# Patient Record
Sex: Female | Born: 1935 | Race: White | Hispanic: No | Marital: Married | Smoking: Former smoker
Health system: Southern US, Community
[De-identification: ages and names within clinical notes are randomized; demographics above are authoritative.]

## PROBLEM LIST (undated history)

## (undated) DIAGNOSIS — F419 Anxiety disorder, unspecified: Secondary | ICD-10-CM

## (undated) DIAGNOSIS — T508X5A Adverse effect of diagnostic agents, initial encounter: Secondary | ICD-10-CM

## (undated) DIAGNOSIS — I509 Heart failure, unspecified: Secondary | ICD-10-CM

## (undated) DIAGNOSIS — IMO0001 Reserved for inherently not codable concepts without codable children: Secondary | ICD-10-CM

## (undated) DIAGNOSIS — Z8719 Personal history of other diseases of the digestive system: Secondary | ICD-10-CM

## (undated) DIAGNOSIS — I10 Essential (primary) hypertension: Secondary | ICD-10-CM

## (undated) DIAGNOSIS — D649 Anemia, unspecified: Secondary | ICD-10-CM

## (undated) DIAGNOSIS — I219 Acute myocardial infarction, unspecified: Secondary | ICD-10-CM

## (undated) DIAGNOSIS — F329 Major depressive disorder, single episode, unspecified: Secondary | ICD-10-CM

## (undated) DIAGNOSIS — K219 Gastro-esophageal reflux disease without esophagitis: Secondary | ICD-10-CM

## (undated) DIAGNOSIS — F32A Depression, unspecified: Secondary | ICD-10-CM

## (undated) DIAGNOSIS — I2510 Atherosclerotic heart disease of native coronary artery without angina pectoris: Secondary | ICD-10-CM

## (undated) DIAGNOSIS — J069 Acute upper respiratory infection, unspecified: Secondary | ICD-10-CM

## (undated) DIAGNOSIS — M8580 Other specified disorders of bone density and structure, unspecified site: Secondary | ICD-10-CM

## (undated) DIAGNOSIS — R0602 Shortness of breath: Secondary | ICD-10-CM

## (undated) DIAGNOSIS — I2699 Other pulmonary embolism without acute cor pulmonale: Secondary | ICD-10-CM

## (undated) DIAGNOSIS — K551 Chronic vascular disorders of intestine: Secondary | ICD-10-CM

## (undated) DIAGNOSIS — I82409 Acute embolism and thrombosis of unspecified deep veins of unspecified lower extremity: Secondary | ICD-10-CM

## (undated) DIAGNOSIS — N141 Nephropathy induced by other drugs, medicaments and biological substances: Secondary | ICD-10-CM

## (undated) DIAGNOSIS — I480 Paroxysmal atrial fibrillation: Secondary | ICD-10-CM

## (undated) DIAGNOSIS — J189 Pneumonia, unspecified organism: Secondary | ICD-10-CM

## (undated) DIAGNOSIS — M1990 Unspecified osteoarthritis, unspecified site: Secondary | ICD-10-CM

## (undated) DIAGNOSIS — R51 Headache: Secondary | ICD-10-CM

## (undated) DIAGNOSIS — I499 Cardiac arrhythmia, unspecified: Secondary | ICD-10-CM

## (undated) DIAGNOSIS — K279 Peptic ulcer, site unspecified, unspecified as acute or chronic, without hemorrhage or perforation: Secondary | ICD-10-CM

## (undated) DIAGNOSIS — F99 Mental disorder, not otherwise specified: Secondary | ICD-10-CM

## (undated) DIAGNOSIS — K5790 Diverticulosis of intestine, part unspecified, without perforation or abscess without bleeding: Secondary | ICD-10-CM

## (undated) DIAGNOSIS — E785 Hyperlipidemia, unspecified: Secondary | ICD-10-CM

## (undated) DIAGNOSIS — I951 Orthostatic hypotension: Secondary | ICD-10-CM

## (undated) DIAGNOSIS — Z9889 Other specified postprocedural states: Secondary | ICD-10-CM

## (undated) DIAGNOSIS — I209 Angina pectoris, unspecified: Secondary | ICD-10-CM

## (undated) DIAGNOSIS — R112 Nausea with vomiting, unspecified: Secondary | ICD-10-CM

## (undated) DIAGNOSIS — I701 Atherosclerosis of renal artery: Secondary | ICD-10-CM

## (undated) DIAGNOSIS — Z5189 Encounter for other specified aftercare: Secondary | ICD-10-CM

## (undated) DIAGNOSIS — R011 Cardiac murmur, unspecified: Secondary | ICD-10-CM

## (undated) DIAGNOSIS — I150 Renovascular hypertension: Secondary | ICD-10-CM

## (undated) DIAGNOSIS — N1411 Contrast-induced nephropathy: Secondary | ICD-10-CM

## (undated) DIAGNOSIS — J449 Chronic obstructive pulmonary disease, unspecified: Secondary | ICD-10-CM

## (undated) DIAGNOSIS — I739 Peripheral vascular disease, unspecified: Secondary | ICD-10-CM

## (undated) HISTORY — DX: Chronic vascular disorders of intestine: K55.1

## (undated) HISTORY — DX: Chronic obstructive pulmonary disease, unspecified: J44.9

## (undated) HISTORY — PX: BREAST SURGERY: SHX581

## (undated) HISTORY — DX: Gastro-esophageal reflux disease without esophagitis: K21.9

## (undated) HISTORY — PX: TONSILLECTOMY: SUR1361

## (undated) HISTORY — DX: Orthostatic hypotension: I95.1

## (undated) HISTORY — DX: Paroxysmal atrial fibrillation: I48.0

## (undated) HISTORY — DX: Hyperlipidemia, unspecified: E78.5

## (undated) HISTORY — PX: CHOLECYSTECTOMY: SHX55

## (undated) HISTORY — DX: Personal history of other diseases of the digestive system: Z87.19

## (undated) HISTORY — DX: Atherosclerotic heart disease of native coronary artery without angina pectoris: I25.10

## (undated) HISTORY — DX: Nephropathy induced by other drugs, medicaments and biological substances: N14.1

## (undated) HISTORY — PX: APPENDECTOMY: SHX54

## (undated) HISTORY — DX: Acute embolism and thrombosis of unspecified deep veins of unspecified lower extremity: I82.409

## (undated) HISTORY — DX: Adverse effect of diagnostic agents, initial encounter: T50.8X5A

## (undated) HISTORY — DX: Other pulmonary embolism without acute cor pulmonale: I26.99

## (undated) HISTORY — DX: Renovascular hypertension: I15.0

## (undated) HISTORY — PX: ABDOMINAL HYSTERECTOMY: SHX81

## (undated) HISTORY — DX: Peripheral vascular disease, unspecified: I73.9

## (undated) HISTORY — PX: NM MYOVIEW LTD: HXRAD82

## (undated) HISTORY — DX: Contrast-induced nephropathy: N14.11

## (undated) HISTORY — DX: Peptic ulcer, site unspecified, unspecified as acute or chronic, without hemorrhage or perforation: K27.9

## (undated) HISTORY — PX: ESOPHAGOGASTRODUODENOSCOPY: SHX1529

## (undated) HISTORY — DX: Other specified disorders of bone density and structure, unspecified site: M85.80

## (undated) HISTORY — DX: Diverticulosis of intestine, part unspecified, without perforation or abscess without bleeding: K57.90

## (undated) HISTORY — DX: Atherosclerosis of renal artery: I70.1

## (undated) HISTORY — DX: Essential (primary) hypertension: I10

## (undated) HISTORY — PX: HERNIA REPAIR: SHX51

---

## 1995-04-02 HISTORY — PX: ABDOMINAL AORTIC ANEURYSM REPAIR: SUR1152

## 1997-06-28 ENCOUNTER — Encounter (HOSPITAL_COMMUNITY): Admission: RE | Admit: 1997-06-28 | Discharge: 1997-09-26 | Payer: Self-pay | Admitting: Internal Medicine

## 1997-10-24 ENCOUNTER — Encounter (HOSPITAL_COMMUNITY): Admission: RE | Admit: 1997-10-24 | Discharge: 1998-01-22 | Payer: Self-pay | Admitting: Internal Medicine

## 1998-09-27 ENCOUNTER — Encounter: Payer: Self-pay | Admitting: General Surgery

## 1998-09-29 ENCOUNTER — Observation Stay (HOSPITAL_COMMUNITY): Admission: RE | Admit: 1998-09-29 | Discharge: 1998-09-30 | Payer: Self-pay | Admitting: General Surgery

## 1998-09-29 ENCOUNTER — Encounter: Payer: Self-pay | Admitting: General Surgery

## 1998-09-29 ENCOUNTER — Encounter (INDEPENDENT_AMBULATORY_CARE_PROVIDER_SITE_OTHER): Payer: Self-pay | Admitting: Specialist

## 1999-04-02 HISTORY — PX: CATARACT EXTRACTION, BILATERAL: SHX1313

## 1999-10-15 ENCOUNTER — Encounter: Admission: RE | Admit: 1999-10-15 | Discharge: 1999-10-15 | Payer: Self-pay | Admitting: Internal Medicine

## 1999-10-15 ENCOUNTER — Encounter: Payer: Self-pay | Admitting: Internal Medicine

## 2000-03-05 ENCOUNTER — Ambulatory Visit (HOSPITAL_COMMUNITY): Admission: RE | Admit: 2000-03-05 | Discharge: 2000-03-05 | Payer: Self-pay | Admitting: Ophthalmology

## 2000-04-26 ENCOUNTER — Encounter: Payer: Self-pay | Admitting: Internal Medicine

## 2000-04-26 ENCOUNTER — Encounter: Admission: RE | Admit: 2000-04-26 | Discharge: 2000-04-26 | Payer: Self-pay | Admitting: Internal Medicine

## 2001-08-30 ENCOUNTER — Inpatient Hospital Stay (HOSPITAL_COMMUNITY): Admission: EM | Admit: 2001-08-30 | Discharge: 2001-09-12 | Payer: Self-pay

## 2001-09-03 HISTORY — PX: CARDIAC CATHETERIZATION: SHX172

## 2001-09-04 HISTORY — PX: CORONARY ARTERY BYPASS GRAFT: SHX141

## 2001-09-05 ENCOUNTER — Encounter: Payer: Self-pay | Admitting: Cardiothoracic Surgery

## 2001-09-06 ENCOUNTER — Encounter: Payer: Self-pay | Admitting: Cardiothoracic Surgery

## 2001-09-07 ENCOUNTER — Encounter: Payer: Self-pay | Admitting: Cardiothoracic Surgery

## 2001-09-08 ENCOUNTER — Encounter: Payer: Self-pay | Admitting: Cardiothoracic Surgery

## 2002-04-20 ENCOUNTER — Encounter (INDEPENDENT_AMBULATORY_CARE_PROVIDER_SITE_OTHER): Payer: Self-pay | Admitting: Internal Medicine

## 2002-04-20 ENCOUNTER — Other Ambulatory Visit: Admission: RE | Admit: 2002-04-20 | Discharge: 2002-04-20 | Payer: Self-pay | Admitting: Family Medicine

## 2002-12-09 ENCOUNTER — Encounter: Admission: RE | Admit: 2002-12-09 | Discharge: 2002-12-09 | Payer: Self-pay | Admitting: Family Medicine

## 2002-12-09 ENCOUNTER — Encounter: Payer: Self-pay | Admitting: Family Medicine

## 2003-01-05 ENCOUNTER — Encounter: Payer: Self-pay | Admitting: Neurological Surgery

## 2003-01-05 ENCOUNTER — Encounter: Admission: RE | Admit: 2003-01-05 | Discharge: 2003-01-05 | Payer: Self-pay | Admitting: Neurological Surgery

## 2003-11-21 ENCOUNTER — Encounter: Admission: RE | Admit: 2003-11-21 | Discharge: 2003-11-21 | Payer: Self-pay | Admitting: Family Medicine

## 2004-02-15 ENCOUNTER — Ambulatory Visit: Payer: Self-pay | Admitting: Family Medicine

## 2004-02-27 ENCOUNTER — Ambulatory Visit: Payer: Self-pay | Admitting: Cardiology

## 2004-02-27 ENCOUNTER — Ambulatory Visit: Payer: Self-pay | Admitting: Internal Medicine

## 2004-03-12 ENCOUNTER — Ambulatory Visit: Payer: Self-pay | Admitting: Family Medicine

## 2004-03-13 ENCOUNTER — Ambulatory Visit: Payer: Self-pay

## 2004-03-27 ENCOUNTER — Ambulatory Visit: Payer: Self-pay | Admitting: Internal Medicine

## 2004-04-11 ENCOUNTER — Ambulatory Visit: Payer: Self-pay | Admitting: Family Medicine

## 2004-04-24 ENCOUNTER — Ambulatory Visit: Payer: Self-pay | Admitting: Family Medicine

## 2004-05-07 ENCOUNTER — Ambulatory Visit: Payer: Self-pay | Admitting: Family Medicine

## 2004-05-14 ENCOUNTER — Ambulatory Visit: Payer: Self-pay | Admitting: Family Medicine

## 2004-05-21 ENCOUNTER — Ambulatory Visit: Payer: Self-pay | Admitting: Family Medicine

## 2004-06-07 ENCOUNTER — Ambulatory Visit: Payer: Self-pay | Admitting: Family Medicine

## 2004-06-11 ENCOUNTER — Ambulatory Visit: Payer: Self-pay | Admitting: Family Medicine

## 2004-06-26 ENCOUNTER — Ambulatory Visit: Payer: Self-pay

## 2004-06-27 ENCOUNTER — Ambulatory Visit: Payer: Self-pay | Admitting: *Deleted

## 2004-06-28 ENCOUNTER — Ambulatory Visit (HOSPITAL_COMMUNITY): Admission: RE | Admit: 2004-06-28 | Discharge: 2004-06-28 | Payer: Self-pay | Admitting: Vascular Surgery

## 2004-07-05 ENCOUNTER — Ambulatory Visit: Payer: Self-pay | Admitting: Family Medicine

## 2004-07-25 ENCOUNTER — Ambulatory Visit: Payer: Self-pay | Admitting: Family Medicine

## 2004-08-22 ENCOUNTER — Ambulatory Visit: Payer: Self-pay | Admitting: Family Medicine

## 2004-09-11 ENCOUNTER — Ambulatory Visit: Payer: Self-pay | Admitting: Family Medicine

## 2004-09-19 ENCOUNTER — Ambulatory Visit: Payer: Self-pay | Admitting: Family Medicine

## 2004-10-03 ENCOUNTER — Ambulatory Visit: Payer: Self-pay | Admitting: Family Medicine

## 2004-10-17 ENCOUNTER — Ambulatory Visit: Payer: Self-pay | Admitting: Family Medicine

## 2004-11-19 ENCOUNTER — Ambulatory Visit: Payer: Self-pay | Admitting: Family Medicine

## 2004-12-12 ENCOUNTER — Encounter (INDEPENDENT_AMBULATORY_CARE_PROVIDER_SITE_OTHER): Payer: Self-pay | Admitting: Internal Medicine

## 2004-12-12 ENCOUNTER — Ambulatory Visit: Payer: Self-pay | Admitting: Family Medicine

## 2004-12-12 LAB — CONVERTED CEMR LAB
Hgb A1c MFr Bld: 6 %
Microalbumin U total vol: 13.3 mg/L

## 2004-12-15 ENCOUNTER — Emergency Department (HOSPITAL_COMMUNITY): Admission: EM | Admit: 2004-12-15 | Discharge: 2004-12-15 | Payer: Self-pay | Admitting: Family Medicine

## 2004-12-17 ENCOUNTER — Ambulatory Visit: Payer: Self-pay | Admitting: Family Medicine

## 2004-12-21 ENCOUNTER — Ambulatory Visit: Payer: Self-pay | Admitting: Family Medicine

## 2005-01-14 ENCOUNTER — Ambulatory Visit: Payer: Self-pay | Admitting: Family Medicine

## 2005-01-16 ENCOUNTER — Ambulatory Visit: Payer: Self-pay | Admitting: Family Medicine

## 2005-01-28 ENCOUNTER — Ambulatory Visit: Payer: Self-pay | Admitting: Family Medicine

## 2005-02-11 ENCOUNTER — Ambulatory Visit: Payer: Self-pay | Admitting: Family Medicine

## 2005-02-25 ENCOUNTER — Ambulatory Visit: Payer: Self-pay | Admitting: Family Medicine

## 2005-03-05 ENCOUNTER — Ambulatory Visit: Payer: Self-pay | Admitting: Cardiology

## 2005-03-11 ENCOUNTER — Ambulatory Visit: Payer: Self-pay | Admitting: Family Medicine

## 2005-03-12 ENCOUNTER — Ambulatory Visit: Payer: Self-pay | Admitting: Family Medicine

## 2005-04-08 ENCOUNTER — Ambulatory Visit: Payer: Self-pay | Admitting: Family Medicine

## 2005-05-08 ENCOUNTER — Ambulatory Visit: Payer: Self-pay | Admitting: Family Medicine

## 2005-05-22 ENCOUNTER — Ambulatory Visit: Payer: Self-pay | Admitting: Family Medicine

## 2005-06-05 ENCOUNTER — Ambulatory Visit: Payer: Self-pay | Admitting: Family Medicine

## 2005-06-05 ENCOUNTER — Encounter (INDEPENDENT_AMBULATORY_CARE_PROVIDER_SITE_OTHER): Payer: Self-pay | Admitting: Internal Medicine

## 2005-06-05 LAB — CONVERTED CEMR LAB: Microalbumin U total vol: 50.6 mg/L

## 2005-06-10 ENCOUNTER — Ambulatory Visit: Payer: Self-pay | Admitting: Family Medicine

## 2005-06-19 ENCOUNTER — Ambulatory Visit: Payer: Self-pay | Admitting: Family Medicine

## 2005-07-02 ENCOUNTER — Ambulatory Visit: Payer: Self-pay | Admitting: Family Medicine

## 2005-07-16 ENCOUNTER — Ambulatory Visit: Payer: Self-pay | Admitting: Family Medicine

## 2005-07-31 ENCOUNTER — Ambulatory Visit: Payer: Self-pay | Admitting: Family Medicine

## 2005-08-14 ENCOUNTER — Ambulatory Visit: Payer: Self-pay | Admitting: Family Medicine

## 2005-09-02 ENCOUNTER — Ambulatory Visit: Payer: Self-pay | Admitting: Family Medicine

## 2005-09-02 ENCOUNTER — Encounter: Admission: RE | Admit: 2005-09-02 | Discharge: 2005-09-02 | Payer: Self-pay | Admitting: Family Medicine

## 2005-09-11 ENCOUNTER — Encounter (INDEPENDENT_AMBULATORY_CARE_PROVIDER_SITE_OTHER): Payer: Self-pay | Admitting: Internal Medicine

## 2005-09-11 ENCOUNTER — Ambulatory Visit: Payer: Self-pay | Admitting: Family Medicine

## 2005-09-13 ENCOUNTER — Ambulatory Visit: Payer: Self-pay | Admitting: Family Medicine

## 2005-09-25 ENCOUNTER — Ambulatory Visit: Payer: Self-pay | Admitting: Family Medicine

## 2005-10-14 ENCOUNTER — Ambulatory Visit: Payer: Self-pay | Admitting: Family Medicine

## 2005-10-23 ENCOUNTER — Ambulatory Visit: Payer: Self-pay | Admitting: Family Medicine

## 2005-10-30 ENCOUNTER — Encounter (INDEPENDENT_AMBULATORY_CARE_PROVIDER_SITE_OTHER): Payer: Self-pay | Admitting: Internal Medicine

## 2005-10-30 ENCOUNTER — Ambulatory Visit: Payer: Self-pay | Admitting: Family Medicine

## 2005-10-30 LAB — CONVERTED CEMR LAB: Microalbumin U total vol: 3.7 mg/L

## 2005-11-13 ENCOUNTER — Ambulatory Visit: Payer: Self-pay | Admitting: Family Medicine

## 2005-12-11 ENCOUNTER — Ambulatory Visit: Payer: Self-pay | Admitting: Family Medicine

## 2005-12-13 ENCOUNTER — Ambulatory Visit: Payer: Self-pay | Admitting: Family Medicine

## 2005-12-17 ENCOUNTER — Encounter: Admission: RE | Admit: 2005-12-17 | Discharge: 2005-12-17 | Payer: Self-pay | Admitting: Family Medicine

## 2006-01-08 ENCOUNTER — Ambulatory Visit: Payer: Self-pay | Admitting: Internal Medicine

## 2006-01-08 ENCOUNTER — Ambulatory Visit: Payer: Self-pay | Admitting: Family Medicine

## 2006-01-12 ENCOUNTER — Emergency Department (HOSPITAL_COMMUNITY): Admission: EM | Admit: 2006-01-12 | Discharge: 2006-01-12 | Payer: Self-pay | Admitting: Family Medicine

## 2006-01-15 ENCOUNTER — Ambulatory Visit: Payer: Self-pay | Admitting: Family Medicine

## 2006-01-15 ENCOUNTER — Inpatient Hospital Stay (HOSPITAL_COMMUNITY): Admission: EM | Admit: 2006-01-15 | Discharge: 2006-01-25 | Payer: Self-pay | Admitting: Emergency Medicine

## 2006-01-15 ENCOUNTER — Ambulatory Visit: Payer: Self-pay | Admitting: Cardiology

## 2006-01-16 ENCOUNTER — Encounter: Payer: Self-pay | Admitting: Cardiology

## 2006-01-16 ENCOUNTER — Encounter: Payer: Self-pay | Admitting: Vascular Surgery

## 2006-01-20 ENCOUNTER — Encounter: Payer: Self-pay | Admitting: Vascular Surgery

## 2006-02-04 ENCOUNTER — Ambulatory Visit: Payer: Self-pay | Admitting: Family Medicine

## 2006-02-06 ENCOUNTER — Ambulatory Visit: Payer: Self-pay | Admitting: Cardiology

## 2006-02-12 ENCOUNTER — Ambulatory Visit: Payer: Self-pay | Admitting: Gastroenterology

## 2006-02-13 ENCOUNTER — Ambulatory Visit: Payer: Self-pay | Admitting: Family Medicine

## 2006-02-17 ENCOUNTER — Ambulatory Visit: Payer: Self-pay | Admitting: Family Medicine

## 2006-02-18 ENCOUNTER — Ambulatory Visit: Payer: Self-pay | Admitting: Gastroenterology

## 2006-02-18 LAB — CONVERTED CEMR LAB
Fecal Occult Blood: NEGATIVE
OCCULT 1: NEGATIVE
OCCULT 2: NEGATIVE

## 2006-02-25 ENCOUNTER — Ambulatory Visit: Payer: Self-pay | Admitting: Cardiology

## 2006-02-27 ENCOUNTER — Ambulatory Visit: Payer: Self-pay

## 2006-03-05 ENCOUNTER — Ambulatory Visit: Payer: Self-pay | Admitting: Family Medicine

## 2006-03-06 ENCOUNTER — Ambulatory Visit: Payer: Self-pay | Admitting: Cardiovascular Disease

## 2006-03-13 ENCOUNTER — Encounter (INDEPENDENT_AMBULATORY_CARE_PROVIDER_SITE_OTHER): Payer: Self-pay | Admitting: *Deleted

## 2006-03-13 ENCOUNTER — Ambulatory Visit: Payer: Self-pay | Admitting: Gastroenterology

## 2006-03-17 ENCOUNTER — Ambulatory Visit: Payer: Self-pay | Admitting: Family Medicine

## 2006-03-17 ENCOUNTER — Encounter (INDEPENDENT_AMBULATORY_CARE_PROVIDER_SITE_OTHER): Payer: Self-pay | Admitting: Internal Medicine

## 2006-03-27 ENCOUNTER — Ambulatory Visit: Payer: Self-pay | Admitting: Family Medicine

## 2006-04-14 ENCOUNTER — Ambulatory Visit: Payer: Self-pay | Admitting: Cardiology

## 2006-04-14 LAB — CONVERTED CEMR LAB
Hemoglobin: 10.5 g/dL — ABNORMAL LOW (ref 12.0–15.0)
MCHC: 32.7 g/dL (ref 30.0–36.0)
MCV: 81.3 fL (ref 78.0–100.0)
Platelets: 249 10*3/uL (ref 150–400)
RBC: 3.95 M/uL (ref 3.87–5.11)

## 2006-04-15 ENCOUNTER — Emergency Department (HOSPITAL_COMMUNITY): Admission: EM | Admit: 2006-04-15 | Discharge: 2006-04-15 | Payer: Self-pay | Admitting: Emergency Medicine

## 2006-04-18 ENCOUNTER — Ambulatory Visit: Payer: Self-pay | Admitting: Cardiology

## 2006-04-18 LAB — CONVERTED CEMR LAB
CO2: 30 meq/L (ref 19–32)
Calcium: 8.9 mg/dL (ref 8.4–10.5)
GFR calc Af Amer: 79 mL/min
Glucose, Bld: 103 mg/dL — ABNORMAL HIGH (ref 70–99)

## 2006-04-21 ENCOUNTER — Encounter: Payer: Self-pay | Admitting: Cardiology

## 2006-04-21 ENCOUNTER — Ambulatory Visit: Payer: Self-pay

## 2006-04-24 ENCOUNTER — Ambulatory Visit: Payer: Self-pay | Admitting: Family Medicine

## 2006-04-24 LAB — CONVERTED CEMR LAB: Prothrombin Time: 31.2 s (ref 10.0–14.0)

## 2006-05-02 ENCOUNTER — Ambulatory Visit: Payer: Self-pay | Admitting: Family Medicine

## 2006-05-08 ENCOUNTER — Ambulatory Visit: Payer: Self-pay | Admitting: Cardiology

## 2006-05-08 LAB — CONVERTED CEMR LAB
Chloride: 103 meq/L (ref 96–112)
Creatinine, Ser: 0.7 mg/dL (ref 0.4–1.2)
Pro B Natriuretic peptide (BNP): 278 pg/mL — ABNORMAL HIGH (ref 0.0–100.0)
Sodium: 140 meq/L (ref 135–145)

## 2006-05-09 ENCOUNTER — Ambulatory Visit: Payer: Self-pay | Admitting: Family Medicine

## 2006-05-16 ENCOUNTER — Ambulatory Visit: Payer: Self-pay | Admitting: Cardiology

## 2006-05-16 LAB — CONVERTED CEMR LAB
BUN: 11 mg/dL (ref 6–23)
Chloride: 102 meq/L (ref 96–112)
Creatinine, Ser: 0.7 mg/dL (ref 0.4–1.2)
Sodium: 142 meq/L (ref 135–145)

## 2006-06-06 ENCOUNTER — Ambulatory Visit: Payer: Self-pay | Admitting: Family Medicine

## 2006-06-16 ENCOUNTER — Ambulatory Visit: Payer: Self-pay | Admitting: Family Medicine

## 2006-06-16 ENCOUNTER — Encounter (INDEPENDENT_AMBULATORY_CARE_PROVIDER_SITE_OTHER): Payer: Self-pay | Admitting: Internal Medicine

## 2006-06-16 LAB — CONVERTED CEMR LAB
Eosinophils Absolute: 0.2 10*3/uL (ref 0.0–0.6)
Eosinophils Relative: 2.7 % (ref 0.0–5.0)
GFR calc Af Amer: 126 mL/min
GFR calc non Af Amer: 104 mL/min
Glucose, Bld: 95 mg/dL (ref 70–99)
HCT: 37.8 % (ref 36.0–46.0)
Hemoglobin: 12.4 g/dL (ref 12.0–15.0)
Hgb A1c MFr Bld: 5.5 %
Hgb A1c MFr Bld: 5.5 % (ref 4.6–6.0)
Lymphocytes Relative: 20.2 % (ref 12.0–46.0)
MCV: 78.8 fL (ref 78.0–100.0)
Microalbumin U total vol: 1.2 mg/L
Neutro Abs: 4.4 10*3/uL (ref 1.4–7.7)
Neutrophils Relative %: 70 % (ref 43.0–77.0)
Sodium: 143 meq/L (ref 135–145)
WBC: 6.3 10*3/uL (ref 4.5–10.5)

## 2006-06-25 ENCOUNTER — Encounter (INDEPENDENT_AMBULATORY_CARE_PROVIDER_SITE_OTHER): Payer: Self-pay | Admitting: Internal Medicine

## 2006-06-25 DIAGNOSIS — E785 Hyperlipidemia, unspecified: Secondary | ICD-10-CM

## 2006-06-25 DIAGNOSIS — K279 Peptic ulcer, site unspecified, unspecified as acute or chronic, without hemorrhage or perforation: Secondary | ICD-10-CM | POA: Insufficient documentation

## 2006-06-25 DIAGNOSIS — E119 Type 2 diabetes mellitus without complications: Secondary | ICD-10-CM

## 2006-06-25 DIAGNOSIS — Z86718 Personal history of other venous thrombosis and embolism: Secondary | ICD-10-CM | POA: Insufficient documentation

## 2006-06-25 DIAGNOSIS — M899 Disorder of bone, unspecified: Secondary | ICD-10-CM | POA: Insufficient documentation

## 2006-06-25 DIAGNOSIS — I10 Essential (primary) hypertension: Secondary | ICD-10-CM | POA: Insufficient documentation

## 2006-06-25 DIAGNOSIS — I739 Peripheral vascular disease, unspecified: Secondary | ICD-10-CM | POA: Insufficient documentation

## 2006-06-25 DIAGNOSIS — M949 Disorder of cartilage, unspecified: Secondary | ICD-10-CM

## 2006-07-04 ENCOUNTER — Ambulatory Visit: Payer: Self-pay | Admitting: Family Medicine

## 2006-07-22 ENCOUNTER — Encounter: Payer: Self-pay | Admitting: *Deleted

## 2006-07-22 ENCOUNTER — Ambulatory Visit: Payer: Self-pay | Admitting: Internal Medicine

## 2006-07-30 ENCOUNTER — Ambulatory Visit: Payer: Self-pay | Admitting: Family Medicine

## 2006-07-30 LAB — CONVERTED CEMR LAB: Prothrombin Time: 23.7 s

## 2006-08-06 ENCOUNTER — Ambulatory Visit: Payer: Self-pay | Admitting: Family Medicine

## 2006-08-08 ENCOUNTER — Ambulatory Visit: Payer: Self-pay | Admitting: Family Medicine

## 2006-08-12 ENCOUNTER — Ambulatory Visit: Payer: Self-pay | Admitting: Cardiology

## 2006-08-12 LAB — CONVERTED CEMR LAB
Cholesterol: 87 mg/dL (ref 0–200)
HDL: 38.2 mg/dL — ABNORMAL LOW (ref >39.0)
LDL Cholesterol: 30 mg/dL (ref 0–99)

## 2006-08-13 ENCOUNTER — Ambulatory Visit: Payer: Self-pay | Admitting: Internal Medicine

## 2006-08-15 ENCOUNTER — Ambulatory Visit: Payer: Self-pay | Admitting: Internal Medicine

## 2006-08-15 LAB — CONVERTED CEMR LAB
INR: 2.6
Prothrombin Time: 19.5 s

## 2006-08-18 LAB — CONVERTED CEMR LAB: Prothrombin Time: 26.3 s

## 2006-08-19 ENCOUNTER — Telehealth (INDEPENDENT_AMBULATORY_CARE_PROVIDER_SITE_OTHER): Payer: Self-pay | Admitting: Internal Medicine

## 2006-08-20 ENCOUNTER — Ambulatory Visit: Payer: Self-pay | Admitting: Cardiology

## 2006-08-20 LAB — CONVERTED CEMR LAB
CO2: 33 meq/L — ABNORMAL HIGH (ref 19–32)
Calcium: 9.2 mg/dL (ref 8.4–10.5)
GFR calc Af Amer: 90 mL/min
GFR calc non Af Amer: 75 mL/min
Glucose, Bld: 87 mg/dL (ref 70–99)
Potassium: 4.7 meq/L (ref 3.5–5.1)
Sodium: 142 meq/L (ref 135–145)

## 2006-08-22 ENCOUNTER — Ambulatory Visit: Payer: Self-pay | Admitting: Internal Medicine

## 2006-09-03 ENCOUNTER — Encounter (INDEPENDENT_AMBULATORY_CARE_PROVIDER_SITE_OTHER): Payer: Self-pay | Admitting: *Deleted

## 2006-09-05 ENCOUNTER — Ambulatory Visit: Payer: Self-pay | Admitting: Family Medicine

## 2006-09-05 LAB — CONVERTED CEMR LAB: Prothrombin Time: 19.5 s

## 2006-09-09 ENCOUNTER — Encounter: Admission: RE | Admit: 2006-09-09 | Discharge: 2006-09-09 | Payer: Self-pay | Admitting: Family Medicine

## 2006-09-09 ENCOUNTER — Encounter (INDEPENDENT_AMBULATORY_CARE_PROVIDER_SITE_OTHER): Payer: Self-pay | Admitting: Internal Medicine

## 2006-09-10 ENCOUNTER — Encounter (INDEPENDENT_AMBULATORY_CARE_PROVIDER_SITE_OTHER): Payer: Self-pay | Admitting: Internal Medicine

## 2006-09-22 ENCOUNTER — Ambulatory Visit: Payer: Self-pay | Admitting: Family Medicine

## 2006-09-23 ENCOUNTER — Encounter (INDEPENDENT_AMBULATORY_CARE_PROVIDER_SITE_OTHER): Payer: Self-pay | Admitting: Internal Medicine

## 2006-10-02 ENCOUNTER — Ambulatory Visit: Payer: Self-pay | Admitting: Family Medicine

## 2006-10-02 ENCOUNTER — Ambulatory Visit: Payer: Self-pay | Admitting: Cardiology

## 2006-10-08 LAB — CONVERTED CEMR LAB
Basophils Relative: 0.2 % (ref 0.0–1.0)
CO2: 29 meq/L (ref 19–32)
Eosinophils Absolute: 0.1 10*3/uL (ref 0.0–0.6)
Eosinophils Relative: 1.2 % (ref 0.0–5.0)
GFR calc Af Amer: 105 mL/min
GFR calc non Af Amer: 87 mL/min
Glucose, Bld: 102 mg/dL — ABNORMAL HIGH (ref 70–99)
HCT: 35.7 % — ABNORMAL LOW (ref 36.0–46.0)
Hemoglobin: 11.7 g/dL — ABNORMAL LOW (ref 12.0–15.0)
Lymphocytes Relative: 19.9 % (ref 12.0–46.0)
MCV: 81.5 fL (ref 78.0–100.0)
Monocytes Absolute: 0.5 10*3/uL (ref 0.2–0.7)
Neutro Abs: 4 10*3/uL (ref 1.4–7.7)
Neutrophils Relative %: 70.6 % (ref 43.0–77.0)
Prothrombin Time: 17.7 s — ABNORMAL HIGH (ref 10.0–14.0)
Sodium: 140 meq/L (ref 135–145)
WBC: 5.7 10*3/uL (ref 4.5–10.5)

## 2006-10-14 ENCOUNTER — Telehealth (INDEPENDENT_AMBULATORY_CARE_PROVIDER_SITE_OTHER): Payer: Self-pay | Admitting: *Deleted

## 2006-10-15 ENCOUNTER — Ambulatory Visit: Payer: Self-pay | Admitting: Family Medicine

## 2006-10-15 DIAGNOSIS — K5732 Diverticulitis of large intestine without perforation or abscess without bleeding: Secondary | ICD-10-CM

## 2006-10-16 ENCOUNTER — Ambulatory Visit: Payer: Self-pay | Admitting: Otolaryngology

## 2006-10-17 ENCOUNTER — Telehealth (INDEPENDENT_AMBULATORY_CARE_PROVIDER_SITE_OTHER): Payer: Self-pay | Admitting: Internal Medicine

## 2006-10-31 ENCOUNTER — Ambulatory Visit: Payer: Self-pay | Admitting: Family Medicine

## 2006-11-11 ENCOUNTER — Ambulatory Visit: Payer: Self-pay | Admitting: Internal Medicine

## 2006-11-12 ENCOUNTER — Encounter (INDEPENDENT_AMBULATORY_CARE_PROVIDER_SITE_OTHER): Payer: Self-pay | Admitting: Internal Medicine

## 2006-11-18 LAB — CONVERTED CEMR LAB
Basophils Absolute: 0 10*3/uL (ref 0.0–0.1)
Chloride: 103 meq/L (ref 96–112)
Eosinophils Absolute: 0.2 10*3/uL (ref 0.0–0.6)
Eosinophils Relative: 3 % (ref 0.0–5.0)
GFR calc Af Amer: 90 mL/min
GFR calc non Af Amer: 75 mL/min
Glucose, Bld: 101 mg/dL — ABNORMAL HIGH (ref 70–99)
HCT: 36.8 % (ref 36.0–46.0)
Hgb A1c MFr Bld: 5.1 % (ref 4.6–6.0)
Lymphocytes Relative: 23.3 % (ref 12.0–46.0)
MCHC: 34.7 g/dL (ref 30.0–36.0)
MCV: 80.8 fL (ref 78.0–100.0)
Neutro Abs: 4.6 10*3/uL (ref 1.4–7.7)
Neutrophils Relative %: 64 % (ref 43.0–77.0)
Platelets: 251 10*3/uL (ref 150–400)
RBC: 4.55 M/uL (ref 3.87–5.11)
Sodium: 142 meq/L (ref 135–145)
WBC: 7.1 10*3/uL (ref 4.5–10.5)

## 2006-11-19 ENCOUNTER — Ambulatory Visit: Payer: Self-pay | Admitting: Cardiology

## 2006-12-02 ENCOUNTER — Ambulatory Visit: Payer: Self-pay | Admitting: Family Medicine

## 2006-12-08 ENCOUNTER — Ambulatory Visit: Payer: Self-pay | Admitting: Family Medicine

## 2006-12-16 ENCOUNTER — Ambulatory Visit: Payer: Self-pay | Admitting: Family Medicine

## 2006-12-30 ENCOUNTER — Ambulatory Visit: Payer: Self-pay | Admitting: Family Medicine

## 2006-12-31 LAB — CONVERTED CEMR LAB
INR: 2.2 — ABNORMAL HIGH (ref 0.8–1.0)
Prothrombin Time: 18.3 s — ABNORMAL HIGH (ref 10.9–13.3)

## 2007-01-19 ENCOUNTER — Ambulatory Visit: Payer: Self-pay | Admitting: Internal Medicine

## 2007-01-27 ENCOUNTER — Ambulatory Visit: Payer: Self-pay | Admitting: Family Medicine

## 2007-01-27 LAB — CONVERTED CEMR LAB
INR: 1.6
Prothrombin Time: 15.6 s

## 2007-02-10 ENCOUNTER — Ambulatory Visit: Payer: Self-pay | Admitting: Family Medicine

## 2007-02-10 LAB — CONVERTED CEMR LAB: INR: 1.8

## 2007-03-09 ENCOUNTER — Ambulatory Visit: Payer: Self-pay | Admitting: Family Medicine

## 2007-03-10 ENCOUNTER — Ambulatory Visit: Payer: Self-pay | Admitting: Cardiology

## 2007-03-11 LAB — CONVERTED CEMR LAB
ALT: 14 units/L (ref 0–35)
BUN: 9 mg/dL (ref 6–23)
CO2: 34 meq/L — ABNORMAL HIGH (ref 19–32)
Calcium: 9.1 mg/dL (ref 8.4–10.5)
GFR calc Af Amer: 105 mL/min
GFR calc non Af Amer: 87 mL/min
Hgb A1c MFr Bld: 5.6 % (ref 4.6–6.0)
LDL Cholesterol: 47 mg/dL (ref 0–99)
Potassium: 3.6 meq/L (ref 3.5–5.1)
Total CHOL/HDL Ratio: 1.9
Triglycerides: 52 mg/dL (ref 0–149)
VLDL: 10 mg/dL (ref 0–40)

## 2007-03-19 ENCOUNTER — Ambulatory Visit: Payer: Self-pay

## 2007-04-06 ENCOUNTER — Ambulatory Visit: Payer: Self-pay | Admitting: Internal Medicine

## 2007-04-06 LAB — CONVERTED CEMR LAB: Prothrombin Time: 19.5 s

## 2007-04-09 ENCOUNTER — Inpatient Hospital Stay (HOSPITAL_COMMUNITY): Admission: EM | Admit: 2007-04-09 | Discharge: 2007-04-15 | Payer: Self-pay | Admitting: Family Medicine

## 2007-04-09 ENCOUNTER — Ambulatory Visit: Payer: Self-pay | Admitting: Cardiology

## 2007-04-09 LAB — CONVERTED CEMR LAB
Basophils Relative: 0.1 % (ref 0.0–1.0)
CO2: 27 meq/L (ref 19–32)
Calcium: 9.4 mg/dL (ref 8.4–10.5)
Chloride: 103 meq/L (ref 96–112)
Eosinophils Relative: 0.5 % (ref 0.0–5.0)
Glucose, Bld: 122 mg/dL — ABNORMAL HIGH (ref 70–99)
HCT: 24.4 % — ABNORMAL LOW (ref 36.0–46.0)
Lymphocytes Relative: 9.9 % — ABNORMAL LOW (ref 12.0–46.0)
Neutro Abs: 6.8 10*3/uL (ref 1.4–7.7)
Neutrophils Relative %: 84.7 % — ABNORMAL HIGH (ref 43.0–77.0)
Platelets: 197 10*3/uL (ref 150–400)
Prothrombin Time: 21.9 s — ABNORMAL HIGH (ref 10.9–13.3)
RBC: 3.14 M/uL — ABNORMAL LOW (ref 3.87–5.11)
TSH: 2.59 microintl units/mL (ref 0.35–5.50)
WBC: 8 10*3/uL (ref 4.5–10.5)

## 2007-04-13 ENCOUNTER — Ambulatory Visit: Payer: Self-pay | Admitting: Internal Medicine

## 2007-04-15 ENCOUNTER — Encounter (INDEPENDENT_AMBULATORY_CARE_PROVIDER_SITE_OTHER): Payer: Self-pay | Admitting: Internal Medicine

## 2007-04-15 DIAGNOSIS — M109 Gout, unspecified: Secondary | ICD-10-CM

## 2007-04-23 ENCOUNTER — Encounter (INDEPENDENT_AMBULATORY_CARE_PROVIDER_SITE_OTHER): Payer: Self-pay | Admitting: Internal Medicine

## 2007-04-23 ENCOUNTER — Ambulatory Visit: Payer: Self-pay | Admitting: Family Medicine

## 2007-04-30 ENCOUNTER — Ambulatory Visit: Payer: Self-pay

## 2007-04-30 ENCOUNTER — Ambulatory Visit: Payer: Self-pay | Admitting: Cardiology

## 2007-04-30 LAB — CONVERTED CEMR LAB
Basophils Relative: 0 % (ref 0.0–1.0)
CO2: 32 meq/L (ref 19–32)
Eosinophils Relative: 2.6 % (ref 0.0–5.0)
GFR calc Af Amer: 47 mL/min
Glucose, Bld: 104 mg/dL — ABNORMAL HIGH (ref 70–99)
Hemoglobin: 11.3 g/dL — ABNORMAL LOW (ref 12.0–15.0)
Lymphocytes Relative: 19 % (ref 12.0–46.0)
Monocytes Absolute: 0.2 10*3/uL (ref 0.2–0.7)
Neutro Abs: 5.3 10*3/uL (ref 1.4–7.7)
Neutrophils Relative %: 74.9 % (ref 43.0–77.0)
Potassium: 4.2 meq/L (ref 3.5–5.1)
WBC: 7 10*3/uL (ref 4.5–10.5)

## 2007-05-04 ENCOUNTER — Ambulatory Visit: Payer: Self-pay | Admitting: Internal Medicine

## 2007-05-08 ENCOUNTER — Ambulatory Visit: Payer: Self-pay | Admitting: Family Medicine

## 2007-05-08 LAB — CONVERTED CEMR LAB: INR: 1.6

## 2007-05-15 ENCOUNTER — Ambulatory Visit: Payer: Self-pay | Admitting: Family Medicine

## 2007-05-15 LAB — CONVERTED CEMR LAB

## 2007-05-18 ENCOUNTER — Telehealth (INDEPENDENT_AMBULATORY_CARE_PROVIDER_SITE_OTHER): Payer: Self-pay | Admitting: Internal Medicine

## 2007-05-19 ENCOUNTER — Ambulatory Visit: Payer: Self-pay | Admitting: Family Medicine

## 2007-05-20 ENCOUNTER — Encounter (INDEPENDENT_AMBULATORY_CARE_PROVIDER_SITE_OTHER): Payer: Self-pay | Admitting: Internal Medicine

## 2007-05-21 LAB — CONVERTED CEMR LAB
Basophils Relative: 2 % — ABNORMAL HIGH (ref 0.0–1.0)
CO2: 32 meq/L (ref 19–32)
Calcium: 9.5 mg/dL (ref 8.4–10.5)
Creatinine, Ser: 1.7 mg/dL — ABNORMAL HIGH (ref 0.4–1.2)
Eosinophils Relative: 2.6 % (ref 0.0–5.0)
GFR calc Af Amer: 38 mL/min
Glucose, Bld: 123 mg/dL — ABNORMAL HIGH (ref 70–99)
Hemoglobin: 11.9 g/dL — ABNORMAL LOW (ref 12.0–15.0)
Lymphocytes Relative: 20.4 % (ref 12.0–46.0)
Neutro Abs: 5.4 10*3/uL (ref 1.4–7.7)
Platelets: 142 10*3/uL — ABNORMAL LOW (ref 150–400)
WBC: 7.7 10*3/uL (ref 4.5–10.5)

## 2007-05-29 ENCOUNTER — Ambulatory Visit: Payer: Self-pay | Admitting: Family Medicine

## 2007-05-29 LAB — CONVERTED CEMR LAB
INR: 1
Prothrombin Time: 12.5 s

## 2007-06-05 ENCOUNTER — Encounter (INDEPENDENT_AMBULATORY_CARE_PROVIDER_SITE_OTHER): Payer: Self-pay | Admitting: Internal Medicine

## 2007-06-08 ENCOUNTER — Encounter: Admission: RE | Admit: 2007-06-08 | Discharge: 2007-06-08 | Payer: Self-pay | Admitting: Family Medicine

## 2007-06-08 ENCOUNTER — Ambulatory Visit: Payer: Self-pay | Admitting: Family Medicine

## 2007-06-08 LAB — CONVERTED CEMR LAB
INR: 1.9
Prothrombin Time: 16.8 s

## 2007-06-10 ENCOUNTER — Telehealth (INDEPENDENT_AMBULATORY_CARE_PROVIDER_SITE_OTHER): Payer: Self-pay | Admitting: Internal Medicine

## 2007-06-16 ENCOUNTER — Ambulatory Visit: Payer: Self-pay | Admitting: Cardiology

## 2007-06-16 ENCOUNTER — Encounter: Payer: Self-pay | Admitting: Family Medicine

## 2007-06-16 ENCOUNTER — Telehealth: Payer: Self-pay | Admitting: Family Medicine

## 2007-06-16 ENCOUNTER — Encounter (INDEPENDENT_AMBULATORY_CARE_PROVIDER_SITE_OTHER): Payer: Self-pay | Admitting: Internal Medicine

## 2007-06-17 ENCOUNTER — Encounter (INDEPENDENT_AMBULATORY_CARE_PROVIDER_SITE_OTHER): Payer: Self-pay | Admitting: Internal Medicine

## 2007-06-23 ENCOUNTER — Telehealth (INDEPENDENT_AMBULATORY_CARE_PROVIDER_SITE_OTHER): Payer: Self-pay | Admitting: Internal Medicine

## 2007-06-26 ENCOUNTER — Inpatient Hospital Stay (HOSPITAL_COMMUNITY): Admission: EM | Admit: 2007-06-26 | Discharge: 2007-07-03 | Payer: Self-pay | Admitting: Emergency Medicine

## 2007-06-26 ENCOUNTER — Ambulatory Visit: Payer: Self-pay | Admitting: Internal Medicine

## 2007-06-30 ENCOUNTER — Ambulatory Visit: Payer: Self-pay | Admitting: Internal Medicine

## 2007-06-30 ENCOUNTER — Encounter (INDEPENDENT_AMBULATORY_CARE_PROVIDER_SITE_OTHER): Payer: Self-pay | Admitting: Internal Medicine

## 2007-06-30 ENCOUNTER — Encounter: Payer: Self-pay | Admitting: Internal Medicine

## 2007-07-01 ENCOUNTER — Encounter (INDEPENDENT_AMBULATORY_CARE_PROVIDER_SITE_OTHER): Payer: Self-pay | Admitting: Internal Medicine

## 2007-07-01 HISTORY — PX: VENA CAVA FILTER PLACEMENT: SHX1085

## 2007-07-02 ENCOUNTER — Ambulatory Visit: Payer: Self-pay | Admitting: Internal Medicine

## 2007-07-03 ENCOUNTER — Encounter (INDEPENDENT_AMBULATORY_CARE_PROVIDER_SITE_OTHER): Payer: Self-pay | Admitting: Internal Medicine

## 2007-07-08 ENCOUNTER — Ambulatory Visit: Payer: Self-pay | Admitting: Family Medicine

## 2007-07-08 LAB — CONVERTED CEMR LAB
Basophils Absolute: 0 10*3/uL (ref 0.0–0.1)
Chloride: 103 meq/L (ref 96–112)
Eosinophils Absolute: 0.1 10*3/uL (ref 0.0–0.7)
GFR calc Af Amer: 79 mL/min
GFR calc non Af Amer: 65 mL/min
MCHC: 32.3 g/dL (ref 30.0–36.0)
MCV: 87 fL (ref 78.0–100.0)
Neutrophils Relative %: 74.2 % (ref 43.0–77.0)
Platelets: 188 10*3/uL (ref 150–400)
Potassium: 4 meq/L (ref 3.5–5.1)
RDW: 16 % — ABNORMAL HIGH (ref 11.5–14.6)

## 2007-07-14 ENCOUNTER — Ambulatory Visit: Payer: Self-pay | Admitting: Family Medicine

## 2007-07-14 LAB — CONVERTED CEMR LAB

## 2007-07-21 ENCOUNTER — Ambulatory Visit: Payer: Self-pay | Admitting: Internal Medicine

## 2007-07-22 ENCOUNTER — Ambulatory Visit: Payer: Self-pay | Admitting: Family Medicine

## 2007-07-22 DIAGNOSIS — IMO0002 Reserved for concepts with insufficient information to code with codable children: Secondary | ICD-10-CM

## 2007-07-22 DIAGNOSIS — F329 Major depressive disorder, single episode, unspecified: Secondary | ICD-10-CM

## 2007-07-22 DIAGNOSIS — E1142 Type 2 diabetes mellitus with diabetic polyneuropathy: Secondary | ICD-10-CM | POA: Insufficient documentation

## 2007-07-23 ENCOUNTER — Telehealth (INDEPENDENT_AMBULATORY_CARE_PROVIDER_SITE_OTHER): Payer: Self-pay | Admitting: Internal Medicine

## 2007-07-23 LAB — CONVERTED CEMR LAB
Albumin: 3.5 g/dL (ref 3.5–5.2)
BUN: 11 mg/dL (ref 6–23)
Calcium: 9 mg/dL (ref 8.4–10.5)
GFR calc non Af Amer: 65 mL/min
Hgb A1c MFr Bld: 5.4 % (ref 4.6–6.0)
Phosphorus: 3.9 mg/dL (ref 2.3–4.6)
Potassium: 3.8 meq/L (ref 3.5–5.1)
Sodium: 145 meq/L (ref 135–145)

## 2007-07-24 ENCOUNTER — Ambulatory Visit: Payer: Self-pay

## 2007-07-24 ENCOUNTER — Encounter (INDEPENDENT_AMBULATORY_CARE_PROVIDER_SITE_OTHER): Payer: Self-pay | Admitting: Internal Medicine

## 2007-07-27 ENCOUNTER — Encounter: Payer: Self-pay | Admitting: Family Medicine

## 2007-07-27 ENCOUNTER — Telehealth (INDEPENDENT_AMBULATORY_CARE_PROVIDER_SITE_OTHER): Payer: Self-pay | Admitting: Internal Medicine

## 2007-07-27 ENCOUNTER — Ambulatory Visit: Payer: Self-pay | Admitting: Internal Medicine

## 2007-08-03 ENCOUNTER — Ambulatory Visit: Payer: Self-pay | Admitting: Internal Medicine

## 2007-08-03 ENCOUNTER — Ambulatory Visit: Payer: Self-pay | Admitting: Cardiology

## 2007-08-06 ENCOUNTER — Encounter: Payer: Self-pay | Admitting: Family Medicine

## 2007-08-10 ENCOUNTER — Ambulatory Visit: Payer: Self-pay | Admitting: Family Medicine

## 2007-08-10 LAB — CONVERTED CEMR LAB
Ketones, urine, test strip: NEGATIVE
Nitrite: NEGATIVE
Protein, U semiquant: NEGATIVE
Urobilinogen, UA: 0.2

## 2007-08-11 ENCOUNTER — Encounter (INDEPENDENT_AMBULATORY_CARE_PROVIDER_SITE_OTHER): Payer: Self-pay | Admitting: Internal Medicine

## 2007-08-17 ENCOUNTER — Encounter (INDEPENDENT_AMBULATORY_CARE_PROVIDER_SITE_OTHER): Payer: Self-pay | Admitting: Internal Medicine

## 2007-08-17 ENCOUNTER — Ambulatory Visit: Payer: Self-pay

## 2007-08-25 ENCOUNTER — Ambulatory Visit: Payer: Self-pay | Admitting: Family Medicine

## 2007-08-25 LAB — CONVERTED CEMR LAB
Bilirubin Urine: NEGATIVE
Blood in Urine, dipstick: NEGATIVE
Glucose, Urine, Semiquant: NEGATIVE
Protein, U semiquant: NEGATIVE
Specific Gravity, Urine: 1.01
Urobilinogen, UA: 0.2

## 2007-09-16 ENCOUNTER — Encounter: Payer: Self-pay | Admitting: Family Medicine

## 2007-09-24 ENCOUNTER — Telehealth (INDEPENDENT_AMBULATORY_CARE_PROVIDER_SITE_OTHER): Payer: Self-pay | Admitting: Internal Medicine

## 2007-10-01 ENCOUNTER — Encounter (INDEPENDENT_AMBULATORY_CARE_PROVIDER_SITE_OTHER): Payer: Self-pay | Admitting: Internal Medicine

## 2007-10-13 ENCOUNTER — Ambulatory Visit: Payer: Self-pay | Admitting: Family Medicine

## 2007-10-30 ENCOUNTER — Ambulatory Visit: Payer: Self-pay | Admitting: Cardiology

## 2007-10-30 ENCOUNTER — Ambulatory Visit: Payer: Self-pay | Admitting: Cardiovascular Disease

## 2007-10-30 LAB — CONVERTED CEMR LAB
Alkaline Phosphatase: 109 units/L (ref 39–117)
Basophils Absolute: 0 10*3/uL (ref 0.0–0.1)
Bilirubin, Direct: 0.1 mg/dL (ref 0.0–0.3)
GFR calc Af Amer: 79 mL/min
Glucose, Bld: 116 mg/dL — ABNORMAL HIGH (ref 70–99)
HDL: 49.9 mg/dL (ref >39.0)
LDL Cholesterol: 79 mg/dL (ref 0–99)
Lymphocytes Relative: 20.7 % (ref 12.0–46.0)
MCHC: 33.7 g/dL (ref 30.0–36.0)
Monocytes Absolute: 0.3 10*3/uL (ref 0.1–1.0)
Monocytes Relative: 5.6 % (ref 3.0–12.0)
Platelets: 152 10*3/uL (ref 150–400)
Potassium: 4.1 meq/L (ref 3.5–5.1)
RDW: 15.1 % — ABNORMAL HIGH (ref 11.5–14.6)
Sodium: 142 meq/L (ref 135–145)
Total Bilirubin: 1 mg/dL (ref 0.3–1.2)
Total CHOL/HDL Ratio: 3
Total Protein: 7.2 g/dL (ref 6.0–8.3)
Triglycerides: 101 mg/dL (ref 0–149)

## 2007-11-18 ENCOUNTER — Ambulatory Visit: Payer: Self-pay | Admitting: Internal Medicine

## 2007-11-18 ENCOUNTER — Inpatient Hospital Stay (HOSPITAL_COMMUNITY): Admission: EM | Admit: 2007-11-18 | Discharge: 2007-11-24 | Payer: Self-pay | Admitting: Emergency Medicine

## 2007-12-02 ENCOUNTER — Ambulatory Visit: Payer: Self-pay | Admitting: Cardiology

## 2007-12-02 LAB — CONVERTED CEMR LAB
Basophils Absolute: 0.1 10*3/uL (ref 0.0–0.1)
Basophils Relative: 0.8 % (ref 0.0–3.0)
Calcium: 9.3 mg/dL (ref 8.4–10.5)
Creatinine, Ser: 1.1 mg/dL (ref 0.4–1.2)
Eosinophils Absolute: 0.1 10*3/uL (ref 0.0–0.7)
GFR calc Af Amer: 62 mL/min
GFR calc non Af Amer: 52 mL/min
HCT: 37.7 % (ref 36.0–46.0)
Hemoglobin: 13 g/dL (ref 12.0–15.0)
Iron: 45 ug/dL (ref 42–145)
Lymphocytes Relative: 19.8 % (ref 12.0–46.0)
MCHC: 34.5 g/dL (ref 30.0–36.0)
MCV: 82.7 fL (ref 78.0–100.0)
Monocytes Absolute: 0.5 10*3/uL (ref 0.1–1.0)
Neutro Abs: 6 10*3/uL (ref 1.4–7.7)
RBC: 4.57 M/uL (ref 3.87–5.11)
RDW: 15.7 % — ABNORMAL HIGH (ref 11.5–14.6)
Sodium: 142 meq/L (ref 135–145)
Transferrin: 236.7 mg/dL (ref >=212.0)

## 2007-12-15 ENCOUNTER — Ambulatory Visit: Payer: Self-pay | Admitting: Cardiology

## 2007-12-28 ENCOUNTER — Ambulatory Visit: Payer: Self-pay | Admitting: Family Medicine

## 2008-02-10 DIAGNOSIS — I2510 Atherosclerotic heart disease of native coronary artery without angina pectoris: Secondary | ICD-10-CM | POA: Insufficient documentation

## 2008-02-10 DIAGNOSIS — I4891 Unspecified atrial fibrillation: Secondary | ICD-10-CM

## 2008-02-10 DIAGNOSIS — I5033 Acute on chronic diastolic (congestive) heart failure: Secondary | ICD-10-CM

## 2008-02-10 DIAGNOSIS — I701 Atherosclerosis of renal artery: Secondary | ICD-10-CM | POA: Insufficient documentation

## 2008-03-16 ENCOUNTER — Inpatient Hospital Stay (HOSPITAL_COMMUNITY): Admission: EM | Admit: 2008-03-16 | Discharge: 2008-03-20 | Payer: Self-pay | Admitting: Emergency Medicine

## 2008-03-16 ENCOUNTER — Ambulatory Visit: Payer: Self-pay | Admitting: Cardiology

## 2008-04-07 ENCOUNTER — Ambulatory Visit: Payer: Self-pay | Admitting: Cardiology

## 2008-04-07 LAB — CONVERTED CEMR LAB
BUN: 24 mg/dL — ABNORMAL HIGH (ref 6–23)
CO2: 32 meq/L (ref 19–32)
Calcium: 8.9 mg/dL (ref 8.4–10.5)
GFR calc Af Amer: 41 mL/min
Glucose, Bld: 89 mg/dL (ref 70–99)
Sodium: 145 meq/L (ref 135–145)

## 2008-04-11 ENCOUNTER — Ambulatory Visit: Payer: Self-pay | Admitting: Nurse Practitioner

## 2008-04-11 LAB — CONVERTED CEMR LAB
BUN: 25 mg/dL — ABNORMAL HIGH (ref 6–23)
CO2: 33 meq/L — ABNORMAL HIGH (ref 19–32)
Calcium: 8.7 mg/dL (ref 8.4–10.5)
GFR calc Af Amer: 41 mL/min
Glucose, Bld: 111 mg/dL — ABNORMAL HIGH (ref 70–99)
Sodium: 141 meq/L (ref 135–145)

## 2008-04-14 ENCOUNTER — Encounter: Payer: Self-pay | Admitting: Cardiology

## 2008-04-14 ENCOUNTER — Ambulatory Visit: Payer: Self-pay

## 2008-04-18 ENCOUNTER — Telehealth: Payer: Self-pay | Admitting: Family Medicine

## 2008-04-21 ENCOUNTER — Ambulatory Visit: Payer: Self-pay | Admitting: Internal Medicine

## 2008-04-21 ENCOUNTER — Ambulatory Visit: Payer: Self-pay | Admitting: Cardiology

## 2008-04-21 LAB — CONVERTED CEMR LAB
BUN: 28 mg/dL — ABNORMAL HIGH (ref 6–23)
CO2: 32 meq/L (ref 19–32)
Calcium: 9 mg/dL (ref 8.4–10.5)
Glucose, Bld: 100 mg/dL — ABNORMAL HIGH (ref 70–99)
Potassium: 4.4 meq/L (ref 3.5–5.1)
Sodium: 144 meq/L (ref 135–145)

## 2008-05-05 ENCOUNTER — Ambulatory Visit: Payer: Self-pay | Admitting: Cardiology

## 2008-05-05 LAB — CONVERTED CEMR LAB
BUN: 14 mg/dL (ref 6–23)
CO2: 33 meq/L — ABNORMAL HIGH (ref 19–32)
Glucose, Bld: 92 mg/dL (ref 70–99)
Potassium: 3.7 meq/L (ref 3.5–5.1)
Sodium: 142 meq/L (ref 135–145)

## 2008-05-18 ENCOUNTER — Ambulatory Visit: Payer: Self-pay

## 2008-05-20 ENCOUNTER — Ambulatory Visit: Payer: Self-pay

## 2008-05-20 ENCOUNTER — Ambulatory Visit: Payer: Self-pay | Admitting: Cardiology

## 2008-05-24 ENCOUNTER — Ambulatory Visit: Payer: Self-pay | Admitting: Internal Medicine

## 2008-05-26 ENCOUNTER — Telehealth (INDEPENDENT_AMBULATORY_CARE_PROVIDER_SITE_OTHER): Payer: Self-pay | Admitting: Internal Medicine

## 2008-05-26 ENCOUNTER — Encounter (INDEPENDENT_AMBULATORY_CARE_PROVIDER_SITE_OTHER): Payer: Self-pay | Admitting: Internal Medicine

## 2008-05-27 ENCOUNTER — Emergency Department (HOSPITAL_COMMUNITY): Admission: EM | Admit: 2008-05-27 | Discharge: 2008-05-28 | Payer: Self-pay | Admitting: Emergency Medicine

## 2008-05-29 ENCOUNTER — Encounter: Admission: RE | Admit: 2008-05-29 | Discharge: 2008-05-29 | Payer: Self-pay | Admitting: Family Medicine

## 2008-05-31 ENCOUNTER — Telehealth (INDEPENDENT_AMBULATORY_CARE_PROVIDER_SITE_OTHER): Payer: Self-pay | Admitting: Internal Medicine

## 2008-06-01 ENCOUNTER — Ambulatory Visit: Payer: Self-pay | Admitting: Cardiovascular Disease

## 2008-06-01 LAB — CONVERTED CEMR LAB
Creatinine,U: 28.5 mg/dL
Hgb A1c MFr Bld: 5.6 % (ref 4.6–6.0)
Microalb Creat Ratio: 101.8 mg/g — ABNORMAL HIGH (ref 0.0–30.0)
Microalb, Ur: 2.9 mg/dL — ABNORMAL HIGH (ref 0.0–1.9)
Sed Rate: 19 mm/hr (ref 0–22)

## 2008-06-02 ENCOUNTER — Telehealth (INDEPENDENT_AMBULATORY_CARE_PROVIDER_SITE_OTHER): Payer: Self-pay | Admitting: Internal Medicine

## 2008-06-10 ENCOUNTER — Telehealth (INDEPENDENT_AMBULATORY_CARE_PROVIDER_SITE_OTHER): Payer: Self-pay | Admitting: *Deleted

## 2008-06-17 ENCOUNTER — Ambulatory Visit: Payer: Self-pay | Admitting: Family Medicine

## 2008-06-19 LAB — CONVERTED CEMR LAB
AST: 22 units/L (ref 0–37)
Alkaline Phosphatase: 92 units/L (ref 39–117)
Basophils Absolute: 0 10*3/uL (ref 0.0–0.1)
Bilirubin, Direct: 0 mg/dL (ref 0.0–0.3)
CO2: 33 meq/L — ABNORMAL HIGH (ref 19–32)
Calcium: 8.9 mg/dL (ref 8.4–10.5)
Chloride: 104 meq/L (ref 96–112)
Lymphocytes Relative: 19.5 % (ref 12.0–46.0)
Lymphs Abs: 1.2 10*3/uL (ref 0.7–4.0)
Monocytes Relative: 5.6 % (ref 3.0–12.0)
Neutrophils Relative %: 73.4 % (ref 43.0–77.0)
Platelets: 133 10*3/uL — ABNORMAL LOW (ref 150.0–400.0)
Potassium: 4.4 meq/L (ref 3.5–5.1)
RDW: 15.9 % — ABNORMAL HIGH (ref 11.5–14.6)
Sodium: 144 meq/L (ref 135–145)
Total Protein: 6.4 g/dL (ref 6.0–8.3)
WBC: 6 10*3/uL (ref 4.5–10.5)

## 2008-06-20 ENCOUNTER — Encounter (INDEPENDENT_AMBULATORY_CARE_PROVIDER_SITE_OTHER): Payer: Self-pay | Admitting: Internal Medicine

## 2008-06-21 ENCOUNTER — Encounter: Payer: Self-pay | Admitting: Cardiology

## 2008-06-21 ENCOUNTER — Ambulatory Visit: Payer: Self-pay | Admitting: Cardiology

## 2008-06-27 ENCOUNTER — Encounter: Payer: Self-pay | Admitting: Internal Medicine

## 2008-06-27 ENCOUNTER — Ambulatory Visit: Payer: Self-pay | Admitting: Internal Medicine

## 2008-06-27 ENCOUNTER — Inpatient Hospital Stay (HOSPITAL_COMMUNITY): Admission: EM | Admit: 2008-06-27 | Discharge: 2008-07-04 | Payer: Self-pay | Admitting: Emergency Medicine

## 2008-06-27 ENCOUNTER — Encounter (INDEPENDENT_AMBULATORY_CARE_PROVIDER_SITE_OTHER): Payer: Self-pay | Admitting: Internal Medicine

## 2008-06-27 DIAGNOSIS — G43009 Migraine without aura, not intractable, without status migrainosus: Secondary | ICD-10-CM | POA: Insufficient documentation

## 2008-07-18 ENCOUNTER — Inpatient Hospital Stay (HOSPITAL_COMMUNITY): Admission: EM | Admit: 2008-07-18 | Discharge: 2008-07-21 | Payer: Self-pay | Admitting: Emergency Medicine

## 2008-07-18 ENCOUNTER — Ambulatory Visit: Payer: Self-pay | Admitting: Internal Medicine

## 2008-07-21 ENCOUNTER — Encounter (INDEPENDENT_AMBULATORY_CARE_PROVIDER_SITE_OTHER): Payer: Self-pay | Admitting: Internal Medicine

## 2008-07-22 ENCOUNTER — Ambulatory Visit: Payer: Self-pay | Admitting: Cardiology

## 2008-08-03 ENCOUNTER — Encounter: Payer: Self-pay | Admitting: Cardiology

## 2008-08-10 ENCOUNTER — Encounter: Payer: Self-pay | Admitting: Cardiology

## 2008-08-23 ENCOUNTER — Ambulatory Visit: Payer: Self-pay | Admitting: Internal Medicine

## 2008-08-24 LAB — CONVERTED CEMR LAB
BUN: 27 mg/dL — ABNORMAL HIGH (ref 6–23)
GFR calc non Af Amer: 35.97 mL/min (ref >60)
Glucose, Bld: 96 mg/dL (ref 70–99)
Potassium: 5.2 meq/L — ABNORMAL HIGH (ref 3.5–5.1)

## 2008-08-30 ENCOUNTER — Ambulatory Visit: Payer: Self-pay | Admitting: Nurse Practitioner

## 2008-08-30 ENCOUNTER — Ambulatory Visit: Payer: Self-pay | Admitting: Cardiology

## 2008-09-27 ENCOUNTER — Encounter (INDEPENDENT_AMBULATORY_CARE_PROVIDER_SITE_OTHER): Payer: Self-pay | Admitting: Internal Medicine

## 2008-09-27 ENCOUNTER — Telehealth: Payer: Self-pay | Admitting: Cardiology

## 2008-10-07 ENCOUNTER — Ambulatory Visit: Payer: Self-pay | Admitting: Internal Medicine

## 2008-10-07 LAB — CONVERTED CEMR LAB: Hgb A1c MFr Bld: 5.3 % (ref 4.6–6.5)

## 2008-10-31 ENCOUNTER — Ambulatory Visit: Payer: Self-pay | Admitting: Cardiology

## 2008-11-07 ENCOUNTER — Ambulatory Visit: Payer: Self-pay | Admitting: Cardiology

## 2008-11-07 LAB — CONVERTED CEMR LAB
Calcium: 8.6 mg/dL (ref 8.4–10.5)
GFR calc non Af Amer: 51.41 mL/min (ref >60)
Sodium: 148 meq/L — ABNORMAL HIGH (ref 135–145)

## 2008-11-17 ENCOUNTER — Ambulatory Visit: Payer: Self-pay

## 2008-11-17 ENCOUNTER — Encounter: Payer: Self-pay | Admitting: Cardiovascular Disease

## 2008-11-17 HISTORY — PX: OTHER SURGICAL HISTORY: SHX169

## 2008-11-22 ENCOUNTER — Ambulatory Visit: Payer: Self-pay | Admitting: Family Medicine

## 2008-11-24 ENCOUNTER — Ambulatory Visit: Payer: Self-pay | Admitting: Family Medicine

## 2008-12-02 ENCOUNTER — Ambulatory Visit: Payer: Self-pay | Admitting: Family Medicine

## 2008-12-02 ENCOUNTER — Telehealth: Payer: Self-pay | Admitting: Cardiology

## 2008-12-12 ENCOUNTER — Telehealth (INDEPENDENT_AMBULATORY_CARE_PROVIDER_SITE_OTHER): Payer: Self-pay | Admitting: Internal Medicine

## 2008-12-13 ENCOUNTER — Ambulatory Visit: Payer: Self-pay | Admitting: Family Medicine

## 2008-12-14 ENCOUNTER — Ambulatory Visit: Payer: Self-pay | Admitting: Family Medicine

## 2008-12-20 ENCOUNTER — Ambulatory Visit: Payer: Self-pay | Admitting: Family Medicine

## 2008-12-20 LAB — CONVERTED CEMR LAB
ALT: 14 units/L (ref 0–35)
BUN: 16 mg/dL (ref 6–23)
Basophils Relative: 0.5 % (ref 0.0–3.0)
Chloride: 104 meq/L (ref 96–112)
Creatinine, Ser: 1.1 mg/dL (ref 0.4–1.2)
Eosinophils Absolute: 0.1 10*3/uL (ref 0.0–0.7)
Glucose, Bld: 90 mg/dL (ref 70–99)
HDL: 40.7 mg/dL (ref >39.00)
Hemoglobin: 10 g/dL — ABNORMAL LOW (ref 12.0–15.0)
Lymphocytes Relative: 21.3 % (ref 12.0–46.0)
MCHC: 32.4 g/dL (ref 30.0–36.0)
MCV: 84.2 fL (ref 78.0–100.0)
Neutro Abs: 3.8 10*3/uL (ref 1.4–7.7)
RBC: 3.67 M/uL — ABNORMAL LOW (ref 3.87–5.11)
Triglycerides: 75 mg/dL (ref 0.0–149.0)

## 2008-12-26 ENCOUNTER — Ambulatory Visit: Payer: Self-pay | Admitting: Cardiovascular Disease

## 2008-12-30 ENCOUNTER — Ambulatory Visit: Payer: Self-pay | Admitting: Family Medicine

## 2009-01-05 ENCOUNTER — Ambulatory Visit: Payer: Self-pay | Admitting: Family Medicine

## 2009-01-05 LAB — CONVERTED CEMR LAB: OCCULT 1: NEGATIVE

## 2009-01-06 ENCOUNTER — Encounter: Payer: Self-pay | Admitting: Family Medicine

## 2009-02-14 ENCOUNTER — Encounter: Payer: Self-pay | Admitting: Cardiology

## 2009-02-20 ENCOUNTER — Ambulatory Visit: Payer: Self-pay | Admitting: Family Medicine

## 2009-02-28 ENCOUNTER — Encounter (INDEPENDENT_AMBULATORY_CARE_PROVIDER_SITE_OTHER): Payer: Self-pay | Admitting: Internal Medicine

## 2009-02-28 ENCOUNTER — Ambulatory Visit: Payer: Self-pay | Admitting: Family Medicine

## 2009-03-06 ENCOUNTER — Ambulatory Visit: Payer: Self-pay | Admitting: Cardiology

## 2009-03-10 ENCOUNTER — Encounter: Payer: Self-pay | Admitting: Cardiology

## 2009-03-10 LAB — CONVERTED CEMR LAB
BUN: 14 mg/dL (ref 6–23)
Basophils Absolute: 0.1 10*3/uL (ref 0.0–0.1)
Basophils Relative: 0.8 % (ref 0.0–3.0)
CO2: 33 meq/L — ABNORMAL HIGH (ref 19–32)
Chloride: 101 meq/L (ref 96–112)
Eosinophils Absolute: 0.2 10*3/uL (ref 0.0–0.7)
GFR calc non Af Amer: 57.34 mL/min (ref >60)
Glucose, Bld: 98 mg/dL (ref 70–99)
HCT: 34.7 % — ABNORMAL LOW (ref 36.0–46.0)
Hemoglobin: 11.5 g/dL — ABNORMAL LOW (ref 12.0–15.0)
Lymphs Abs: 1.3 10*3/uL (ref 0.7–4.0)
MCHC: 33.2 g/dL (ref 30.0–36.0)
MCV: 85.5 fL (ref 78.0–100.0)
Monocytes Absolute: 0.5 10*3/uL (ref 0.1–1.0)
Neutro Abs: 4.3 10*3/uL (ref 1.4–7.7)
Potassium: 4.1 meq/L (ref 3.5–5.1)
RDW: 14.4 % (ref 11.5–14.6)

## 2009-03-21 ENCOUNTER — Telehealth: Payer: Self-pay | Admitting: Cardiology

## 2009-03-22 ENCOUNTER — Ambulatory Visit: Payer: Self-pay | Admitting: Cardiology

## 2009-03-30 ENCOUNTER — Telehealth: Payer: Self-pay | Admitting: Cardiology

## 2009-03-30 LAB — CONVERTED CEMR LAB
BUN: 19 mg/dL (ref 6–23)
Calcium: 8.6 mg/dL (ref 8.4–10.5)
Creatinine, Ser: 1.1 mg/dL (ref 0.4–1.2)
Pro B Natriuretic peptide (BNP): 285 pg/mL — ABNORMAL HIGH (ref 0.0–100.0)

## 2009-04-03 ENCOUNTER — Encounter: Payer: Self-pay | Admitting: Cardiology

## 2009-04-03 ENCOUNTER — Telehealth (INDEPENDENT_AMBULATORY_CARE_PROVIDER_SITE_OTHER): Payer: Self-pay | Admitting: *Deleted

## 2009-04-05 ENCOUNTER — Telehealth (INDEPENDENT_AMBULATORY_CARE_PROVIDER_SITE_OTHER): Payer: Self-pay | Admitting: Internal Medicine

## 2009-04-05 ENCOUNTER — Telehealth: Payer: Self-pay | Admitting: Cardiology

## 2009-04-13 ENCOUNTER — Ambulatory Visit: Payer: Self-pay | Admitting: Family Medicine

## 2009-04-19 ENCOUNTER — Encounter: Payer: Self-pay | Admitting: Family Medicine

## 2009-04-20 ENCOUNTER — Encounter: Payer: Self-pay | Admitting: Family Medicine

## 2009-04-21 ENCOUNTER — Ambulatory Visit: Payer: Self-pay | Admitting: Family Medicine

## 2009-05-14 ENCOUNTER — Emergency Department (HOSPITAL_COMMUNITY): Admission: EM | Admit: 2009-05-14 | Discharge: 2009-05-14 | Payer: Self-pay | Admitting: Emergency Medicine

## 2009-05-24 ENCOUNTER — Ambulatory Visit: Payer: Self-pay | Admitting: Family Medicine

## 2009-06-19 ENCOUNTER — Ambulatory Visit: Payer: Self-pay | Admitting: Family Medicine

## 2009-06-21 ENCOUNTER — Encounter: Payer: Self-pay | Admitting: Family Medicine

## 2009-06-23 ENCOUNTER — Encounter: Payer: Self-pay | Admitting: Family Medicine

## 2009-07-05 ENCOUNTER — Ambulatory Visit: Payer: Self-pay | Admitting: Cardiology

## 2009-07-06 ENCOUNTER — Encounter: Payer: Self-pay | Admitting: Family Medicine

## 2009-07-11 ENCOUNTER — Ambulatory Visit: Payer: Self-pay | Admitting: Cardiovascular Disease

## 2009-07-11 LAB — CONVERTED CEMR LAB
Calcium: 8.6 mg/dL (ref 8.4–10.5)
Creatinine, Ser: 1.2 mg/dL (ref 0.4–1.2)
GFR calc non Af Amer: 46.42 mL/min (ref >60)
Glucose, Bld: 97 mg/dL (ref 70–99)
Sodium: 144 meq/L (ref 135–145)

## 2009-07-20 ENCOUNTER — Ambulatory Visit: Payer: Self-pay

## 2009-07-20 ENCOUNTER — Encounter: Payer: Self-pay | Admitting: Cardiovascular Disease

## 2009-07-27 ENCOUNTER — Ambulatory Visit: Payer: Self-pay | Admitting: Family Medicine

## 2009-08-04 ENCOUNTER — Ambulatory Visit: Payer: Self-pay | Admitting: Family Medicine

## 2009-08-21 ENCOUNTER — Telehealth: Payer: Self-pay | Admitting: Cardiology

## 2009-09-19 ENCOUNTER — Telehealth: Payer: Self-pay | Admitting: Cardiology

## 2009-09-20 ENCOUNTER — Ambulatory Visit: Payer: Self-pay | Admitting: Family Medicine

## 2009-09-20 LAB — CONVERTED CEMR LAB
BUN: 20 mg/dL (ref 6–23)
Chloride: 103 meq/L (ref 96–112)
Creatinine, Ser: 1.1 mg/dL (ref 0.4–1.2)
GFR calc non Af Amer: 49.22 mL/min (ref >60)
Potassium: 5.1 meq/L (ref 3.5–5.1)

## 2009-09-21 ENCOUNTER — Ambulatory Visit: Payer: Self-pay | Admitting: Internal Medicine

## 2009-10-06 ENCOUNTER — Encounter (INDEPENDENT_AMBULATORY_CARE_PROVIDER_SITE_OTHER): Payer: Self-pay | Admitting: *Deleted

## 2009-10-12 ENCOUNTER — Encounter: Payer: Self-pay | Admitting: Family Medicine

## 2009-10-31 ENCOUNTER — Ambulatory Visit: Payer: Self-pay | Admitting: Cardiology

## 2009-11-01 ENCOUNTER — Encounter: Payer: Self-pay | Admitting: Cardiovascular Disease

## 2009-11-01 ENCOUNTER — Ambulatory Visit: Payer: Self-pay

## 2009-11-01 ENCOUNTER — Ambulatory Visit: Payer: Self-pay | Admitting: Family Medicine

## 2009-11-03 ENCOUNTER — Ambulatory Visit: Payer: Self-pay | Admitting: Family Medicine

## 2009-11-03 LAB — CONVERTED CEMR LAB
Basophils Absolute: 0 10*3/uL (ref 0.0–0.1)
Basophils Relative: 0.2 % (ref 0.0–3.0)
Hemoglobin: 8.8 g/dL — ABNORMAL LOW (ref 12.0–15.0)
Iron: 21 ug/dL — ABNORMAL LOW (ref 42–145)
Lymphocytes Relative: 18.4 % (ref 12.0–46.0)
Monocytes Relative: 6.1 % (ref 3.0–12.0)
Neutro Abs: 6.3 10*3/uL (ref 1.4–7.7)
RBC: 3.54 M/uL — ABNORMAL LOW (ref 3.87–5.11)
Saturation Ratios: 5.1 % — ABNORMAL LOW (ref 20.0–50.0)
Transferrin: 296.4 mg/dL (ref 212.0–360.0)

## 2009-11-06 ENCOUNTER — Inpatient Hospital Stay (HOSPITAL_COMMUNITY): Admission: AD | Admit: 2009-11-06 | Discharge: 2009-11-09 | Payer: Self-pay | Admitting: Internal Medicine

## 2009-11-06 ENCOUNTER — Ambulatory Visit: Payer: Self-pay | Admitting: Family Medicine

## 2009-11-06 ENCOUNTER — Telehealth: Payer: Self-pay | Admitting: Nurse Practitioner

## 2009-11-06 ENCOUNTER — Ambulatory Visit: Payer: Self-pay | Admitting: Internal Medicine

## 2009-11-06 ENCOUNTER — Encounter: Payer: Self-pay | Admitting: Gastroenterology

## 2009-11-06 DIAGNOSIS — I5022 Chronic systolic (congestive) heart failure: Secondary | ICD-10-CM | POA: Insufficient documentation

## 2009-11-06 LAB — CONVERTED CEMR LAB
OCCULT 1: POSITIVE
OCCULT 2: POSITIVE
OCCULT 3: NEGATIVE

## 2009-11-07 ENCOUNTER — Encounter (INDEPENDENT_AMBULATORY_CARE_PROVIDER_SITE_OTHER): Payer: Self-pay | Admitting: *Deleted

## 2009-11-07 ENCOUNTER — Encounter: Payer: Self-pay | Admitting: Internal Medicine

## 2009-11-08 ENCOUNTER — Encounter: Payer: Self-pay | Admitting: Internal Medicine

## 2009-11-09 ENCOUNTER — Telehealth: Payer: Self-pay | Admitting: Internal Medicine

## 2009-11-09 LAB — CONVERTED CEMR LAB
Chloride: 102 meq/L (ref 96–112)
Creatinine, Ser: 1.4 mg/dL — ABNORMAL HIGH (ref 0.4–1.2)
GFR calc non Af Amer: 40.48 mL/min (ref >60)
Potassium: 4.5 meq/L (ref 3.5–5.1)

## 2009-11-13 ENCOUNTER — Telehealth: Payer: Self-pay | Admitting: Family Medicine

## 2009-11-13 ENCOUNTER — Telehealth: Payer: Self-pay | Admitting: Cardiology

## 2009-11-15 ENCOUNTER — Telehealth: Payer: Self-pay | Admitting: Family Medicine

## 2009-11-15 ENCOUNTER — Ambulatory Visit: Payer: Self-pay | Admitting: Family Medicine

## 2009-11-16 ENCOUNTER — Ambulatory Visit: Payer: Self-pay | Admitting: Internal Medicine

## 2009-11-16 LAB — CONVERTED CEMR LAB
BUN: 26 mg/dL — ABNORMAL HIGH (ref 6–23)
Basophils Absolute: 0 10*3/uL (ref 0.0–0.1)
Basophils Relative: 0.3 % (ref 0.0–3.0)
CO2: 30 meq/L (ref 19–32)
Calcium: 9.1 mg/dL (ref 8.4–10.5)
Chloride: 101 meq/L (ref 96–112)
Creatinine, Ser: 1.4 mg/dL — ABNORMAL HIGH (ref 0.4–1.2)
Eosinophils Absolute: 0.1 10*3/uL (ref 0.0–0.7)
Eosinophils Relative: 0.7 % (ref 0.0–5.0)
GFR calc non Af Amer: 38.19 mL/min (ref >60)
Glucose, Bld: 99 mg/dL (ref 70–99)
HCT: 34.5 % — ABNORMAL LOW (ref 36.0–46.0)
Hemoglobin: 11 g/dL — ABNORMAL LOW (ref 12.0–15.0)
Lymphocytes Relative: 12.8 % (ref 12.0–46.0)
Lymphs Abs: 1 10*3/uL (ref 0.7–4.0)
MCHC: 31.9 g/dL (ref 30.0–36.0)
MCV: 81.7 fL (ref 78.0–100.0)
Monocytes Absolute: 0.4 10*3/uL (ref 0.1–1.0)
Monocytes Relative: 5.3 % (ref 3.0–12.0)
Neutro Abs: 6.6 10*3/uL (ref 1.4–7.7)
Neutrophils Relative %: 80.9 % — ABNORMAL HIGH (ref 43.0–77.0)
Platelets: 139 10*3/uL — ABNORMAL LOW (ref 150.0–400.0)
Potassium: 4.9 meq/L (ref 3.5–5.1)
RBC: 4.22 M/uL (ref 3.87–5.11)
RDW: 21.2 % — ABNORMAL HIGH (ref 11.5–14.6)
Sodium: 145 meq/L (ref 135–145)
WBC: 8.2 10*3/uL (ref 4.5–10.5)

## 2009-11-17 ENCOUNTER — Encounter: Payer: Self-pay | Admitting: Family Medicine

## 2009-11-17 ENCOUNTER — Ambulatory Visit: Payer: Self-pay | Admitting: Family Medicine

## 2009-11-20 ENCOUNTER — Ambulatory Visit: Payer: Self-pay | Admitting: Family Medicine

## 2009-11-20 LAB — CONVERTED CEMR LAB
BUN: 24 mg/dL — ABNORMAL HIGH (ref 6–23)
CO2: 30 meq/L (ref 19–32)
Calcium: 8.7 mg/dL (ref 8.4–10.5)
Chloride: 101 meq/L (ref 96–112)
Creatinine, Ser: 1.24 mg/dL — ABNORMAL HIGH (ref 0.40–1.20)
Glucose, Bld: 101 mg/dL — ABNORMAL HIGH (ref 70–99)

## 2009-11-22 ENCOUNTER — Encounter: Payer: Self-pay | Admitting: Family Medicine

## 2009-11-23 ENCOUNTER — Encounter: Payer: Self-pay | Admitting: Family Medicine

## 2009-11-27 ENCOUNTER — Ambulatory Visit: Payer: Self-pay | Admitting: Family Medicine

## 2009-11-28 ENCOUNTER — Ambulatory Visit (HOSPITAL_COMMUNITY): Admission: RE | Admit: 2009-11-28 | Discharge: 2009-11-28 | Payer: Self-pay | Admitting: Internal Medicine

## 2009-11-28 ENCOUNTER — Encounter: Payer: Self-pay | Admitting: Internal Medicine

## 2009-11-29 ENCOUNTER — Encounter: Payer: Self-pay | Admitting: Internal Medicine

## 2009-11-30 ENCOUNTER — Ambulatory Visit: Payer: Self-pay | Admitting: Cardiovascular Disease

## 2009-12-06 ENCOUNTER — Telehealth: Payer: Self-pay | Admitting: Family Medicine

## 2009-12-06 ENCOUNTER — Ambulatory Visit: Payer: Self-pay | Admitting: Family Medicine

## 2009-12-07 ENCOUNTER — Ambulatory Visit: Payer: Self-pay | Admitting: Internal Medicine

## 2009-12-12 ENCOUNTER — Ambulatory Visit: Payer: Self-pay | Admitting: Internal Medicine

## 2009-12-12 ENCOUNTER — Ambulatory Visit: Payer: Self-pay | Admitting: Gastroenterology

## 2009-12-12 ENCOUNTER — Inpatient Hospital Stay (HOSPITAL_COMMUNITY): Admission: EM | Admit: 2009-12-12 | Discharge: 2009-12-18 | Payer: Self-pay | Admitting: Emergency Medicine

## 2009-12-13 ENCOUNTER — Encounter (INDEPENDENT_AMBULATORY_CARE_PROVIDER_SITE_OTHER): Payer: Self-pay | Admitting: Internal Medicine

## 2009-12-15 ENCOUNTER — Encounter (INDEPENDENT_AMBULATORY_CARE_PROVIDER_SITE_OTHER): Payer: Self-pay | Admitting: Internal Medicine

## 2009-12-21 ENCOUNTER — Encounter: Payer: Self-pay | Admitting: Family Medicine

## 2009-12-27 ENCOUNTER — Ambulatory Visit: Payer: Self-pay | Admitting: Cardiology

## 2009-12-27 DIAGNOSIS — N184 Chronic kidney disease, stage 4 (severe): Secondary | ICD-10-CM | POA: Insufficient documentation

## 2009-12-28 ENCOUNTER — Encounter: Payer: Self-pay | Admitting: Family Medicine

## 2010-01-04 ENCOUNTER — Ambulatory Visit: Payer: Self-pay | Admitting: Family Medicine

## 2010-01-04 DIAGNOSIS — J4489 Other specified chronic obstructive pulmonary disease: Secondary | ICD-10-CM | POA: Insufficient documentation

## 2010-01-04 DIAGNOSIS — J449 Chronic obstructive pulmonary disease, unspecified: Secondary | ICD-10-CM

## 2010-01-05 ENCOUNTER — Encounter (INDEPENDENT_AMBULATORY_CARE_PROVIDER_SITE_OTHER): Payer: Self-pay | Admitting: *Deleted

## 2010-01-08 ENCOUNTER — Telehealth (INDEPENDENT_AMBULATORY_CARE_PROVIDER_SITE_OTHER): Payer: Self-pay | Admitting: *Deleted

## 2010-01-08 LAB — CONVERTED CEMR LAB
BUN: 21 mg/dL (ref 6–23)
Basophils Relative: 0.2 % (ref 0.0–3.0)
Calcium: 9.2 mg/dL (ref 8.4–10.5)
Creatinine, Ser: 1.6 mg/dL — ABNORMAL HIGH (ref 0.4–1.2)
Eosinophils Absolute: 0 10*3/uL (ref 0.0–0.7)
Eosinophils Relative: 0.4 % (ref 0.0–5.0)
Lymphocytes Relative: 14.3 % (ref 12.0–46.0)
Monocytes Absolute: 0.1 10*3/uL (ref 0.1–1.0)
Neutrophils Relative %: 84 % — ABNORMAL HIGH (ref 43.0–77.0)
Platelets: 205 10*3/uL (ref 150.0–400.0)
RBC: 4.07 M/uL (ref 3.87–5.11)
WBC: 7.9 10*3/uL (ref 4.5–10.5)

## 2010-01-09 ENCOUNTER — Ambulatory Visit: Payer: Self-pay | Admitting: Internal Medicine

## 2010-01-22 ENCOUNTER — Ambulatory Visit: Payer: Self-pay | Admitting: Family Medicine

## 2010-01-24 ENCOUNTER — Telehealth: Payer: Self-pay | Admitting: Family Medicine

## 2010-01-25 ENCOUNTER — Ambulatory Visit: Payer: Self-pay | Admitting: Family Medicine

## 2010-01-25 ENCOUNTER — Inpatient Hospital Stay (HOSPITAL_COMMUNITY): Admission: EM | Admit: 2010-01-25 | Discharge: 2010-01-30 | Payer: Self-pay | Admitting: Emergency Medicine

## 2010-01-25 ENCOUNTER — Ambulatory Visit: Payer: Self-pay | Admitting: Cardiology

## 2010-02-02 ENCOUNTER — Ambulatory Visit: Payer: Self-pay | Admitting: Family Medicine

## 2010-02-02 ENCOUNTER — Ambulatory Visit: Payer: Self-pay

## 2010-02-09 ENCOUNTER — Telehealth: Payer: Self-pay | Admitting: Cardiology

## 2010-02-12 ENCOUNTER — Encounter: Payer: Self-pay | Admitting: Family Medicine

## 2010-02-15 ENCOUNTER — Ambulatory Visit: Payer: Self-pay | Admitting: Family Medicine

## 2010-02-15 LAB — CONVERTED CEMR LAB
BUN: 15 mg/dL (ref 6–23)
Chloride: 101 meq/L (ref 96–112)
GFR calc non Af Amer: 40.46 mL/min (ref >60)
Glucose, Bld: 96 mg/dL (ref 70–99)
Potassium: 5.3 meq/L — ABNORMAL HIGH (ref 3.5–5.1)
Sodium: 145 meq/L (ref 135–145)

## 2010-02-16 ENCOUNTER — Ambulatory Visit: Payer: Self-pay | Admitting: Family Medicine

## 2010-02-16 LAB — CONVERTED CEMR LAB
CO2: 33 meq/L — ABNORMAL HIGH (ref 19–32)
Calcium: 8.9 mg/dL (ref 8.4–10.5)
Chloride: 102 meq/L (ref 96–112)
Glucose, Bld: 99 mg/dL (ref 70–99)
Sodium: 145 meq/L (ref 135–145)

## 2010-02-19 ENCOUNTER — Telehealth: Payer: Self-pay | Admitting: Cardiology

## 2010-02-27 ENCOUNTER — Encounter: Payer: Self-pay | Admitting: Cardiology

## 2010-02-27 ENCOUNTER — Ambulatory Visit: Payer: Self-pay | Admitting: Cardiology

## 2010-03-01 ENCOUNTER — Ambulatory Visit: Payer: Self-pay | Admitting: Family Medicine

## 2010-03-02 LAB — CONVERTED CEMR LAB
BUN: 12 mg/dL (ref 6–23)
Basophils Absolute: 0 10*3/uL (ref 0.0–0.1)
CO2: 33 meq/L — ABNORMAL HIGH (ref 19–32)
Chloride: 99 meq/L (ref 96–112)
Creatinine, Ser: 1.1 mg/dL (ref 0.4–1.2)
Eosinophils Absolute: 0.2 10*3/uL (ref 0.0–0.7)
Eosinophils Relative: 3.6 % (ref 0.0–5.0)
Glucose, Bld: 122 mg/dL — ABNORMAL HIGH (ref 70–99)
HCT: 32.9 % — ABNORMAL LOW (ref 36.0–46.0)
Lymphs Abs: 1.5 10*3/uL (ref 0.7–4.0)
MCHC: 34.3 g/dL (ref 30.0–36.0)
MCV: 85.7 fL (ref 78.0–100.0)
Monocytes Absolute: 0.4 10*3/uL (ref 0.1–1.0)
Neutrophils Relative %: 68 % (ref 43.0–77.0)
Platelets: 179 10*3/uL (ref 150.0–400.0)
Potassium: 3.7 meq/L (ref 3.5–5.1)
RDW: 15.4 % — ABNORMAL HIGH (ref 11.5–14.6)

## 2010-03-14 ENCOUNTER — Encounter: Payer: Self-pay | Admitting: Family Medicine

## 2010-03-22 ENCOUNTER — Telehealth: Payer: Self-pay | Admitting: Family Medicine

## 2010-03-22 ENCOUNTER — Encounter: Payer: Self-pay | Admitting: Family Medicine

## 2010-03-23 ENCOUNTER — Encounter (INDEPENDENT_AMBULATORY_CARE_PROVIDER_SITE_OTHER): Payer: Self-pay | Admitting: *Deleted

## 2010-04-09 ENCOUNTER — Other Ambulatory Visit: Payer: Self-pay | Admitting: Family Medicine

## 2010-04-09 ENCOUNTER — Ambulatory Visit
Admission: RE | Admit: 2010-04-09 | Discharge: 2010-04-09 | Payer: Self-pay | Source: Home / Self Care | Attending: Family Medicine | Admitting: Family Medicine

## 2010-04-09 LAB — CBC WITH DIFFERENTIAL/PLATELET
Basophils Absolute: 0 10*3/uL (ref 0.0–0.1)
Basophils Relative: 0.4 % (ref 0.0–3.0)
Eosinophils Absolute: 0.2 10*3/uL (ref 0.0–0.7)
Eosinophils Relative: 2.5 % (ref 0.0–5.0)
HCT: 34 % — ABNORMAL LOW (ref 36.0–46.0)
Hemoglobin: 11.6 g/dL — ABNORMAL LOW (ref 12.0–15.0)
Lymphocytes Relative: 23.5 % (ref 12.0–46.0)
Lymphs Abs: 1.8 10*3/uL (ref 0.7–4.0)
MCHC: 34.1 g/dL (ref 30.0–36.0)
MCV: 85.7 fl (ref 78.0–100.0)
Monocytes Absolute: 0.4 10*3/uL (ref 0.1–1.0)
Monocytes Relative: 4.9 % (ref 3.0–12.0)
Neutro Abs: 5.2 10*3/uL (ref 1.4–7.7)
Neutrophils Relative %: 68.7 % (ref 43.0–77.0)
Platelets: 185 10*3/uL (ref 150.0–400.0)
RBC: 3.97 Mil/uL (ref 3.87–5.11)
RDW: 14.2 % (ref 11.5–14.6)
WBC: 7.5 10*3/uL (ref 4.5–10.5)

## 2010-04-09 LAB — BASIC METABOLIC PANEL
BUN: 18 mg/dL (ref 6–23)
CO2: 32 mEq/L (ref 19–32)
Calcium: 9.2 mg/dL (ref 8.4–10.5)
Chloride: 98 mEq/L (ref 96–112)
Creatinine, Ser: 1.6 mg/dL — ABNORMAL HIGH (ref 0.4–1.2)
GFR: 34.23 mL/min — ABNORMAL LOW (ref >60.00)
Glucose, Bld: 94 mg/dL (ref 70–99)
Potassium: 5 mEq/L (ref 3.5–5.1)
Sodium: 141 mEq/L (ref 135–145)

## 2010-04-09 LAB — LIPID PANEL
Cholesterol: 145 mg/dL (ref 0–200)
HDL: 39.5 mg/dL (ref >39.00)
LDL Cholesterol: 81 mg/dL (ref 0–99)
Total CHOL/HDL Ratio: 4
Triglycerides: 125 mg/dL (ref 0.0–149.0)
VLDL: 25 mg/dL (ref 0.0–40.0)

## 2010-04-09 LAB — HEPATIC FUNCTION PANEL
ALT: 6 U/L (ref 0–35)
AST: 16 U/L (ref 0–37)
Albumin: 3.7 g/dL (ref 3.5–5.2)
Alkaline Phosphatase: 117 U/L (ref 39–117)
Bilirubin, Direct: 0.1 mg/dL (ref 0.0–0.3)
Total Bilirubin: 0.6 mg/dL (ref 0.3–1.2)
Total Protein: 6.6 g/dL (ref 6.0–8.3)

## 2010-04-09 LAB — HEMOGLOBIN A1C: Hgb A1c MFr Bld: 5.8 % (ref 4.6–6.5)

## 2010-04-23 ENCOUNTER — Encounter: Payer: Self-pay | Admitting: Family Medicine

## 2010-04-25 ENCOUNTER — Other Ambulatory Visit: Payer: Self-pay | Admitting: Radiology

## 2010-04-25 ENCOUNTER — Encounter: Payer: Self-pay | Admitting: Family Medicine

## 2010-04-26 ENCOUNTER — Encounter: Payer: Self-pay | Admitting: Family Medicine

## 2010-04-26 ENCOUNTER — Ambulatory Visit
Admission: RE | Admit: 2010-04-26 | Discharge: 2010-04-26 | Payer: Self-pay | Source: Home / Self Care | Attending: Cardiovascular Disease | Admitting: Cardiovascular Disease

## 2010-04-26 ENCOUNTER — Encounter: Payer: Self-pay | Admitting: Cardiovascular Disease

## 2010-04-29 LAB — CONVERTED CEMR LAB
AST: 24 units/L (ref 0–37)
Albumin: 3.5 g/dL (ref 3.5–5.2)
Alkaline Phosphatase: 83 units/L (ref 39–117)
BUN: 33 mg/dL — ABNORMAL HIGH (ref 6–23)
Basophils Absolute: 0 10*3/uL (ref 0.0–0.1)
Basophils Absolute: 0 10*3/uL (ref 0.0–0.1)
Bilirubin, Direct: 0.1 mg/dL (ref 0.0–0.3)
CO2: 32 meq/L (ref 19–32)
Calcium: 8.8 mg/dL (ref 8.4–10.5)
Chloride: 110 meq/L (ref 96–112)
Creatinine, Ser: 1.6 mg/dL — ABNORMAL HIGH (ref 0.4–1.2)
Eosinophils Absolute: 0 10*3/uL (ref 0.0–0.7)
Eosinophils Absolute: 0.3 10*3/uL (ref 0.0–0.7)
GFR calc non Af Amer: 45.5 mL/min (ref >60)
Glucose, Bld: 99 mg/dL (ref 70–99)
HCT: 27.3 % — ABNORMAL LOW (ref 36.0–46.0)
Hemoglobin: 8.8 g/dL — ABNORMAL LOW (ref 12.0–15.0)
Lymphs Abs: 1.3 10*3/uL (ref 0.7–4.0)
MCHC: 32.4 g/dL (ref 30.0–36.0)
MCHC: 34.6 g/dL (ref 30.0–36.0)
MCV: 77.6 fL — ABNORMAL LOW (ref 78.0–100.0)
MCV: 82.8 fL (ref 78.0–100.0)
Monocytes Absolute: 0.4 10*3/uL (ref 0.1–1.0)
Monocytes Absolute: 0.5 10*3/uL (ref 0.1–1.0)
Neutro Abs: 7.2 10*3/uL (ref 1.4–7.7)
Neutrophils Relative %: 58.3 % (ref 43.0–77.0)
Platelets: 142 10*3/uL — ABNORMAL LOW (ref 150.0–400.0)
Platelets: 182 10*3/uL (ref 150.0–400.0)
Potassium: 4.6 meq/L (ref 3.5–5.1)
RDW: 16.8 % — ABNORMAL HIGH (ref 11.5–14.6)
Sodium: 145 meq/L (ref 135–145)
WBC: 5.7 10*3/uL (ref 4.5–10.5)

## 2010-05-01 NOTE — Miscellaneous (Signed)
Summary: Orders Update  Clinical Lists Changes  Orders: Added new Test order of Renal Artery Duplex (Renal Artery Duplex) - Signed 

## 2010-05-01 NOTE — Miscellaneous (Signed)
Summary: PT Initial Note/[facility]  PT Initial Note/[facility]    Imported By: Lanelle Bal 05/17/2009 12:54:19  _____________________________________________________________________  External Attachment:    Type:   Image     Comment:   External Document

## 2010-05-01 NOTE — Progress Notes (Signed)
Summary: Off Plavix 5 day per Dr Charlies Constable  ---- Converted from flag ---- ---- 11/09/2009 8:32 AM, Hart Carwin MD wrote: Fortunato Curling DB  ---- 11/08/2009 10:46 PM, Lenoria Farrier, MD, [facility] wrote: Verlee Monte, She can come off Plavix for 5 days. Bruce ------------------------------

## 2010-05-01 NOTE — Assessment & Plan Note (Signed)
Summary: ROA FOR 30 MIN FOLLOW-UP/JRR   Vital Signs:  Patient profile:   77 year old female Height:      62 inches Weight:      194.2 pounds BMI:     35.65 O2 Sat:      90 % on 2 L/min Temp:     97.9 degrees F oral Pulse rate:   88 / minute Pulse rhythm:   regular BP sitting:   130 / 90  (left arm) Cuff size:   large  Vitals Entered By: Benny Lennert CMA (AAMA) (November 17, 2009 2:06 PM)  O2 Flow:  2 L/min  History of Present Illness: Chief complaint follow up   CHF...10 lb weight gain since last OV per our scales, but per pt on home scale weighed 182. Peripheral edema, no improvement on higher dose of lasix 160/120. Lasix has not made her urinate at all even at higher doses.  BP elevated some. No chest pain.  Stable SOB, on oxygen O2 sats stable on 2 L.  Redness in left foot spreading up ankle and more significant redness in right dorasl foot.  Increased wsarmth  Has colonoscopy scheduled at GI..in 2 weeks with Dr. Lina Sar.  Problems Prior to Update: 1)  Iron Deficiency  (ICD-280.9) 2)  Coumadin Therapy  (ICD-V58.61) 3)  Rectal Bleeding  (ICD-569.3) 4)  Edema  (ICD-782.3) 5)  Hypertensive Cardiovascular Disease, Benign  (ICD-402.10) 6)  Chronic Systolic Heart Failure  (ICD-428.22) 7)  Gi Bleeding  (ICD-578.9) 8)  Anemia, Normocytic  (ICD-285.9) 9)  Nausea and Vomiting  (ICD-787.01) 10)  Abdominal Mass  (ICD-789.30) 11)  Acute Bronchitis  (ICD-466.0) 12)  Hand Pain, Right  (ICD-729.5) 13)  Adhesive Capsulitis, Right  (ICD-726.0) 14)  Coronary Atherosclerosis Native Coronary Artery  (ICD-414.01) 15)  Shoulder Pain, Right  (ICD-719.41) 16)  Shoulder Strain, Right Trapezius  (ICD-840.9) 17)  Special Screening Malig Neoplasms Other Sites  (ICD-V76.49) 18)  Encounter For Long-term Use of Other Medications  (ICD-V58.69) 19)  Hypotension, Orthostatic  (ICD-458.0) 20)  Migraine Without Aura  (ICD-346.10) 21)  Uti  (ICD-599.0) 22)  Adult Failure To Thrive   (ICD-783.7) 23)  Syncope  (ICD-780.2) 24)  Dizziness  (ICD-780.4) 25)  Headache  (ICD-784.0) 26)  Need Prophylactic Vaccination&inoculation Flu  (ICD-V04.81) 27)  Gi Bleed  (ICD-578.9) 28)  Renal Artery Stenosis  (ICD-440.1) 29)  Atrial Fibrillation  (ICD-427.31) 30)  Cad, Autologous Bypass Graft  (ICD-414.02) 31)  Diastolic Heart Failure, Chronic  (ICD-428.32) 32)  Microscopic Hematuria  (ICD-599.72) 33)  Vaginal Bleeding  (ICD-623.8) 34)  Compression Fracture, L1 Vertebra  (ICD-805.4) 35)  Degenerative Disc Disease  (ICD-722.6) 36)  Polyneuropathy in Diabetes  (ICD-357.2) 37)  Depression  (ICD-311) 38)  Edema Leg  (ICD-782.3) 39)  Acute Posthemorrhagic Anemia  (ICD-285.1) 40)  Weakness  (ICD-780.79) 41)  Congestive Heart Failure, Chronic  (ICD-428.0) 42)  Congestive Heart Failure, Acute  (ICD-428.0) 43)  Gout, Acute  (ICD-274.9) 44)  Diverticulitis  (ICD-562.11) 45)  Herpes Zoster  (ICD-053.9) 46)  Back Pain, Right  (ICD-724.5) 47)  Encounter For Therapeutic Drug Monitoring  (ICD-V58.83) 48)  Hypercholesterolemia, Pure  (ICD-272.0) 49)  Anticoagulation Therapy  (ICD-V58.61) 50)  Cellulitis/abscess, Leg  (ICD-682.6) 51)  Coumadin Therapy  (ICD-V58.61) 52)  Carotid Bruits, Bilateral R Greater Than L  (ICD-785.9) 53)  Anemia, Iron Deficiency 10/07  (ICD-280.9) 54)  Carotid Artery Stenosis (MILD) Both 12/05  (ICD-433.10) 55)  Glaucoma (DR. GROAT)  (ICD-365.9) 56)  Pulmonary Embolism, Hx of  (  ICD-V12.51) 57)  Peripheral Vascular Disease  (ICD-443.9) 58)  Peptic Ulcer Disease  (ICD-533.90) 59)  Osteopenia  (ICD-733.90) 60)  Hypertension  (ICD-401.9) 61)  Hyperlipidemia  (ICD-272.4) 62)  Dvt, Hx of  (ICD-V12.51) 63)  Diverticulosis, Colon  (ICD-562.10) 64)  Diabetes Mellitus, Type II  (ICD-250.00) 65)  Coronary Artery Disease  (ICD-414.00)  Current Medications (verified): 1)  Furosemide 80 Mg Tabs (Furosemide) .... Take Two in The Morning and Take 100mg  in The Morning 2)   Cozaar 50 Mg Tabs (Losartan Potassium) .Marland Kitchen.. 1 Twice Daily 3)  Vytorin 10-20 Mg Tabs (Ezetimibe-Simvastatin) .... Take One Tab By Mouth Once Daily 4)  Plavix 75 Mg Tabs (Clopidogrel Bisulfate) .Marland Kitchen.. 1 Daily By Mouth 5)  Sertraline Hcl 100 Mg Tabs (Sertraline Hcl) .... Daily 6)  Norvasc 10 Mg Tabs (Amlodipine Besylate) .... Take One Tab By Mouth Once Daily 7)  Toprol Xl 25 Mg Xr24h-Tab (Metoprolol Succinate) .Marland Kitchen.. 1 By Mouth Daily 8)  Midodrine Hcl 2.5 Mg Tabs (Midodrine Hcl) .Marland Kitchen.. 1 By Mouth Three Times A Day 9)  Isosorbide Mononitrate Cr 30 Mg Xr24h-Tab (Isosorbide Mononitrate) .... Take 1 Tablet By Mouth Once A Day 10)  Xalatan 0.005 % Soln (Latanoprost) .... One Drop Each Eye Daily 11)  Benazepril Hcl 20 Mg Tabs (Benazepril Hcl) .... Take 1 Tablet By Mouth Once A Day 12)  Nitrostat 0.4 Mg Subl (Nitroglycerin) .Marland Kitchen.. 1 Tablet Under Tongue At Onset of Chest Pain; You May Repeat Every 5 Minutes For Up To 3 Doses. 13)  Ferrous Sulfate 325 (65 Fe) Mg Tbec (Ferrous Sulfate) .Marland Kitchen.. 1 Tab By Mouth Two Times A Day 14)  Omeprazole 40 Mg Cpdr (Omeprazole) .... Take One By Mouth Two Times A Day 15)  Miralax   Powd (Polyethylene Glycol 3350) .... As Per Prep  Instructions. 16)  Dulcolax 5 Mg  Tbec (Bisacodyl) .... Day Before Procedure Take 2 At 3pm and 2 At 8pm. 17)  Reglan 10 Mg  Tabs (Metoclopramide Hcl) .... As Per Prep Instructions. 18)  Klor-Con 20 Meq Pack (Potassium Chloride) .Marland Kitchen.. 1 Tab By Mouth Daily 19)  Zaroxolyn 5 Mg Tabs (Metolazone) .Marland Kitchen.. 1 Tab By Mouth Daily 20)  Doxycycline Hyclate 100 Mg Caps (Doxycycline Hyclate) .Marland Kitchen.. 1 Tab By Mouth Two Times A Day X 10 Days  Allergies: 1)  ! Pcn 2)  ! Codeine 3)  ! Demerol 4)  ! Morphine 5)  ! Darvon 6)  ! Sulfa 7)  ! * Niaspan  Past History:  Past medical, surgical, family and social histories (including risk factors) reviewed, and no changes noted (except as noted below).  Past Medical History: Reviewed history from 10/30/2008 and no changes  required. Diabetes mellitus, type II 8/05 Diverticulosis, colon inflamed hemorrhoids 03/13/06 Osteopenia 09/1999, Dexa 8/05 Osteopenia Peptic ulcer disease Peripheral vascular disease 1. Diastolic heart failure 2. Coronary artery disease, status post prior coronary bypass graft     surgery with  patent grafts  2010 3. Renovascular hypertension. 4. Renal artery stenosis, status post recent renal artery stenting on     the left. 5. History of paroxysmal atrial fibrillation, not a Coumadin     candidate. 6. History of deep vein thrombophlebitis and pulmonary embolism,     status post IV filter placement. 7. History of gastrointestinal bleeding. 8. Diabetes. 9. Hypertension. 10. S/P AAA repair 10.Hyperlipidemia.        . .  Past Surgical History: Reviewed history from 11/22/2008 and no changes required. Appendectomy Coronary artery bypass graft 09/04/01 Cataract extraction Both  eyes with IOL 2001 Cholecystectomy Hysterectomy (total) Carotid U/S unchanged 40-50% ICA stenosis--12/1998 Myoview [place]. Neg EF 76%--06/26/2004 Carotid doppler 12/05, unchanged 9/06 MRI Lumbar Spine Neg, Neurosurg 10/04 Cardiac cath 09/03/01 Stress cardiolite LVF 81%--8/01,  3/06 neg Renal U/S Unsatisfactory study 8/01 L Heart Cath 01/20/06 AAA Repair 1997 Myocardial perfusion EF 67% 10/07 EGD Abn. 12/07--esophageal stricture 1/09--06/30/07--gastritis and major bleed Colonoscopy Abn. 03/13/06 EMG--06/17/07--severd  DM neuropathy and radiculopathy---[facility]--06/27/2008--feet, abnormal MRI back--3/09--old compression fx L1, DDD--[facility] placement of inferior vena cava filter --07/03/07--[facility] blood transfusions x6 with GI bleed 3/27-07/03/07 Adenosin stress test 08/17/07---low risk stent to R kidney 8/09 renal artery doppler--11/17/2008--severe L common iliac stenosis  Family History: Reviewed history from 08/23/2008 and no changes required.  Mother deceased from cancer.  Father  deceased from a   CVA.  She had one brother who died from CVA and 3 brothers who died from   MI.   Social History: Reviewed history from 08/23/2008 and no changes required. She lives in [place].  She is married.  She quit   smoking in 1992, but she has a former history of 40-pack years.   Negative for EtOH.  Negative for drug use.  She uses a walker in the   nursing home for ambulation.      Review of Systems General:  Denies fatigue and fever. CV:  Denies chest pain or discomfort. Resp:  Denies sputum productive and wheezing. GI:  Denies abdominal pain. GU:  Denies dysuria.  Physical Exam  General:  overweight appearing female wearing oxygen inNAD Mouth:  Oral mucosa and oropharynx without lesions or exudates.  MMM Neck:  No deformities, masses, or tenderness noted. Lungs:  Normal respiratory effort, chest expands symmetrically. Lungs are clear to auscultation, no crackles or wheezes. Heart:  Normal rate and regular rhythm. S1 and S2 normal without gallop, 2/6 sys murmur,no click, rub or other extra sounds.  NO JVD Abdomen:  Bowel sounds positive,abdomen soft and non-tender without masses, organomegaly or hernias noted. Msk:  increased redness in left dorsal foot to foot crease..pain and ewarth on plpation Pulses:  R and L posterior tibial pulses are full and equal bilaterally  Extremities:  2+ left pedal edema and 2+ right pedal edema.     Impression & Recommendations:  Problem # 1:  EDEMA (ICD-782.3) No improvement on very high lasix dose. Will add zaroxolyn. if not improving with addition of this..may need to increase to 10 mg. No clear pulmonary edema on exam.  If not improving over weekend..consider IV lasix in hospital. Pt and husband do not feel comfortable doing home IV lasix through [facility]. Pt told if SOB increases...go to ER. Will recheck Cr and K on this medicaiton.  Will add potassium given high doses of diuretic Her updated medication list for this problem  includes:    Furosemide 80 Mg Tabs (Furosemide) .Marland Kitchen... Take two in the morning and take 100mg  in the morning    Zaroxolyn 5 Mg Tabs (Metolazone) .Marland Kitchen... 1 tab by mouth daily  Orders: TLB-BMP (Basic Metabolic Panel-BMET) (80048-METABOL)  Problem # 2:  CHRONIC SYSTOLIC HEART FAILURE (ICD-428.22) Poor control but no evidence of pulmonary edema.  Her updated medication list for this problem includes:    Furosemide 80 Mg Tabs (Furosemide) .Marland Kitchen... Take two in the morning and take 100mg  in the morning    Cozaar 50 Mg Tabs (Losartan potassium) .Marland Kitchen... 1 twice daily    Plavix 75 Mg Tabs (Clopidogrel bisulfate) .Marland Kitchen... 1 daily by mouth    Toprol  Xl 25 Mg Xr24h-tab (Metoprolol succinate) .Marland Kitchen... 1 by mouth daily    Benazepril Hcl 20 Mg Tabs (Benazepril hcl) .Marland Kitchen... Take 1 tablet by mouth once a day    Zaroxolyn 5 Mg Tabs (Metolazone) .Marland Kitchen... 1 tab by mouth daily  Problem # 3:  CELLULITIS/ABSCESS, LEG (ICD-682.6) Likely redenss and warmth due to peripheral edema but given she is a diabetic and redness appears to be worsening...will cover for infection. Treat with doxy to cover MRSA given recent hospitalization.  Her updated medication list for this problem includes:    Doxycycline Hyclate 100 Mg Caps (Doxycycline hyclate) .Marland Kitchen... 1 tab by mouth two times a day x 10 days  Complete Medication List: 1)  Furosemide 80 Mg Tabs (Furosemide) .... Take two in the morning and take 100mg  in the morning 2)  Cozaar 50 Mg Tabs (Losartan potassium) .Marland Kitchen.. 1 twice daily 3)  Vytorin 10-20 Mg Tabs (Ezetimibe-simvastatin) .... Take one tab by mouth once daily 4)  Plavix 75 Mg Tabs (Clopidogrel bisulfate) .Marland Kitchen.. 1 daily by mouth 5)  Sertraline Hcl 100 Mg Tabs (Sertraline hcl) .... Daily 6)  Norvasc 10 Mg Tabs (Amlodipine besylate) .... Take one tab by mouth once daily 7)  Toprol Xl 25 Mg Xr24h-tab (Metoprolol succinate) .Marland Kitchen.. 1 by mouth daily 8)  Midodrine Hcl 2.5 Mg Tabs (Midodrine hcl) .Marland Kitchen.. 1 by mouth three times a day 9)  Isosorbide  Mononitrate Cr 30 Mg Xr24h-tab (Isosorbide mononitrate) .... Take 1 tablet by mouth once a day 10)  Xalatan 0.005 % Soln (Latanoprost) .... One drop each eye daily 11)  Benazepril Hcl 20 Mg Tabs (Benazepril hcl) .... Take 1 tablet by mouth once a day 12)  Nitrostat 0.4 Mg Subl (Nitroglycerin) .Marland Kitchen.. 1 tablet under tongue at onset of chest pain; you may repeat every 5 minutes for up to 3 doses. 13)  Ferrous Sulfate 325 (65 Fe) Mg Tbec (Ferrous sulfate) .Marland Kitchen.. 1 tab by mouth two times a day 14)  Omeprazole 40 Mg Cpdr (Omeprazole) .... Take one by mouth two times a day 15)  Miralax Powd (Polyethylene glycol 3350) .... As per prep  instructions. 16)  Dulcolax 5 Mg Tbec (Bisacodyl) .... Day before procedure take 2 at 3pm and 2 at 8pm. 17)  Reglan 10 Mg Tabs (Metoclopramide hcl) .... As per prep instructions. 18)  Klor-con 20 Meq Pack (Potassium chloride) .Marland Kitchen.. 1 tab by mouth daily 19)  Zaroxolyn 5 Mg Tabs (Metolazone) .Marland Kitchen.. 1 tab by mouth daily 20)  Doxycycline Hyclate 100 Mg Caps (Doxycycline hyclate) .Marland Kitchen.. 1 tab by mouth two times a day x 10 days  Patient Instructions: 1)  Continue current dose of lasix. 2)   Add zaroxlyn 5 mg daily. 3)  Start potassium 1 tab daily...we will call if you need to increase this. 4)  Doxycycline 100 two times a day for possible cellulitis. 5)   We will call with lab results. 6)  Go to ER if fever, increase  in shortness of  weekend. 7)   Follow up on Monday with Dr. Patsy Lager.  Prescriptions: DOXYCYCLINE HYCLATE 100 MG CAPS (DOXYCYCLINE HYCLATE) 1 tab by mouth two times a day x 10 days  #20 x 0   Entered and Authorized by:   Kerby Nora MD   Signed by:   Kerby Nora MD on 11/17/2009   Method used:   Electronically to        [facility]  [address] [phone]* (retail)       [address]  [place], [place]  [address]       Ph: [phone] or [phone]       Fax: [phone]   RxID:   [id number] ZAROXOLYN 5 MG TABS (METOLAZONE) 1 tab by mouth daily  #30 x 3    Entered and Authorized by:   Kerby Nora MD   Signed by:   Kerby Nora MD on 11/17/2009   Method used:   [facility]        [facility]  [address] (retail)       [address]       [place], [place]  [address]       Ph: [phone] or [phone]       Fax: [phone]   RxID:   [id number] KLOR-CON 20 MEQ PACK (POTASSIUM CHLORIDE) 1 tab by mouth daily  #30 x 3   Entered and Authorized by:   Kerby Nora MD   Signed by:   Kerby Nora MD on 11/17/2009   Method used:   [facility]        [facility]  [address] (retail[facility]       [address]       [place], [place]  [address]       Ph: [phone] or [phone]       Fax: [phone]   RxID:   [id number]   Current Allergies (reviewed today): ! PCN ! CODEINE ! DEMEROL ! MORPHINE ! DARVON ! SULFA ! * NIASPAN  Appended Document: ROA FOR 30 MIN FOLLOW-UP/JRR 0

## 2010-05-01 NOTE — Assessment & Plan Note (Signed)
Summary: 6 WK F/U DLO   Vital Signs:  Patient profile:   77 year old female Height:      62 inches Weight:      201.4 pounds BMI:     36.97 Temp:     98.5 degrees F oral Pulse rate:   64 / minute Pulse rhythm:   regular BP sitting:   150 / 60  (left arm) Cuff size:   large  Vitals Entered By: Benny Lennert CMA [facility] Dull) (July 27, 2009 8:09 AM)  History of Present Illness: Chief complaint 6 week follow up  77 year old female:  R adhesive capsulitis, dramatically better. 100% improved. I did an intraarticular shoulder injection on last OV, she got better rapidly after this.  ROS: feels OK, ongoing cardiac med changes by cardiology No fever. no chills.  GEN: Well-developed,well-nourished,in no acute distress; alert,appropriate and cooperative throughout examination HEENT: Normocephalic and atraumatic without obvious abnormalities. No apparent alopecia or balding. Ears, externally no deformities PULM: Breathing comfortably in no respiratory distress PSYCH: Normally interactive. Cooperative during the interview. Pleasant. Friendly and conversant. Not anxious or depressed appearing. Normal, full affect.   Shoulder, R: full ROM, str 5/5. No limitations  Allergies: 1)  ! Pcn 2)  ! Codeine 3)  ! Demerol 4)  ! Morphine 5)  ! Darvon 6)  ! Sulfa 7)  ! * Niaspan   Impression & Recommendations:  Problem # 1:  ADHESIVE CAPSULITIS, RIGHT (ICD-726.0) Assessment Improved Full function  f/u as needed   Complete Medication List: 1)  Furosemide 80 Mg Tabs (Furosemide) .... Take two tabs every morning and one tab every evening. 2)  Neurontin 300 Mg Caps (Gabapentin) .Marland Kitchen.. 1 tablet by mouth three times a day 3)  Cozaar 50 Mg Tabs (Losartan potassium) .Marland Kitchen.. 1 twice daily 4)  Vytorin 10-40 Mg Tabs (Ezetimibe-simvastatin) .Marland Kitchen.. 1 daily by mouth 5)  Plavix 75 Mg Tabs (Clopidogrel bisulfate) .Marland Kitchen.. 1 daily by mouth 6)  Vitamin B-12 500 Mcg Tabs (Cyanocobalamin) .Marland Kitchen.. 1 daily by mouth 7)   Aspirin 81 Mg Tbec (Aspirin) .... Take one tablet by mouth daily 8)  Sertraline Hcl 100 Mg Tabs (Sertraline hcl) .... Daily 9)  Norvasc 10 Mg Tabs (Amlodipine besylate) .Marland Kitchen.. 1 daily 10)  Toprol Xl 25 Mg Xr24h-tab (Metoprolol succinate) .Marland Kitchen.. 1 by mouth daily 11)  Midodrine Hcl 2.5 Mg Tabs (Midodrine hcl) .Marland Kitchen.. 1 by mouth three times a day 12)  Isosorbide Mononitrate Cr 30 Mg Xr24h-tab (Isosorbide mononitrate) .... Take 1 tablet by mouth once a day 13)  Tylenol 325 Mg Tabs (Acetaminophen) .... Otc as directed. 14)  Xalatan 0.005 % Soln (Latanoprost) .... One drop each eye daily 15)  Iron 325 (65 Fe) Mg Tabs (Ferrous sulfate) .... Take 1 tablet by mouth once a day 16)  Folic Acid Powd (Folic acid) .... One daily 17)  Flexeril 5 Mg Tabs (Cyclobenzaprine hcl) .... One tab by mouth three times a day 18)  Benazepril Hcl 20 Mg Tabs (Benazepril hcl) .... Take 1 tablet by mouth once a day  Current Allergies (reviewed today): ! PCN ! CODEINE ! DEMEROL ! MORPHINE ! DARVON ! SULFA ! * NIASPAN

## 2010-05-01 NOTE — Miscellaneous (Signed)
Summary: Care Plan/[facility]  Care Plan/[facility]   Imported By: Sherian Rein 12/13/2009 08:54:06  _____________________________________________________________________  External Attachment:    Type:   Image     Comment:   External Document

## 2010-05-01 NOTE — Procedures (Signed)
Summary: Instructions for procedure/[place]  Instructions for procedure/[place]   Imported By: Sherian Rein 11/21/2009 13:37:58  _____________________________________________________________________  External Attachment:    Type:   Image     Comment:   External Document

## 2010-05-01 NOTE — Procedures (Signed)
Summary: Upper Endoscopy  Patient: [name] Note: All result statuses are Final unless otherwise noted.  Tests: (1) Upper Endoscopy (EGD)   EGD Upper Endoscopy       DONE     [facility]     [address]     [place], [place]  [address]           ENDOSCOPY PROCEDURE REPORT           PATIENT:  Donna Reese, Donna Reese  MR#:  [id number]     BIRTHDATE:  December 04, 1931, 78 yrs. old  GENDER:  female           ENDOSCOPIST:  Iva Boop, MD, [facility]           PROCEDURE DATE:  11/07/2009     PROCEDURE:  EGD, diagnostic     ASA CLASS:  Class III     INDICATIONS:  hemeoccult positive stool, anemia Hgb 11.5 12/201     now in 8 range     there is some ? of melena     she has had dark emesis intermittently x 3 weeks     she is on Plavix           MEDICATIONS:   Fentanyl 25 mcg IV, Versed 2 mg IV     TOPICAL ANESTHETIC:  Cetacaine Spray           DESCRIPTION OF PROCEDURE:   After the risks benefits and     alternatives of the procedure were thoroughly explained, informed     consent was obtained.  The EG-[id number] ([id number]) endoscope was     introduced through the mouth and advanced to the second portion of     the duodenum, without limitations.  The instrument was slowly     withdrawn as the mucosa was fully examined.     <<PROCEDUREIMAGES>>           Multiple erosions were found in the cardia. Several superficial     erosions with some ooze of heme.  A hiatal hernia was found. It     was 1 - 2 cm in size.  Otherwise the examination was normal. There     was a red spot in the duodenum that looked traumatic in orign. Do     not think it was an AVM.    Retroflexed views revealed a hiatal     hernia.    The scope was then withdrawn from the patient and the     procedure completed.           COMPLICATIONS:  None           ENDOSCOPIC IMPRESSION:     1) Erosions, multiple in the cardia - I think these are most     likely traumatic, from vomiting.     2) 1 - 2 cm hiatal  hernia     3) Otherwise normal examination     RECOMMENDATIONS:     A colonoscopy is reasonable given that last exam was in 2007. 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 If     colonoscopy is negative, I would treat with iron and see what     happens, unlesss she continues to vomit then would re-image small     bowel.     I think it makes most sense to obtain  clearance to hold Plavix     and perform an elective colonoscopuy as outpatient in next few     weeks.     We will arrange this (Dr. Juanda Chance is her primary     gastroenterologist).     She needs daily PPI AND NEEDS CHRONIC IRON - FERRITIN IS 9           REPEAT EXAM:  as needed           Iva Boop, MD, [facility]           CC:  Juleen China, MD     Lina Sar, MD           n.     eSIGNED:   Iva Boop at 11/07/2009 12:46 PM           Andrey Spearman, [id number]  Note: An exclamation mark (!) indicates a result that was not dispersed into the flowsheet. Document Creation Date: 11/07/2009 12:47 PM _______________________________________________________________________  (1) Order result status: Final Collection or observation date-time: 11/07/2009 12:10 Requested date-time:  Receipt date-time:  Reported date-time:  Referring Physician:   Ordering Physician: Stan Head [id number]) Specimen Source:  Source: Launa Grill Order Number: [id number] Lab site:

## 2010-05-01 NOTE — Assessment & Plan Note (Signed)
Summary: KNOTT IN STOMACH OR SIDE PAIN   Vital Signs:  Patient profile:   77 year old female Height:      62 inches Weight:      192.13 pounds BMI:     35.27 Temp:     98.8 degrees F oral Pulse rate:   64 / minute Pulse rhythm:   regular BP sitting:   150 / 40  (left arm) Cuff size:   large  Vitals Entered By: Linde Gillis CMA Duncan Dull) (September 20, 2009 10:43 AM) CC: knots in stomach   History of Present Illness: 77 year old female:  Pleasant patient with a history of hysterectomy, total, appendectomy, and cholecystectomy who presents with what she feels is a "growing knot" in her stomach.  Has a knot in her stomache. Has been there for abojut a year.   Sometimes is a little bit sore. Not really that painful. Had a AAA in repair - open.    somewhat uncomfortable and palpable in the hypograstric and RLQ areas. No bloody or black tarry stools.  o/w feeling fairly well.  Allergies: 1)  ! Pcn 2)  ! Codeine 3)  ! Demerol 4)  ! Morphine 5)  ! Darvon 6)  ! Sulfa 7)  ! * Niaspan  Past History:  Past medical, surgical, family and social histories (including risk factors) reviewed, and no changes noted (except as noted below).  Past Medical History: Reviewed history from 10/30/2008 and no changes required. Diabetes mellitus, type II 8/05 Diverticulosis, colon inflamed hemorrhoids 03/13/06 Osteopenia 09/1999, Dexa 8/05 Osteopenia Peptic ulcer disease Peripheral vascular disease 1. Diastolic heart failure 2. Coronary artery disease, status post prior coronary bypass graft     surgery with  patent grafts  2010 3. Renovascular hypertension. 4. Renal artery stenosis, status post recent renal artery stenting on     the left. 5. History of paroxysmal atrial fibrillation, not a Coumadin     candidate. 6. History of deep vein thrombophlebitis and pulmonary embolism,     status post IV filter placement. 7. History of gastrointestinal bleeding. 8. Diabetes. 9. Hypertension. 10.  S/P AAA repair 10.Hyperlipidemia.        . .  Past Surgical History: Reviewed history from 11/22/2008 and no changes required. Appendectomy Coronary artery bypass graft 09/04/01 Cataract extraction Both eyes with IOL 2001 Cholecystectomy Hysterectomy (total) Carotid U/S unchanged 40-50% ICA stenosis--12/1998 Myoview Adeno. Neg EF 76%--06/26/2004 Carotid doppler 12/05, unchanged 9/06 MRI Lumbar Spine Neg, Neurosurg 10/04 Cardiac cath 09/03/01 Stress cardiolite LVF 81%--8/01,  3/06 neg Renal U/S Unsatisfactory study 8/01 L Heart Cath 01/20/06 AAA Repair 1997 Myocardial perfusion EF 67% 10/07 EGD Abn. 12/07--esophageal stricture 1/09--06/30/07--gastritis and major bleed Colonoscopy Abn. 03/13/06 EMG--06/17/07--severd  DM neuropathy and radiculopathy---[facility]--06/27/2008--feet, abnormal MRI back--3/09--old compression fx L1, DDD--[facility] placement of inferior vena cava filter --07/03/07--[facility] blood transfusions x6 with GI bleed 3/27-07/03/07 Adenosin stress test 08/17/07---low risk stent to R kidney 8/09 renal artery doppler--11/17/2008--severe L common iliac stenosis  Family History: Reviewed history from 08/23/2008 and no changes required.  Mother deceased from cancer.  Father deceased from a   CVA.  She had one brother who died from CVA and 3 brothers who died from   MI.   Social History: Reviewed history from 08/23/2008 and no changes required. She lives in [place].  She is married.  She quit   smoking in 1992, but she has a former history of 40-pack years.   Negative for EtOH.  Negative for drug use.  She uses a walker in the   nursing home for ambulation.      Review of Systems      See HPI General:  Denies chills and fever. GI:  See HPI; Complains of abdominal pain; denies nausea and vomiting.  Physical Exam  General:  Well-developed,well-nourished,in no acute distress; alert,appropriate and cooperative throughout examination, non toxic  appearing but coughing Head:  normocephalic, atraumatic, and no abnormalities observed.  Ears:  no external deformities.   Nose:  no external deformity.   Lungs:  Normal respiratory effort, chest expands symmetrically. Lungs are clear to auscultation, no crackles or wheezes. Heart:  Normal rate and regular rhythm. S1 and S2 normal without gallop, murmur, click, rub or other extra sounds. Abdomen:  S, NT, ND, +BS There is a fullness in the RLQ / hypogastric area and also adjacent fatty tissue. Large ventral scar present. Cervical Nodes:  No lymphadenopathy noted Psych:  Cognition and judgment appear intact. Alert and cooperative with normal attention span and concentration. No apparent delusions, illusions, hallucinations   Impression & Recommendations:  Problem # 1:  ABDOMINAL MASS (ICD-789.30) Assessment New 77 yo with multiple medical problems  New onset growing mass and pain in lower abdomen over the last year. Likely extensive scar tissue from prior surgery, obese -- not clear if this is ventral hernia vs. mass. Certainly could be mass / tumor in elderly patient and needs evaluation more -- will obtain a CT of abdomen and pelvis with IV contrast.  Orders: TLB-BMP (Basic Metabolic Panel-BMET) (80048-METABOL) Venipuncture (40981) Radiology Referral (Radiology)  Complete Medication List: 1)  Furosemide 80 Mg Tabs (Furosemide) .... Take two tabs every morning and one tab every evening. 2)  Cozaar 50 Mg Tabs (Losartan potassium) .Marland Kitchen.. 1 twice daily 3)  Vytorin 10-40 Mg Tabs (Ezetimibe-simvastatin) .Marland Kitchen.. 1 daily by mouth 4)  Plavix 75 Mg Tabs (Clopidogrel bisulfate) .Marland Kitchen.. 1 daily by mouth 5)  Aspirin 81 Mg Tbec (Aspirin) .... Take one tablet by mouth daily 6)  Sertraline Hcl 100 Mg Tabs (Sertraline hcl) .... Daily 7)  Norvasc 10 Mg Tabs (Amlodipine besylate) .Marland Kitchen.. 1 daily 8)  Toprol Xl 25 Mg Xr24h-tab (Metoprolol succinate) .Marland Kitchen.. 1 by mouth daily 9)  Midodrine Hcl 2.5 Mg Tabs (Midodrine  hcl) .Marland Kitchen.. 1 by mouth three times a day 10)  Isosorbide Mononitrate Cr 30 Mg Xr24h-tab (Isosorbide mononitrate) .... Take 1 tablet by mouth once a day 11)  Tylenol 325 Mg Tabs (Acetaminophen) .... Otc as directed. 12)  Xalatan 0.005 % Soln (Latanoprost) .... One drop each eye daily 13)  Benazepril Hcl 20 Mg Tabs (Benazepril hcl) .... Take 1 tablet by mouth once a day  Patient Instructions: 1)  Referral Appointment Information 2)  Day/Date: 3)  Time: 4)  Place/MD: 5)  Address: 6)  Phone/Fax: 7)  Patient given appointment information. Information/Orders faxed/mailed.   Current Allergies (reviewed today): ! PCN ! CODEINE ! DEMEROL ! MORPHINE ! DARVON ! SULFA ! * NIASPAN

## 2010-05-01 NOTE — Progress Notes (Signed)
Summary: refill request  Phone Note Refill Request Message from:  Patient on February 19, 2010 10:03 AM  Refills Requested: Medication #1:  ISOSORBIDE MONONITRATE CR 30 MG XR24H-TAB Take 1 tablet by mouth once a day  Medication #2:  FUROSEMIDE 80 MG TABS 1 tab two times a day 90 day supply with 3 refills   Method Requested: Telephone to Pharmacy Initial call taken by: Glynda Jaeger,  February 19, 2010 10:03 AM    Prescriptions: ISOSORBIDE MONONITRATE CR 30 MG XR24H-TAB (ISOSORBIDE MONONITRATE) Take 1 tablet by mouth once a day  #90 x 3   Entered by:   Kem Parkinson   Authorized by:   Lenoria Farrier, MD, [facility]   Signed by:   Kem Parkinson on 02/19/2010   Method used:   Electronically to        [facility]* (retail)             ,          Ph: [phone]       Fax: [phone]   RxID:   [id number] FUROSEMIDE 80 MG TABS (FUROSEMIDE) 1 tab two times a day  #180 x 3   Entered by:   Kem Parkinson   Authorized by:   Lenoria Farrier, MD, [facility]   Signed by:   Kem Parkinson on 02/19/2010   Method used:   Electronically to        [facility]* (retail)             ,          Ph: [phone]       Fax: [phone]   RxID:   [id number]

## 2010-05-01 NOTE — Progress Notes (Signed)
Summary: need to verify medication  Phone Note From Pharmacy Call back at [phone]   Caller: [facility] ref# [id number] Summary of Call: pharmacy need to verify medication  Initial call taken by: Judie Grieve,  September 19, 2009 4:00 PM  Follow-up for Phone Call        [facility] called to clarify a refill for this pt's Benazepril 20mg .  This medication was increased at 07/05/09 OV with Dr Juanda Chance.  [facility] given the okay to fill this Rx.  Follow-up by: Julieta Gutting, RN, BSN,  September 19, 2009 5:23 PM

## 2010-05-01 NOTE — Letter (Signed)
Summary: [facility] Instructions  [facility]  [address] [place], [place] [address]   Phone: [phone]  Fax: [phone]       Donna Reese    [birth year]    MRN: [id number]       Procedure Day /Date:11-28-09     Arrival Time: 9:00 AM      Procedure Time:10:00 AM     Location of Procedure:                     X      [facility] ( Outpatient Registration)     PREPARATION FOR COLONOSCOPY WITH MIRALAX  Starting 5 days prior to your procedure 8-25-11do not eat nuts, seeds, popcorn, corn, beans, peas,  salads, or any raw vegetables.  Do not take any fiber supplements (e.g. Metamucil, Citrucel, and Benefiber). ____________________________________________________________________________________________________   THE DAY BEFORE YOUR PROCEDURE         DATE: 11-28-09 DAY: Tuesday  1   Drink clear liquids the entire day-NO SOLID FOOD  2   Do not drink anything colored red or purple.  Avoid juices with pulp.  No orange juice.  3   Drink at least 64 oz. (8 glasses) of fluid/clear liquids during the day to prevent dehydration and help the prep work efficiently.  CLEAR LIQUIDS INCLUDE: Water Jello Ice Popsicles Tea (sugar ok, no milk/cream) Powdered fruit flavored drinks Coffee (sugar ok, no milk/cream) Gatorade Juice: apple, white grape, white cranberry  Lemonade Clear bullion, consomm, broth Carbonated beverages (any kind) Strained chicken noodle soup Hard Candy  4   Mix the entire bottle of Miralax with 64 oz. of Gatorade/Powerade in the morning and put in the refrigerator to chill.  5   At 3:00 pm take 2 Dulcolax/Bisacodyl tablets.  6   At 4:30 pm take one Reglan/Metoclopramide tablet.  7  Starting at 5:00 pm drink one 8 oz glass of the Miralax mixture every 15-20 minutes until you have finished drinking the entire 64 oz.  You should finish drinking prep around 7:30 or 8:00 pm.  8   If you are nauseated, you may take the 2nd Reglan/Metoclopramide  tablet at 6:30 pm.        9    At 8:00 pm take 2 more DULCOLAX/Bisacodyl tablets.     THE DAY OF YOUR PROCEDURE      DATE: 11-28-09 DAY: Tuesday  You may drink clear liquids until 6:00 AM  (4 HOURS BEFORE PROCEDURE).   MEDICATION INSTRUCTIONS  Unless otherwise instructed, you should take regular prescription medications with a small sip of water as early as possible the morning of your procedure.  Stop taking the Plavix starting on 11-23-09 and you can resume it on 11-29-09 Wednesday.       OTHER INSTRUCTIONS  You will need a responsible adult at least 75 years of age to accompany you and drive you home.   This person must remain in the waiting room during your procedure.  Wear loose fitting clothing that is easily removed.  Leave jewelry and other valuables at home.  However, you may wish to bring a book to read or an iPod/MP3 player to listen to music as you wait for your procedure to start.  Remove all body piercing jewelry and leave at home.  Total time from sign-in until discharge is approximately 2-3 hours.  You should go home directly after your procedure and rest.  You can resume normal activities the day after your  procedure.  The day of your procedure you should not:   Drive   Make legal decisions   Operate machinery   Drink alcohol   Return to work  You will receive specific instructions about eating, activities and medications before you leave.   The above instructions have been reviewed and explained to me by   _______________________    I fully understand and can verbalize these instructions _____________________________ Date _______

## 2010-05-01 NOTE — Assessment & Plan Note (Signed)
Summary: ROA FOR FOLLOW-UP/JRR   Vital Signs:  Patient profile:   77 year old female Height:      62 inches Weight:      185.6 pounds BMI:     34.07 O2 Sat:      91 % on 2 L/min Temp:     97.6 degrees F oral Pulse rate:   64 / minute Pulse rhythm:   regular BP sitting:   140 / 60  (left arm) Cuff size:   large  Vitals Entered By: Benny Lennert CMA Duncan Dull) (November 20, 2009 11:59 AM)  O2 Flow:  2 L/min  History of Present Illness: Chief complaint Monday follow up  77 year old female:  very well-known to me, recent hospitalization with a slow decrease in hemoglobin, slow GI bleed. She has an upcoming colonoscopy scheduled in one week with Dr. Juanda Chance.  Hemoglobin has been stable, and the patient feels much better.  CHF, increased edema, she had a relatively pronounced increase in edema with a 10 pound fluid gain compared to prior office visit seen him last Friday. At that time, Lasix dosing was increased and the patient is also on some Zaroxolyn, which was a new medication. She is diuresed 9 pounds since that time, she has no shortness of breath and her edema is much better.  Cellulitis: This is a presumed cellulitis in the left foot on the dorsum, it is mildly warm to touch and red, though it does appear to be somewhat better compared to prior evaluation. Patient is currently on doxycycline, and has multiple antibiotic allergies.    Allergies: 1)  ! Pcn 2)  ! Codeine 3)  ! Demerol 4)  ! Morphine 5)  ! Darvon 6)  ! Sulfa 7)  ! * Niaspan  Past History:  Past medical, surgical, family and social histories (including risk factors) reviewed, and no changes noted (except as noted below).  Past Medical History: Reviewed history from 10/30/2008 and no changes required. Diabetes mellitus, type II 8/05 Diverticulosis, colon inflamed hemorrhoids 03/13/06 Osteopenia 09/1999, Dexa 8/05 Osteopenia Peptic ulcer disease Peripheral vascular disease 1. Diastolic heart failure 2.  Coronary artery disease, status post prior coronary bypass graft     surgery with  patent grafts  2010 3. Renovascular hypertension. 4. Renal artery stenosis, status post recent renal artery stenting on     the left. 5. History of paroxysmal atrial fibrillation, not a Coumadin     candidate. 6. History of deep vein thrombophlebitis and pulmonary embolism,     status post IV filter placement. 7. History of gastrointestinal bleeding. 8. Diabetes. 9. Hypertension. 10. S/P AAA repair 10.Hyperlipidemia.        . .  Past Surgical History: Reviewed history from 11/22/2008 and no changes required. Appendectomy Coronary artery bypass graft 09/04/01 Cataract extraction Both eyes with IOL 2001 Cholecystectomy Hysterectomy (total) Carotid U/S unchanged 40-50% ICA stenosis--12/1998 Myoview Adeno. Neg EF 76%--06/26/2004 Carotid doppler 12/05, unchanged 9/06 MRI Lumbar Spine Neg, Neurosurg 10/04 Cardiac cath 09/03/01 Stress cardiolite LVF 81%--8/01,  3/06 neg Renal U/S Unsatisfactory study 8/01 L Heart Cath 01/20/06 AAA Repair 1997 Myocardial perfusion EF 67% 10/07 EGD Abn. 12/07--esophageal stricture 1/09--06/30/07--gastritis and major bleed Colonoscopy Abn. 03/13/06 EMG--06/17/07--severd  DM neuropathy and radiculopathy---[facility]--06/27/2008--feet, abnormal MRI back--3/09--old compression fx L1, DDD--[facility] placement of inferior vena cava filter --07/03/07--[facility] blood transfusions x6 with GI bleed 3/27-07/03/07 Adenosin stress test 08/17/07---low risk stent to R kidney 8/09 renal artery doppler--11/17/2008--severe L common iliac stenosis  Family History: Reviewed history  from 08/23/2008 and no changes required.  Mother deceased from cancer.  Father deceased from a   CVA.  She had one brother who died from CVA and 3 brothers who died from   MI.   Social History: Reviewed history from 08/23/2008 and no changes required. She lives in [place].  She is married.  She  quit   smoking in 1992, but she has a former history of 40-pack years.   Negative for EtOH.  Negative for drug use.  She uses a walker in the   nursing home for ambulation.      Review of Systems      See HPI General:  Complains of fatigue; denies chills and fever. CV:  Complains of swelling of feet; denies shortness of breath with exertion and weight gain. Resp:  Denies shortness of breath, sputum productive, and wheezing.  Physical Exam  General:  Well-developed,well-nourished,in no acute distress; alert,appropriate and cooperative throughout examination Head:  Normocephalic and atraumatic without obvious abnormalities. No apparent alopecia or balding. Ears:  no external deformities.   Nose:  no external deformity.   Lungs:  Normal respiratory effort, chest expands symmetrically. Lungs are clear to auscultation, no crackles or wheezes. Heart:  Normal rate and regular rhythm. S1 and S2 normal without gallop, 2/6 sys murmur,no click, rub or other extra sounds.  NO JVD Abdomen:  Bowel sounds positive,abdomen soft and non-tender without masses, organomegaly or hernias noted. Extremities:  trace edema Skin:  on the left foot, there is some redness and mildly tender on the dorsum Psych:  Cognition and judgment appear intact. Alert and cooperative with normal attention span and concentration. No apparent delusions, illusions, hallucinations   Impression & Recommendations:  Problem # 1:  CHRONIC SYSTOLIC HEART FAILURE (ICD-428.22) patient has diuresed 9 pounds. She symptomatically is improved, no shortness of breath, and edema is dramatically better.  She has followup in 8 days with cardiology, and I appreciate their input.  Assistance with worsening CHF is my clinical question. Coreg vs. Toprol? Any suggestions appreciated.  Her updated medication list for this problem includes:    Furosemide 80 Mg Tabs (Furosemide) .Marland Kitchen... Take two in the morning and take 100mg  in the morning     Cozaar 50 Mg Tabs (Losartan potassium) .Marland Kitchen... 1 twice daily    Plavix 75 Mg Tabs (Clopidogrel bisulfate) .Marland Kitchen... 1 daily by mouth    Toprol Xl 25 Mg Xr24h-tab (Metoprolol succinate) .Marland Kitchen... 1 by mouth daily    Benazepril Hcl 20 Mg Tabs (Benazepril hcl) .Marland Kitchen... Take 1 tablet by mouth once a day    Zaroxolyn 5 Mg Tabs (Metolazone) .Marland Kitchen... 1 tab by mouth daily  Problem # 2:  EDEMA (ICD-782.3)  Her updated medication list for this problem includes:    Furosemide 80 Mg Tabs (Furosemide) .Marland Kitchen... Take two in the morning and take 100mg  in the morning    Zaroxolyn 5 Mg Tabs (Metolazone) .Marland Kitchen... 1 tab by mouth daily  Problem # 3:  RECTAL BLEEDING (ICD-569.3) colonoscopy pending  Problem # 4:  IRON DEFICIENCY (ICD-280.9)  Her updated medication list for this problem includes:    Ferrous Sulfate 325 (65 Fe) Mg Tbec (Ferrous sulfate) .Marland Kitchen... 1 tab by mouth two times a day  Problem # 5:  CELLULITIS, LEGS (ICD-682.6) born to broaden the coverage, and added Avelox x10 days. This is certainly suspicious for cellulitis, and I feel like we are obligated to treat in his case.  Her updated medication list for this problem includes:  Doxycycline Hyclate 100 Mg Caps (Doxycycline hyclate) .Marland Kitchen... 1 tab by mouth two times a day x 10 days    Avelox 400 Mg Tabs (Moxifloxacin hcl) .Marland Kitchen... 1 tablet by mouth daily  Complete Medication List: 1)  Furosemide 80 Mg Tabs (Furosemide) .... Take two in the morning and take 100mg  in the morning 2)  Cozaar 50 Mg Tabs (Losartan potassium) .Marland Kitchen.. 1 twice daily 3)  Vytorin 10-20 Mg Tabs (Ezetimibe-simvastatin) .... Take one tab by mouth once daily 4)  Plavix 75 Mg Tabs (Clopidogrel bisulfate) .Marland Kitchen.. 1 daily by mouth 5)  Sertraline Hcl 100 Mg Tabs (Sertraline hcl) .... Daily 6)  Norvasc 10 Mg Tabs (Amlodipine besylate) .... Take one tab by mouth once daily 7)  Toprol Xl 25 Mg Xr24h-tab (Metoprolol succinate) .Marland Kitchen.. 1 by mouth daily 8)  Midodrine Hcl 2.5 Mg Tabs (Midodrine hcl) .Marland Kitchen.. 1 by mouth  three times a day 9)  Isosorbide Mononitrate Cr 30 Mg Xr24h-tab (Isosorbide mononitrate) .... Take 1 tablet by mouth once a day 10)  Xalatan 0.005 % Soln (Latanoprost) .... One drop each eye daily 11)  Benazepril Hcl 20 Mg Tabs (Benazepril hcl) .... Take 1 tablet by mouth once a day 12)  Nitrostat 0.4 Mg Subl (Nitroglycerin) .Marland Kitchen.. 1 tablet under tongue at onset of chest pain; you may repeat every 5 minutes for up to 3 doses. 13)  Ferrous Sulfate 325 (65 Fe) Mg Tbec (Ferrous sulfate) .Marland Kitchen.. 1 tab by mouth two times a day 14)  Omeprazole 40 Mg Cpdr (Omeprazole) .... Take one by mouth two times a day 15)  Miralax Powd (Polyethylene glycol 3350) .... As per prep  instructions. 16)  Dulcolax 5 Mg Tbec (Bisacodyl) .... Day before procedure take 2 at 3pm and 2 at 8pm. 17)  Reglan 10 Mg Tabs (Metoclopramide hcl) .... As per prep instructions. 18)  Klor-con 20 Meq Pack (Potassium chloride) .Marland Kitchen.. 1 tab by mouth daily 19)  Zaroxolyn 5 Mg Tabs (Metolazone) .Marland Kitchen.. 1 tab by mouth daily 20)  Doxycycline Hyclate 100 Mg Caps (Doxycycline hyclate) .Marland Kitchen.. 1 tab by mouth two times a day x 10 days 21)  Avelox 400 Mg Tabs (Moxifloxacin hcl) .Marland Kitchen.. 1 tablet by mouth daily  Patient Instructions: 1)  f/u with me 1 week 2)  f/u Dr. Charlies Constable or Cardiology in the next 2 weeks Prescriptions: AVELOX 400 MG  TABS (MOXIFLOXACIN HCL) 1 tablet by mouth daily  #10 x 0   Entered and Authorized by:   Hannah Beat MD   Signed by:   Hannah Beat MD on 11/20/2009   Method used:   Historical   RxID:   [id number]   Current Allergies (reviewed today): ! PCN ! CODEINE ! DEMEROL ! MORPHINE ! DARVON ! SULFA ! * NIASPAN

## 2010-05-01 NOTE — Progress Notes (Signed)
Summary: regarding blood draw and commode  Phone Note From Other Clinic   Caller: Misty Stanley with [facility]   [phone] Summary of Call: Dr. Juanda Chance had ordered nurse from [facility] to draw a BMET on friday.  Pt has appt here on friday and nurse is asking if we can draw that here.  Also another call from [facility]- they are asking for an order for a bedside commode.  I could not get rep's name but her number is [phone]. Initial call taken by: Lowella Petties CMA,  November 15, 2009 2:24 PM  Follow-up for Phone Call        Yes we can draw BMET here. Rx in outbox for bedside commode.  Follow-up by: Kerby Nora MD,  November 15, 2009 5:01 PM  Additional Follow-up for Phone Call Additional follow up Details #1::        nurses advised.[name] CMA   Additional Follow-up by: Benny Lennert CMA Duncan Dull),  November 16, 2009 8:36 AM

## 2010-05-01 NOTE — Miscellaneous (Signed)
Summary: Physical Therapy Initial Note  Physical Therapy Initial Note   Imported By: Maryln Gottron 07/10/2009 11:29:57  _____________________________________________________________________  External Attachment:    Type:   Image     Comment:   External Document

## 2010-05-01 NOTE — Assessment & Plan Note (Signed)
Summary: f82m   Visit Type:  Follow-up Primary Provider:  Hannah Beat MD  CC:  shortness of breath, very weak, and not keeping food down.  History of Present Illness: The patient is a 77 years old and return for management of CHF. She had remote bypass surgery and had patent grafts in 2010. She has chronic diastolic CHF.  felt to be a Coumaatrial fibrillation and is not felt to be a Coumadin candidate. She also has a history of DVT and pulmonary embolism and has an IVC filter in place.  She says she's not been doing well. She's had increased swelling and some increased shortness of breath. She's had minimal chest pain.  She also says that after she eat she vomits. She's had some symptoms of heartburn.  Her other problems include hypertension, hyperlipidemia, and diabetes. She is peripheral vascular disease status post previous abdominal bifemoral bypass surgery and is followed by Dr. Excell Seltzer. She is scheduled for an aortic ultrasound tomorrow.  Current Medications (verified): 1)  Furosemide 80 Mg Tabs (Furosemide) .... Take One Tab Every Morning and One Tab Every Evening. 2)  Cozaar 50 Mg Tabs (Losartan Potassium) .Marland Kitchen.. 1 Twice Daily 3)  Vytorin 10-40 Mg Tabs (Ezetimibe-Simvastatin) .Marland Kitchen.. 1 Daily By Mouth 4)  Plavix 75 Mg Tabs (Clopidogrel Bisulfate) .Marland Kitchen.. 1 Daily By Mouth 5)  Aspirin 81 Mg Tbec (Aspirin) .... Take One Tablet By Mouth Daily 6)  Sertraline Hcl 100 Mg Tabs (Sertraline Hcl) .... Daily 7)  Norvasc 10 Mg Tabs (Amlodipine Besylate) .Marland Kitchen.. 1 Daily 8)  Toprol Xl 25 Mg Xr24h-Tab (Metoprolol Succinate) .Marland Kitchen.. 1 By Mouth Daily 9)  Midodrine Hcl 2.5 Mg Tabs (Midodrine Hcl) .Marland Kitchen.. 1 By Mouth Three Times A Day 10)  Isosorbide Mononitrate Cr 30 Mg Xr24h-Tab (Isosorbide Mononitrate) .... Take 1 Tablet By Mouth Once A Day 11)  Xalatan 0.005 % Soln (Latanoprost) .... One Drop Each Eye Daily 12)  Benazepril Hcl 20 Mg Tabs (Benazepril Hcl) .... Take 1 Tablet By Mouth Once A Day 13)  Nitrostat 0.4  Mg Subl (Nitroglycerin) .Marland Kitchen.. 1 Tablet Under Tongue At Onset of Chest Pain; You May Repeat Every 5 Minutes For Up To 3 Doses.  Allergies (verified): 1)  ! Pcn 2)  ! Codeine 3)  ! Demerol 4)  ! Morphine 5)  ! Darvon 6)  ! Sulfa 7)  ! * Niaspan  Past History:  Past Medical History: Reviewed history from 10/30/2008 and no changes required. Diabetes mellitus, type II 8/05 Diverticulosis, colon inflamed hemorrhoids 03/13/06 Osteopenia 09/1999, Dexa 8/05 Osteopenia Peptic ulcer disease Peripheral vascular disease 1. Diastolic heart failure 2. Coronary artery disease, status post prior coronary bypass graft     surgery with  patent grafts  2010 3. Renovascular hypertension. 4. Renal artery stenosis, status post recent renal artery stenting on     the left. 5. History of paroxysmal atrial fibrillation, not a Coumadin     candidate. 6. History of deep vein thrombophlebitis and pulmonary embolism,     status post IV filter placement. 7. History of gastrointestinal bleeding. 8. Diabetes. 9. Hypertension. 10. S/P AAA repair 10.Hyperlipidemia.        . .  Review of Systems       ROS is negative except as outlined in HPI.   Vital Signs:  Patient profile:   77 year old female Height:      62 inches Weight:      194 pounds BMI:     35.61 Pulse rate:   71 /  minute BP sitting:   158 / 50  (left arm) Cuff size:   large  Vitals Entered By: Burnett Kanaris, CNA (October 31, 2009 8:30 AM)  Physical Exam  Additional Exam:  Gen. Well-nourished, in no distress   Neck: JVD 1-2 cm above the clavicle, thyroid not enlarged, no carotid bruits Lungs: No tachypnea, 2 basilar rales, rhonchi or wheezes Cardiovascular: Rhythm regular, PMI not displaced,  heart sounds  normal, no murmurs or gallops, 1+ peripheral edema, pulses normal in all 4 extremities. Abdomen: BS normal, abdomen soft and non-tender without masses or organomegaly, no hepatosplenomegaly. MS: No deformities, no cyanosis or  clubbing   Neuro:  No focal sns   Skin:  no lesions    Impression & Recommendations:  Problem # 1:  DIASTOLIC HEART FAILURE, CHRONIC (ICD-428.32) She has diastolic heart failure and has some evidence of volume overload today. We will increase her Lasix to 120 mg in the morning and 80 mg in the afternoon. We'll get a BMP today and in a week. Her updated medication list for this problem includes:    Furosemide 80 Mg Tabs (Furosemide) .Marland Kitchen... Take one and 1/2 tablets in the morning and one tablet in the evening    Cozaar 50 Mg Tabs (Losartan potassium) .Marland Kitchen... 1 twice daily    Plavix 75 Mg Tabs (Clopidogrel bisulfate) .Marland Kitchen... 1 daily by mouth    Aspirin 81 Mg Tbec (Aspirin) .Marland Kitchen... Take one tablet by mouth daily    Norvasc 10 Mg Tabs (Amlodipine besylate) .Marland Kitchen... Take one tab by mouth once daily    Toprol Xl 25 Mg Xr24h-tab (Metoprolol succinate) .Marland Kitchen... 1 by mouth daily    Isosorbide Mononitrate Cr 30 Mg Xr24h-tab (Isosorbide mononitrate) .Marland Kitchen... Take 1 tablet by mouth once a day    Benazepril Hcl 20 Mg Tabs (Benazepril hcl) .Marland Kitchen... Take 1 tablet by mouth once a day    Nitrostat 0.4 Mg Subl (Nitroglycerin) .Marland Kitchen... 1 tablet under tongue at onset of chest pain; you may repeat every 5 minutes for up to 3 doses.  Problem # 2:  CORONARY ATHEROSCLEROSIS NATIVE CORONARY ARTERY (ICD-414.01)  Her updated medication list for this problem includes:    Plavix 75 Mg Tabs (Clopidogrel bisulfate) .Marland Kitchen... 1 daily by mouth    Aspirin 81 Mg Tbec (Aspirin) .Marland Kitchen... Take one tablet by mouth daily    Norvasc 10 Mg Tabs (Amlodipine besylate) .Marland Kitchen... Take one tab by mouth once daily    Toprol Xl 25 Mg Xr24h-tab (Metoprolol succinate) .Marland Kitchen... 1 by mouth daily    Isosorbide Mononitrate Cr 30 Mg Xr24h-tab (Isosorbide mononitrate) .Marland Kitchen... Take 1 tablet by mouth once a day    Benazepril Hcl 20 Mg Tabs (Benazepril hcl) .Marland Kitchen... Take 1 tablet by mouth once a day    Nitrostat 0.4 Mg Subl (Nitroglycerin) .Marland Kitchen... 1 tablet under tongue at onset of chest  pain; you may repeat every 5 minutes for up to 3 doses.  Her updated medication list for this problem includes:    Plavix 75 Mg Tabs (Clopidogrel bisulfate) .Marland Kitchen... 1 daily by mouth    Aspirin 81 Mg Tbec (Aspirin) .Marland Kitchen... Take one tablet by mouth daily    Norvasc 10 Mg Tabs (Amlodipine besylate) .Marland Kitchen... 1 daily    Toprol Xl 25 Mg Xr24h-tab (Metoprolol succinate) .Marland Kitchen... 1 by mouth daily    Isosorbide Mononitrate Cr 30 Mg Xr24h-tab (Isosorbide mononitrate) .Marland Kitchen... Take 1 tablet by mouth once a day    Benazepril Hcl 20 Mg Tabs (Benazepril hcl) .Marland Kitchen... Take 1 tablet  by mouth once a day    Nitrostat 0.4 Mg Subl (Nitroglycerin) .Marland Kitchen... 1 tablet under tongue at onset of chest pain; you may repeat every 5 minutes for up to 3 doses.  Orders: EKG w/ Interpretation (93000) TLB-BMP (Basic Metabolic Panel-BMET) (80048-METABOL) TLB-Hepatic/Liver Function Pnl (80076-HEPATIC) TLB-CBC Platelet - w/Differential (85025-CBCD)  Problem # 3:  HYPERTENSION (ICD-401.9) Or blood pressure slightly elevated today but she has had problems with postural hypotension. We will continue current treatment. Her updated medication list for this problem includes:    Furosemide 80 Mg Tabs (Furosemide) .Marland Kitchen... Take one and 1/2 tablets in the morning and one tablet in the evening    Cozaar 50 Mg Tabs (Losartan potassium) .Marland Kitchen... 1 twice daily    Aspirin 81 Mg Tbec (Aspirin) .Marland Kitchen... Take one tablet by mouth daily    Norvasc 10 Mg Tabs (Amlodipine besylate) .Marland Kitchen... Take one tab by mouth once daily    Toprol Xl 25 Mg Xr24h-tab (Metoprolol succinate) .Marland Kitchen... 1 by mouth daily    Benazepril Hcl 20 Mg Tabs (Benazepril hcl) .Marland Kitchen... Take 1 tablet by mouth once a day  Her updated medication list for this problem includes:    Furosemide 80 Mg Tabs (Furosemide) .Marland Kitchen... Take one and 1/2 tablets in the morning and one tablet in the evening    Cozaar 50 Mg Tabs (Losartan potassium) .Marland Kitchen... 1 twice daily    Aspirin 81 Mg Tbec (Aspirin) .Marland Kitchen... Take one tablet by mouth  daily    Norvasc 10 Mg Tabs (Amlodipine besylate) .Marland Kitchen... Take one tab by mouth once daily    Toprol Xl 25 Mg Xr24h-tab (Metoprolol succinate) .Marland Kitchen... 1 by mouth daily    Benazepril Hcl 20 Mg Tabs (Benazepril hcl) .Marland Kitchen... Take 1 tablet by mouth once a day  Patient Instructions: 1)  Your physician recommends that you schedule a follow-up appointment in: 3 months. 2)  Your physician recommends that you have lab work today: bmet/liver/cbc (428.22;414.01;402.10) 3)  You will need repeat labwork in 1 week: bmet (428.22;414.01;402.10) 4)  Increase lasix (furosemide) to 120mg  in the morning and 80mg  in the evening. 5)  Start Pepcid 20mg  two times a day. 6)  Decrease Vytorin to 10/20mg  once daily. 7)  Followup with Dr. Dallas Schimke regarding your vomiting after eating. Prescriptions: VYTORIN 10-20 MG TABS (EZETIMIBE-SIMVASTATIN) take one tab by mouth once daily  #90 x 3   Entered by:   Sherri Rad, RN, BSN   Authorized by:   Lenoria Farrier, MD, [facility]   Signed by:   Sherri Rad, RN, BSN on 10/31/2009   Method used:   Electronically to        [facility]* (retail)             ,          Ph: [phone]       Fax: [phone]   RxID:   [id number] PLAVIX 75 MG TABS (CLOPIDOGREL BISULFATE) 1 daily by mouth  #90 x 3   Entered by:   Sherri Rad, RN, BSN   Authorized by:   Lenoria Farrier, MD, [facility]   Signed by:   Sherri Rad, RN, BSN on 10/31/2009   Method used:   Electronically to        [facility]* (retail)             ,          Ph: [phone]       Fax: [phone]   RxID:   [id number] NORVASC 10  MG TABS (AMLODIPINE BESYLATE) take one tab by mouth once daily  #90 x 3   Entered by:   Sherri Rad, RN, BSN   Authorized by:   Lenoria Farrier, MD, [facility]   Signed by:   Sherri Rad, RN, BSN on 10/31/2009   Method used:   Electronically to        [facility]* (retail)             ,          Ph: [phone]       Fax: [phone]   RxID:    [id number]

## 2010-05-01 NOTE — Progress Notes (Signed)
Summary: needs to increase oxygen  Phone Note From Other Clinic   Caller: Abran Duke, PT with  [facility]  [phone] Summary of Call: Physical therapist states pt's oxygen level is set at one litre and when pt gets up and moves around the level drops to the high 80's.  Therapist is asking if she can increase to 2 litres. Initial call taken by: Lowella Petties CMA,  November 13, 2009 11:29 AM  Follow-up for Phone Call        yes, please increase to 2 L Follow-up by: Hannah Beat MD,  November 13, 2009 1:44 PM  Additional Follow-up for Phone Call Additional follow up Details #1::        PT advised of orders Additional Follow-up by: Benny Lennert CMA Duncan Dull),  November 13, 2009 4:23 PM

## 2010-05-01 NOTE — Progress Notes (Signed)
Summary: form regarding potassium  Phone Note From Pharmacy   Caller: [facility] Summary of Call: Form from [facility] is on your desk, regarding potassium scripts. Initial call taken by: Lowella Petties CMA,  January 08, 2010 4:34 PM  Follow-up for Phone Call        decrease to 10 meq daily.   (for now, have [name] only take 1/2 a packet of potassium daily)  Kidney function is mildly elevated - not like before, but a little bit higher. Potassium is minimally high.  Still a little bit anemic. Keep taking the iron. Follow-up by: Hannah Beat MD,  January 08, 2010 4:52 PM  Additional Follow-up for Phone Call Additional follow up Details #1::        Spoke with patient. She was advised.      Appended Document: form regarding potassium Patient advised and she will start taking 10 meq.[name] CMA    Appended Document: form regarding potassium    Clinical Lists Changes  Medications: Changed medication from KLOR-CON 20 MEQ PACK (POTASSIUM CHLORIDE) 1 tab by mouth daily to KLOR-CON 10 10 MEQ CR-TABS (POTASSIUM CHLORIDE) take one tablet daily - Signed Rx of KLOR-CON 10 10 MEQ CR-TABS (POTASSIUM CHLORIDE) take one tablet daily;  #90 x 3;  Signed;  Entered by: Benny Lennert CMA (AAMA);  Authorized by: Hannah Beat MD;  Method used: Electronically to [facility]*, , ,   , Ph: [phone], Fax: [phone]    Prescriptions: KLOR-CON 10 10 MEQ CR-TABS (POTASSIUM CHLORIDE) take one tablet daily  #90 x 3   Entered by:   Benny Lennert CMA (AAMA)   Authorized by:   Hannah Beat MD   Signed by:   Benny Lennert CMA (AAMA) on 01/11/2010   Method used:   Electronically to        [facility]* (retail)             ,          Ph: [phone]       Fax: [phone]   RxID:   [id number]    Prior Medications: FUROSEMIDE 80 MG TABS (FUROSEMIDE) 1 tab two times a day VYTORIN 10-40 MG TABS (EZETIMIBE-SIMVASTATIN) take 1/2 tab  once daily SERTRALINE HCL  100 MG TABS (SERTRALINE HCL) Take 1 tablet by mouth once a day NORVASC 10 MG TABS (AMLODIPINE BESYLATE) take one tab by mouth once daily TOPROL XL 25 MG XR24H-TAB (METOPROLOL SUCCINATE) 1 by mouth daily MIDODRINE HCL 2.5 MG TABS (MIDODRINE HCL) 1 by mouth three times a day ISOSORBIDE MONONITRATE CR 30 MG XR24H-TAB (ISOSORBIDE MONONITRATE) Take 1 tablet by mouth once a day XALATAN 0.005 % SOLN (LATANOPROST) One drop each eye daily NITROSTAT 0.4 MG SUBL (NITROGLYCERIN) 1 tablet under tongue at onset of chest pain; you may repeat every 5 minutes for up to 3 doses. PANTOPRAZOLE SODIUM 40 MG TBEC (PANTOPRAZOLE SODIUM) 1 tab once daily KETOROLAC OPHTHALIMIC SOLUTION 0.4 % () Rt eye 4 times a day OXYCODONE HCL 5 MG TABS (OXYCODONE HCL) 1 tab as needed for pain ANUSOL SUPPOSITORY () qhs COMBIVENT 18-103 MCG/ACT AERO (IPRATROPIUM-ALBUTEROL) 1 puff bid ALBUTEROL NEBULIZER () twice a day FERROUS SULFATE 325 (65 FE) MG TABS (FERROUS SULFATE) take one tab by mouth two times a day BENAZEPRIL HCL 10 MG TABS (BENAZEPRIL HCL) 1 by mouth daily DULERA 200-5 MCG/ACT AERO (MOMETASONE FURO-FORMOTEROL FUM) 2 puffs two times a day MIRTAZAPINE 15 MG TABS (MIRTAZAPINE) 1 by mouth at bedtime Current Allergies: !  PCN ! CODEINE ! DEMEROL ! MORPHINE ! DARVON ! SULFA ! * NIASPAN

## 2010-05-01 NOTE — Miscellaneous (Signed)
Summary: PT Orders/[facility]  PT Orders/[facility]   Imported By: Sherian Rein 01/30/2010 08:27:08  _____________________________________________________________________  External Attachment:    Type:   Image     Comment:   External Document

## 2010-05-01 NOTE — Assessment & Plan Note (Signed)
Summary: ROA FOR 6-7 WEEK FOLLOW-UP/JRR   Vital Signs:  Patient profile:   77 year old female Height:      62 inches Weight:      203.6 pounds BMI:     37.37 Temp:     97.9 degrees F oral Pulse rate:   60 / minute Pulse rhythm:   regular BP sitting:   160 / 70  (left arm) Cuff size:   large  Vitals Entered By: Benny Lennert CMA Duncan Dull) (June 19, 2009 10:29 AM)  History of Present Illness: Chief complaint return office visit for wrist pain 6-7 week follow up  R adhesive capsulitis:  R adhesive capsulitis progress has been limited due to fracture in R arm and in cast  Still with pain, Loss of motion and pain at night   ROS: no fever, chills, sweats, no fatigue, pleasant  Allergies: 1)  ! Pcn 2)  ! Codeine 3)  ! Demerol 4)  ! Morphine 5)  ! Darvon 6)  ! Sulfa 7)  ! * Niaspan  Physical Exam  General:  Well-developed,well-nourished,in no acute distress; alert,appropriate and cooperative throughout examination Head:  normocephalic, atraumatic, and no abnormalities observed.  Ears:  no external deformities.   Nose:  no external deformity.   Lungs:  normal respiratory effort.   Msk:  mildly ttp at ac mildly ttp at bicpital groove  + neer and hawkins  R shoulder, abduction to 155 degrees Minimal to no loss of EROM Flexion to 145 IROM, loss of 50 degrees   Impression & Recommendations:  Problem # 1:  ADHESIVE CAPSULITIS, RIGHT (ICD-726.0) Assessment Improved  ROM improved. subjective pain  formal PT resumption on Wed.  Intrarticular Shoulder Injection Verbal consent was obtained from the patient. Risks, benefits, and alternatives were explained. Patient prepped with Betadine and Ethyl Chloride used for anesthesia. An intraarticular shoulder injection was performed using the posterior approach. The patient tolerated the procedure well and had decreased pain post injection. No complications. Injection: 9 cc of Marcaine 0.5% and 1cc of Kenalog 40 mg. Needle:  22 gauge   Orders: Joint Aspirate / Injection, Large (20610) Kenalog 10mg  (4units) (J3301)  Complete Medication List: 1)  Furosemide 80 Mg Tabs (Furosemide) .... Take one & a half tabs every morning and one tab every evening. 2)  Neurontin 300 Mg Caps (Gabapentin) .Marland Kitchen.. 1 tablet by mouth three times a day 3)  Cozaar 50 Mg Tabs (Losartan potassium) .Marland Kitchen.. 1 twice daily 4)  Vytorin 10-40 Mg Tabs (Ezetimibe-simvastatin) .Marland Kitchen.. 1 daily by mouth 5)  Plavix 75 Mg Tabs (Clopidogrel bisulfate) .Marland Kitchen.. 1 daily by mouth 6)  Vitamin B-12 500 Mcg Tabs (Cyanocobalamin) .Marland Kitchen.. 1 daily by mouth 7)  Aspirin 81 Mg Tbec (Aspirin) .... Take one tablet by mouth daily 8)  Sertraline Hcl 100 Mg Tabs (Sertraline hcl) .... Daily 9)  Norvasc 10 Mg Tabs (Amlodipine besylate) .Marland Kitchen.. 1 daily 10)  Toprol Xl 25 Mg Xr24h-tab (Metoprolol succinate) .Marland Kitchen.. 1 by mouth daily 11)  Midodrine Hcl 2.5 Mg Tabs (Midodrine hcl) .Marland Kitchen.. 1 by mouth three times a day 12)  Isosorbide Mononitrate Cr 30 Mg Xr24h-tab (Isosorbide mononitrate) .... Take 1 tablet by mouth once a day 13)  Tylenol 325 Mg Tabs (Acetaminophen) .... Otc as directed. 14)  Xalatan 0.005 % Soln (Latanoprost) .... One drop each eye daily 15)  Iron 325 (65 Fe) Mg Tabs (Ferrous sulfate) .... Take 1 tablet by mouth once a day 16)  Lotensin 10 Mg Tabs (Benazepril hcl) .... Take 1  tablet by mouth once a day 17)  Folic Acid Powd (Folic acid) .... One daily 18)  Flexeril 5 Mg Tabs (Cyclobenzaprine hcl) .... One tab by mouth three times a day  Patient Instructions: 1)  f/u 6 weeks 2)  After you have an injection of any joint, the numbing medicine will make it feel better for a few hours. Later tonight, it is common for the joint to feel worse. The steroid will take 48-72 hours to start working - it is the thing that will likely provide the most relief. 3)  Ice the joint where you had the injection at least 2-3 times a day for 20 minutes for 3 days. If have had swelling, pain in the joint  itself, ice for 1 week. 4)  You can use an ice bag, frozen peas or corn, an ice pack - all work   Current Allergies (reviewed today): ! PCN ! CODEINE ! DEMEROL ! MORPHINE ! DARVON ! SULFA ! * NIASPAN

## 2010-05-01 NOTE — Progress Notes (Signed)
Summary: Nausea and diarrhea  Phone Note From Other Clinic Call back at [phone]   Caller: Rachel/[facility] Call For: Dr. Patsy Lager Summary of Call: Patient has been feeling sick on her stomach for the last few days Temp 97.5, HR 56, BP 154/56, weight is down 3 pounds this week and the swelling is down in her feet.  Patient now has vomitting and diarrhea, but was able to keep her pills down this morning. Fleet Contras thinks that she may be able to keep a pill down if she can have something for the nausea. Pharmacy-[facility]  If you call something in for her let Asher Muir know in the front office to pick it up today on her way home from work. Initial call taken by: Sydell Axon LPN,  December 06, 2009 1:03 PM  Follow-up for Phone Call        I think this is OK -- will call in some zofran Hannah Beat MD  December 06, 2009 1:29 PM     New/Updated Medications: ONDANSETRON 4 MG TBDP (ONDANSETRON) 1 tab by mouth q 8 hours - can disintigrate under tongue Prescriptions: ONDANSETRON 4 MG TBDP (ONDANSETRON) 1 tab by mouth q 8 hours - can disintigrate under tongue  #30 x 0   Entered and Authorized by:   Hannah Beat MD   Signed by:   Hannah Beat MD on 12/06/2009   Method used:   Electronically to        [facility]  [place] (retail)       [address]       [place], [place]  [address]       Ph: [phone] or [phone]       Fax: [phone]   RxID:   [id number]

## 2010-05-01 NOTE — Letter (Signed)
Summary: Anticoagulation Modification Letter  [facility]  [address] [place], [place] [address]   Phone: [phone]  Fax: [phone]    November 08, 2009  Re:    Donna Reese DOB:    Jul 14, 1931 MRN:    [id number]    Dear Dr. Charlies Constable:  We  are scheduling this patient for a Colonoscopy with Dr. Lina Sar.  The procedure has not been scheduled as of yet.  The date will  be determined later.  Our records show that  she is on anticoagulation therapy. Please advise as to how long the patient may come off their therapy of Plavix prior to the Colonoscopy.   Please fax back/or route the completed form to [facility] CMA at [phone] .  Thank you for your help with this matter.  Sincerely,      Dr. Lina Sar   Physician Recommendation:  Hold Plavix 7 days prior ________________  Hold Coumadin 5 days prior ____________  Other ______________________________

## 2010-05-01 NOTE — Letter (Signed)
Summary: CMN for Oxygen/[facility]  CMN for Oxygen/[facility]   Imported By: Lanelle Bal 11/27/2009 09:33:10  _____________________________________________________________________  External Attachment:    Type:   Image     Comment:   External Document

## 2010-05-01 NOTE — Progress Notes (Signed)
Summary: need direction for medication pt wants 90 day supply  Phone Note From Pharmacy Call back at [phone] ref# [id number]   Caller: [facility] Summary of Call: calling for direction for medication Initial call taken by: Judie Grieve,  February 19, 2010 9:36 AM  Follow-up for Phone Call        I called and spoke with the pharmacist at [facility]. They needed authorization for #180 tabs. I did give the ok to do this with 3 refills. Follow-up by: Sherri Rad, RN, BSN,  February 19, 2010 2:09 PM

## 2010-05-01 NOTE — Assessment & Plan Note (Signed)
Summary: ACU POSSIBLE GI BLEED/JRR PER HEATHER   Vital Signs:  Patient profile:   77 year old female Height:      62 inches Weight:      194.4 pounds BMI:     35.68 Temp:     98.7 degrees F oral Pulse rate:   88 / minute Pulse rhythm:   regular BP sitting:   160 / 70  (left arm) Cuff size:   large  Vitals Entered By: Benny Lennert CMA Duncan Dull) (November 06, 2009 10:27 AM)  History of Present Illness: Chief complaint acute per [place]  77 year old female patient with CAD, h/o bleeding ulcer distantly with dramatic drop in hemoglobin to 8.8 on 11/03/2009 compared to 11.5 Hgb on 03/06/2009.  upon questioning, the patient does have some melena stools, does have some coffee-ground emesis as well. Denies a prior red blood per rectum   Feels a very weak and lightheaded compared to her normal, usual very alert, friendly, and robust 77 year old self  She does have a history of significant GI bleed in 2009.  She also does have a history of CAD, status post CABG.  Additionally, patient does have some complaints of shortness of breath, and some chest pain, it is reproduced in motion.  Hemoccult cards in the office and Hemoccult cards that were sent with the patient several days ago are positive.  Allergies: 1)  ! Pcn 2)  ! Codeine 3)  ! Demerol 4)  ! Morphine 5)  ! Darvon 6)  ! Sulfa 7)  ! * Niaspan  Past History:  Past medical, surgical, family and social histories (including risk factors) reviewed, and no changes noted (except as noted below).  Past Medical History: Reviewed history from 10/30/2008 and no changes required. Diabetes mellitus, type II 8/05 Diverticulosis, colon inflamed hemorrhoids 03/13/06 Osteopenia 09/1999, Dexa 8/05 Osteopenia Peptic ulcer disease Peripheral vascular disease 1. Diastolic heart failure 2. Coronary artery disease, status post prior coronary bypass graft     surgery with  patent grafts  2010 3. Renovascular hypertension. 4. Renal artery  stenosis, status post recent renal artery stenting on     the left. 5. History of paroxysmal atrial fibrillation, not a Coumadin     candidate. 6. History of deep vein thrombophlebitis and pulmonary embolism,     status post IV filter placement. 7. History of gastrointestinal bleeding. 8. Diabetes. 9. Hypertension. 10. S/P AAA repair 10.Hyperlipidemia.        . .  Past Surgical History: Reviewed history from 11/22/2008 and no changes required. Appendectomy Coronary artery bypass graft 09/04/01 Cataract extraction Both eyes with IOL 2001 Cholecystectomy Hysterectomy (total) Carotid U/S unchanged 40-50% ICA stenosis--12/1998 Myoview [place]. Neg EF 76%--06/26/2004 Carotid doppler 12/05, unchanged 9/06 MRI Lumbar Spine Neg, Neurosurg 10/04 Cardiac cath 09/03/01 Stress cardiolite LVF 81%--8/01,  3/06 neg Renal U/S Unsatisfactory study 8/01 L Heart Cath 01/20/06 AAA Repair 1997 Myocardial perfusion EF 67% 10/07 EGD Abn. 12/07--esophageal stricture 1/09--06/30/07--gastritis and major bleed Colonoscopy Abn. 03/13/06 EMG--06/17/07--severd  DM neuropathy and radiculopathy---[facility]--06/27/2008--feet, abnormal MRI back--3/09--old compression fx L1, DDD--[facility] placement of inferior vena cava filter --07/03/07--[place] blood transfusions x6 with GI bleed 3/27-07/03/07 Adenosin stress test 08/17/07---low risk stent to R kidney 8/09 renal artery doppler--11/17/2008--severe L common iliac stenosis  Family History: Reviewed history from 08/23/2008 and no changes required.  Mother deceased from cancer.  Father deceased from a   CVA.  She had one brother who died from CVA and 3 brothers who died from  MI.   Social History: Reviewed history from 08/23/2008 and no changes required. She lives in [place].  She is married.  She quit   smoking in 1992, but she has a former history of 40-pack years.   Negative for EtOH.  Negative for drug use.  She uses a walker in the    nursing home for ambulation.      Review of Systems       Otherwise, the pertinent positives and negatives are listed above and in the HPI, otherwise a full review of systems has been reviewed and is negative unless noted positive.  General:  Complains of fatigue. Resp:  Complains of shortness of breath. GI:  See HPI; Complains of dark tarry stools, nausea, and vomiting; denies abdominal pain and bloody stools. Heme:  Complains of bleeding and pallor; denies abnormal bruising and enlarge lymph nodes.  Physical Exam  General:  alert and well-developed.  pale, mildly weak compared to prior exam Head:  Normocephalic and atraumatic without obvious abnormalities. No apparent alopecia or balding. Eyes:  pupils equal, pupils round, and pupils reactive to light.  pale conjunctiva Ears:  External ear exam shows no significant lesions or deformities.  Otoscopic examination reveals clear canals, tympanic membranes are intact bilaterally without bulging, retraction, inflammation or discharge. Hearing is grossly normal bilaterally. Nose:  External nasal examination shows no deformity or inflammation. Nasal mucosa are pink and moist without lesions or exudates. Mouth:  Oral mucosa and oropharynx without lesions or exudates.  Teeth in good repair. Neck:  No deformities, masses, or tenderness noted. Lungs:  Normal respiratory effort, chest expands symmetrically. Lungs are clear to auscultation, no crackles or wheezes. Extremities:  tr edema Neurologic:  alert & oriented X3 and gait normal.   Cervical Nodes:  No lymphadenopathy noted Psych:  Cognition and judgment appear intact. Alert and cooperative with normal attention span and concentration. No apparent delusions, illusions, hallucinations   Impression & Recommendations:  Problem # 1:  GI BLEEDING (ICD-578.9) a brother patient into the office acutely, given her acute weakness, anemia, emesis of coffee grounds, melanotic stools, and Hemoccult  positivity, I arranged for direct admission to the hospital. Discussed the case with the hospitalist, and also previously called gastroenterology to discuss the case with him.  Given her hemoglobin is 8 and a history of coronary disease, would transfuse, type and cross his patient.  Consult GI for evaluation on GI bleed.  Problem # 2:  ANEMIA, NORMOCYTIC (ICD-285.9)  Her updated medication list for this problem includes:    Ferrous Sulfate 325 (65 Fe) Mg Tbec (Ferrous sulfate) .Marland Kitchen... 1 tab by mouth two times a day  Problem # 3:  NAUSEA AND VOMITING (ICD-787.01)  Problem # 4:  CORONARY ATHEROSCLEROSIS NATIVE CORONARY ARTERY (ICD-414.01)  Her updated medication list for this problem includes:    Furosemide 80 Mg Tabs (Furosemide) .Marland Kitchen... Take one and 1/2 tablets in the morning and one tablet in the evening    Cozaar 50 Mg Tabs (Losartan potassium) .Marland Kitchen... 1 twice daily    Plavix 75 Mg Tabs (Clopidogrel bisulfate) .Marland Kitchen... 1 daily by mouth    Aspirin 81 Mg Tbec (Aspirin) .Marland Kitchen... Take one tablet by mouth daily    Norvasc 10 Mg Tabs (Amlodipine besylate) .Marland Kitchen... Take one tab by mouth once daily    Toprol Xl 25 Mg Xr24h-tab (Metoprolol succinate) .Marland Kitchen... 1 by mouth daily    Isosorbide Mononitrate Cr 30 Mg Xr24h-tab (Isosorbide mononitrate) .Marland Kitchen... Take 1 tablet by mouth once a day  Benazepril Hcl 20 Mg Tabs (Benazepril hcl) .Marland Kitchen... Take 1 tablet by mouth once a day    Nitrostat 0.4 Mg Subl (Nitroglycerin) .Marland Kitchen... 1 tablet under tongue at onset of chest pain; you may repeat every 5 minutes for up to 3 doses.  Problem # 5:  GI BLEED (ICD-578.9) h/o  Problem # 6:  DIASTOLIC HEART FAILURE, CHRONIC (ICD-428.32)  Her updated medication list for this problem includes:    Furosemide 80 Mg Tabs (Furosemide) .Marland Kitchen... Take one and 1/2 tablets in the morning and one tablet in the evening    Cozaar 50 Mg Tabs (Losartan potassium) .Marland Kitchen... 1 twice daily    Plavix 75 Mg Tabs (Clopidogrel bisulfate) .Marland Kitchen... 1 daily by mouth     Aspirin 81 Mg Tbec (Aspirin) .Marland Kitchen... Take one tablet by mouth daily    Toprol Xl 25 Mg Xr24h-tab (Metoprolol succinate) .Marland Kitchen... 1 by mouth daily    Benazepril Hcl 20 Mg Tabs (Benazepril hcl) .Marland Kitchen... Take 1 tablet by mouth once a day  Complete Medication List: 1)  Furosemide 80 Mg Tabs (Furosemide) .... Take one and 1/2 tablets in the morning and one tablet in the evening 2)  Cozaar 50 Mg Tabs (Losartan potassium) .Marland Kitchen.. 1 twice daily 3)  Vytorin 10-20 Mg Tabs (Ezetimibe-simvastatin) .... Take one tab by mouth once daily 4)  Plavix 75 Mg Tabs (Clopidogrel bisulfate) .Marland Kitchen.. 1 daily by mouth 5)  Aspirin 81 Mg Tbec (Aspirin) .... Take one tablet by mouth daily 6)  Sertraline Hcl 100 Mg Tabs (Sertraline hcl) .... Daily 7)  Norvasc 10 Mg Tabs (Amlodipine besylate) .... Take one tab by mouth once daily 8)  Toprol Xl 25 Mg Xr24h-tab (Metoprolol succinate) .Marland Kitchen.. 1 by mouth daily 9)  Midodrine Hcl 2.5 Mg Tabs (Midodrine hcl) .Marland Kitchen.. 1 by mouth three times a day 10)  Isosorbide Mononitrate Cr 30 Mg Xr24h-tab (Isosorbide mononitrate) .... Take 1 tablet by mouth once a day 11)  Xalatan 0.005 % Soln (Latanoprost) .... One drop each eye daily 12)  Benazepril Hcl 20 Mg Tabs (Benazepril hcl) .... Take 1 tablet by mouth once a day 13)  Nitrostat 0.4 Mg Subl (Nitroglycerin) .Marland Kitchen.. 1 tablet under tongue at onset of chest pain; you may repeat every 5 minutes for up to 3 doses. 14)  Pepcid 20 Mg Tabs (Famotidine) .... Take one tab by mouth two times a day 15)  Ferrous Sulfate 325 (65 Fe) Mg Tbec (Ferrous sulfate) .Marland Kitchen.. 1 tab by mouth two times a day  Current Allergies (reviewed today): ! PCN ! CODEINE ! DEMEROL ! MORPHINE ! DARVON ! SULFA ! * NIASPAN

## 2010-05-01 NOTE — Progress Notes (Signed)
Summary: triage  Phone Note From Other Clinic Call back at [phone]   Caller: Shirlee Limerick, scheduler Call For: Dr. Christella Hartigan Reason for Call: Schedule Patient Appt Summary of Call: pt sch'ed in [facility], but needs sooner per [facility] Initial call taken by: Vallarie Mare,  November 06, 2009 9:47 AM  Follow-up for Phone Call        Pt has a GI bleed Hgb 8.8  Hem cards turned in today. nausea, vomiting, weakness.  No dizziness.  Pt is having coffee ground emesis, and dark stools.  I spoke with Willette Cluster NP she advised the pt may have an active GI bleed and needs to be seen in the ER. Follow-up by: Chales Abrahams CMA Duncan Dull),  November 06, 2009 10:18 AM  Additional Follow-up for Phone Call Additional follow up Details #1::        I spoke with Dr. Dallas Schimke , PCP, he will be seeing patient in office today. He will admit and consult [place] if needed.  If not admitted  I will be happy to see patient in office. HE will let us know. Additional Follow-up by: Willette Cluster NP,  November 07, 2009 12:01 PM

## 2010-05-01 NOTE — Letter (Signed)
Summary: Patient Notice- Polyp Results  [facility]  [address] [place], [place] [address]   Phone: [phone]  Fax: [phone]        November 29, 2009 MRN: [id number]    ANIKAH HOGGE [address] [place], [place]  [address]    Dear Ms. Ury,  I am pleased to inform you that the colon polyp(s) removed during your recent colonoscopy was (were) found to be benign (no cancer detected) upon pathologic examination.The polyp was adenomatous ( precancerous) 5 I recommend you have a repeat colonoscopy examination in _ years to look for recurrent polyps, as having colon polyps increases your risk for having recurrent polyps or even colon cancer in the future.  Should you develop new or worsening symptoms of abdominal pain, bowel habit changes or bleeding from the rectum or bowels, please schedule an evaluation with either your primary care physician or with me.  Additional information/recommendations:  _x_ No further action with gastroenterology is needed at this time. Please      follow-up with your primary care physician for your other healthcare      needs.  __ Please call [phone] to schedule a return visit to review your      situation.  __ Please keep your follow-up visit as already scheduled.  __ Continue treatment plan as outlined the day of your exam.  Please call us if you are having persistent problems or have questions about your condition that have not been fully answered at this time.  Sincerely,  Hart Carwin MD  This letter has been electronically signed by your physician.  Appended Document: Patient Notice- Polyp Results letter mailed to patient's home

## 2010-05-01 NOTE — Progress Notes (Signed)
Summary: needs Rx for Gabapentin  ---- Converted from flag ---- ---- 04/05/2009 2:52 PM, Sherri Rad, RN, BSN wrote: Donna Reese. This pt has contacted [place] to try and have Dr Charlies Constable to refill her Gabapentin 300mg  to [facility]. Dr. Juanda Chance doesn't usually fill these meds. Can you take care of this refill for her? Thank you, [name] for Dr. Juanda Chance. ------------------------------       Additional Follow-up for Phone Call Additional follow up Details #2::    will call in the gabapentin and needs appt wiht Dr Patsy Lager to check R shoulder   [name] FNP  April 05, 2009 4:00 PM   Roney Mans at Dr. Regino Schultze office and told her [name] would take care of rx for pt. I called pt and she wanted 3 months supply with one yr of refill called in. [name] said she would call in #90 (one month supply) until pt could see Dr. Patsy Lager.  Pt made appt to see Dr. Patsy Lager on 04/13/09 at 10:45am. Pt said she had enough med to last until then and did not want me to call rx to [facility] or to a local pharmacy. She said she would get rx for one yr from Dr. Patsy Lager.[name] LPN  April 05, 2009 4:35 PM   Prescriptions: NEURONTIN 300 MG  CAPS (GABAPENTIN) 1 tablet by mouth three times a day  #90 x 0   Entered and Authorized by:   Gildardo Griffes FNP   Signed by:   Gildardo Griffes FNP on 04/05/2009   Method used:   Telephoned to ...       [facility]* (mail-order)             ,          Ph: [phone]       Fax: [phone]   RxID:   [id number]

## 2010-05-01 NOTE — Miscellaneous (Signed)
Summary: Orders/[facility]  Orders/[facility]   Imported By: Sherian Rein 01/30/2010 08:27:56  _____________________________________________________________________  External Attachment:    Type:   Image     Comment:   External Document

## 2010-05-01 NOTE — Assessment & Plan Note (Signed)
Summary: ROV   Visit Type:  Follow-up Primary Provider:  Hannah Beat MD  CC:  toe nails turn blue- feet redness- bilateral leg pan and edema.  History of Present Illness: 77 year old woman presents for followup evaluation. She has a complex history which includes CAD s/p CABG, PAD with prior AAA repair and now with intermittent claudication, renal artery stenosis s/p stenting, IVC filter placement for DVT/PE in setting chronic bleeding from AVM's, and marked orthostasis with resting malignant HTN.  She complains of bilateral leg pain and swelling as well as foot redness. She is able to walk but has become unsteady on her feet. She denies foot ulceration. She does not have typical calf pain with walking, but admits to diffuse leg pain.  No chest pain or dyspnea at present. No other complaints.  Current Medications (verified): 1)  Furosemide 80 Mg Tabs (Furosemide) .... Take Two in The Morning and Take 1 1/2 Tablest Pm 2)  Cozaar 50 Mg Tabs (Losartan Potassium) .Marland Kitchen.. 1 Twice Daily 3)  Vytorin 10-40 Mg Tabs (Ezetimibe-Simvastatin) .... Take One Tablet By Mouth Dailyat Bedtime 4)  Plavix 75 Mg Tabs (Clopidogrel Bisulfate) .Marland Kitchen.. 1 Daily By Mouth 5)  Sertraline Hcl 100 Mg Tabs (Sertraline Hcl) .... Take 1 Tablet By Mouth Once A Day 6)  Norvasc 10 Mg Tabs (Amlodipine Besylate) .... Take One Tab By Mouth Once Daily 7)  Toprol Xl 25 Mg Xr24h-Tab (Metoprolol Succinate) .Marland Kitchen.. 1 By Mouth Daily 8)  Midodrine Hcl 2.5 Mg Tabs (Midodrine Hcl) .Marland Kitchen.. 1 By Mouth Three Times A Day 9)  Isosorbide Mononitrate Cr 30 Mg Xr24h-Tab (Isosorbide Mononitrate) .... Take 1 Tablet By Mouth Once A Day 10)  Xalatan 0.005 % Soln (Latanoprost) .... One Drop Each Eye Daily 11)  Benazepril Hcl 20 Mg Tabs (Benazepril Hcl) .... Take 1 Tablet By Mouth Once A Day 12)  Nitrostat 0.4 Mg Subl (Nitroglycerin) .Marland Kitchen.. 1 Tablet Under Tongue At Onset of Chest Pain; You May Repeat Every 5 Minutes For Up To 3 Doses. 13)  Ferrous Sulfate 325  (65 Fe) Mg Tbec (Ferrous Sulfate) .Marland Kitchen.. 1 Tab By Mouth Two Times A Day 14)  Omeprazole 40 Mg Cpdr (Omeprazole) .... Take One By Mouth Two Times A Day 15)  Miralax   Powd (Polyethylene Glycol 3350) .... As Per Prep  Instructions. 16)  Dulcolax 5 Mg  Tbec (Bisacodyl) .... Day Before Procedure Take 2 At 3pm and 2 At 8pm. 17)  Klor-Con 20 Meq Pack (Potassium Chloride) .Marland Kitchen.. 1 Tab By Mouth Daily 18)  Zaroxolyn 5 Mg Tabs (Metolazone) .Marland Kitchen.. 1 Tab By Mouth Daily  Allergies: 1)  ! Pcn 2)  ! Codeine 3)  ! Demerol 4)  ! Morphine 5)  ! Darvon 6)  ! Sulfa 7)  ! * Niaspan  Past History:  Past medical history reviewed for relevance to current acute and chronic problems.  Past Medical History: Reviewed history from 10/30/2008 and no changes required. Diabetes mellitus, type II 8/05 Diverticulosis, colon inflamed hemorrhoids 03/13/06 Osteopenia 09/1999, Dexa 8/05 Osteopenia Peptic ulcer disease Peripheral vascular disease 1. Diastolic heart failure 2. Coronary artery disease, status post prior coronary bypass graft     surgery with  patent grafts  2010 3. Renovascular hypertension. 4. Renal artery stenosis, status post recent renal artery stenting on     the left. 5. History of paroxysmal atrial fibrillation, not a Coumadin     candidate. 6. History of deep vein thrombophlebitis and pulmonary embolism,     status post IV  filter placement. 7. History of gastrointestinal bleeding. 8. Diabetes. 9. Hypertension. 10. S/P AAA repair 10.Hyperlipidemia.        . .  Review of Systems       Negative except as per HPI   Vital Signs:  Patient profile:   77 year old female Height:      62 inches Weight:      182.50 pounds BMI:     33.50 Pulse rate:   60 / minute Pulse rhythm:   regular Resp:     20 per minute BP sitting:   130 / 54  (left arm) Cuff size:   large  Vitals Entered By: Vikki Ports (November 30, 2009 9:19 AM)  Serial Vital Signs/Assessments:  Time      Position  BP        Pulse  Resp  Temp     By           R Arm     140/52                         Vikki Ports   Physical Exam  General:  Pt is alert and oriented, in no acute distress. HEENT: normal Neck: normal carotid upstrokes with bilateral bruits right greater than left, JVP normal Lungs: CTA CV: RRR without murmur or gallop Abd: soft, NT, positive BS, no bruit, no organomegaly Ext: no clubbing, cyanosis, or edema. femoral pulses 1+ right, trace left. pedal pulses not palpable. toes have a petechial rash. positive dependent rubor. Skin: warm and dry without rash    Impression & Recommendations:  Problem # 1:  PERIPHERAL VASCULAR DISEASE (ICD-443.9) Pt with signs of progressive leg ischemia on exam with dependent rubor and absent pedal pulses. Her femoral pulses are also diminished. ABI's were 0.6 bilaterally with elevated velocities in the common and external iliac arteries. In this pt with prior AAA and graft placement, recommend a CTA to evaluate her graft and native vessel anatomy as there is now evidence of inflow disease. Will ask the patient to hold her diuretics and ARB prior to the CTA study because she has CKD. Further plans pending the study results.  Problem # 2:  RENAL ARTERY STENOSIS (ICD-440.1) Will evaluate renal arteries on CTA. Pt remains on plavix.  Other Orders: CT Scan  (CT Scan)  Patient Instructions: 1)  Non-Cardiac CT Angiography (CTA), is a special type of CT scan that uses a computer to produce multi-dimensional views of major blood vessels throughout the body. In CT angiography, a contrast material is injected through an IV to help visualize the blood vessels. 2)  DO NOT take Zaroxolyn or Cozaar the day before or the day of procedure.  You may restart these medication the day after procedure.  DO NOT take Furosemide the night before or morning of procedure.  You may restart Furosemide the evening of procedure.  3)  Your physician wants you to follow-up in:   6 MONTHS. You  will receive a reminder letter in the mail two months in advance. If you don't receive a letter, please call our office to schedule the follow-up appointment.

## 2010-05-01 NOTE — Letter (Signed)
Summary: CMN for Nebulizer/[facility]  CMN for Nebulizer/[facility]   Imported By: Lanelle Bal 01/03/2010 10:41:38  _____________________________________________________________________  External Attachment:    Type:   Image     Comment:   External Document

## 2010-05-01 NOTE — Progress Notes (Signed)
Summary: update on patient  Phone Note Other Incoming   Caller: Racheal w/ [facility]- [phone] Summary of Call: Rachael did home visit this morning. She says that yesterday her weight jumped from 165-170, both legs are swollen. Last night she was having a little bit of chest pain. Patient's oxygen was 95. Today patient's blood pressure is 182/70. Please advise.  Initial call taken by: Melody Comas,  January 24, 2010 1:06 PM  Follow-up for Phone Call        Complicated pt...limited in diuresis by creatinine somewhat..recent ARF.  Appears from last cards note that some of peripheral edema due to venous insufficiency. BP up from last OV. I don't believe she has had chest pain in past.  Have [facility] Nurse eval lungs to assure they are clear. I will forward this note to Dr. Patsy Lager who is more familiar to her care..but I would lean towards her coming back to office tommorow AM for reeval...to ER if severe chest pain. if he does not respond with opinion by end of day..please have her worked into appt tomorow.  Follow-up by: Kerby Nora MD,  January 24, 2010 1:49 PM  Additional Follow-up for Phone Call Additional follow up Details #1::        Left message with nurse to call and let us know about patients lungs and have patient worked in tomorrow on dr coplands schedule Additional Follow-up by: Benny Lennert CMA Duncan Dull),  January 24, 2010 1:53 PM    Additional Follow-up for Phone Call Additional follow up Details #2::    I agree that recheck tomorrow is most appropriate.  She is on my schedule already. Hannah Beat MD  January 24, 2010 3:47 PM

## 2010-05-01 NOTE — Assessment & Plan Note (Signed)
Summary: ROA FOR FOLLOW-UP/JRR   Vital Signs:  Patient profile:   77 year old female Height:      62 inches Weight:      182.8 pounds BMI:     33.56 Temp:     97.5 degrees F oral Pulse rate:   64 / minute Pulse rhythm:   regular BP sitting:   130 / 72  (left arm) Cuff size:   large  Vitals Entered By: Benny Lennert CMA Duncan Dull) (November 27, 2009 9:48 AM)  History of Present Illness: Chief complaint follow up  77 yo  Edema, swelling: doing better compared to when had worsening  cellulitis? improved on current abx, avelox and doxy  last OV: 11/20/2009 very well-known to me, recent hospitalization with a slow decrease in hemoglobin, slow GI bleed. She has an upcoming colonoscopy scheduled in one week with Dr. Juanda Chance.  Hemoglobin has been stable, and the patient feels much better.  CHF, increased edema, she had a relatively pronounced increase in edema with a 10 pound fluid gain compared to prior office visit seen him last Friday. At that time, Lasix dosing was increased and the patient is also on some Zaroxolyn, which was a new medication. She is diuresed 9 pounds since that time, she has no shortness of breath and her edema is much better.  Cellulitis: This is a presumed cellulitis in the left foot on the dorsum, it is mildly warm to touch and red, though it does appear to be somewhat better compared to prior evaluation. Patient is currently on doxycycline, and has multiple antibiotic allergies.    Allergies: 1)  ! Pcn 2)  ! Codeine 3)  ! Demerol 4)  ! Morphine 5)  ! Darvon 6)  ! Sulfa 7)  ! * Niaspan  Past History:  Past medical, surgical, family and social histories (including risk factors) reviewed, and no changes noted (except as noted below).  Past Medical History: Reviewed history from 10/30/2008 and no changes required. Diabetes mellitus, type II 8/05 Diverticulosis, colon inflamed hemorrhoids 03/13/06 Osteopenia 09/1999, Dexa 8/05 Osteopenia Peptic ulcer  disease Peripheral vascular disease 1. Diastolic heart failure 2. Coronary artery disease, status post prior coronary bypass graft     surgery with  patent grafts  2010 3. Renovascular hypertension. 4. Renal artery stenosis, status post recent renal artery stenting on     the left. 5. History of paroxysmal atrial fibrillation, not a Coumadin     candidate. 6. History of deep vein thrombophlebitis and pulmonary embolism,     status post IV filter placement. 7. History of gastrointestinal bleeding. 8. Diabetes. 9. Hypertension. 10. S/P AAA repair 10.Hyperlipidemia.        . .  Past Surgical History: Reviewed history from 11/22/2008 and no changes required. Appendectomy Coronary artery bypass graft 09/04/01 Cataract extraction Both eyes with IOL 2001 Cholecystectomy Hysterectomy (total) Carotid U/S unchanged 40-50% ICA stenosis--12/1998 Myoview [place]. Neg EF 76%--06/26/2004 Carotid doppler 12/05, unchanged 9/06 MRI Lumbar Spine Neg, Neurosurg 10/04 Cardiac cath 09/03/01 Stress cardiolite LVF 81%--8/01,  3/06 neg Renal U/S Unsatisfactory study 8/01 L Heart Cath 01/20/06 AAA Repair 1997 Myocardial perfusion EF 67% 10/07 EGD Abn. 12/07--esophageal stricture 1/09--06/30/07--gastritis and major bleed Colonoscopy Abn. 03/13/06 EMG--06/17/07--severd  DM neuropathy and radiculopathy---[facility]--06/27/2008--feet, abnormal MRI back--3/09--old compression fx L1, DDD--[facility] placement of inferior vena cava filter --07/03/07--[facility] blood transfusions x6 with GI bleed 3/27-07/03/07 Adenosin stress test 08/17/07---low risk stent to R kidney 8/09 renal artery doppler--11/17/2008--severe L common iliac stenosis  Family History: Reviewed  history from 08/23/2008 and no changes required.  Mother deceased from cancer.  Father deceased from a   CVA.  She had one brother who died from CVA and 3 brothers who died from   MI.   Social History: Reviewed history from 08/23/2008 and  no changes required. She lives in [place].  She is married.  She quit   smoking in 1992, but she has a former history of 40-pack years.   Negative for EtOH.  Negative for drug use.  She uses a walker in the   nursing home for ambulation.      Review of Systems       as above  Physical Exam  General:  Well-developed,well-nourished,in no acute distress; alert,appropriate and cooperative throughout examination Head:  Normocephalic and atraumatic without obvious abnormalities. No apparent alopecia or balding. Ears:  no external deformities.   Nose:  no external deformity.   Lungs:  Normal respiratory effort, chest expands symmetrically. Lungs are clear to auscultation, no crackles or wheezes. Heart:  Normal rate and regular rhythm. S1 and S2 normal without gallop, 2/6 sys murmur,no click, rub or other extra sounds.  NO JVD   Impression & Recommendations:  Problem # 1:  CHRONIC SYSTOLIC HEART FAILURE (ICD-428.22) Assessment Improved has f/u with Dr. Excell Seltzer -- I appreciate his insight. The only things I could think about may be change to coreg, could titrate up for effect on bblocker. assistance appreciated  Her updated medication list for this problem includes:    Furosemide 80 Mg Tabs (Furosemide) .Marland Kitchen... Take two in the morning and take 100mg  in the morning    Cozaar 50 Mg Tabs (Losartan potassium) .Marland Kitchen... 1 twice daily    Plavix 75 Mg Tabs (Clopidogrel bisulfate) .Marland Kitchen... 1 daily by mouth    Toprol Xl 25 Mg Xr24h-tab (Metoprolol succinate) .Marland Kitchen... 1 by mouth daily    Benazepril Hcl 20 Mg Tabs (Benazepril hcl) .Marland Kitchen... Take 1 tablet by mouth once a day    Zaroxolyn 5 Mg Tabs (Metolazone) .Marland Kitchen... 1 tab by mouth daily  Problem # 2:  CELLULITIS, LEGS (ICD-682.6) edema, redness i am not clear this was actually cellulitis, but regardless, it is improved.   Her updated medication list for this problem includes:    Doxycycline Hyclate 100 Mg Caps (Doxycycline hyclate) .Marland Kitchen... 1 tab by mouth two  times a day x 10 days    Avelox 400 Mg Tabs (Moxifloxacin hcl) .Marland Kitchen... 1 tablet by mouth daily  Problem # 3:  ANEMIA, NORMOCYTIC (ICD-285.9) colonoscopy tomorrow  Her updated medication list for this problem includes:    Ferrous Sulfate 325 (65 Fe) Mg Tbec (Ferrous sulfate) .Marland Kitchen... 1 tab by mouth two times a day  Complete Medication List: 1)  Furosemide 80 Mg Tabs (Furosemide) .... Take two in the morning and take 100mg  in the morning 2)  Cozaar 50 Mg Tabs (Losartan potassium) .Marland Kitchen.. 1 twice daily 3)  Vytorin 10-20 Mg Tabs (Ezetimibe-simvastatin) .... Take one tab by mouth once daily 4)  Plavix 75 Mg Tabs (Clopidogrel bisulfate) .Marland Kitchen.. 1 daily by mouth 5)  Sertraline Hcl 100 Mg Tabs (Sertraline hcl) .... Daily 6)  Norvasc 10 Mg Tabs (Amlodipine besylate) .... Take one tab by mouth once daily 7)  Toprol Xl 25 Mg Xr24h-tab (Metoprolol succinate) .Marland Kitchen.. 1 by mouth daily 8)  Midodrine Hcl 2.5 Mg Tabs (Midodrine hcl) .Marland Kitchen.. 1 by mouth three times a day 9)  Isosorbide Mononitrate Cr 30 Mg Xr24h-tab (Isosorbide mononitrate) .... Take 1 tablet by mouth  once a day 10)  Xalatan 0.005 % Soln (Latanoprost) .... One drop each eye daily 11)  Benazepril Hcl 20 Mg Tabs (Benazepril hcl) .... Take 1 tablet by mouth once a day 12)  Nitrostat 0.4 Mg Subl (Nitroglycerin) .Marland Kitchen.. 1 tablet under tongue at onset of chest pain; you may repeat every 5 minutes for up to 3 doses. 13)  Ferrous Sulfate 325 (65 Fe) Mg Tbec (Ferrous sulfate) .Marland Kitchen.. 1 tab by mouth two times a day 14)  Omeprazole 40 Mg Cpdr (Omeprazole) .... Take one by mouth two times a day 15)  Miralax Powd (Polyethylene glycol 3350) .... As per prep  instructions. 16)  Dulcolax 5 Mg Tbec (Bisacodyl) .... Day before procedure take 2 at 3pm and 2 at 8pm. 17)  Reglan 10 Mg Tabs (Metoclopramide hcl) .... As per prep instructions. 18)  Klor-con 20 Meq Pack (Potassium chloride) .Marland Kitchen.. 1 tab by mouth daily 19)  Zaroxolyn 5 Mg Tabs (Metolazone) .Marland Kitchen.. 1 tab by mouth daily 20)   Doxycycline Hyclate 100 Mg Caps (Doxycycline hyclate) .Marland Kitchen.. 1 tab by mouth two times a day x 10 days 21)  Avelox 400 Mg Tabs (Moxifloxacin hcl) .Marland Kitchen.. 1 tablet by mouth daily  Patient Instructions: 1)  f/u 3 months  Current Allergies (reviewed today): ! PCN ! CODEINE ! DEMEROL ! MORPHINE ! DARVON ! SULFA ! * NIASPAN  Appended Document: ROA FOR FOLLOW-UP/JRR Home Health documentation: Face to face encounter documentation follows: Patient needs home health services for the following reasons:  Home health services needed: 1. HHRN assessment 2. HHPT:  Patient homebound for the following reasons who: the patient is homebound, on oxygen. She cannot drive, she was recently hospitalized. She is now oxygen dependent, with congestive heart failure, exacerbation. Not clearly controlled. She needs assistance with activities of daily living, and basic care. Home health evaluation to help and assist with basic care, evaluate the home situation, and home health PT. The patient is not a candidate for PT outside of the home. She cannot drive, no one in her household to drive, and she is on oxygen and unstable.

## 2010-05-01 NOTE — Miscellaneous (Signed)
Summary: PT Discharge/[facility]  PT Discharge/[facility]   Imported By: Lanelle Bal 07/27/2009 13:40:39  _____________________________________________________________________  External Attachment:    Type:   Image     Comment:   External Document

## 2010-05-01 NOTE — Progress Notes (Signed)
  Walk in Patient Form Recieved " Patient left Prescriptions that need to be faxed in " forwarded to [facility]  April 03, 2009 2:23 PM

## 2010-05-01 NOTE — Assessment & Plan Note (Signed)
Summary: f16m   Visit Type:  Follow-up- 4 months Primary Provider:  Hannah Beat MD  CC:  The pt c/o chest .  History of Present Illness: The patient is 77 years old and return for management of CAD and CHF. She has had prior bypass surgery and had patent grafts in 2010. She has a long history of diastolic heart failure. She also has paroxysmal atrial fibrillation but is not felt to be a Coumadin candidate due to GI bleeding. She has a history of deep vein thrombophlebitis and pulmonary embolism and has an IVC filter in place. She's also had renal stents and had a recent negative duplex.  She says she's been doing fair. She doesn't feel she's had any fluid buildup but she does say she gets short of breath easily. She's had no chest pain. She had minimal postural dizziness.  Her other problems include hypertension, hyperlipidemia, and diabetes. She is also status post abdominal aortic aneurysm repair.  Current Medications (verified): 1)  Furosemide 80 Mg Tabs (Furosemide) .... Take One & A Half Tabs Every Morning and One Tab Every Evening. 2)  Neurontin 300 Mg  Caps (Gabapentin) .Marland Kitchen.. 1 Tablet By Mouth Three Times A Day 3)  Cozaar 50 Mg Tabs (Losartan Potassium) .Marland Kitchen.. 1 Twice Daily 4)  Vytorin 10-40 Mg Tabs (Ezetimibe-Simvastatin) .Marland Kitchen.. 1 Daily By Mouth 5)  Plavix 75 Mg Tabs (Clopidogrel Bisulfate) .Marland Kitchen.. 1 Daily By Mouth 6)  Vitamin B-12 500 Mcg Tabs (Cyanocobalamin) .Marland Kitchen.. 1 Daily By Mouth 7)  Aspirin 81 Mg Tbec (Aspirin) .... Take One Tablet By Mouth Daily 8)  Sertraline Hcl 100 Mg Tabs (Sertraline Hcl) .... Daily 9)  Norvasc 10 Mg Tabs (Amlodipine Besylate) .Marland Kitchen.. 1 Daily 10)  Toprol Xl 25 Mg Xr24h-Tab (Metoprolol Succinate) .Marland Kitchen.. 1 By Mouth Daily 11)  Midodrine Hcl 2.5 Mg Tabs (Midodrine Hcl) .Marland Kitchen.. 1 By Mouth Three Times A Day 12)  Isosorbide Mononitrate Cr 30 Mg Xr24h-Tab (Isosorbide Mononitrate) .... Take 1 Tablet By Mouth Once A Day 13)  Tylenol 325 Mg Tabs (Acetaminophen) .... Otc As  Directed. 14)  Xalatan 0.005 % Soln (Latanoprost) .... One Drop Each Eye Daily 15)  Iron 325 (65 Fe) Mg Tabs (Ferrous Sulfate) .... Take 1 Tablet By Mouth Once A Day 16)  Lotensin 10 Mg Tabs (Benazepril Hcl) .... Take 1 Tablet By Mouth Once A Day 17)  Folic Acid   Powd (Folic Acid) .... One Daily 18)  Flexeril 5 Mg Tabs (Cyclobenzaprine Hcl) .... One Tab By Mouth Three Times A Day  Allergies: 1)  ! Pcn 2)  ! Codeine 3)  ! Demerol 4)  ! Morphine 5)  ! Darvon 6)  ! Sulfa 7)  ! * Niaspan  Past History:  Past Medical History: Reviewed history from 10/30/2008 and no changes required. Diabetes mellitus, type II 8/05 Diverticulosis, colon inflamed hemorrhoids 03/13/06 Osteopenia 09/1999, Dexa 8/05 Osteopenia Peptic ulcer disease Peripheral vascular disease 1. Diastolic heart failure 2. Coronary artery disease, status post prior coronary bypass graft     surgery with  patent grafts  2010 3. Renovascular hypertension. 4. Renal artery stenosis, status post recent renal artery stenting on     the left. 5. History of paroxysmal atrial fibrillation, not a Coumadin     candidate. 6. History of deep vein thrombophlebitis and pulmonary embolism,     status post IV filter placement. 7. History of gastrointestinal bleeding. 8. Diabetes. 9. Hypertension. 10. S/P AAA repair 10.Hyperlipidemia.        Marland Kitchen Marland Kitchen  Review of Systems       ROS is negative except as outlined in HPI.   Vital Signs:  Patient profile:   77 year old female Height:      62 inches Weight:      200 pounds BMI:     36.71 Pulse rate:   57 / minute BP sitting:   160 / 53  (left arm) Cuff size:   large  Vitals Entered By: Burnett Kanaris, CNA (July 05, 2009 12:50 PM)  Physical Exam  Additional Exam:  Gen. Well-nourished, in no distress   Neck: JVP 3 cm above the clavicle, thyroid not enlarged, no carotid bruits Lungs: No tachypnea, clear without rales, rhonchi or wheezes Cardiovascular: Rhythm regular, PMI not  displaced,  heart sounds  normal, no murmurs or gallops, 1+ peripheral edema, pulses normal in all 4 extremities. Abdomen: BS normal, abdomen soft and non-tender without masses or organomegaly, no hepatosplenomegaly. MS: No deformities, no cyanosis or clubbing   Neuro:  No focal sns   Skin:  no lesions    Impression & Recommendations:  Problem # 1:  DIASTOLIC HEART FAILURE, CHRONIC (ICD-428.32) She has a long history of diastolic heart failure. She has JVD and slight peripheral edema and increased symptoms of shortness of breath. I think she has mild Lyme overload and we will increase her Lasix from 80 mg one half tablet in the morning and one tablet in the afternoon to 80 mg 2 tablets in the morning and one tablet in the afternoon. We will get a BMP in one week. Her updated medication list for this problem includes:    Furosemide 80 Mg Tabs (Furosemide) .Marland Kitchen... Take two tabs every morning and one tab every evening.    Cozaar 50 Mg Tabs (Losartan potassium) .Marland Kitchen... 1 twice daily    Plavix 75 Mg Tabs (Clopidogrel bisulfate) .Marland Kitchen... 1 daily by mouth    Aspirin 81 Mg Tbec (Aspirin) .Marland Kitchen... Take one tablet by mouth daily    Norvasc 10 Mg Tabs (Amlodipine besylate) .Marland Kitchen... 1 daily    Toprol Xl 25 Mg Xr24h-tab (Metoprolol succinate) .Marland Kitchen... 1 by mouth daily    Isosorbide Mononitrate Cr 30 Mg Xr24h-tab (Isosorbide mononitrate) .Marland Kitchen... Take 1 tablet by mouth once a day    Lotensin 20 Mg Tabs (Benazepril hcl) .Marland Kitchen... Take one tab by mouth once daily  Problem # 2:  CAD, AUTOLOGOUS BYPASS GRAFT (ICD-414.02) She has had previous bypass surgery. She's had no recent chest pain and this problem appears stable. Her updated medication list for this problem includes:    Plavix 75 Mg Tabs (Clopidogrel bisulfate) .Marland Kitchen... 1 daily by mouth    Aspirin 81 Mg Tbec (Aspirin) .Marland Kitchen... Take one tablet by mouth daily    Norvasc 10 Mg Tabs (Amlodipine besylate) .Marland Kitchen... 1 daily    Toprol Xl 25 Mg Xr24h-tab (Metoprolol succinate) .Marland Kitchen... 1 by  mouth daily    Isosorbide Mononitrate Cr 30 Mg Xr24h-tab (Isosorbide mononitrate) .Marland Kitchen... Take 1 tablet by mouth once a day    Lotensin 20 Mg Tabs (Benazepril hcl) .Marland Kitchen... Take one tab by mouth once daily  Problem # 3:  HYPERTENSION (ICD-401.9) She has had hypertension and has also had postural hypotension. Her blood pressure is somewhat elevated today. We will increase her Lotensin to 20 mg daily. Her updated medication list for this problem includes:    Furosemide 80 Mg Tabs (Furosemide) .Marland Kitchen... Take two tabs every morning and one tab every evening.    Cozaar 50 Mg Tabs (Losartan  potassium) .Marland Kitchen... 1 twice daily    Aspirin 81 Mg Tbec (Aspirin) .Marland Kitchen... Take one tablet by mouth daily    Norvasc 10 Mg Tabs (Amlodipine besylate) .Marland Kitchen... 1 daily    Toprol Xl 25 Mg Xr24h-tab (Metoprolol succinate) .Marland Kitchen... 1 by mouth daily    Lotensin 20 Mg Tabs (Benazepril hcl) .Marland Kitchen... Take one tab by mouth once daily  Problem # 4:  ATRIAL FIBRILLATION (ICD-427.31) She has a history of paroxysmal atrial fibrillation but is not a Coumadin candidate. Her updated medication list for this problem includes:    Plavix 75 Mg Tabs (Clopidogrel bisulfate) .Marland Kitchen... 1 daily by mouth    Aspirin 81 Mg Tbec (Aspirin) .Marland Kitchen... Take one tablet by mouth daily    Toprol Xl 25 Mg Xr24h-tab (Metoprolol succinate) .Marland Kitchen... 1 by mouth daily  Other Orders: EKG w/ Interpretation (93000)  Patient Instructions: 1)  Your physician recommends that you schedule a follow-up appointment in: 4 months. 2)  Your physician recommends that you have lab work in 1 week: bmet (428.33;402.10;402.10) 3)  Your physician has recommended you make the following change in your medication:  1) Increase lasix 80 mg to two tablets in the morning and one tablet in the evening. 2) Increase lotensin to 20mg  once daily. Prescriptions: LOTENSIN 20 MG TABS (BENAZEPRIL HCL) take one tab by mouth once daily  #90 x 3   Entered by:   Sherri Rad, RN, BSN   Authorized by:   Lenoria Farrier, MD, [facility]   Signed by:   Sherri Rad, RN, BSN on 07/05/2009   Method used:   Electronically to        [facility]* (mail-order)             ,          Ph: [phone]       Fax: [phone]   RxID:   [id number] MIDODRINE HCL 2.5 MG TABS (MIDODRINE HCL) 1 by mouth three times a day  #270 x 3   Entered by:   Sherri Rad, RN, BSN   Authorized by:   Lenoria Farrier, MD, [facility]   Signed by:   Sherri Rad, RN, BSN on 07/05/2009   Method used:   Electronically to        [facility]* (mail-order)             ,          Ph: [phone]       Fax: [phone]   RxID:   [id number] TOPROL XL 25 MG XR24H-TAB (METOPROLOL SUCCINATE) 1 by mouth daily  #90 x 3   Entered by:   Sherri Rad, RN, BSN   Authorized by:   Lenoria Farrier, MD, [facility]   Signed by:   Sherri Rad, RN, BSN on 07/05/2009   Method used:   Electronically to        [facility]* (mail-order)             ,          Ph: [phone]       Fax: [phone]   RxID:   [id number] COZAAR 50 MG TABS (LOSARTAN POTASSIUM) 1 TWICE DAILY  #180 x 3   Entered by:   Sherri Rad, RN, BSN   Authorized by:   Lenoria Farrier, MD, [facility]   Signed by:   Sherri Rad, RN, BSN on 07/05/2009   Method used:   Electronically to        [facility] (mail-order)             ,          Ph: [phone]       Fax: [phone]   RxID:   [id number]

## 2010-05-01 NOTE — Assessment & Plan Note (Signed)
Summary: wrist hurts after falling   Vital Signs:  Patient profile:   77 year old female Height:      62 inches Weight:      199 pounds BMI:     36.53 Temp:     99.4 degrees F oral Pulse rate:   84 / minute Pulse rhythm:   regular BP sitting:   152 / 48  (left arm) Cuff size:   large  Vitals Entered By: Delilah Shan CMA Duncan Dull) (April 21, 2009 2:45 PM) CC: Wrist hurts after falling   History of Present Illness: 74 here h/o for right wrist and hand swelling.  Was getting groceries out of car yesterday, tripped and fell on buttocks and outstretched right hand.  Has been very swollen, tender.  She can move her wrist and all fingers but it is very painful. Denies any numbness or tingling into her fingers. No weakness of hand or fingers. Taking Tylenol for pain.  Allergies: 1)  ! Pcn 2)  ! Codeine 3)  ! Demerol 4)  ! Morphine 5)  ! Darvon 6)  ! Sulfa 7)  ! * Niaspan  Review of Systems      See HPI MS:  Complains of joint pain and joint swelling; denies joint redness, loss of strength, and muscle weakness.  Physical Exam  General:  Well-developed,well-nourished,in no acute distress; alert,appropriate and cooperative throughout examination Msk:  Right hand- edematous, diffuse echymosis, tender to palpation throughout, unable to truly visulize anatomical snuff box due to edema.FROM.  Right wrist- echymosis laterally, tender to palpation, FROM. Psych:  good eye contact and subdued.     Impression & Recommendations:  Problem # 1:  HAND PAIN, RIGHT (ICD-729.5) Assessment New Cannot rule out scaphoid or other fractures.  Will refer to ortho immediately.   Orders: Orthopedic Referral (Ortho)  Complete Medication List: 1)  Furosemide 80 Mg Tabs (Furosemide) .... Take one & a half tabs every morning and one tab every evening. 2)  Neurontin 300 Mg Caps (Gabapentin) .Marland Kitchen.. 1 tablet by mouth three times a day 3)  Cozaar 50 Mg Tabs (Losartan potassium) .Marland Kitchen.. 1 twice daily 4)   Vytorin 10-40 Mg Tabs (Ezetimibe-simvastatin) .Marland Kitchen.. 1 daily by mouth 5)  Plavix 75 Mg Tabs (Clopidogrel bisulfate) .Marland Kitchen.. 1 daily by mouth 6)  Vitamin B-12 500 Mcg Tabs (Cyanocobalamin) .Marland Kitchen.. 1 daily by mouth 7)  Aspirin 81 Mg Tbec (Aspirin) .... Take one tablet by mouth daily 8)  Sertraline Hcl 100 Mg Tabs (Sertraline hcl) .... Daily 9)  Norvasc 10 Mg Tabs (Amlodipine besylate) .Marland Kitchen.. 1 daily 10)  Toprol Xl 25 Mg Xr24h-tab (Metoprolol succinate) .Marland Kitchen.. 1 by mouth daily 11)  Midodrine Hcl 2.5 Mg Tabs (Midodrine hcl) .Marland Kitchen.. 1 by mouth three times a day 12)  Isosorbide Mononitrate Cr 30 Mg Xr24h-tab (Isosorbide mononitrate) .... Take 1 tablet by mouth once a day 13)  Tylenol 325 Mg Tabs (Acetaminophen) .... Otc as directed. 14)  Xalatan 0.005 % Soln (Latanoprost) .... One drop each eye daily 15)  Iron 325 (65 Fe) Mg Tabs (Ferrous sulfate) .... Take 1 tablet by mouth once a day 16)  Lotensin 10 Mg Tabs (Benazepril hcl) .... Take 1 tablet by mouth once a day 17)  Folic Acid Powd (Folic acid) .... One daily 18)  Flexeril 5 Mg Tabs (Cyclobenzaprine hcl) .... One tab by mouth three times a day  Patient Instructions: 1)  Please stop by and see Shirlee Limerick on your way out.  Current Allergies (reviewed today): ! PCN !  CODEINE ! DEMEROL ! MORPHINE ! DARVON ! SULFA ! * NIASPAN

## 2010-05-01 NOTE — Assessment & Plan Note (Signed)
Summary: HOSPITAL FOLLOW-UP/ [place] [facility]   Vital Signs:  Patient profile:   77 year old female Height:      62 inches Weight:      165 pounds BMI:     30.29 Temp:     98.0 degrees F oral Pulse rate:   68 / minute Pulse rhythm:   regular BP sitting:   174 / 62  (right arm) Cuff size:   large  Vitals Entered By: Linde Gillis CMA Duncan Dull) (February 15, 2010 9:04 AM) CC: hospital follow up    History of Present Illness: 77 yo female with multiple medical problems here for hospital follow up.  Hospital records reviewed:  Hospitalized at [facility] 01/25/2010- 01/30/2010 for acute on chronic diastolic dyfunction. Patient was volume overloaded, verified by Xray findings. Was previously taking 120 mg Lasix, now on 180 mg lasix.   BNP at admission was 463, improved to 64 at discharge.  BP elevated today- I reviewed prior BPs and she often runs this high.  Was obviously much lower when she was acutely ill with GI bleed several months ago. Losartan was added to her medication, Benazapril dose increased.    Hypokalemic- normalized prior to discharge- 4.4.  Remains on Potassium 20 meq daily but on increased dose of lasix.  Overall, feels so much better.  Much more energy, less not swollen unless she has been on her feet for long periods of time.  No longer as short of breath.     Current Medications (verified): 1)  Furosemide 80 Mg Tabs (Furosemide) .Marland Kitchen.. 1 Tab Two Times A Day 2)  Vytorin 10-40 Mg Tabs (Ezetimibe-Simvastatin) .... Take 1/2 Tab  Once Daily 3)  Sertraline Hcl 100 Mg Tabs (Sertraline Hcl) .... Take 1 Tablet By Mouth Once A Day 4)  Norvasc 10 Mg Tabs (Amlodipine Besylate) .... Take One Tab By Mouth Once Daily 5)  Toprol Xl 50 Mg Xr24h-Tab (Metoprolol Succinate) .... Take One Tablet By Mouth Daily 6)  Isosorbide Mononitrate Cr 30 Mg Xr24h-Tab (Isosorbide Mononitrate) .... Take 1 Tablet By Mouth Once A Day 7)  Xalatan 0.005 % Soln (Latanoprost) .... One Drop Each Eye Daily 8)   Nitrostat 0.4 Mg Subl (Nitroglycerin) .Marland Kitchen.. 1 Tablet Under Tongue At Onset of Chest Pain; You May Repeat Every 5 Minutes For Up To 3 Doses. 9)  Pantoprazole Sodium 40 Mg Tbec (Pantoprazole Sodium) .Marland Kitchen.. 1 Tab Once Daily 10)  Klor-Con 10 10 Meq Cr-Tabs (Potassium Chloride) .... Take 1/2  Tablet Daily 11)  Ketorolac Ophthalimic Solution 0.4 % .... Rt Eye 4 Times A Day 12)  Oxycodone Hcl 5 Mg Tabs (Oxycodone Hcl) .Marland Kitchen.. 1 Tab As Needed For Pain 13)  Anusol Suppository .... At Bedtime As Needed 14)  Combivent 18-103 Mcg/act Aero (Ipratropium-Albuterol) .Marland Kitchen.. 1 Puff Bid 15)  Albuterol Nebulizer .... Twice A Day 16)  Ferrous Sulfate 325 (65 Fe) Mg Tabs (Ferrous Sulfate) .... Take One Tab By Mouth Two Times A Day 17)  Benazepril Hcl 20 Mg Tabs (Benazepril Hcl) .... Take One Tablet By Mouth Daily 18)  Mirtazapine 15 Mg Tabs (Mirtazapine) .Marland Kitchen.. 1 By Mouth At Bedtime 19)  Cozaar 25 Mg Tabs (Losartan Potassium) .... Take One Tablet By Mouth Two Times A Day  Allergies: 1)  ! Pcn 2)  ! Codeine 3)  ! Demerol 4)  ! Morphine 5)  ! Darvon 6)  ! Sulfa 7)  ! * Niaspan  Past History:  Past Medical History: Last updated: 01/04/2010 Diabetes mellitus, type II 8/05  COPD, on oxygen Diverticulosis, colon inflamed hemorrhoids 03/13/06 Osteopenia 09/1999, Dexa 8/05 Osteopenia Peptic ulcer disease Peripheral vascular disease 1. Diastolic heart failure 2. Coronary artery disease, status post prior coronary bypass graft     surgery with  patent grafts  2010 3. Renovascular hypertension. 4. Renal artery stenosis, status post recent renal artery stenting on     the left. 5. History of paroxysmal atrial fibrillation, not a Coumadin     candidate. 6. History of deep vein thrombophlebitis and pulmonary embolism,     status post IV filter placement. 7. History of gastrointestinal bleeding. 8. Diabetes. 9. Hypertension. 10. S/P AAA repair 10.Hyperlipidemia.  11. gastric ersions       . Marland Kitchen  Past Surgical  History: Last updated: 01/04/2010 11/2009: ARF, Cr to 7, Resp failure, COPD, intubated in ICU. Appendectomy Coronary artery bypass graft 09/04/01 Cataract extraction Both eyes with IOL 2001 Cholecystectomy Hysterectomy (total) Carotid U/S unchanged 40-50% ICA stenosis--12/1998 Myoview [place]. Neg EF 76%--06/26/2004 Carotid doppler 12/05, unchanged 9/06 Cardiac cath 09/03/01 Renal U/S Unsatisfactory study 8/01 AAA Repair 1997 EGD Abn. 12/07--esophageal stricture 1/09--06/30/07--gastritis and major bleed Colonoscopy Abn. 03/13/06 EMG--06/17/07--severd  DM neuropathy and radiculopathy---[facility]--06/27/2008--feet, abnormal placement of inferior vena cava filter --07/03/07--[facility] blood transfusions x6 with GI bleed 3/27-07/03/07 stent to R kidney 8/09 renal artery doppler--11/17/2008--severe L common iliac stenosis  Family History: Last updated: 11/27/2009  according last visit. Mother deceased from cancer.  Father deceased from a  CVA.  She had one brother who died from CVA and 3 brothers who died from  MI.   Social History: Last updated: 11/27/2009 She lives in [place].  She is married.  She quit  smoking in 1992, but she has a former history of 40-pack years.  Negative for EtOH.  Negative for drug use.  She uses a walker in the  nursing home for ambulation.      Risk Factors: Smoking Status: quit (06/25/2006)  Review of Systems      See HPI General:  Denies fever and malaise. CV:  Denies chest pain or discomfort. Resp:  Denies shortness of breath and wheezing.  Physical Exam  General:  Well-developed,well-nourished,in no acute distress; alert,appropriate and cooperative throughout examination, hypertensive. Head:  Normocephalic and atraumatic without obvious abnormalities. No apparent alopecia or balding. Eyes:  pupils equal, pupils round, and pupils reactive to light.   Ears:  no external deformities.   Nose:  O2 per [place] Mouth:  pharynx pink and moist.   Lungs:  Poor  air movement diffusely but no crackles and air movement is equal.  Air sounds are equal.   No wheezes Heart:  Normal rate and regular rhythm. S1 and S2 normal without gallop, 2/6 sys murmur,no click, rub or other extra sounds.  Extremities:  trace bilateral edema Neurologic:  alert & oriented X3 and sensation intact to light touch.   Skin:  Intact without suspicious lesions or rashes Psych:  Cognition and judgment appear intact. Alert and cooperative with normal attention span and concentration. No apparent delusions, illusions, hallucinations   Impression & Recommendations:  Problem # 1:  CHRONIC SYSTOLIC HEART FAILURE (ICD-428.22) Assessment Deteriorated s/p admission for decompensation. Volume status much improved. Has appt with cardiology on 11/29. Her updated medication list for this problem includes:    Furosemide 80 Mg Tabs (Furosemide) .Marland Kitchen... 1 tab two times a day    Toprol Xl 50 Mg Xr24h-tab (Metoprolol succinate) .Marland Kitchen... Take one tablet by mouth daily    Benazepril Hcl 20 Mg Tabs (Benazepril  hcl) ..... Take one tablet by mouth daily    Cozaar 25 Mg Tabs (Losartan potassium) .Marland Kitchen... Take one tablet by mouth two times a day  Problem # 2:  HYPOKALEMIA (ICD-276.8) Assessment: New Recheck BMET today.  Continue Potassium 20 meq  Problem # 3:  HYPERTENSION (ICD-401.9) Assessment: Deteriorated Remains elevated but BP meds actually increased during hospitalization and BP today correlates with prior readings. Asymptomatic.  Advised follow up with PCP after her cardiology visit at the end of the month. Her updated medication list for this problem includes:    Furosemide 80 Mg Tabs (Furosemide) .Marland Kitchen... 1 tab two times a day    Norvasc 10 Mg Tabs (Amlodipine besylate) .Marland Kitchen... Take one tab by mouth once daily    Toprol Xl 50 Mg Xr24h-tab (Metoprolol succinate) .Marland Kitchen... Take one tablet by mouth daily    Benazepril Hcl 20 Mg Tabs (Benazepril hcl) .Marland Kitchen... Take one tablet by mouth daily    Cozaar 25 Mg  Tabs (Losartan potassium) .Marland Kitchen... Take one tablet by mouth two times a day  Complete Medication List: 1)  Furosemide 80 Mg Tabs (Furosemide) .Marland Kitchen.. 1 tab two times a day 2)  Vytorin 10-40 Mg Tabs (Ezetimibe-simvastatin) .... Take 1/2 tab  once daily 3)  Sertraline Hcl 100 Mg Tabs (Sertraline hcl) .... Take 1 tablet by mouth once a day 4)  Norvasc 10 Mg Tabs (Amlodipine besylate) .... Take one tab by mouth once daily 5)  Toprol Xl 50 Mg Xr24h-tab (Metoprolol succinate) .... Take one tablet by mouth daily 6)  Isosorbide Mononitrate Cr 30 Mg Xr24h-tab (Isosorbide mononitrate) .... Take 1 tablet by mouth once a day 7)  Xalatan 0.005 % Soln (Latanoprost) .... One drop each eye daily 8)  Nitrostat 0.4 Mg Subl (Nitroglycerin) .Marland Kitchen.. 1 tablet under tongue at onset of chest pain; you may repeat every 5 minutes for up to 3 doses. 9)  Pantoprazole Sodium 40 Mg Tbec (Pantoprazole sodium) .Marland Kitchen.. 1 tab once daily 10)  Klor-con 10 10 Meq Cr-tabs (Potassium chloride) .... Take 1/2  tablet daily 11)  Ketorolac Ophthalimic Solution 0.4 %  .... Rt eye 4 times a day 12)  Oxycodone Hcl 5 Mg Tabs (Oxycodone hcl) .Marland Kitchen.. 1 tab as needed for pain 13)  Anusol Suppository  .... At bedtime as needed 14)  Combivent 18-103 Mcg/act Aero (Ipratropium-albuterol) .Marland Kitchen.. 1 puff bid 15)  Albuterol Nebulizer  .... Twice a day 16)  Ferrous Sulfate 325 (65 Fe) Mg Tabs (Ferrous sulfate) .... Take one tab by mouth two times a day 17)  Benazepril Hcl 20 Mg Tabs (Benazepril hcl) .... Take one tablet by mouth daily 18)  Mirtazapine 15 Mg Tabs (Mirtazapine) .Marland Kitchen.. 1 by mouth at bedtime 19)  Cozaar 25 Mg Tabs (Losartan potassium) .... Take one tablet by mouth two times a day  Other Orders: Venipuncture (16109) TLB-BMP (Basic Metabolic Panel-BMET) (80048-METABOL)   Orders Added: 1)  Venipuncture [36415] 2)  TLB-BMP (Basic Metabolic Panel-BMET) [80048-METABOL] 3)  Est. Patient Level IV [60454]    Current Allergies (reviewed today): ! PCN !  CODEINE ! DEMEROL ! MORPHINE ! DARVON ! SULFA ! * NIASPAN

## 2010-05-01 NOTE — Miscellaneous (Signed)
Summary: PT Care Plan/[facility]  PT Care Plan/[facility]   Imported By: Lanelle Bal 04/27/2009 10:26:48  _____________________________________________________________________  External Attachment:    Type:   Image     Comment:   External Document

## 2010-05-01 NOTE — Progress Notes (Signed)
Summary: pt needs refill  Phone Note Refill Request Message from:  Patient on [facility]  Refills Requested: Medication #1:  FUROSEMIDE 80 MG TABS 1 tab two times a day Initial call taken by: Omer Jack,  February 09, 2010 12:13 PM  Follow-up for Phone Call       Follow-up by: Judithe Modest CMA,  February 09, 2010 2:04 PM    Prescriptions: FUROSEMIDE 80 MG TABS (FUROSEMIDE) 1 tab two times a day  #60 x 3   Entered by:   Judithe Modest CMA   Authorized by:   Lenoria Farrier, MD, [facility]   Signed by:   Judithe Modest CMA on 02/09/2010   Method used:   Electronically to        [facility]* (retail)             ,          Ph: [phone]       Fax: [phone]   RxID:   [id number]

## 2010-05-01 NOTE — Miscellaneous (Signed)
Summary: SN Orders/[facility]  SN Orders/[facility]   Imported By: Lanelle Bal 11/30/2009 12:31:33  _____________________________________________________________________  External Attachment:    Type:   Image     Comment:   External Document

## 2010-05-01 NOTE — Miscellaneous (Signed)
Summary: update med  Clinical Lists Changes  Medications: Added new medication of NITROSTAT 0.4 MG SUBL (NITROGLYCERIN) 1 tablet under tongue at onset of chest pain; you may repeat every 5 minutes for up to 3 doses. 

## 2010-05-01 NOTE — Miscellaneous (Signed)
Summary: PT Care Plan/[facility]  PT Care Plan/[facility]   Imported By: Lanelle Bal 06/29/2009 12:37:16  _____________________________________________________________________  External Attachment:    Type:   Image     Comment:   External Document

## 2010-05-01 NOTE — Assessment & Plan Note (Signed)
Summary: f72m/PV   Visit Type:  6 months follow up Primary Provider:  Hannah Beat MD  CC:  Bilateral leg pain- Legs edema.  History of Present Illness: This is a 77 year old woman presenting for followup evaluation for peripheral vascular disease. The patient has multiple peripheral vascular problems, including abdominal aortic aneurysm status post surgical repair, bilateral lower extremity PAD with reduced ABIs, renal artery stenosis status post renal stenting, and history of DVT status post IVC filter placement.  She complains of left groin pain radiating down to the knee. Denies calf claudication symptoms or rest pain. No chest pain or dyspnea. No recent lightheadedness or syncope. Dr Juanda Chance increased her ACE-I last week and the pt is due for f/u labs today.      Current Medications (verified): 1)  Furosemide 80 Mg Tabs (Furosemide) .... Take Two Tabs Every Morning and One Tab Every Evening. 2)  Neurontin 300 Mg  Caps (Gabapentin) .Marland Kitchen.. 1 Tablet By Mouth Three Times A Day 3)  Cozaar 50 Mg Tabs (Losartan Potassium) .Marland Kitchen.. 1 Twice Daily 4)  Vytorin 10-40 Mg Tabs (Ezetimibe-Simvastatin) .Marland Kitchen.. 1 Daily By Mouth 5)  Plavix 75 Mg Tabs (Clopidogrel Bisulfate) .Marland Kitchen.. 1 Daily By Mouth 6)  Vitamin B-12 500 Mcg Tabs (Cyanocobalamin) .Marland Kitchen.. 1 Daily By Mouth 7)  Aspirin 81 Mg Tbec (Aspirin) .... Take One Tablet By Mouth Daily 8)  Sertraline Hcl 100 Mg Tabs (Sertraline Hcl) .... Daily 9)  Norvasc 10 Mg Tabs (Amlodipine Besylate) .Marland Kitchen.. 1 Daily 10)  Toprol Xl 25 Mg Xr24h-Tab (Metoprolol Succinate) .Marland Kitchen.. 1 By Mouth Daily 11)  Midodrine Hcl 2.5 Mg Tabs (Midodrine Hcl) .Marland Kitchen.. 1 By Mouth Three Times A Day 12)  Isosorbide Mononitrate Cr 30 Mg Xr24h-Tab (Isosorbide Mononitrate) .... Take 1 Tablet By Mouth Once A Day 13)  Tylenol 325 Mg Tabs (Acetaminophen) .... Otc As Directed. 14)  Xalatan 0.005 % Soln (Latanoprost) .... One Drop Each Eye Daily 15)  Iron 325 (65 Fe) Mg Tabs (Ferrous Sulfate) .... Take 1 Tablet  By Mouth Once A Day 16)  Folic Acid   Powd (Folic Acid) .... One Daily 17)  Flexeril 5 Mg Tabs (Cyclobenzaprine Hcl) .... One Tab By Mouth Three Times A Day 18)  Benazepril Hcl 20 Mg Tabs (Benazepril Hcl) .... Take 1 Tablet By Mouth Once A Day  Allergies: 1)  ! Pcn 2)  ! Codeine 3)  ! Demerol 4)  ! Morphine 5)  ! Darvon 6)  ! Sulfa 7)  ! * Niaspan  Past History:  Past medical history reviewed for relevance to current acute and chronic problems.  Past Medical History: Reviewed history from 10/30/2008 and no changes required. Diabetes mellitus, type II 8/05 Diverticulosis, colon inflamed hemorrhoids 03/13/06 Osteopenia 09/1999, Dexa 8/05 Osteopenia Peptic ulcer disease Peripheral vascular disease 1. Diastolic heart failure 2. Coronary artery disease, status post prior coronary bypass graft     surgery with  patent grafts  2010 3. Renovascular hypertension. 4. Renal artery stenosis, status post recent renal artery stenting on     the left. 5. History of paroxysmal atrial fibrillation, not a Coumadin     candidate. 6. History of deep vein thrombophlebitis and pulmonary embolism,     status post IV filter placement. 7. History of gastrointestinal bleeding. 8. Diabetes. 9. Hypertension. 10. S/P AAA repair 10.Hyperlipidemia.        . .  Review of Systems       Negative except as per HPI   Vital Signs:  Patient profile:  77 year old female Height:      62 inches Weight:      199.50 pounds BMI:     36.62 Pulse rate:   64 / minute Pulse rhythm:   regular Resp:     20 per minute BP sitting:   152 / 51  (left arm) Cuff size:   large  Vitals Entered By: Vikki Ports (July 11, 2009 10:39 AM)  Serial Vital Signs/Assessments:  Time      Position  BP       Pulse  Resp  Temp     By           R Arm     158/59                         Vikki Ports   Physical Exam  General:  Pt is alert and oriented, obese, elderly woman, in no acute distress. HEENT:  normal Neck: normal carotid upstrokes with bilateral bruits, JVP normal Lungs: CTA CV: RRR without murmur or gallop Abd: soft, NT, positive BS, no bruit, no organomegaly Ext: no clubbing, cyanosis, or edema. Femoral pulses 1+, pedal pulses nonpalpable. Skin: warm and dry without rash    Arterial Doppler  Procedure date:  05/18/2009  Findings:      Patent renal arteries bilaterally, right kidney 11.6 cm, left kidney 10.0 cm. Significant abdominal aortic and iliac stenosis present.  ABI's  Procedure date:  05/20/2008  Findings:      Right leg: 0.54 Left leg: 0.73    Impression & Recommendations:  Problem # 1:  RENAL ARTERY STENOSIS (ICD-440.1) Stable with renal duplex results reviewed from last year. Will repeat in August of this year. Recommend continue dual antiplatelet therapy with aspirin and Plavix.  Orders: Renal Artery Duplex (Renal Artery Duplex)  Problem # 2:  PERIPHERAL VASCULAR DISEASE (ICD-443.9) ABIs reviewed, with moderate disease on the right and mild disease on the left. The patient's left leg pain is atypical for vascular disease and I suspect it is related to a hip problem. The patient sees Dr. Patsy Lager later this month and will defer evaluation of this to him.  Will follow up with ABIs and lower extremity duplex scanning since she was noted to have significant inflow disease at the time of her last renal duplex and her ABIs were reduced significantly last year. She has no physical exam evidence of resting ischemia.  Problem # 3:  HYPERTENSION (ICD-401.9) Difficult problem with labile hypertension complicated by orthostatic hypotension. Recent med adjustment per Dr. Juanda Chance.    The following medications were removed from the medication list:    Lotensin 20 Mg Tabs (Benazepril hcl) .Marland Kitchen... Take one tab by mouth once daily Her updated medication list for this problem includes:    Furosemide 80 Mg Tabs (Furosemide) .Marland Kitchen... Take two tabs every morning and one tab  every evening.    Cozaar 50 Mg Tabs (Losartan potassium) .Marland Kitchen... 1 twice daily    Aspirin 81 Mg Tbec (Aspirin) .Marland Kitchen... Take one tablet by mouth daily    Norvasc 10 Mg Tabs (Amlodipine besylate) .Marland Kitchen... 1 daily    Toprol Xl 25 Mg Xr24h-tab (Metoprolol succinate) .Marland Kitchen... 1 by mouth daily    Benazepril Hcl 20 Mg Tabs (Benazepril hcl) .Marland Kitchen... Take 1 tablet by mouth once a day  Other Orders: Arterial Duplex Lower Extremity (Arterial Duplex Low) TLB-BMP (Basic Metabolic Panel-BMET) (80048-METABOL)  Patient Instructions: 1)  Your physician recommends that you have lab work  today: BMP (ordered by Dr Juanda Chance) 2)  Your physician recommends that you schedule a follow-up appointment in: September with Dr Excell Seltzer 3)  Your physician has requested that you have a lower extremity arterial duplex.  This test is an ultrasound of the arteries in the legs.  It looks at arterial blood flow in the legs.  Allow one hour for Lower  Arterial scans. There are no restrictions or special instructions. 4)  Your physician has requested that you have a renal artery duplex in August. During this test, an ultrasound is used to evaluate blood flow to the kidneys. Allow one hour for this exam. Do not eat after midnight the day before and avoid carbonated beverages. Take your medications as you usually do.

## 2010-05-01 NOTE — Assessment & Plan Note (Signed)
Summary: FOLLOW-UP AND CHECK SWELLING/JRR   Vital Signs:  Patient profile:   77 year old female Height:      62 inches Weight:      166.0 pounds BMI:     30.47 Temp:     97.7 degrees F oral Pulse rate:   72 / minute Pulse rhythm:   regular BP sitting:   120 / 70  (left arm) Cuff size:   large  Vitals Entered By: Benny Lennert CMA Duncan Dull) (March 01, 2010 9:22 AM)  History of Present Illness: Chief complaint follow up and check swelling  71 your old patient very well known to me, with a history of multiple oscillations recently. Recent hospitalization was CHF of his exacerbation, increase Lasix requirements, then stabilized. Reviewed Dr. Regino Schultze notes from yesterday.  COPD: She is on oxygen baseline, nor do i. There some financial limitations, so I have not been able to start her on Spiriva or Combivent. We are going to attempt to make a social work consult for attempt to get any assistance that we can.  Cardiac. CHF, atrial fibrillation. Status post CABG. Before this entirely to cardiology. She is going to follow with Dr. Excell Seltzer in the future. She also does have some severe peripheral vascular disease.  Anemia: Status post GI bleed, with iron deficiency anemia. The patient stopped her iron, and not entirely clear why.  creatinine is as stabilized, status post acute renal failure  She has no steps at home. She is using a wheel rolling walker, and is able to move around the house and do cleaning. She doesn't do any cooking, which her husband does.  Physical family support.  Review of systems: Continues to have some shortness of breath, limitation with walking, but can get around the house. She continues to have some edema. This is improved compared to prior examinations. Generally feels better compared to prior area she is eating and drinking without nausea, vomiting or diarrhea.    Current Problems (verified): 1)  COPD  (ICD-496) 2)  Renal Insufficiency  (ICD-588.9) 3)   Rectal Bleeding  (ICD-569.3) 4)  Chronic Systolic Heart Failure  (ICD-428.22) 5)  Encounter For Long-term Use of Other Medications  (ICD-V58.69) 6)  Migraine Without Aura  (ICD-346.10) 7)  Renal Artery Stenosis  (ICD-440.1) 8)  Atrial Fibrillation  (ICD-427.31) 9)  Cad, Autologous Bypass Graft  (ICD-414.02) 10)  Diastolic Heart Failure, Chronic  (ICD-428.32) 11)  Compression Fracture, L1 Vertebra  (ICD-805.4) 12)  Degenerative Disc Disease  (ICD-722.6) 13)  Polyneuropathy in Diabetes  (ICD-357.2) 14)  Depression  (ICD-311) 15)  Gout, Acute  (ICD-274.9) 16)  Diverticulitis  (ICD-562.11) 17)  Herpes Zoster  (ICD-053.9) 18)  Anemia, Iron Deficiency 10/07  (ICD-280.9) 19)  Carotid Artery Stenosis (MILD) Both 12/05  (ICD-433.10) 20)  Glaucoma (DR. GROAT)  (ICD-365.9) 21)  Pulmonary Embolism, Hx of  (ICD-V12.51) 22)  Peripheral Vascular Disease  (ICD-443.9) 23)  Peptic Ulcer Disease  (ICD-533.90) 24)  Osteopenia  (ICD-733.90) 25)  Hypertension  (ICD-401.9) 26)  Hyperlipidemia  (ICD-272.4) 27)  Dvt, Hx of  (ICD-V12.51) 28)  Diverticulosis, Colon  (ICD-562.10) 29)  Diabetes Mellitus, Type II  (ICD-250.00)  Allergies: 1)  ! Pcn 2)  ! Codeine 3)  ! Demerol 4)  ! Morphine 5)  ! Darvon 6)  ! Sulfa 7)  ! * Niaspan  Past History:  Past medical, surgical, family and social histories (including risk factors) reviewed, and no changes noted (except as noted below).  Past Medical History: Reviewed  history from 01/04/2010 and no changes required. Diabetes mellitus, type II 8/05 COPD, on oxygen Diverticulosis, colon inflamed hemorrhoids 03/13/06 Osteopenia 09/1999, Dexa 8/05 Osteopenia Peptic ulcer disease Peripheral vascular disease 1. Diastolic heart failure 2. Coronary artery disease, status post prior coronary bypass graft     surgery with  patent grafts  2010 3. Renovascular hypertension. 4. Renal artery stenosis, status post recent renal artery stenting on     the left. 5.  History of paroxysmal atrial fibrillation, not a Coumadin     candidate. 6. History of deep vein thrombophlebitis and pulmonary embolism,     status post IV filter placement. 7. History of gastrointestinal bleeding. 8. Diabetes. 9. Hypertension. 10. S/P AAA repair 10.Hyperlipidemia.  11. gastric ersions       . Marland Kitchen  Past Surgical History: Reviewed history from 01/04/2010 and no changes required. 11/2009: ARF, Cr to 7, Resp failure, COPD, intubated in ICU. Appendectomy Coronary artery bypass graft 09/04/01 Cataract extraction Both eyes with IOL 2001 Cholecystectomy Hysterectomy (total) Carotid U/S unchanged 40-50% ICA stenosis--12/1998 Myoview Adeno. Neg EF 76%--06/26/2004 Carotid doppler 12/05, unchanged 9/06 Cardiac cath 09/03/01 Renal U/S Unsatisfactory study 8/01 AAA Repair 1997 EGD Abn. 12/07--esophageal stricture 1/09--06/30/07--gastritis and major bleed Colonoscopy Abn. 03/13/06 EMG--06/17/07--severd  DM neuropathy and radiculopathy---[facility]--06/27/2008--feet, abnormal placement of inferior vena cava filter --07/03/07--[facility] blood transfusions x6 with GI bleed 3/27-07/03/07 stent to R kidney 8/09 renal artery doppler--11/17/2008--severe L common iliac stenosis  Family History: Reviewed history from 11/27/2009 and no changes required.  according last visit. Mother deceased from cancer.  Father deceased from a  CVA.  She had one brother who died from CVA and 3 brothers who died from  MI.   Social History: Reviewed history from 11/27/2009 and no changes required. She lives in [place].  She is married.  She quit  smoking in 1992, but she has a former history of 40-pack years.  Negative for EtOH.  Negative for drug use.  She uses a walker in the  nursing home for ambulation.       Impression & Recommendations:  Problem # 1:  RENAL INSUFFICIENCY (ICD-588.9) Assessment Improved I am pleased, the patient appears better compared to prior recent visits. She is stable,  walks with a walker.  CPD, not optimize, we will attempt to get a social work consult and attempt to get any sort of systems is enrollment in the care part D. or any sort of medication assistance. Would benefit from Spiriva. Checking on insurance coverage -- which should cover Spiriva?  Will follow-up with cardiac issues with Dr. Excell Seltzer - completely defer to Dr. Juanda Chance in this management.  Problem # 2:  COPD (ICD-496) Assessment: Unchanged  The following medications were removed from the medication list:    Flovent Hfa 44 Mcg/act Aero (Fluticasone propionate  hfa) .Marland Kitchen... 1 puff two times a day Her updated medication list for this problem includes:    Dulera 100-5 Mcg/act Aero (Mometasone furo-formoterol fum) .Marland Kitchen... 1 puff two times a day  Problem # 3:  ANEMIA, IRON DEFICIENCY 10/07 (ICD-280.9)  Her updated medication list for this problem includes:    Ferrous Sulfate 325 (65 Fe) Mg Tbec (Ferrous sulfate) .Marland Kitchen... 1 by mouth two times a day  Problem # 4:  CHRONIC SYSTOLIC HEART FAILURE (ICD-428.22)  Her updated medication list for this problem includes:    Furosemide 80 Mg Tabs (Furosemide) .Marland Kitchen... 1 tab two times a day    Benazepril Hcl 10 Mg Tabs (Benazepril hcl) .Marland Kitchen... Take 1  tablet by mouth once a day  Complete Medication List: 1)  Furosemide 80 Mg Tabs (Furosemide) .Marland Kitchen.. 1 tab two times a day 2)  Vytorin 10-20 Mg Tabs (Ezetimibe-simvastatin) .... Take one tablet by mouth daily at bedtime 3)  Norvasc 10 Mg Tabs (Amlodipine besylate) .... Take one tab by mouth once daily 4)  Isosorbide Mononitrate Cr 30 Mg Xr24h-tab (Isosorbide mononitrate) .... Take 1 tablet by mouth once a day 5)  Xalatan 0.005 % Soln (Latanoprost) .... One drop each eye daily 6)  Nitrostat 0.4 Mg Subl (Nitroglycerin) .Marland Kitchen.. 1 tablet under tongue at onset of chest pain; you may repeat every 5 minutes for up to 3 doses. 7)  Pantoprazole Sodium 40 Mg Tbec (Pantoprazole sodium) .Marland Kitchen.. 1 tab once daily 8)  Klor-con 10 10 Meq  Cr-tabs (Potassium chloride) .... Take 1/2  tablet daily 9)  Ketorolac Ophthalimic Solution 0.4 %  .... Rt eye 4 times a day 10)  Oxycodone Hcl 5 Mg Tabs (Oxycodone hcl) .Marland Kitchen.. 1 tab as needed for pain 11)  Benazepril Hcl 10 Mg Tabs (Benazepril hcl) .... Take 1 tablet by mouth once a day 12)  Mirtazapine 15 Mg Tabs (Mirtazapine) .Marland Kitchen.. 1 by mouth at bedtime 13)  Dulera 100-5 Mcg/act Aero (Mometasone furo-formoterol fum) .Marland Kitchen.. 1 puff two times a day 14)  Oxigen 2 Litters  15)  Ferrous Sulfate 325 (65 Fe) Mg Tbec (Ferrous sulfate) .Marland Kitchen.. 1 by mouth two times a day  Patient Instructions: 1)  recheck 6 weeks 2)  STOP THE FLOVENT, YOU DO NOT NEED TO REFILL 3)  TAKE YOUR IRON TWICE A DAY Prescriptions: FERROUS SULFATE 325 (65 FE) MG TBEC (FERROUS SULFATE) 1 by mouth two times a day  #180 x 3   Entered and Authorized by:   Hannah Beat MD   Signed by:   Hannah Beat MD on 03/01/2010   Method used:   Electronically to        [facility]* (retail)             ,          Ph: [phone]       Fax: [phone]   RxID:   [id number]    Orders Added: 1)  Est. Patient Level IV [36644]    Current Allergies (reviewed today): ! PCN ! CODEINE ! DEMEROL ! MORPHINE ! DARVON ! SULFA ! * NIASPAN

## 2010-05-01 NOTE — Progress Notes (Signed)
Summary: refill  Phone Note Outgoing Call   Call placed by: Sherri Rad, RN, BSN,  April 05, 2009 2:54 PM Summary of Call: I called the pt to make her aware that I had sent in a refill for her furosemide, but that Dr. Juanda Chance usually does not fill gabapentin. I made the pt aware I would try to contact [facility]'s office to see if they will fill this for her. I sent a flag to [facility] requesting her to fill this for the pt.  Initial call taken by: Sherri Rad, RN, BSN,  April 05, 2009 2:56 PM

## 2010-05-01 NOTE — Assessment & Plan Note (Signed)
Summary: SWOLLEN ANKLES/CLE   Vital Signs:  Patient profile:   77 year old female Height:      62 inches Weight:      181 pounds BMI:     33.22 Temp:     99.3 degrees F oral Pulse rate:   76 / minute Pulse rhythm:   regular BP sitting:   166 / 50  (left arm) Cuff size:   large  Vitals Entered By: [name] LPN (January 25, 2010 10:25 AM) CC: ankles swollen   History of Present Illness: 77 year old female:  Coming in acutely, HHN found significant edema, SOB. Currently on 2L home 02. Patient "feels weak and bad" "short of breath" "swollen"  16 pound weight gain since my last OV Swollen ankles Pulse ox 84% sitting on 2 L 02.  Taking Lasix 80 mg in AM, 40 mg in PM  Recent hosp, resp failure, pulm edema, ARF, recent GI bleed. Compliant with meds.    Prior OV: Chief complaint D/c hospital 12-18-2009  Complex hospitalization where the patient had resp failure due to pulmonary edema, ARF Cr to 7 acutely with no prior history of renal disease significantly, lower gi bleed felt to be secondary to hemorrhoids.  At this point, d/c 12/18/2009.   Weakness: feels weak, lost 20 pounds, not eating well. No appetite. Nausea with eating. No vomitting.  COPD: change from flovent to combo Has been short of breath very easily, no on oxygen, using combivent 3-4 times a day, also albuterol nebs and proair as needed. Doing reasonably well on this.  Anemia, post bleed, cbc had stabilized, no further bleeding  ARF, cr returned to 1 on last recheck a few weeks ago.  Allergies: 1)  ! Pcn 2)  ! Codeine 3)  ! Demerol 4)  ! Morphine 5)  ! Darvon 6)  ! Sulfa 7)  ! * Niaspan  Past History:  Past medical, surgical, family and social histories (including risk factors) reviewed, and no changes noted (except as noted below).  Past Medical History: Reviewed history from 01/04/2010 and no changes required. Diabetes mellitus, type II 8/05 COPD, on oxygen Diverticulosis, colon inflamed  hemorrhoids 03/13/06 Osteopenia 09/1999, Dexa 8/05 Osteopenia Peptic ulcer disease Peripheral vascular disease 1. Diastolic heart failure 2. Coronary artery disease, status post prior coronary bypass graft     surgery with  patent grafts  2010 3. Renovascular hypertension. 4. Renal artery stenosis, status post recent renal artery stenting on     the left. 5. History of paroxysmal atrial fibrillation, not a Coumadin     candidate. 6. History of deep vein thrombophlebitis and pulmonary embolism,     status post IV filter placement. 7. History of gastrointestinal bleeding. 8. Diabetes. 9. Hypertension. 10. S/P AAA repair 10.Hyperlipidemia.  11. gastric ersions       . Marland Kitchen  Past Surgical History: Reviewed history from 01/04/2010 and no changes required. 11/2009: ARF, Cr to 7, Resp failure, COPD, intubated in ICU. Appendectomy Coronary artery bypass graft 09/04/01 Cataract extraction Both eyes with IOL 2001 Cholecystectomy Hysterectomy (total) Carotid U/S unchanged 40-50% ICA stenosis--12/1998 Myoview Adeno. Neg EF 76%--06/26/2004 Carotid doppler 12/05, unchanged 9/06 Cardiac cath 09/03/01 Renal U/S Unsatisfactory study 8/01 AAA Repair 1997 EGD Abn. 12/07--esophageal stricture 1/09--06/30/07--gastritis and major bleed Colonoscopy Abn. 03/13/06 EMG--06/17/07--severd  DM neuropathy and radiculopathy---[facility]--06/27/2008--feet, abnormal placement of inferior vena cava filter --07/03/07--[facility] blood transfusions x6 with GI bleed 3/27-07/03/07 stent to R kidney 8/09 renal artery doppler--11/17/2008--severe L common iliac stenosis  Family  History: Reviewed history from 11/27/2009 and no changes required.  according last visit. Mother deceased from cancer.  Father deceased from a  CVA.  She had one brother who died from CVA and 3 brothers who died from  MI.   Social History: Reviewed history from 11/27/2009 and no changes required. She lives in [place].  She is married.  She  quit  smoking in 1992, but she has a former history of 40-pack years.  Negative for EtOH.  Negative for drug use.  She uses a walker in the  nursing home for ambulation.      Review of Systems      See HPI       Otherwise, the pertinent positives and negatives are listed above and in the HPI, otherwise a full review of systems has been reviewed and is negative unless noted positive.  General:  Complains of fatigue and weakness; denies chills and fever. CV:  Complains of difficulty breathing while lying down, shortness of breath with exertion, swelling of feet, and weight gain; denies chest pain or discomfort. Resp:  Complains of shortness of breath.  Physical Exam  General:  Well-developed,well-nourished,in no acute distress; alert,appropriate and cooperative throughout examination Head:  Normocephalic and atraumatic without obvious abnormalities. No apparent alopecia or balding. Eyes:  pupils equal, pupils round, and pupils reactive to light.   Ears:  no external deformities.   Nose:  no external deformity.   Mouth:  pharynx pink and moist.   Neck:  No deformities, masses, or tenderness noted. Lungs:  Poor air movement diffusely. Dullness in B lower lobes without wheezes. Heart:  Normal rate and regular rhythm. S1 and S2 normal without gallop, 2/6 sys murmur,no click, rub or other extra sounds.  Abdomen:  Bowel sounds positive,abdomen soft and non-tender without masses, organomegaly or hernias noted. Extremities:  2+ LE edema Neurologic:  alert & oriented X3 and sensation intact to light touch.   Skin:  Intact without suspicious lesions or rashes Cervical Nodes:  No lymphadenopathy noted Psych:  Cognition and judgment appear intact. Alert and cooperative with normal attention span and concentration. No apparent delusions, illusions, hallucinations   Impression & Recommendations:  Problem # 1:  PLEURAL EFFUSION, RIGHT (ICD-511.9) Chest X-ray, 2 Views, PA and Lateral Indication:  sob Findings: Right sided significant pleural effusion, evidence of pumonary edema  CHF exacerbation, pulse ox 84%, with pleural effusion that is significant. Discussed with hospitalist service and arranging for patient to go to the ER for evaluation.  Problem # 2:  CHRONIC SYSTOLIC HEART FAILURE (ICD-428.22)  Her updated medication list for this problem includes:    Furosemide 80 Mg Tabs (Furosemide) .Marland Kitchen... 1 tab two times a day    Toprol Xl 25 Mg Xr24h-tab (Metoprolol succinate) .Marland Kitchen... 1 by mouth daily    Benazepril Hcl 10 Mg Tabs (Benazepril hcl) .Marland Kitchen... 1 by mouth daily  Orders: T-2 View CXR (71020TC)  Problem # 3:  DYSPNEA (ICD-786.05)  Problem # 4:  COPD (ICD-496)  Her updated medication list for this problem includes:    Combivent 18-103 Mcg/act Aero (Ipratropium-albuterol) .Marland Kitchen... 1 puff bid    Dulera 200-5 Mcg/act Aero (Mometasone furo-formoterol fum) .Marland Kitchen... 2 puffs two times a day  Problem # 5:  RENAL INSUFFICIENCY (ICD-588.9)  Problem # 6:  CORONARY ATHEROSCLEROSIS NATIVE CORONARY ARTERY (ICD-414.01)  Her updated medication list for this problem includes:    Furosemide 80 Mg Tabs (Furosemide) .Marland Kitchen... 1 tab two times a day    Norvasc 10 Mg  Tabs (Amlodipine besylate) .Marland Kitchen... Take one tab by mouth once daily    Toprol Xl 25 Mg Xr24h-tab (Metoprolol succinate) .Marland Kitchen... 1 by mouth daily    Isosorbide Mononitrate Cr 30 Mg Xr24h-tab (Isosorbide mononitrate) .Marland Kitchen... Take 1 tablet by mouth once a day    Nitrostat 0.4 Mg Subl (Nitroglycerin) .Marland Kitchen... 1 tablet under tongue at onset of chest pain; you may repeat every 5 minutes for up to 3 doses.    Benazepril Hcl 10 Mg Tabs (Benazepril hcl) .Marland Kitchen... 1 by mouth daily  Problem # 7:  RENAL ARTERY STENOSIS (ICD-440.1)  Problem # 8:  HYPERTENSION (ICD-401.9)  Her updated medication list for this problem includes:    Furosemide 80 Mg Tabs (Furosemide) .Marland Kitchen... 1 tab two times a day    Norvasc 10 Mg Tabs (Amlodipine besylate) .Marland Kitchen... Take one tab by mouth  once daily    Toprol Xl 25 Mg Xr24h-tab (Metoprolol succinate) .Marland Kitchen... 1 by mouth daily    Benazepril Hcl 10 Mg Tabs (Benazepril hcl) .Marland Kitchen... 1 by mouth daily  Problem # 9:  HYPERLIPIDEMIA (ICD-272.4)  Her updated medication list for this problem includes:    Vytorin 10-40 Mg Tabs (Ezetimibe-simvastatin) .Marland Kitchen... Take 1/2 tab  once daily  Problem # 10:  DIABETES MELLITUS, TYPE II (ICD-250.00)  Her updated medication list for this problem includes:    Benazepril Hcl 10 Mg Tabs (Benazepril hcl) .Marland Kitchen... 1 by mouth daily  Complete Medication List: 1)  Furosemide 80 Mg Tabs (Furosemide) .Marland Kitchen.. 1 tab two times a day 2)  Vytorin 10-40 Mg Tabs (Ezetimibe-simvastatin) .... Take 1/2 tab  once daily 3)  Sertraline Hcl 100 Mg Tabs (Sertraline hcl) .... Take 1 tablet by mouth once a day 4)  Norvasc 10 Mg Tabs (Amlodipine besylate) .... Take one tab by mouth once daily 5)  Toprol Xl 25 Mg Xr24h-tab (Metoprolol succinate) .Marland Kitchen.. 1 by mouth daily 6)  Midodrine Hcl 2.5 Mg Tabs (Midodrine hcl) .Marland Kitchen.. 1 by mouth three times a day 7)  Isosorbide Mononitrate Cr 30 Mg Xr24h-tab (Isosorbide mononitrate) .... Take 1 tablet by mouth once a day 8)  Xalatan 0.005 % Soln (Latanoprost) .... One drop each eye daily 9)  Nitrostat 0.4 Mg Subl (Nitroglycerin) .Marland Kitchen.. 1 tablet under tongue at onset of chest pain; you may repeat every 5 minutes for up to 3 doses. 10)  Pantoprazole Sodium 40 Mg Tbec (Pantoprazole sodium) .Marland Kitchen.. 1 tab once daily 11)  Klor-con 10 10 Meq Cr-tabs (Potassium chloride) .... Take 1/2  tablet daily 12)  Ketorolac Ophthalimic Solution 0.4 %  .... Rt eye 4 times a day 13)  Oxycodone Hcl 5 Mg Tabs (Oxycodone hcl) .Marland Kitchen.. 1 tab as needed for pain 14)  Anusol Suppository  .... At bedtime as needed 15)  Combivent 18-103 Mcg/act Aero (Ipratropium-albuterol) .Marland Kitchen.. 1 puff bid 16)  Albuterol Nebulizer  .... Twice a day 17)  Ferrous Sulfate 325 (65 Fe) Mg Tabs (Ferrous sulfate) .... Take one tab by mouth two times a day 18)   Benazepril Hcl 10 Mg Tabs (Benazepril hcl) .Marland Kitchen.. 1 by mouth daily 19)  Dulera 200-5 Mcg/act Aero (Mometasone furo-formoterol fum) .... 2 puffs two times a day 20)  Mirtazapine 15 Mg Tabs (Mirtazapine) .Marland Kitchen.. 1 by mouth at bedtime   Orders Added: 1)  T-2 View CXR [71020TC] 2)  Est. Patient Level V [16109]    Current Allergies (reviewed today): ! PCN ! CODEINE ! DEMEROL ! MORPHINE ! DARVON ! SULFA ! * NIASPAN

## 2010-05-01 NOTE — Assessment & Plan Note (Signed)
Summary: 6 wk f/u dlo  9 am/dlo   Vital Signs:  Patient profile:   77 year old female Height:      62 inches Weight:      201.50 pounds BMI:     36.99 Temp:     98.0 degrees F oral Pulse rate:   56 / minute Pulse rhythm:   regular BP sitting:   160 / 42  (left arm) Cuff size:   large  Vitals Entered By: Linde Gillis CMA Duncan Dull) (May 24, 2009 8:47 AM) CC: 6 week follow up   History of Present Illness: 77 year old female:  f/u R adhesive capsulitis  Since then, has R arm (radius?) fx in short arm cast by Dr. Ophelia Charter and has a coccyx fracture.  Some improvement in shoulder pain and ROM  ROS: as above, no fever, chills.    Allergies: 1)  ! Pcn 2)  ! Codeine 3)  ! Demerol 4)  ! Morphine 5)  ! Darvon 6)  ! Sulfa 7)  ! * Niaspan  Physical Exam  General:  Well-developed,well-nourished,in no acute distress; alert,appropriate and cooperative throughout examination Head:  normocephalic, atraumatic, and no abnormalities observed.  Lungs:  normal respiratory effort.   Msk:  R wrist in short arm cast  R shoulder, abduction to 125 degrees loss of 30 degrees or EROM loss of 20 degreees of IROM   Impression & Recommendations:  Problem # 1:  ADHESIVE CAPSULITIS, RIGHT (ICD-726.0) improving, limited with current fx  restart aggressive PT in 1 month gentle ROM now  Complete Medication List: 1)  Furosemide 80 Mg Tabs (Furosemide) .... Take one & a half tabs every morning and one tab every evening. 2)  Neurontin 300 Mg Caps (Gabapentin) .Marland Kitchen.. 1 tablet by mouth three times a day 3)  Cozaar 50 Mg Tabs (Losartan potassium) .Marland Kitchen.. 1 twice daily 4)  Vytorin 10-40 Mg Tabs (Ezetimibe-simvastatin) .Marland Kitchen.. 1 daily by mouth 5)  Plavix 75 Mg Tabs (Clopidogrel bisulfate) .Marland Kitchen.. 1 daily by mouth 6)  Vitamin B-12 500 Mcg Tabs (Cyanocobalamin) .Marland Kitchen.. 1 daily by mouth 7)  Aspirin 81 Mg Tbec (Aspirin) .... Take one tablet by mouth daily 8)  Sertraline Hcl 100 Mg Tabs (Sertraline hcl) ....  Daily 9)  Norvasc 10 Mg Tabs (Amlodipine besylate) .Marland Kitchen.. 1 daily 10)  Toprol Xl 25 Mg Xr24h-tab (Metoprolol succinate) .Marland Kitchen.. 1 by mouth daily 11)  Midodrine Hcl 2.5 Mg Tabs (Midodrine hcl) .Marland Kitchen.. 1 by mouth three times a day 12)  Isosorbide Mononitrate Cr 30 Mg Xr24h-tab (Isosorbide mononitrate) .... Take 1 tablet by mouth once a day 13)  Tylenol 325 Mg Tabs (Acetaminophen) .... Otc as directed. 14)  Xalatan 0.005 % Soln (Latanoprost) .... One drop each eye daily 15)  Iron 325 (65 Fe) Mg Tabs (Ferrous sulfate) .... Take 1 tablet by mouth once a day 16)  Lotensin 10 Mg Tabs (Benazepril hcl) .... Take 1 tablet by mouth once a day 17)  Folic Acid Powd (Folic acid) .... One daily 18)  Flexeril 5 Mg Tabs (Cyclobenzaprine hcl) .... One tab by mouth three times a day  Patient Instructions: 1)  f/u 6-7 weeks 2)  In 4 weeks - go back to PT  Current Allergies (reviewed today): ! PCN ! CODEINE ! DEMEROL ! MORPHINE ! DARVON ! SULFA ! * NIASPAN

## 2010-05-01 NOTE — Letter (Signed)
Summary: New Patient letter  [facility]  [address] [place], [place] [address]   Phone: [phone]  Fax: [phone]       11/06/2009 MRN: [id number]  Donna Reese [address] [place], [place]  [address]  Dear Donna Reese,  Welcome to the Gastroenterology Division at [facility].    You are scheduled to see Dr.  Christella Hartigan on 12-18-09 at 2:30pm on the 3rd floor at [facility], [address]  We ask that you try to arrive at our office 15 minutes prior to your appointment time to allow for check-in.  We would like you to complete the enclosed self-administered evaluation form prior to your visit and bring it with you on the day of your appointment.  We will review it with you.  Also, please bring a complete list of all your medications or, if you prefer, bring the medication bottles and we will list them.  Please bring your insurance card so that we may make a copy of it.  If your insurance requires a referral to see a specialist, please bring your referral form from your primary care physician.  Co-payments are due at the time of your visit and may be paid by cash, check or credit card.     Your office visit will consist of a consult with your physician (includes a physical exam), any laboratory testing he/she may order, scheduling of any necessary diagnostic testing (e.g. x-ray, ultrasound, CT-scan), and scheduling of a procedure (e.g. Endoscopy, Colonoscopy) if required.  Please allow enough time on your schedule to allow for any/all of these possibilities.    If you cannot keep your appointment, please call [phone] to cancel or reschedule prior to your appointment date.  This allows [place] the opportunity to schedule an appointment for another patient in need of care.  If you do not cancel or reschedule by 5 p.m. the business day prior to your appointment date, you will be charged a $50.00 late cancellation/no-show fee.    Thank you for choosing  [facility] for your medical needs.  We appreciate the opportunity to care for you.  Please visit [place] at our website  to learn more about our practice.                     Sincerely,                                                             The Gastroenterology Division

## 2010-05-01 NOTE — Assessment & Plan Note (Signed)
Summary: 3 month   Visit Type:  Follow-up Primary Provider:  Hannah Beat MD  CC:  weakness.  History of Present Illness: Donna Reese is a 78 years returned for management of her see him in CHF after her recent hospitalization renal failure. She had remote bypass surgery and had patent grafts at catheterization in 2010. She has chronic diastolic heart failure. She also has paroxysmal atrial fibrillation and is now felt to be a Coumadin candidate. She also has peripheral vascular disease status post I am grafting.  She saw Dr. Excell Seltzer recently with leg pain and decreased lower extremity indices. A CT scan which showed stenoses at the distal anastomoses-bypass graft. The contrast and contrast and apathy with creatinines up to 7. She also had some rectal bleeding. Her creatinine improved and is down to one .last week.  She says he's had some swelling but no chest pain and no change in her shortness of breath. She has chronic obstructive pulmonary disease and is on home O2.  Current Medications (verified): 1)  Furosemide 80 Mg Tabs (Furosemide) .Marland Kitchen.. 1 Tab Two Times A Day 2)  Vytorin 10-40 Mg Tabs (Ezetimibe-Simvastatin) .... Take 1/2 Tab  Once Daily 3)  Sertraline Hcl 100 Mg Tabs (Sertraline Hcl) .... Take 1 Tablet By Mouth Once A Day 4)  Norvasc 10 Mg Tabs (Amlodipine Besylate) .... Take One Tab By Mouth Once Daily 5)  Toprol Xl 25 Mg Xr24h-Tab (Metoprolol Succinate) .Marland Kitchen.. 1 By Mouth Daily 6)  Midodrine Hcl 2.5 Mg Tabs (Midodrine Hcl) .Marland Kitchen.. 1 By Mouth Three Times A Day 7)  Isosorbide Mononitrate Cr 30 Mg Xr24h-Tab (Isosorbide Mononitrate) .... Take 1 Tablet By Mouth Once A Day 8)  Xalatan 0.005 % Soln (Latanoprost) .... One Drop Each Eye Daily 9)  Nitrostat 0.4 Mg Subl (Nitroglycerin) .Marland Kitchen.. 1 Tablet Under Tongue At Onset of Chest Pain; You May Repeat Every 5 Minutes For Up To 3 Doses. 10)  Pantoprazole Sodium 40 Mg Tbec (Pantoprazole Sodium) .Marland Kitchen.. 1 Tab Once Daily 11)  Klor-Con 20 Meq Pack  (Potassium Chloride) .Marland Kitchen.. 1 Tab By Mouth Daily 12)  Nu Iron 150mg  .... Take 1 Tab Two Times A Day 13)  Ketorolac Ophthalimic Solution 0.4 % .... Rt Eye 4 Times A Day 14)  Zofran 4 Mg Tabs (Ondansetron Hcl) .Marland Kitchen.. 1 Tab Two Times A Day 15)  Oxycodone Hcl 5 Mg Tabs (Oxycodone Hcl) .Marland Kitchen.. 1 Tab As Needed For Pain 16)  Anusol Suppository .... Qhs 17)  Flovent Hfa 44 Mcg/act Aero (Fluticasone Propionate  Hfa) .Marland Kitchen.. 1 Puff Two Times A Day 18)  Combivent 18-103 Mcg/act Aero (Ipratropium-Albuterol) .Marland Kitchen.. 1 Puff Bid 19)  Albuterol Nebulizer .... Twice A Day 20)  Ferrous Sulfate .Marland Kitchen.. 1 Tab Two Times A Day  Allergies (verified): 1)  ! Pcn 2)  ! Codeine 3)  ! Demerol 4)  ! Morphine 5)  ! Darvon 6)  ! Sulfa 7)  ! * Niaspan  Past History:  Past Medical History: Reviewed history from 11/16/2009 and no changes required. Diabetes mellitus, type II 8/05 Diverticulosis, colon inflamed hemorrhoids 03/13/06 Osteopenia 09/1999, Dexa 8/05 Osteopenia Peptic ulcer disease Peripheral vascular disease 1. Diastolic heart failure 2. Coronary artery disease, status post prior coronary bypass graft     surgery with  patent grafts  2010 3. Renovascular hypertension. 4. Renal artery stenosis, status post recent renal artery stenting on     the left. 5. History of paroxysmal atrial fibrillation, not a Coumadin     candidate. 6. History of  deep vein thrombophlebitis and pulmonary embolism,     status post IV filter placement. 7. History of gastrointestinal bleeding. 8. Diabetes. 9. Hypertension. 10. S/P AAA repair 10.Hyperlipidemia.  11. gastric ersions       . Marland Kitchen  Review of Systems       ROS is negative except as outlined in HPI.   Vital Signs:  Patient profile:   77 year old female Height:      62 inches Weight:      171 pounds BMI:     31.39 Pulse rate:   58 / minute BP sitting:   134 / 47  (left arm) Cuff size:   large  Vitals Entered By: Burnett Kanaris, CNA (December 27, 2009 3:04  PM)  Physical Exam  Additional Exam:  Gen. Well-nourished, in no distress   Neck: No JVD, thyroid not enlarged, no carotid bruits Lungs: No tachypnea, clear without rales, rhonchi or wheezes Cardiovascular: Rhythm regular, PMI not displaced,  heart sounds  normal, no murmurs or gallops, 1+ peripheral edema, pulses normal in all 4 extremities. Abdomen: BS normal, abdomen soft and non-tender without masses or organomegaly, no hepatosplenomegaly. MS: No deformities, no cyanosis or clubbing   Neuro:  No focal sns   Skin:  no lesions    Impression & Recommendations:  Problem # 1:  RENAL INSUFFICIENCY (ICD-588.9) She was recently hospitalized with contrast-induced nephropathy with renal failure with creatinines up to 7.0 .Marland Kitchen This has now resolved with her last creatinine = 1.  Problem # 2:  CORONARY ATHEROSCLEROSIS NATIVE CORONARY ARTERY (ICD-414.01) She had previous bypass surgery and has patent grafts. She's had no chest pain his problems were stable. The following medications were removed from the medication list:    Plavix 75 Mg Tabs (Clopidogrel bisulfate) .Marland Kitchen... 1 daily by mouth    Benazepril Hcl 20 Mg Tabs (Benazepril hcl) .Marland Kitchen... Take 1 tablet by mouth once a day Her updated medication list for this problem includes:    Norvasc 10 Mg Tabs (Amlodipine besylate) .Marland Kitchen... Take one tab by mouth once daily    Toprol Xl 25 Mg Xr24h-tab (Metoprolol succinate) .Marland Kitchen... 1 by mouth daily    Isosorbide Mononitrate Cr 30 Mg Xr24h-tab (Isosorbide mononitrate) .Marland Kitchen... Take 1 tablet by mouth once a day    Nitrostat 0.4 Mg Subl (Nitroglycerin) .Marland Kitchen... 1 tablet under tongue at onset of chest pain; you may repeat every 5 minutes for up to 3 doses.  The following medications were removed from the medication list:    Plavix 75 Mg Tabs (Clopidogrel bisulfate) .Marland Kitchen... 1 daily by mouth    Benazepril Hcl 20 Mg Tabs (Benazepril hcl) .Marland Kitchen... Take 1 tablet by mouth once a day Her updated medication list for this problem  includes:    Norvasc 10 Mg Tabs (Amlodipine besylate) .Marland Kitchen... Take one tab by mouth once daily    Toprol Xl 25 Mg Xr24h-tab (Metoprolol succinate) .Marland Kitchen... 1 by mouth daily    Isosorbide Mononitrate Cr 30 Mg Xr24h-tab (Isosorbide mononitrate) .Marland Kitchen... Take 1 tablet by mouth once a day    Nitrostat 0.4 Mg Subl (Nitroglycerin) .Marland Kitchen... 1 tablet under tongue at onset of chest pain; you may repeat every 5 minutes for up to 3 doses.  Problem # 3:  PERIPHERAL VASCULAR DISEASE (ICD-443.9) She is status post ABFBG and has stenoses at her distal anastomoses. The stenoses do not appear critical. I discussed the situation Dr. Excell Seltzer in with him conservative management the best option at this point.  Problem # 4:  CHRONIC SYSTOLIC HEART FAILURE (ICD-428.22) she has chronic diastolic heart failure. She appears euvolemic today except for pedal edema which is probably related to venous insufficiency. Appear stable. The following medications were removed from the medication list:    Cozaar 50 Mg Tabs (Losartan potassium) .Marland Kitchen... 1 twice daily    Plavix 75 Mg Tabs (Clopidogrel bisulfate) .Marland Kitchen... 1 daily by mouth    Benazepril Hcl 20 Mg Tabs (Benazepril hcl) .Marland Kitchen... Take 1 tablet by mouth once a day    Zaroxolyn 5 Mg Tabs (Metolazone) .Marland Kitchen... 1 tab by mouth daily Her updated medication list for this problem includes:    Furosemide 80 Mg Tabs (Furosemide) .Marland Kitchen... 1 tab two times a day    Norvasc 10 Mg Tabs (Amlodipine besylate) .Marland Kitchen... Take one tab by mouth once daily    Toprol Xl 25 Mg Xr24h-tab (Metoprolol succinate) .Marland Kitchen... 1 by mouth daily    Isosorbide Mononitrate Cr 30 Mg Xr24h-tab (Isosorbide mononitrate) .Marland Kitchen... Take 1 tablet by mouth once a day    Nitrostat 0.4 Mg Subl (Nitroglycerin) .Marland Kitchen... 1 tablet under tongue at onset of chest pain; you may repeat every 5 minutes for up to 3 doses.  Patient Instructions: 1)  Your physician recommends that you schedule a follow-up appointment in: 2 months. 2)  Finish your current supplies of  Nu-iron and Ferrous Sulfate, after the Nu-iron is gone, you do not need to refill it. We will leave you on the ferrous sulfate only. Prescriptions: FERROUS SULFATE 325 (65 FE) MG TABS (FERROUS SULFATE) take one tab by mouth two times a day  #180 x 3   Entered by:   Sherri Rad, RN, BSN   Authorized by:   Lenoria Farrier, MD, [facility]   Signed by:   Sherri Rad, RN, BSN on 12/27/2009   Method used:   Electronically to        [facility]* (retail)             ,          Ph: [phone]       Fax: [phone]   RxID:   [id number]

## 2010-05-01 NOTE — Assessment & Plan Note (Signed)
Summary: k   History of Present Illness Visit Type: Initial Visit Primary GI MD: Lina Sar MD Primary Provider: Hannah Beat MD Chief Complaint: Patient here for post hospital, she states that she is still seeing some BRB in her stool and she denies any trouble with her bowels otherwise. She also has anemia.  History of Present Illness:   Patient is a 77 year old female with multiple medical problems on multiple medications who was recently hospitalized with anemia and coffee ground emesis on Plavix and ASA. Ferritin level was 9.0. EGD by Dr. Leone Payor November 07, 2009 revealed multiple gastric erosions.  Patient sent home on PPI, iron and recommendations to have outpatient colonoscopy off Plavix. Hgb yesterday was 11.0. BM normal though she sees a small amount of bright red blood when she wipes.   Complains of lower extremiy edema. Saw PCP yesterday, Lasix increased. She is on oxygen 24 hours a day.    GI Review of Systems      Denies abdominal pain, acid reflux, belching, bloating, chest pain, dysphagia with liquids, dysphagia with solids, heartburn, loss of appetite, nausea, vomiting, vomiting blood, weight loss, and  weight gain.      Reports rectal bleeding.     Denies anal fissure, black tarry stools, change in bowel habit, constipation, diarrhea, diverticulosis, fecal incontinence, heme positive stool, hemorrhoids, irritable bowel syndrome, jaundice, light color stool, liver problems, and  rectal pain.    Current Medications (verified): 1)  Furosemide 80 Mg Tabs (Furosemide) .... Take Two in The Morning and Take 100mg  in The Morning 2)  Cozaar 50 Mg Tabs (Losartan Potassium) .Marland Kitchen.. 1 Twice Daily 3)  Vytorin 10-20 Mg Tabs (Ezetimibe-Simvastatin) .... Take One Tab By Mouth Once Daily 4)  Plavix 75 Mg Tabs (Clopidogrel Bisulfate) .Marland Kitchen.. 1 Daily By Mouth 5)  Sertraline Hcl 100 Mg Tabs (Sertraline Hcl) .... Daily 6)  Norvasc 10 Mg Tabs (Amlodipine Besylate) .... Take One Tab By Mouth Once  Daily 7)  Toprol Xl 25 Mg Xr24h-Tab (Metoprolol Succinate) .Marland Kitchen.. 1 By Mouth Daily 8)  Midodrine Hcl 2.5 Mg Tabs (Midodrine Hcl) .Marland Kitchen.. 1 By Mouth Three Times A Day 9)  Isosorbide Mononitrate Cr 30 Mg Xr24h-Tab (Isosorbide Mononitrate) .... Take 1 Tablet By Mouth Once A Day 10)  Xalatan 0.005 % Soln (Latanoprost) .... One Drop Each Eye Daily 11)  Benazepril Hcl 20 Mg Tabs (Benazepril Hcl) .... Take 1 Tablet By Mouth Once A Day 12)  Nitrostat 0.4 Mg Subl (Nitroglycerin) .Marland Kitchen.. 1 Tablet Under Tongue At Onset of Chest Pain; You May Repeat Every 5 Minutes For Up To 3 Doses. 13)  Ferrous Sulfate 325 (65 Fe) Mg Tbec (Ferrous Sulfate) .Marland Kitchen.. 1 Tab By Mouth Two Times A Day 14)  Omeprazole 40 Mg Cpdr (Omeprazole) .... Take One By Mouth Two Times A Day  Allergies (verified): 1)  ! Pcn 2)  ! Codeine 3)  ! Demerol 4)  ! Morphine 5)  ! Darvon 6)  ! Sulfa 7)  ! * Niaspan  Past History:  Past Medical History: Diabetes mellitus, type II 8/05 Diverticulosis, colon inflamed hemorrhoids 03/13/06 Osteopenia 09/1999, Dexa 8/05 Osteopenia Peptic ulcer disease Peripheral vascular disease 1. Diastolic heart failure 2. Coronary artery disease, status post prior coronary bypass graft     surgery with  patent grafts  2010 3. Renovascular hypertension. 4. Renal artery stenosis, status post recent renal artery stenting on     the left. 5. History of paroxysmal atrial fibrillation, not a Coumadin  candidate. 6. History of deep vein thrombophlebitis and pulmonary embolism,     status post IV filter placement. 7. History of gastrointestinal bleeding. 8. Diabetes. 9. Hypertension. 10. S/P AAA repair 10.Hyperlipidemia.  11. gastric ersions       . Marland Kitchen  Past Surgical History: Reviewed history from 11/22/2008 and no changes required. Appendectomy Coronary artery bypass graft 09/04/01 Cataract extraction Both eyes with IOL 2001 Cholecystectomy Hysterectomy (total) Carotid U/S unchanged 40-50% ICA  stenosis--12/1998 Myoview [place]. Neg EF 76%--06/26/2004 Carotid doppler 12/05, unchanged 9/06 MRI Lumbar Spine Neg, Neurosurg 10/04 Cardiac cath 09/03/01 Stress cardiolite LVF 81%--8/01,  3/06 neg Renal U/S Unsatisfactory study 8/01 L Heart Cath 01/20/06 AAA Repair 1997 Myocardial perfusion EF 67% 10/07 EGD Abn. 12/07--esophageal stricture 1/09--06/30/07--gastritis and major bleed Colonoscopy Abn. 03/13/06 EMG--06/17/07--severd  DM neuropathy and radiculopathy---[facility]--06/27/2008--feet, abnormal MRI back--3/09--old compression fx L1, DDD--[facility] placement of inferior vena cava filter --07/03/07--[place] blood transfusions x6 with GI bleed 3/27-07/03/07 Adenosin stress test 08/17/07---low risk stent to R kidney 8/09 renal artery doppler--11/17/2008--severe L common iliac stenosis  Family History:  Mother deceased from cancer ovarian.  Father deceased from a   CVA.  She had one brother who died from CVA and 3 brothers who died from  MI.  No FH of Colon Cancer:  Social History: Reviewed history from 08/23/2008 and no changes required. She lives in [place].  She is married.  She quit   smoking in 1992, but she has a former history of 40-pack years.   Negative for EtOH.  Negative for drug use.  She uses a walker in the  nursing home for ambulation.      Review of Systems       The patient complains of allergy/sinus, anemia, arthritis/joint pain, back pain, fatigue, headaches-new, heart rhythm changes, muscle pains/cramps, shortness of breath, sleeping problems, swelling of feet/legs, and urine leakage.  The patient denies anxiety-new, blood in urine, breast changes/lumps, change in vision, confusion, cough, coughing up blood, depression-new, fainting, fever, hearing problems, heart murmur, itching, menstrual pain, night sweats, nosebleeds, pregnancy symptoms, skin rash, sore throat, swollen lymph glands, thirst - excessive, urination - excessive, urination changes/pain,  vision changes, and voice change.    Vital Signs:  Patient profile:   77 year old female Height:      62 inches Weight:      194.0 pounds BMI:     35.61 Pulse rate:   76 / minute Pulse rhythm:   regular BP sitting:   176 / 40  (left arm) Cuff size:   large  Vitals Entered By: Harlow Mares CMA Duncan Dull) (November 16, 2009 1:42 PM)  Physical Exam  General:  Well developed, well nourished, no acute distress. Head:  Normocephalic and atraumatic. Eyes:  Conjunctiva pink, no icterus.  Mouth:  No oral lesions. Tongue moist.  Neck:  no obvious masses  Lungs:  Clear throughout to auscultation. Heart:  RRR Abdomen:  Limited exam, patient unable to get on examination table. Soft, obese, normal bowel sounds. Neurologic:  Alert and  oriented x4;  grossly normal neurologically. Skin:  Intact without significant lesions or rashes. Cervical Nodes:  No significant cervical adenopathy. Psych:  Alert and cooperative. Normal mood and affect.   Impression & Recommendations:  Problem # 1:  ANEMIA, IRON DEFICIENCY 10/07 (ICD-280.9) Assessment Improved Last colonoscopy Dec. 2007 with findings of internal hemorrhoids and diverticulosis. Recent EGD for anemia and hematemesis revealed multiple gastric erosions. Hgb stable now at 11.0. For further evaluation of anemia patient will  be scheduled for a colonoscopy with biopsies/polypectomy (if indicated).  The risks and benefits of the procedure, as well as alternatives were discussed with the patient and she agrees to proceed. Patient's Plavix will be held (per cardiology) for the procedure.   Orders: ZCOL (ZCOL)  Problem # 2:  CHRONIC SYSTOLIC HEART FAILURE (ICD-428.22) Assessment: Comment Only  Problem # 3:  ANTICOAGULATION THERAPY (ICD-V58.61) Assessment: Comment Only On chronic Plavix.  Patient Instructions: 1)  We have scheduled the Colonoscopy with Dr Juanda Chance on 11-28-09 at [facility]. 2)  Directions and brochure provided. 3)   [facility] Patient Information Guide given to patient. 4)  We sent the prescription for the Colonoscopy prep to [facility]. 5)  Copy sent to : Kerin Perna, MD 6)  The medication list was reviewed and reconciled.  All changed / newly prescribed medications were explained.  A complete medication list was provided to the patient / caregiver. Prescriptions: REGLAN 10 MG  TABS (METOCLOPRAMIDE HCL) As per prep instructions.  #2 x 0   Entered by:   Lowry Ram NCMA   Authorized by:   Willette Cluster NP   Signed by:   Lowry Ram NCMA on 11/16/2009   Method used:   Electronically to        [facility] [id number] (retail)       [address]       [place], [place]  [address]       Ph: [phone] or [phone]       Fax: [phone]   RxID:   [id number] DULCOLAX 5 MG  TBEC (BISACODYL) Day before procedure take 2 at 3pm and 2 at 8pm.  #4 x 0   Entered by:   Lowry Ram NCMA   Authorized by:   Willette Cluster NP   Signed by:   Lowry Ram NCMA on 11/16/2009   Method used:   [facility]ically to        [facility] (retail)       [address]       [place], [place]  [address]       Ph: [phone] or [phone]       Fax: [phone]   RxID:   [id number] MIRALAX   POWD (POLYETHYLENE GLYCOL 3350) As per prep  instructions.  #255gm x 0   Entered by:   Lowry Ram NCMA   Authorized by:   Willette Cluster NP   Signed by:   Lowry Ram NCMA on 11/16/2009   Method used:   [facility]        [facility] (retail)       [address]       [place], [place]  [address]       Ph: [phone] or [phone]       Fax: [phone]   RxID:   [id number]

## 2010-05-01 NOTE — Assessment & Plan Note (Signed)
Summary: FOLLOW UP  RT SHOULDER PER BILLIE/RI   Vital Signs:  Patient profile:   77 year old female Height:      62 inches Weight:      198.2 pounds BMI:     36.38 Temp:     98.0 degrees F oral Pulse rate:   72 / minute Pulse rhythm:   regular BP sitting:   150 / 70  (left arm) Cuff size:   large  Vitals Entered By: Benny Lennert CMA Duncan Dull) (April 13, 2009 10:52 AM)  History of Present Illness: Chief complaint shoulder pain  77 year old female:  pleasant lady who has been having right shoulder pain ongoing now for several months. She has lateral shoulder pain, does radiate some down the lateral aspect of her shoulder. Also occasionally will go down into her elbow and into her hand. She has no traumatic injury. No history of dislocation. On recent x-rays there was minimal to no osteoarthritic changes in the clinic and we'll joint.  She is having some pain limitation of motion throughout her shoulder and use.  at this point, she is tried minimal interventions.  No history of operative intervention in the affected shoulder. She does baseline have some degenerative disc disease and some neck pain, however this is not consistent with radiculopathy currently.   Allergies: 1)  ! Pcn 2)  ! Codeine 3)  ! Demerol 4)  ! Morphine 5)  ! Darvon 6)  ! Sulfa 7)  ! * Niaspan  Past History:  Past medical, surgical, family and social histories (including risk factors) reviewed, and no changes noted (except as noted below).  Past Medical History: Reviewed history from 10/30/2008 and no changes required. Diabetes mellitus, type II 8/05 Diverticulosis, colon inflamed hemorrhoids 03/13/06 Osteopenia 09/1999, Dexa 8/05 Osteopenia Peptic ulcer disease Peripheral vascular disease 1. Diastolic heart failure 2. Coronary artery disease, status post prior coronary bypass graft     surgery with  patent grafts  2010 3. Renovascular hypertension. 4. Renal artery stenosis, status post recent  renal artery stenting on     the left. 5. History of paroxysmal atrial fibrillation, not a Coumadin     candidate. 6. History of deep vein thrombophlebitis and pulmonary embolism,     status post IV filter placement. 7. History of gastrointestinal bleeding. 8. Diabetes. 9. Hypertension. 10. S/P AAA repair 10.Hyperlipidemia.        . .  Past Surgical History: Reviewed history from 11/22/2008 and no changes required. Appendectomy Coronary artery bypass graft 09/04/01 Cataract extraction Both eyes with IOL 2001 Cholecystectomy Hysterectomy (total) Carotid U/S unchanged 40-50% ICA stenosis--12/1998 Myoview Adeno. Neg EF 76%--06/26/2004 Carotid doppler 12/05, unchanged 9/06 MRI Lumbar Spine Neg, Neurosurg 10/04 Cardiac cath 09/03/01 Stress cardiolite LVF 81%--8/01,  3/06 neg Renal U/S Unsatisfactory study 8/01 L Heart Cath 01/20/06 AAA Repair 1997 Myocardial perfusion EF 67% 10/07 EGD Abn. 12/07--esophageal stricture 1/09--06/30/07--gastritis and major bleed Colonoscopy Abn. 03/13/06 EMG--06/17/07--severd  DM neuropathy and radiculopathy---[facility]--06/27/2008--feet, abnormal MRI back--3/09--old compression fx L1, DDD--[facility] placement of inferior vena cava filter --07/03/07--[facility] blood transfusions x6 with GI bleed 3/27-07/03/07 Adenosin stress test 08/17/07---low risk stent to R kidney 8/09 renal artery doppler--11/17/2008--severe L common iliac stenosis  Family History: Reviewed history from 08/23/2008 and no changes required.  Mother deceased from cancer.  Father deceased from a   CVA.  She had one brother who died from CVA and 3 brothers who died from   MI.   Social History: Reviewed history from 08/23/2008 and  no changes required. She lives in [place].  She is married.  She quit   smoking in 1992, but she has a former history of 40-pack years.   Negative for EtOH.  Negative for drug use.  She uses a walker in the   nursing home for ambulation.        Review of Systems       REVIEW OF SYSTEMS  GEN: No systemic complaints, no fevers, chills, sweats, or other acute illnesses MSK: Detailed in the HPI GI: tolerating PO intake without difficulty Neuro: No numbness, parasthesias, or tingling associated. Otherwise the pertinent positives of the ROS are noted above.    Physical Exam  General:  GEN: Well-developed,well-nourished,in no acute distress; alert,appropriate and cooperative throughout examination HEENT: Normocephalic and atraumatic without obvious abnormalities. No apparent alopecia or balding. Ears, externally no deformities PULM: Breathing comfortably in no respiratory distress EXT: No clubbing, cyanosis, or edema PSYCH: Normally interactive. Cooperative during the interview. Pleasant. Friendly and conversant. Not anxious or depressed appearing. Normal, full affect.  Msk:  right shoulder: Abduction to approximately 90. Flexion to 150. External rotation, loss of 20. Internal rotation to 90.  Nontender along clavicle and at the acromioclavicular joint. All special testing of the rotator cuff is equivocal.  Rotator cuff is intact and strength testing is grossly 5/5.   Impression & Recommendations:  Problem # 1:  ADHESIVE CAPSULITIS, RIGHT (ICD-726.0) Assessment New frozen shoulder. Loss of motion in all directions.  Nonoperative management works in the vast majority of these cases. In this case, more likely secondary.  Pain control with shoulder injection and physical therapy for range of motion home exercise program.  SubAC Injection, Right Verbal consent was obtained from the patient. Risks, benefits, and alternatives were explained. Patient prepped with Betadine and Ethyl Chloride used for anesthesia. The subacromial space was injected using the posterior approach. The patient tolerated the procedure well and had decreased pain post injection. No complications. Injection: 9 cc of Marcaine 0.5% and 1cc of Kenalog 40  mg. Needle: 22 gauge   Orders: Physical Therapy Referral (PT) Joint Aspirate / Injection, Large (20610) Kenalog 10mg  (4units) (J3301)  Complete Medication List: 1)  Furosemide 80 Mg Tabs (Furosemide) .... Take one & a half tabs every morning and one tab every evening. 2)  Neurontin 300 Mg Caps (Gabapentin) .Marland Kitchen.. 1 tablet by mouth three times a day 3)  Cozaar 50 Mg Tabs (Losartan potassium) .Marland Kitchen.. 1 twice daily 4)  Vytorin 10-40 Mg Tabs (Ezetimibe-simvastatin) .Marland Kitchen.. 1 daily by mouth 5)  Plavix 75 Mg Tabs (Clopidogrel bisulfate) .Marland Kitchen.. 1 daily by mouth 6)  Vitamin B-12 500 Mcg Tabs (Cyanocobalamin) .Marland Kitchen.. 1 daily by mouth 7)  Aspirin 81 Mg Tbec (Aspirin) .... Take one tablet by mouth daily 8)  Sertraline Hcl 100 Mg Tabs (Sertraline hcl) .... Daily 9)  Norvasc 10 Mg Tabs (Amlodipine besylate) .Marland Kitchen.. 1 daily 10)  Toprol Xl 25 Mg Xr24h-tab (Metoprolol succinate) .Marland Kitchen.. 1 by mouth daily 11)  Midodrine Hcl 2.5 Mg Tabs (Midodrine hcl) .Marland Kitchen.. 1 by mouth three times a day 12)  Isosorbide Mononitrate Cr 30 Mg Xr24h-tab (Isosorbide mononitrate) .... Take 1 tablet by mouth once a day 13)  Tylenol 325 Mg Tabs (Acetaminophen) .... Otc as directed. 14)  Xalatan 0.005 % Soln (Latanoprost) .... One drop each eye daily 15)  Iron 325 (65 Fe) Mg Tabs (Ferrous sulfate) .... Take 1 tablet by mouth once a day 16)  Lotensin 10 Mg Tabs (Benazepril hcl) .... Take 1 tablet  by mouth once a day 17)  Folic Acid Powd (Folic acid) .... One daily 18)  Flexeril 5 Mg Tabs (Cyclobenzaprine hcl) .... One tab by mouth three times a day  Patient Instructions: 1)  Referral Appointment Information 2)  Day/Date: 3)  Time: 4)  Place/MD: 5)  Address: 6)  Phone/Fax: 7)  Patient given appointment information. Information/Orders faxed/mailed.  8)  f/u 6 weeks  Current Allergies (reviewed today): ! PCN ! CODEINE ! DEMEROL ! MORPHINE ! DARVON ! SULFA ! * NIASPAN

## 2010-05-01 NOTE — Assessment & Plan Note (Signed)
Summary: Donna Reese AND CHEST FULL/JRR   Vital Signs:  Patient profile:   77 year old female Height:      62 inches Weight:      203 pounds BMI:     37.26 O2 Sat:      90 % on Room air Temp:     97.6 degrees F oral Pulse rate:   96 / minute Pulse rhythm:   regular BP sitting:   126 / 60  (left arm) Cuff size:   large  Vitals Entered By: Delilah Shan CMA Duncan Dull) (Aug 04, 2009 11:28 AM)  O2 Flow:  Room air CC: Congested, coughing, ST   History of Present Illness: 77 yo with complicated medical history, including diastolic CHF with 1 week of sinus congestion, productive cough, and wheezing. No worsening shortness of breath, feels like she is out of breath at baseline. Back is hurting from coughing so much. Feels feverish but has been afebrile. Drinking and eating normally.  Current Medications (verified): 1)  Furosemide 80 Mg Tabs (Furosemide) .... Take Two Tabs Every Morning and One Tab Every Evening. 2)  Neurontin 300 Mg  Caps (Gabapentin) .Marland Kitchen.. 1 Tablet By Mouth Three Times A Day 3)  Cozaar 50 Mg Tabs (Losartan Potassium) .Marland Kitchen.. 1 Twice Daily 4)  Vytorin 10-40 Mg Tabs (Ezetimibe-Simvastatin) .Marland Kitchen.. 1 Daily By Mouth 5)  Plavix 75 Mg Tabs (Clopidogrel Bisulfate) .Marland Kitchen.. 1 Daily By Mouth 6)  Vitamin B-12 500 Mcg Tabs (Cyanocobalamin) .Marland Kitchen.. 1 Daily By Mouth 7)  Aspirin 81 Mg Tbec (Aspirin) .... Take One Tablet By Mouth Daily 8)  Sertraline Hcl 100 Mg Tabs (Sertraline Hcl) .... Daily 9)  Norvasc 10 Mg Tabs (Amlodipine Besylate) .Marland Kitchen.. 1 Daily 10)  Toprol Xl 25 Mg Xr24h-Tab (Metoprolol Succinate) .Marland Kitchen.. 1 By Mouth Daily 11)  Midodrine Hcl 2.5 Mg Tabs (Midodrine Hcl) .Marland Kitchen.. 1 By Mouth Three Times A Day 12)  Isosorbide Mononitrate Cr 30 Mg Xr24h-Tab (Isosorbide Mononitrate) .... Take 1 Tablet By Mouth Once A Day 13)  Tylenol 325 Mg Tabs (Acetaminophen) .... Otc As Directed. 14)  Xalatan 0.005 % Soln (Latanoprost) .... One Drop Each Eye Daily 15)  Iron 325 (65 Fe) Mg Tabs  (Ferrous Sulfate) .... Take 1 Tablet By Mouth Once A Day 16)  Folic Acid   Powd (Folic Acid) .... One Daily 17)  Flexeril 5 Mg Tabs (Cyclobenzaprine Hcl) .... One Tab By Mouth Three Times A Day 18)  Benazepril Hcl 20 Mg Tabs (Benazepril Hcl) .... Take 1 Tablet By Mouth Once A Day 19)  Azithromycin 250 Mg  Tabs (Azithromycin) .... 2 By  Mouth Today and Then 1 Daily For 4 Days 20)  Tessalon Perles 100 Mg  Caps (Benzonatate) .Marland Kitchen.. 1 Tab By Mouth Three Times A Day As Needed Cough  Allergies: 1)  ! Pcn 2)  ! Codeine 3)  ! Demerol 4)  ! Morphine 5)  ! Darvon 6)  ! Sulfa 7)  ! * Niaspan  Review of Systems      See HPI General:  Complains of chills and fever. CV:  Denies chest pain or discomfort. Resp:  Complains of cough, sputum productive, and wheezing; denies shortness of breath.  Physical Exam  General:  Well-developed,well-nourished,in no acute distress; alert,appropriate and cooperative throughout examination, non toxic appearing but coughing Ears:  no external deformities.   Nose:  mucosal erythema and mucosal edema.   Mouth:  pharynx pink and moist, no erythema, and no exudates.   Lungs:  normal respiratory effort.  no crackles and no wheezes.   TTP over inferior rib cage bilaterally Heart:  normal rate. systolic quiet murmur heard throughout precardium  Extremities:  trace left pedal edema and trace right pedal edema.   Psych:  good eye contact and subdued.     Impression & Recommendations:  Problem # 1:  ACUTE BRONCHITIS (ICD-466.0) Assessment New Given complicated medical history, concern for devlopping pneumonia is high. Will treat with Zpack for now.  Lung exam good, although Pulse ox is 90% on room air (cannot find baseline in office notes). Tessalon Perles as needed cough. Advised husband to take to ER over weekend if symptoms worsen. Her updated medication list for this problem includes:    Azithromycin 250 Mg Tabs (Azithromycin) .Marland Kitchen... 2 by  mouth today and then 1  daily for 4 days    Tessalon Perles 100 Mg Caps (Benzonatate) .Marland Kitchen... 1 tab by mouth three times a day as needed cough  Orders: Prescription Created Electronically [phone])  Complete Medication List: 1)  Furosemide 80 Mg Tabs (Furosemide) .... Take two tabs every morning and one tab every evening. 2)  Neurontin 300 Mg Caps (Gabapentin) .Marland Kitchen.. 1 tablet by mouth three times a day 3)  Cozaar 50 Mg Tabs (Losartan potassium) .Marland Kitchen.. 1 twice daily 4)  Vytorin 10-40 Mg Tabs (Ezetimibe-simvastatin) .Marland Kitchen.. 1 daily by mouth 5)  Plavix 75 Mg Tabs (Clopidogrel bisulfate) .Marland Kitchen.. 1 daily by mouth 6)  Vitamin B-12 500 Mcg Tabs (Cyanocobalamin) .Marland Kitchen.. 1 daily by mouth 7)  Aspirin 81 Mg Tbec (Aspirin) .... Take one tablet by mouth daily 8)  Sertraline Hcl 100 Mg Tabs (Sertraline hcl) .... Daily 9)  Norvasc 10 Mg Tabs (Amlodipine besylate) .Marland Kitchen.. 1 daily 10)  Toprol Xl 25 Mg Xr24h-tab (Metoprolol succinate) .Marland Kitchen.. 1 by mouth daily 11)  Midodrine Hcl 2.5 Mg Tabs (Midodrine hcl) .Marland Kitchen.. 1 by mouth three times a day 12)  Isosorbide Mononitrate Cr 30 Mg Xr24h-tab (Isosorbide mononitrate) .... Take 1 tablet by mouth once a day 13)  Tylenol 325 Mg Tabs (Acetaminophen) .... Otc as directed. 14)  Xalatan 0.005 % Soln (Latanoprost) .... One drop each eye daily 15)  Iron 325 (65 Fe) Mg Tabs (Ferrous sulfate) .... Take 1 tablet by mouth once a day 16)  Folic Acid Powd (Folic acid) .... One daily 17)  Flexeril 5 Mg Tabs (Cyclobenzaprine hcl) .... One tab by mouth three times a day 18)  Benazepril Hcl 20 Mg Tabs (Benazepril hcl) .... Take 1 tablet by mouth once a day 19)  Azithromycin 250 Mg Tabs (Azithromycin) .... 2 by  mouth today and then 1 daily for 4 days 20)  Tessalon Perles 100 Mg Caps (Benzonatate) .Marland Kitchen.. 1 tab by mouth three times a day as needed cough Prescriptions: TESSALON PERLES 100 MG  CAPS (BENZONATATE) 1 tab by mouth three times a day as needed cough  #30 x 0   Entered and Authorized by:   Ruthe Mannan MD   Signed by:    Ruthe Mannan MD on 08/04/2009   Method used:   Electronically to        [facility]  [address] (retail)       [address]       [place], [place]  [address]       Ph: [phone] or [phone]       Fax: [phone]   RxID:   [id number] AZITHROMYCIN 250 MG  TABS (AZITHROMYCIN) 2 by  mouth today and then 1 daily for 4 days  #6  x 0   Entered and Authorized by:   Ruthe Mannan MD   Signed by:   Ruthe Mannan MD on 08/04/2009   Method used:   [facility] to        [facility] (retail)       [address]       [place], [place]  [address]       Ph: [phone] or [phone]       Fax: [phone]   RxID:   [id number]   Current Allergies (reviewed today): ! PCN ! CODEINE ! DEMEROL ! MORPHINE ! DARVON ! SULFA ! * NIASPAN

## 2010-05-01 NOTE — Miscellaneous (Signed)
Summary: SN Orders/[facility]  SN Orders/[facility]   Imported By: Lanelle Bal 12/29/2009 09:13:03  _____________________________________________________________________  External Attachment:    Type:   Image     Comment:   External Document

## 2010-05-01 NOTE — Procedures (Signed)
Summary: Colonoscopy  Patient: Donna Reese Note: All result statuses are Final unless otherwise noted.  Tests: (1) Colonoscopy (COL)   COL Colonoscopy           DONE     [facility]     [address] [place], [place]  [address]           COLONOSCOPY PROCEDURE REPORT           PATIENT:  Donna Reese, Donna Reese  MR#:  [id number]     BIRTHDATE:  1931-09-23, 78 yrs. old  GENDER:  female     ENDOSCOPIST:  Hedwig Morton. Juanda Chance, MD     REF. BY:  Karleen Hampshire T. Copland, M.D.     PROCEDURE DATE:  11/28/2009     PROCEDURE:  Colonoscopy [id number]     ASA CLASS:  Class II     INDICATIONS:  Iron deficiency anemia EGD 10/2009 erosive     gastritis,pt was on Plavix and ASA, Hgb up from 9.0 to 11.0     LAST COLON 2007     MEDICATIONS:   Versed 3 mg, Fentanyl 50 mcg           DESCRIPTION OF PROCEDURE:   After the risks benefits and     alternatives of the procedure were thoroughly explained, informed     consent was obtained.  Digital rectal exam was performed and     revealed no rectal masses.   The  endoscope was introduced through     the anus and advanced to the terminal ileum which was intubated     for a short distance, without limitations.  The quality of the     prep was excellent, using MiraLax.  The instrument was then slowly     withdrawn as the colon was fully examined.     <<PROCEDUREIMAGES>>           FINDINGS:  Severe diverticulosis was found in the sigmoid colon     (see image10 and image9). narrow spastic sigmoid colon with     hypertrophied folds  A diminutive polyp was found in the cecum.     The polyp was removed using cold biopsy forceps (see image2). tiny     2 mm cecal poly  The terminal ileum appeared normal. With standard     forceps, biopsy was obtained and sent to pathology (see image5,     image6, image7, and image8).  Internal hemorrhoids were found (see     image12 and image11).  This was otherwise a normal examination of     the colon (see image3, image4, and image2).    Retroflexed views in     the rectum revealed no abnormalities.    The scope was then     withdrawn from the patient and the procedure completed.           COMPLICATIONS:  None     ENDOSCOPIC IMPRESSION:     1) Severe diverticulosis in the sigmoid colon     2) Diminutive polyp in the cecum     3) Normal terminal ileum     4) Internal hemorrhoids     5) Otherwise normal examination     nothing to account for the severe iron deficiency anemia     RECOMMENDATIONS:     1) Await biopsy results     continue Iron supplements     repeat H/H in 6 weeks     REPEAT EXAM:  In  10 year(s) for.           ______________________________     Hedwig Morton. Juanda Chance, MD           CC:           n.     eSIGNED:   Hedwig Morton. Brecken Dewoody at 11/28/2009 11:47 AM           Andrey Spearman, [id number]  Note: An exclamation mark (!) indicates a result that was not dispersed into the flowsheet. Document Creation Date: 11/28/2009 11:49 AM _______________________________________________________________________  (1) Order result status: Final Collection or observation date-time: 11/28/2009 11:23 Requested date-time:  Receipt date-time:  Reported date-time:  Referring Physician:   Ordering Physician: Lina Sar [phone]) Specimen Source:  Source: Launa Grill Order Number: [id number] Lab site:   Appended Document: Colonoscopy labs entered in [facility] for 01/09/10.  I will give them a reminder call

## 2010-05-01 NOTE — Assessment & Plan Note (Signed)
Summary: throwing up/dlo   Vital Signs:  Patient profile:   77 year old Reese Height:      62 inches Weight:      194.4 pounds BMI:     35.68 Temp:     98.7 degrees F oral Pulse rate:   72 / minute Pulse rhythm:   regular BP sitting:   120 / 76  (left arm) Cuff size:   large  Vitals Entered By: Benny Lennert CMA Duncan Dull) (November 01, 2009 12:28 PM)  History of Present Illness: NW:GNFAOZHY up  89  year olf diabetic with hx of CAD, PUD  In last few weeks nausea and vomit in AMs...before eating.  Not worsening. Occ sour taste in mouth, heartburn occuring every few days. No abdominal pain. Some abdominal gas.  Occ diarrhea, no constipation.  No blood in stool...Marland Kitchenocc dark stool. No fever. Some difficulty swallowing liquid more than solids..no pain with swallowing...occ feels like water wants to come back up.  No new meds... Dr. Juanda Chance told to start pepcid today for reflux.   DM well controlled.  Not on any NSAID, takes ASA regularly  Anticoagulation Management History:      Positive risk factors for bleeding include an age of 40 years or older, history of GI bleeding, and presence of serious comorbidities.  The bleeding index is 'high risk'.  Positive CHADS2 values include History of CHF, History of HTN, Age > 6 years old, and History of Diabetes.  Her last INR was 1.1.     Problems Prior to Update: 1)  Nausea and Vomiting  (ICD-787.01) 2)  Abdominal Mass  (ICD-789.30) 3)  Acute Bronchitis  (ICD-466.0) 4)  Hand Pain, Right  (ICD-729.5) 5)  Adhesive Capsulitis, Right  (ICD-726.0) 6)  Coronary Atherosclerosis Native Coronary Artery  (ICD-414.01) 7)  Shoulder Pain, Right  (ICD-719.41) 8)  Shoulder Strain, Right Trapezius  (ICD-840.9) 9)  Special Screening Malig Neoplasms Other Sites  (ICD-V76.49) 10)  Encounter For Long-term Use of Other Medications  (ICD-V58.69) 11)  Hypotension, Orthostatic  (ICD-458.0) 12)  Migraine Without Aura  (ICD-346.10) 13)  Uti   (ICD-599.0) 14)  Adult Failure To Thrive  (ICD-783.7) 15)  Syncope  (ICD-780.2) 16)  Dizziness  (ICD-780.4) 17)  Headache  (ICD-784.0) 18)  Need Prophylactic Vaccination&inoculation Flu  (ICD-V04.81) 19)  Gi Bleed  (ICD-578.9) 20)  Renal Artery Stenosis  (ICD-440.1) 21)  Atrial Fibrillation  (ICD-427.31) 22)  Cad, Autologous Bypass Graft  (ICD-414.02) 23)  Diastolic Heart Failure, Chronic  (ICD-428.32) 24)  Microscopic Hematuria  (ICD-599.72) 25)  Vaginal Bleeding  (ICD-623.8) 26)  Compression Fracture, L1 Vertebra  (ICD-805.4) 27)  Degenerative Disc Disease  (ICD-722.6) 28)  Polyneuropathy in Diabetes  (ICD-357.2) 29)  Depression  (ICD-311) 30)  Edema Leg  (ICD-782.3) 31)  Acute Posthemorrhagic Anemia  (ICD-285.1) 32)  Weakness  (ICD-780.79) 33)  Congestive Heart Failure, Chronic  (ICD-428.0) 34)  Congestive Heart Failure, Acute  (ICD-428.0) 35)  Gout, Acute  (ICD-274.9) 36)  Diverticulitis  (ICD-562.11) 37)  Herpes Zoster  (ICD-053.9) 38)  Back Pain, Right  (ICD-724.5) 39)  Encounter For Therapeutic Drug Monitoring  (ICD-V58.83) 40)  Hypercholesterolemia, Pure  (ICD-272.0) 41)  Anticoagulation Therapy  (ICD-V58.61) 42)  Cellulitis/abscess, Leg  (ICD-682.6) 43)  Coumadin Therapy  (ICD-V58.61) 44)  Carotid Bruits, Bilateral R Greater Than L  (ICD-785.9) 45)  Anemia, Iron Deficiency 10/07  (ICD-280.9) 46)  Carotid Artery Stenosis (MILD) Both 12/05  (ICD-433.10) 47)  Glaucoma (DR. GROAT)  (ICD-365.9) 48)  Pulmonary Embolism, Hx  of  (ICD-V12.51) 49)  Peripheral Vascular Disease  (ICD-443.9) 50)  Peptic Ulcer Disease  (ICD-533.Donna) 51)  Osteopenia  (ICD-733.Donna) 52)  Hypertension  (ICD-401.9) 53)  Hyperlipidemia  (ICD-272.4) 54)  Dvt, Hx of  (ICD-V12.51) 55)  Diverticulosis, Colon  (ICD-562.10) 56)  Diabetes Mellitus, Type II  (ICD-250.00) 57)  Coronary Artery Disease  (ICD-414.00)  Current Medications (verified): 1)  Furosemide 80 Mg Tabs (Furosemide) .... Take One and 1/2  Tablets in The Morning and One Tablet in The Evening 2)  Cozaar 50 Mg Tabs (Losartan Potassium) .Marland Kitchen.. 1 Twice Daily 3)  Vytorin 10-20 Mg Tabs (Ezetimibe-Simvastatin) .... Take One Tab By Mouth Once Daily 4)  Plavix 75 Mg Tabs (Clopidogrel Bisulfate) .Marland Kitchen.. 1 Daily By Mouth 5)  Aspirin 81 Mg Tbec (Aspirin) .... Take One Tablet By Mouth Daily 6)  Sertraline Hcl 100 Mg Tabs (Sertraline Hcl) .... Daily 7)  Norvasc 10 Mg Tabs (Amlodipine Besylate) .... Take One Tab By Mouth Once Daily 8)  Toprol Xl 25 Mg Xr24h-Tab (Metoprolol Succinate) .Marland Kitchen.. 1 By Mouth Daily 9)  Midodrine Hcl 2.5 Mg Tabs (Midodrine Hcl) .Marland Kitchen.. 1 By Mouth Three Times A Day 10)  Isosorbide Mononitrate Cr 30 Mg Xr24h-Tab (Isosorbide Mononitrate) .... Take 1 Tablet By Mouth Once A Day 11)  Xalatan 0.005 % Soln (Latanoprost) .... One Drop Each Eye Daily 12)  Benazepril Hcl 20 Mg Tabs (Benazepril Hcl) .... Take 1 Tablet By Mouth Once A Day 13)  Nitrostat 0.4 Mg Subl (Nitroglycerin) .Marland Kitchen.. 1 Tablet Under Tongue At Onset of Chest Pain; You May Repeat Every 5 Minutes For Up To 3 Doses. 14)  Pepcid 20 Mg Tabs (Famotidine) .... Take One Tab By Mouth Two Times A Day  Allergies: 1)  ! Pcn 2)  ! Codeine 3)  ! Demerol 4)  ! Morphine 5)  ! Darvon 6)  ! Sulfa 7)  ! * Niaspan  Past History:  Past medical, surgical, family and social histories (including risk factors) reviewed, and no changes noted (except as noted below).  Past Medical History: Reviewed history from 10/30/2008 and no changes required. Diabetes mellitus, type II 8/05 Diverticulosis, colon inflamed hemorrhoids 03/13/06 Osteopenia 09/1999, Dexa 8/05 Osteopenia Peptic ulcer disease Peripheral vascular disease 1. Diastolic heart failure 2. Coronary artery disease, status post prior coronary bypass graft     surgery with  patent grafts  2010 3. Renovascular hypertension. 4. Renal artery stenosis, status post recent renal artery stenting on     the left. 5. History of paroxysmal  atrial fibrillation, not a Coumadin     candidate. 6. History of deep vein thrombophlebitis and pulmonary embolism,     status post IV filter placement. 7. History of gastrointestinal bleeding. 8. Diabetes. 9. Hypertension. 10. S/P AAA repair 10.Hyperlipidemia.        . .  Past Surgical History: Reviewed history from 11/22/2008 and no changes required. Appendectomy Coronary artery bypass graft 09/04/01 Cataract extraction Both eyes with IOL 2001 Cholecystectomy Hysterectomy (total) Carotid U/S unchanged 40-50% ICA stenosis--12/1998 Myoview [place]. Neg EF 76%--06/26/2004 Carotid doppler 12/05, unchanged 9/06 MRI Lumbar Spine Neg, Neurosurg 10/04 Cardiac cath 09/03/01 Stress cardiolite LVF 81%--8/01,  3/06 neg Renal U/S Unsatisfactory study 8/01 L Heart Cath 01/20/06 AAA Repair 1997 Myocardial perfusion EF 67% 10/07 EGD Abn. 12/07--esophageal stricture 1/09--06/30/07--gastritis and major bleed Colonoscopy Abn. 03/13/06 EMG--06/17/07--severd  DM neuropathy and radiculopathy---[facility]--06/27/2008--feet, abnormal MRI back--3/09--old compression fx L1, DDD--[facility] placement of inferior vena cava filter --07/03/07--[facility] blood transfusions x6 with GI bleed 3/27-07/03/07 Adenosin  stress test 08/17/07---low risk stent to R kidney 8/09 renal artery doppler--11/17/2008--severe L common iliac stenosis  Family History: Reviewed history from 08/23/2008 and no changes required.  Mother deceased from cancer.  Father deceased from a   CVA.  She had one brother who died from CVA and 3 brothers who died from   MI.   Social History: Reviewed history from 08/23/2008 and no changes required. She lives in [place].  She is married.  She quit   smoking in 1992, but she has a former history of 40-pack years.   Negative for EtOH.  Negative for drug use.  She uses a walker in the   nursing home for ambulation.      Review of Systems General:  Denies fatigue and fever. CV:   Denies chest pain or discomfort. Resp:  Denies shortness of breath. GU:  Denies dysuria. Derm:  Denies rash.  Physical Exam  General:  overweight elderly appearing Reese inNAD Mouth:  Oral mucosa and oropharynx without lesions or exudates.  Teeth in good repair. Neck:  no carotid bruit or thyromegaly no cervical or supraclavicular lymphadenopathy  Lungs:  Normal respiratory effort, chest expands symmetrically. Lungs are clear to auscultation, no crackles or wheezes. Heart:  Normal rate and regular rhythm. S1 and S2 normal without gallop, murmur, click, rub or other extra sounds. Abdomen:  Bowel sounds positive,abdomen soft and non-tender without masses, organomegaly or hernias noted. Pulses:  R and L posterior tibial pulses are full and equal bilaterally  Extremities:  no edema   Impression & Recommendations:  Problem # 1:  NAUSEA AND VOMITING (ICD-787.01) Most consistent with GERD/gastritis. Has history of PUD .Marland Kitchenno current abdominal pain. Start PPI if pepcid not effective. Follow up if not continuing to improve.  Instructions given of lifestyla nd dietary changes.  Complete Medication List: 1)  Furosemide 80 Mg Tabs (Furosemide) .... Take one and 1/2 tablets in the morning and one tablet in the evening 2)  Cozaar 50 Mg Tabs (Losartan potassium) .Marland Kitchen.. 1 twice daily 3)  Vytorin 10-20 Mg Tabs (Ezetimibe-simvastatin) .... Take one tab by mouth once daily 4)  Plavix 75 Mg Tabs (Clopidogrel bisulfate) .Marland Kitchen.. 1 daily by mouth 5)  Aspirin 81 Mg Tbec (Aspirin) .... Take one tablet by mouth daily 6)  Sertraline Hcl 100 Mg Tabs (Sertraline hcl) .... Daily 7)  Norvasc 10 Mg Tabs (Amlodipine besylate) .... Take one tab by mouth once daily 8)  Toprol Xl 25 Mg Xr24h-tab (Metoprolol succinate) .Marland Kitchen.. 1 by mouth daily 9)  Midodrine Hcl 2.5 Mg Tabs (Midodrine hcl) .Marland Kitchen.. 1 by mouth three times a day 10)  Isosorbide Mononitrate Cr 30 Mg Xr24h-tab (Isosorbide mononitrate) .... Take 1 tablet by mouth once a  day 11)  Xalatan 0.005 % Soln (Latanoprost) .... One drop each eye daily 12)  Benazepril Hcl 20 Mg Tabs (Benazepril hcl) .... Take 1 tablet by mouth once a day 13)  Nitrostat 0.4 Mg Subl (Nitroglycerin) .Marland Kitchen.. 1 tablet under tongue at onset of chest pain; you may repeat every 5 minutes for up to 3 doses. 14)  Pepcid 20 Mg Tabs (Famotidine) .... Take one tab by mouth two times a day  Anticoagulation Management Assessment/Plan:      The next INR is due none.        Patient Instructions: 1)  Avoiding tomatos, citris,, spicy, peppermint, chocolate, caffeine, large meals. 2)  Try pepcid AC two times a day for 3-4 days as instructed by Dr. Juanda Chance...but if not improving start  prilosec OTC 2 tabs 20 mg daily. 3)   Follow up in 2-3 weeks with Dr. Patsy Lager  if nausea and vomting not better on prilosec.  Current Allergies (reviewed today): ! PCN ! CODEINE ! DEMEROL ! MORPHINE ! DARVON ! SULFA ! * NIASPAN

## 2010-05-01 NOTE — Assessment & Plan Note (Signed)
Summary: f51m   Visit Type:  Follow-up Primary Provider:  Hannah Beat MD  CC:  Fluid retention- Sob.  History of Present Illness: Donna Reese is a 77 years returned for management of her see him in CHF after her recent hospitalization renal failure. She had remote bypass surgery and had patent grafts at catheterization in 2010. She has chronic diastolic heart failure. She also has paroxysmal atrial fibrillation and is now felt to be a Coumadin candidate. She also has peripheral vascular disease status post I am grafting.  She saw Dr. Excell Seltzer recently with leg pain and decreased lower extremity indices. A CT scan which showed stenoses at the distal anastomoses-bypass graft. The contrast and contrast and apathy with creatinines up to 7. She also had some rectal bleeding. Her creatinine improved and is down to one .last week.   She has chronic obstructive pulmonary disease and is on home O2.  She feels that her fluid status is pretty good. Her weight at home is 165 pounds. She has some slight edema but no increased shortness of breath.  Current Medications (verified): 1)  Furosemide 80 Mg Tabs (Furosemide) .Marland Kitchen.. 1 Tab Two Times A Day 2)  Vytorin 10-20 Mg Tabs (Ezetimibe-Simvastatin) .... Take One Tablet By Mouth Daily At Bedtime 3)  Norvasc 10 Mg Tabs (Amlodipine Besylate) .... Take One Tab By Mouth Once Daily 4)  Isosorbide Mononitrate Cr 30 Mg Xr24h-Tab (Isosorbide Mononitrate) .... Take 1 Tablet By Mouth Once A Day 5)  Xalatan 0.005 % Soln (Latanoprost) .... One Drop Each Eye Daily 6)  Nitrostat 0.4 Mg Subl (Nitroglycerin) .Marland Kitchen.. 1 Tablet Under Tongue At Onset of Chest Pain; You May Repeat Every 5 Minutes For Up To 3 Doses. 7)  Pantoprazole Sodium 40 Mg Tbec (Pantoprazole Sodium) .Marland Kitchen.. 1 Tab Once Daily 8)  Klor-Con 10 10 Meq Cr-Tabs (Potassium Chloride) .... Take 1/2  Tablet Daily 9)  Ketorolac Ophthalimic Solution 0.4 % .... Rt Eye 4 Times A Day 10)  Oxycodone Hcl 5 Mg Tabs (Oxycodone Hcl)  .Marland Kitchen.. 1 Tab As Needed For Pain 11)  Benazepril Hcl 10 Mg Tabs (Benazepril Hcl) .... Take 1 Tablet By Mouth Once A Day 12)  Mirtazapine 15 Mg Tabs (Mirtazapine) .Marland Kitchen.. 1 By Mouth At Bedtime 13)  Dulera 100-5 Mcg/act Aero (Mometasone Furo-Formoterol Fum) .Marland Kitchen.. 1 Puff Two Times A Day 14)  Flovent Hfa 44 Mcg/act Aero (Fluticasone Propionate  Hfa) .Marland Kitchen.. 1 Puff Two Times A Day 15)  Oxigen 2 Litters  Allergies: 1)  ! Pcn 2)  ! Codeine 3)  ! Demerol 4)  ! Morphine 5)  ! Darvon 6)  ! Sulfa 7)  ! * Niaspan  Past History:  Past Medical History: Reviewed history from 01/04/2010 and no changes required. Diabetes mellitus, type II 8/05 COPD, on oxygen Diverticulosis, colon inflamed hemorrhoids 03/13/06 Osteopenia 09/1999, Dexa 8/05 Osteopenia Peptic ulcer disease Peripheral vascular disease 1. Diastolic heart failure 2. Coronary artery disease, status post prior coronary bypass graft     surgery with  patent grafts  2010 3. Renovascular hypertension. 4. Renal artery stenosis, status post recent renal artery stenting on     the left. 5. History of paroxysmal atrial fibrillation, not a Coumadin     candidate. 6. History of deep vein thrombophlebitis and pulmonary embolism,     status post IV filter placement. 7. History of gastrointestinal bleeding. 8. Diabetes. 9. Hypertension. 10. S/P AAA repair 10.Hyperlipidemia.  11. gastric ersions       . Marland Kitchen  Review of  Systems       ROS is negative except as outlined in HPI.   Vital Signs:  Patient profile:   77 year old female Height:      62 inches Weight:      165 pounds BMI:     30.29 Pulse rate:   82 / minute Pulse rhythm:   irregular Resp:     20 per minute BP sitting:   148 / 60  (left arm) Cuff size:   large  Vitals Entered By: Vikki Ports (February 27, 2010 1:48 PM)  Physical Exam  Additional Exam:  Gen. Well-nourished, in no distress   Neck: JVD 1-2 cm above the clavicle, thyroid not enlarged, no carotid bruits Lungs:  No tachypnea, clear without rales, rhonchi or wheezes Cardiovascular: Rhythm irregular, PMI not displaced,  heart sounds  normal, no murmurs or gallops, 1+ bilateral peripheral edema, pulses normal in all 4 extremities. Abdomen: BS normal, abdomen soft and non-tender without masses or organomegaly, no hepatosplenomegaly. MS: No deformities, no cyanosis or clubbing   Neuro:  No focal sns   Skin:  no lesions    Impression & Recommendations:  Problem # 1:  DIASTOLIC HEART FAILURE, CHRONIC (ICD-428.32) She has chronic diastolic heart failure. She has only mild volume overload today which is pretty good and considering her renal function I think we should continue her current medications. We will get a BMP today. We'll have her see Dr. Excell Seltzer back in followup in 2 months. The following medications were removed from the medication list:    Toprol Xl 50 Mg Xr24h-tab (Metoprolol succinate) .Marland Kitchen... Take one tablet by mouth daily    Cozaar 25 Mg Tabs (Losartan potassium) .Marland Kitchen... Take one tablet by mouth two times a day Her updated medication list for this problem includes:    Furosemide 80 Mg Tabs (Furosemide) .Marland Kitchen... 1 tab two times a day    Norvasc 10 Mg Tabs (Amlodipine besylate) .Marland Kitchen... Take one tab by mouth once daily    Isosorbide Mononitrate Cr 30 Mg Xr24h-tab (Isosorbide mononitrate) .Marland Kitchen... Take 1 tablet by mouth once a day    Nitrostat 0.4 Mg Subl (Nitroglycerin) .Marland Kitchen... 1 tablet under tongue at onset of chest pain; you may repeat every 5 minutes for up to 3 doses.    Benazepril Hcl 10 Mg Tabs (Benazepril hcl) .Marland Kitchen... Take 1 tablet by mouth once a day  Problem # 2:  RENAL INSUFFICIENCY (ICD-588.9) She had acute renal failure due to contrast-induced nephropathy after her CT scan. She recovered. We will recheck her creatinine today.  Problem # 3:  CORONARY ATHEROSCLEROSIS NATIVE CORONARY ARTERY (ICD-414.01) She has had previous bypass surgery. She's had no recent chest pain. This problem appears  stable. The following medications were removed from the medication list:    Toprol Xl 50 Mg Xr24h-tab (Metoprolol succinate) .Marland Kitchen... Take one tablet by mouth daily Her updated medication list for this problem includes:    Norvasc 10 Mg Tabs (Amlodipine besylate) .Marland Kitchen... Take one tab by mouth once daily    Isosorbide Mononitrate Cr 30 Mg Xr24h-tab (Isosorbide mononitrate) .Marland Kitchen... Take 1 tablet by mouth once a day    Nitrostat 0.4 Mg Subl (Nitroglycerin) .Marland Kitchen... 1 tablet under tongue at onset of chest pain; you may repeat every 5 minutes for up to 3 doses.    Benazepril Hcl 10 Mg Tabs (Benazepril hcl) .Marland Kitchen... Take 1 tablet by mouth once a day  Problem # 4:  ATRIAL FIBRILLATION (ICD-427.31) She has chronic atrial fibrillation. She is not  felt to be a Coumadin candidate. She is not at any rate control medicines but her rate is good today without them. She says she feels better without so many medications. The following medications were removed from the medication list:    Toprol Xl 50 Mg Xr24h-tab (Metoprolol succinate) .Marland Kitchen... Take one tablet by mouth daily  Problem # 5:  PERIPHERAL VASCULAR DISEASE (ICD-443.9) She has peripheral vascular disease and has had previous aortobifem bypass grafting. She also has renal stents. Dr. Excell Seltzer has seen her for these problems.  Other Orders: EKG w/ Interpretation (93000) TLB-BMP (Basic Metabolic Panel-BMET) (80048-METABOL) TLB-CBC Platelet - w/Differential (85025-CBCD)  Patient Instructions: 1)  Labwork today: bmet/cbc (428.22;414.01) 2)  Your physician recommends that you schedule a follow-up appointment in: 2 months with Dr. Excell Seltzer. 3)  Your physician recommends that you continue on your current medications as directed. Please refer to the Current Medication list given to you today.  Past History:  Past Medical History: Reviewed history from 01/04/2010 and no changes required. Diabetes mellitus, type II 8/05 COPD, on oxygen Diverticulosis, colon inflamed  hemorrhoids 03/13/06 Osteopenia 09/1999, Dexa 8/05 Osteopenia Peptic ulcer disease Peripheral vascular disease 1. Diastolic heart failure 2. Coronary artery disease, status post prior coronary bypass graft     surgery with  patent grafts  2010 3. Renovascular hypertension. 4. Renal artery stenosis, status post recent renal artery stenting on     the left. 5. History of paroxysmal atrial fibrillation, not a Coumadin     candidate. 6. History of deep vein thrombophlebitis and pulmonary embolism,     status post IV filter placement. 7. History of gastrointestinal bleeding. 8. Diabetes. 9. Hypertension. 10. S/P AAA repair 10.Hyperlipidemia.  11. gastric ersions       . Marland Kitchen   Review of Systems       ROS is negative except as outlined in HPI.    Physical Exam  Additional Exam:  Gen. Well-nourished, in no distress   Neck: JVD 1-2 cm above the clavicle, thyroid not enlarged, no carotid bruits Lungs: No tachypnea, clear without rales, rhonchi or wheezes Cardiovascular: Rhythm irregular, PMI not displaced,  heart sounds  normal, no murmurs or gallops, 1+ bilateral peripheral edema, pulses normal in all 4 extremities. Abdomen: BS normal, abdomen soft and non-tender without masses or organomegaly, no hepatosplenomegaly. MS: No deformities, no cyanosis or clubbing   Neuro:  No focal sns   Skin:  no lesions    Impression & Recommendations:  Problem # 1:  DIASTOLIC HEART FAILURE, CHRONIC (ICD-428.32) She has chronic diastolic heart failure. She has only mild volume overload today which is pretty good and considering her renal function I think we should continue her current medications. We will get a BMP today. We'll have her see Dr. Excell Seltzer back in followup in 2 months. The following medications were removed from the medication list:    Toprol Xl 50 Mg Xr24h-tab (Metoprolol succinate) .Marland Kitchen... Take one tablet by mouth daily    Cozaar 25 Mg Tabs (Losartan potassium) .Marland Kitchen... Take one tablet by  mouth two times a day Her updated medication list for this problem includes:    Furosemide 80 Mg Tabs (Furosemide) .Marland Kitchen... 1 tab two times a day    Norvasc 10 Mg Tabs (Amlodipine besylate) .Marland Kitchen... Take one tab by mouth once daily    Isosorbide Mononitrate Cr 30 Mg Xr24h-tab (Isosorbide mononitrate) .Marland Kitchen... Take 1 tablet by mouth once a day    Nitrostat 0.4 Mg Subl (Nitroglycerin) .Marland Kitchen... 1 tablet under tongue  at onset of chest pain; you may repeat every 5 minutes for up to 3 doses.    Benazepril Hcl 10 Mg Tabs (Benazepril hcl) .Marland Kitchen... Take 1 tablet by mouth once a day  Problem # 2:  RENAL INSUFFICIENCY (ICD-588.9) She had acute renal failure due to contrast-induced nephropathy after her CT scan. She recovered. We will recheck her creatinine today.  Problem # 3:  CORONARY ATHEROSCLEROSIS NATIVE CORONARY ARTERY (ICD-414.01) She has had previous bypass surgery. She's had no recent chest pain. This problem appears stable. The following medications were removed from the medication list:    Toprol Xl 50 Mg Xr24h-tab (Metoprolol succinate) .Marland Kitchen... Take one tablet by mouth daily Her updated medication list for this problem includes:    Norvasc 10 Mg Tabs (Amlodipine besylate) .Marland Kitchen... Take one tab by mouth once daily    Isosorbide Mononitrate Cr 30 Mg Xr24h-tab (Isosorbide mononitrate) .Marland Kitchen... Take 1 tablet by mouth once a day    Nitrostat 0.4 Mg Subl (Nitroglycerin) .Marland Kitchen... 1 tablet under tongue at onset of chest pain; you may repeat every 5 minutes for up to 3 doses.    Benazepril Hcl 10 Mg Tabs (Benazepril hcl) .Marland Kitchen... Take 1 tablet by mouth once a day  Problem # 4:  ATRIAL FIBRILLATION (ICD-427.31) She has chronic atrial fibrillation. She is not felt to be a Coumadin candidate. She is not at any rate control medicines but her rate is good today without them. She says she feels better without so many medications. The following medications were removed from the medication list:    Toprol Xl 50 Mg Xr24h-tab (Metoprolol  succinate) .Marland Kitchen... Take one tablet by mouth daily  Problem # 5:  PERIPHERAL VASCULAR DISEASE (ICD-443.9) She has peripheral vascular disease and has had previous aortobifem bypass grafting. She also has renal stents. Dr. Excell Seltzer has seen her for these problems.  Other Orders: EKG w/ Interpretation (93000) TLB-BMP (Basic Metabolic Panel-BMET) (80048-METABOL) TLB-CBC Platelet - w/Differential (85025-CBCD)

## 2010-05-01 NOTE — Progress Notes (Signed)
Summary: refill meds  Phone Note Refill Request Call back at Home Phone [phone] Message from:  Patient on Aug 21, 2009 3:49 PM  Refills Requested: Medication #1:  NEURONTIN 300 MG  CAPS 1 tablet by mouth three times a day   Supply Requested: 3 months [facility] mail order / 3 refill    Method Requested: Fax to [facility] Initial call taken by: Lorne Skeens,  Aug 21, 2009 3:49 PM  Follow-up for Phone Call        Will fu  with RN on Thurs 08-24-2009 to see if Dr Juanda Chance wants to fill pt's neurotin Follow-up by: Burnett Kanaris, CNA,  Aug 23, 2009 3:58 PM  Additional Follow-up for Phone Call Additional follow up Details #1::        pt needs to get from pcp

## 2010-05-01 NOTE — Miscellaneous (Signed)
Summary: Order/[facility]  Order/[facility]   Imported By: Lester [place] 02/19/2010 14:05:49  _____________________________________________________________________  External Attachment:    Type:   Image     Comment:   External Document

## 2010-05-01 NOTE — Letter (Signed)
Summary: New Patient letter  [facility]  [address] [place], [place] [address]   Phone: [phone]  Fax: [phone]       11/07/2009 MRN: [id number]  Donna Reese [address] [place], [place]  [address]  Dear Donna Reese,  Welcome to the Gastroenterology Division at [facility].    You are scheduled to see Willette Cluster on 11-16-09 at 1:30P.M. on the 3rd floor at [facility], [address].  We ask that you try to arrive at our office 15 minutes prior to your appointment time to allow for check-in.  We would like you to complete the enclosed self-administered evaluation form prior to your visit and bring it with you on the day of your appointment.  We will review it with you.  Also, please bring a complete list of all your medications or, if you prefer, bring the medication bottles and we will list them.  Please bring your insurance card so that we may make a copy of it.  If your insurance requires a referral to see a specialist, please bring your referral form from your primary care physician.  Co-payments are due at the time of your visit and may be paid by cash, check or credit card.     Your office visit will consist of a consult with your physician (includes a physical exam), any laboratory testing he/she may order, scheduling of any necessary diagnostic testing (e.g. x-ray, ultrasound, CT-scan), and scheduling of a procedure (e.g. Endoscopy, Colonoscopy) if required.  Please allow enough time on your schedule to allow for any/all of these possibilities.    If you cannot keep your appointment, please call [phone] to cancel or reschedule prior to your appointment date.  This allows [place] the opportunity to schedule an appointment for another patient in need of care.  If you do not cancel or reschedule by 5 p.m. the business day prior to your appointment date, you will be charged a $50.00 late cancellation/no-show fee.    Thank you for  choosing [facility] for your medical needs.  We appreciate the opportunity to care for you.  Please visit [place] at our website  to learn more about our practice.                     Sincerely,                                                             The Gastroenterology Division

## 2010-05-01 NOTE — Assessment & Plan Note (Signed)
Summary: TOPS OF FEET RED AND SWOLLEN   Vital Signs:  Patient profile:   77 year old female Height:      62 inches Weight:      185.0 pounds BMI:     33.96 O2 Sat:      95 % on 2 L Temp:     97.7 degrees F oral Pulse rate:   88 / minute Pulse rhythm:   regular BP sitting:   130 / 60  (left arm) Cuff size:   large  Vitals Entered By: Benny Lennert CMA Duncan Dull) (November 15, 2009 9:21 AM)  O2 Flow:  2 L  History of Present Illness: 77 year old female recently hospitalized for  Upper GI bleed, severe anemia  due to gastric erosions. GI Dr. Leone Payor.  Currently holding aspirin and p;lavix.  CHF, mild exac..improved with IV lasix in hospital..Pt already notified per report.  to ake 120 AM/80 PM mg LAsix..but she only took 80 mg two times a day.  Told by Dr. Juanda Chance...2 days ago to increase to lasix 120 mg in AM and 80 mg in PM. SInce then minimal improvement in peripheral swelling, some increase.  No further chest pain. Home Health nurse concerned about redness  on tops of feet. Feet painful, hard to weight bear. On oxygen ..no sudden increase in SOB.  Weight stable over last 2 days.  No fever.     Problems Prior to Update: 1)  Hypertensive Cardiovascular Disease, Benign  (ICD-402.10) 2)  Chronic Systolic Heart Failure  (ICD-428.22) 3)  Gi Bleeding  (ICD-578.9) 4)  Anemia, Normocytic  (ICD-285.9) 5)  Nausea and Vomiting  (ICD-787.01) 6)  Abdominal Mass  (ICD-789.30) 7)  Acute Bronchitis  (ICD-466.0) 8)  Hand Pain, Right  (ICD-729.5) 9)  Adhesive Capsulitis, Right  (ICD-726.0) 10)  Coronary Atherosclerosis Native Coronary Artery  (ICD-414.01) 11)  Shoulder Pain, Right  (ICD-719.41) 12)  Shoulder Strain, Right Trapezius  (ICD-840.9) 13)  Special Screening Malig Neoplasms Other Sites  (ICD-V76.49) 14)  Encounter For Long-term Use of Other Medications  (ICD-V58.69) 15)  Hypotension, Orthostatic  (ICD-458.0) 16)  Migraine Without Aura  (ICD-346.10) 17)  Uti   (ICD-599.0) 18)  Adult Failure To Thrive  (ICD-783.7) 19)  Syncope  (ICD-780.2) 20)  Dizziness  (ICD-780.4) 21)  Headache  (ICD-784.0) 22)  Need Prophylactic Vaccination&inoculation Flu  (ICD-V04.81) 23)  Gi Bleed  (ICD-578.9) 24)  Renal Artery Stenosis  (ICD-440.1) 25)  Atrial Fibrillation  (ICD-427.31) 26)  Cad, Autologous Bypass Graft  (ICD-414.02) 27)  Diastolic Heart Failure, Chronic  (ICD-428.32) 28)  Microscopic Hematuria  (ICD-599.72) 29)  Vaginal Bleeding  (ICD-623.8) 30)  Compression Fracture, L1 Vertebra  (ICD-805.4) 31)  Degenerative Disc Disease  (ICD-722.6) 32)  Polyneuropathy in Diabetes  (ICD-357.2) 33)  Depression  (ICD-311) 34)  Edema Leg  (ICD-782.3) 35)  Acute Posthemorrhagic Anemia  (ICD-285.1) 36)  Weakness  (ICD-780.79) 37)  Congestive Heart Failure, Chronic  (ICD-428.0) 38)  Congestive Heart Failure, Acute  (ICD-428.0) 39)  Gout, Acute  (ICD-274.9) 40)  Diverticulitis  (ICD-562.11) 41)  Herpes Zoster  (ICD-053.9) 42)  Back Pain, Right  (ICD-724.5) 43)  Encounter For Therapeutic Drug Monitoring  (ICD-V58.83) 44)  Hypercholesterolemia, Pure  (ICD-272.0) 45)  Anticoagulation Therapy  (ICD-V58.61) 46)  Cellulitis/abscess, Leg  (ICD-682.6) 47)  Coumadin Therapy  (ICD-V58.61) 48)  Carotid Bruits, Bilateral R Greater Than L  (ICD-785.9) 49)  Anemia, Iron Deficiency 10/07  (ICD-280.9) 50)  Carotid Artery Stenosis (MILD) Both 12/05  (ICD-433.10) 51)  Glaucoma (  DR. Dione Booze)  (ICD-365.9) 52)  Pulmonary Embolism, Hx of  (ICD-V12.51) 53)  Peripheral Vascular Disease  (ICD-443.9) 54)  Peptic Ulcer Disease  (ICD-533.90) 55)  Osteopenia  (ICD-733.90) 56)  Hypertension  (ICD-401.9) 57)  Hyperlipidemia  (ICD-272.4) 58)  Dvt, Hx of  (ICD-V12.51) 59)  Diverticulosis, Colon  (ICD-562.10) 60)  Diabetes Mellitus, Type II  (ICD-250.00) 61)  Coronary Artery Disease  (ICD-414.00)  Current Medications (verified): 1)  Furosemide 80 Mg Tabs (Furosemide) .... Take One and 1/2  Tablets in The Morning and One Tablet in The Evening 2)  Cozaar 50 Mg Tabs (Losartan Potassium) .Marland Kitchen.. 1 Twice Daily 3)  Vytorin 10-20 Mg Tabs (Ezetimibe-Simvastatin) .... Take One Tab By Mouth Once Daily 4)  Plavix 75 Mg Tabs (Clopidogrel Bisulfate) .Marland Kitchen.. 1 Daily By Mouth 5)  Sertraline Hcl 100 Mg Tabs (Sertraline Hcl) .... Daily 6)  Norvasc 10 Mg Tabs (Amlodipine Besylate) .... Take One Tab By Mouth Once Daily 7)  Toprol Xl 25 Mg Xr24h-Tab (Metoprolol Succinate) .Marland Kitchen.. 1 By Mouth Daily 8)  Midodrine Hcl 2.5 Mg Tabs (Midodrine Hcl) .Marland Kitchen.. 1 By Mouth Three Times A Day 9)  Isosorbide Mononitrate Cr 30 Mg Xr24h-Tab (Isosorbide Mononitrate) .... Take 1 Tablet By Mouth Once A Day 10)  Xalatan 0.005 % Soln (Latanoprost) .... One Drop Each Eye Daily 11)  Benazepril Hcl 20 Mg Tabs (Benazepril Hcl) .... Take 1 Tablet By Mouth Once A Day 12)  Nitrostat 0.4 Mg Subl (Nitroglycerin) .Marland Kitchen.. 1 Tablet Under Tongue At Onset of Chest Pain; You May Repeat Every 5 Minutes For Up To 3 Doses. 13)  Ferrous Sulfate 325 (65 Fe) Mg Tbec (Ferrous Sulfate) .Marland Kitchen.. 1 Tab By Mouth Two Times A Day  Allergies: 1)  ! Pcn 2)  ! Codeine 3)  ! Demerol 4)  ! Morphine 5)  ! Darvon 6)  ! Sulfa 7)  ! * Niaspan  Past History:  Past medical, surgical, family and social histories (including risk factors) reviewed, and no changes noted (except as noted below).  Past Medical History: Reviewed history from 10/30/2008 and no changes required. Diabetes mellitus, type II 8/05 Diverticulosis, colon inflamed hemorrhoids 03/13/06 Osteopenia 09/1999, Dexa 8/05 Osteopenia Peptic ulcer disease Peripheral vascular disease 1. Diastolic heart failure 2. Coronary artery disease, status post prior coronary bypass graft     surgery with  patent grafts  2010 3. Renovascular hypertension. 4. Renal artery stenosis, status post recent renal artery stenting on     the left. 5. History of paroxysmal atrial fibrillation, not a Coumadin     candidate. 6.  History of deep vein thrombophlebitis and pulmonary embolism,     status post IV filter placement. 7. History of gastrointestinal bleeding. 8. Diabetes. 9. Hypertension. 10. S/P AAA repair 10.Hyperlipidemia.        . .  Past Surgical History: Reviewed history from 11/22/2008 and no changes required. Appendectomy Coronary artery bypass graft 09/04/01 Cataract extraction Both eyes with IOL 2001 Cholecystectomy Hysterectomy (total) Carotid U/S unchanged 40-50% ICA stenosis--12/1998 Myoview Adeno. Neg EF 76%--06/26/2004 Carotid doppler 12/05, unchanged 9/06 MRI Lumbar Spine Neg, Neurosurg 10/04 Cardiac cath 09/03/01 Stress cardiolite LVF 81%--8/01,  3/06 neg Renal U/S Unsatisfactory study 8/01 L Heart Cath 01/20/06 AAA Repair 1997 Myocardial perfusion EF 67% 10/07 EGD Abn. 12/07--esophageal stricture 1/09--06/30/07--gastritis and major bleed Colonoscopy Abn. 03/13/06 EMG--06/17/07--severd  DM neuropathy and radiculopathy---[facility]--06/27/2008--feet, abnormal MRI back--3/09--old compression fx L1, DDD--[facility] placement of inferior vena cava filter --07/03/07--[place] blood transfusions x6 with GI bleed 3/27-07/03/07 Adenosin stress test  08/17/07---low risk stent to R kidney 8/09 renal artery doppler--11/17/2008--severe L common iliac stenosis  Family History: Reviewed history from 08/23/2008 and no changes required.  Mother deceased from cancer.  Father deceased from a   CVA.  She had one brother who died from CVA and 3 brothers who died from   MI.   Social History: Reviewed history from 08/23/2008 and no changes required. She lives in [place].  She is married.  She quit   smoking in 1992, but she has a former history of 40-pack years.   Negative for EtOH.  Negative for drug use.  She uses a walker in the   nursing home for ambulation.      Review of Systems General:  Complains of fatigue; denies fever. CV:  Complains of chest pain or discomfort. Resp:   Denies cough, sputum productive, and wheezing. GI:  Denies abdominal pain, bloody stools, dark tarry stools, and vomiting blood. GU:  Denies dysuria.  Physical Exam  General:  overweight appearing female wearin oxygen inNAD Mouth:  Oral mucosa and oropharynx without lesions or exudates.  Teeth in good repair. Neck:  No deformities, masses, or tenderness noted. Lungs:  Normal respiratory effort, chest expands symmetrically. Lungs are clear to auscultation, no crackles or wheezes. Heart:  Normal rate and regular rhythm. S1 and S2 normal without gallop, 2/6 sys murmur,no click, rub or other extra sounds.  NO JVD Abdomen:  Bowel sounds positive,abdomen soft and non-tender without masses, organomegaly or hernias noted. Pulses:  R and L posterior tibial pulses are full and equal bilaterally  Extremities:  2+ left pedal edema and 2+ right pedal edema.    B dorsal feet with redness, warmth   Impression & Recommendations:  Problem # 1:  CHRONIC SYSTOLIC HEART FAILURE (ICD-428.22) Discussed pt case with Dr. Juanda Chance, pts' cardiologist. No sign of pulmonary edema on exam.  Poor improvement with edema with current lasix dose. Increase to 160mg  AM/120mg  PM. Close follow up in 2 days.  If no improvement will likely need to add zaroxyln.   The following medications were removed from the medication list:    Aspirin 81 Mg Tbec (Aspirin) .Marland Kitchen... Take one tablet by mouth daily Her updated medication list for this problem includes:    Furosemide 80 Mg Tabs (Furosemide) .Marland Kitchen... Take one and 1/2 tablets in the morning and one tablet in the evening    Cozaar 50 Mg Tabs (Losartan potassium) .Marland Kitchen... 1 twice daily    Plavix 75 Mg Tabs (Clopidogrel bisulfate) .Marland Kitchen... 1 daily by mouth    Toprol Xl 25 Mg Xr24h-tab (Metoprolol succinate) .Marland Kitchen... 1 by mouth daily    Benazepril Hcl 20 Mg Tabs (Benazepril hcl) .Marland Kitchen... Take 1 tablet by mouth once a day  Orders: TLB-BMP (Basic Metabolic Panel-BMET) (80048-METABOL)  Problem #  2:  EDEMA (ICD-782.3) No clear sign of infection..redness in B feet likely due to peripheral swelling.  Will check wbc to make sure not increased..Pt already notified per report.  to call if redness spreading or fever.  Her updated medication list for this problem includes:    Furosemide 80 Mg Tabs (Furosemide) .Marland Kitchen... Take one and 1/2 tablets in the morning and one tablet in the evening  Orders: TLB-CBC Platelet - w/Differential (85025-CBCD) TLB-BMP (Basic Metabolic Panel-BMET) (80048-METABOL)  Problem # 3:  GI BLEEDING (ICD-578.9) No current nausea/vomiting... no bleeding seen.  Reeval for stability of anema with cbc.  Problem # 4:  CORONARY ATHEROSCLEROSIS NATIVE CORONARY ARTERY (ICD-414.01) remain off plavix and asa  until CV follow up, after colonoscopy as planned.  The following medications were removed from the medication list:    Aspirin 81 Mg Tbec (Aspirin) .Marland Kitchen... Take one tablet by mouth daily Her updated medication list for this problem includes:    Furosemide 80 Mg Tabs (Furosemide) .Marland Kitchen... Take one and 1/2 tablets in the morning and one tablet in the evening    Cozaar 50 Mg Tabs (Losartan potassium) .Marland Kitchen... 1 twice daily    Plavix 75 Mg Tabs (Clopidogrel bisulfate) .Marland Kitchen... 1 daily by mouth    Norvasc 10 Mg Tabs (Amlodipine besylate) .Marland Kitchen... Take one tab by mouth once daily    Toprol Xl 25 Mg Xr24h-tab (Metoprolol succinate) .Marland Kitchen... 1 by mouth daily    Isosorbide Mononitrate Cr 30 Mg Xr24h-tab (Isosorbide mononitrate) .Marland Kitchen... Take 1 tablet by mouth once a day    Benazepril Hcl 20 Mg Tabs (Benazepril hcl) .Marland Kitchen... Take 1 tablet by mouth once a day    Nitrostat 0.4 Mg Subl (Nitroglycerin) .Marland Kitchen... 1 tablet under tongue at onset of chest pain; you may repeat every 5 minutes for up to 3 doses.  Complete Medication List: 1)  Furosemide 80 Mg Tabs (Furosemide) .... Take one and 1/2 tablets in the morning and one tablet in the evening 2)  Cozaar 50 Mg Tabs (Losartan potassium) .Marland Kitchen.. 1 twice daily 3)   Vytorin 10-20 Mg Tabs (Ezetimibe-simvastatin) .... Take one tab by mouth once daily 4)  Plavix 75 Mg Tabs (Clopidogrel bisulfate) .Marland Kitchen.. 1 daily by mouth 5)  Sertraline Hcl 100 Mg Tabs (Sertraline hcl) .... Daily 6)  Norvasc 10 Mg Tabs (Amlodipine besylate) .... Take one tab by mouth once daily 7)  Toprol Xl 25 Mg Xr24h-tab (Metoprolol succinate) .Marland Kitchen.. 1 by mouth daily 8)  Midodrine Hcl 2.5 Mg Tabs (Midodrine hcl) .Marland Kitchen.. 1 by mouth three times a day 9)  Isosorbide Mononitrate Cr 30 Mg Xr24h-tab (Isosorbide mononitrate) .... Take 1 tablet by mouth once a day 10)  Xalatan 0.005 % Soln (Latanoprost) .... One drop each eye daily 11)  Benazepril Hcl 20 Mg Tabs (Benazepril hcl) .... Take 1 tablet by mouth once a day 12)  Nitrostat 0.4 Mg Subl (Nitroglycerin) .Marland Kitchen.. 1 tablet under tongue at onset of chest pain; you may repeat every 5 minutes for up to 3 doses. 13)  Ferrous Sulfate 325 (65 Fe) Mg Tbec (Ferrous sulfate) .Marland Kitchen.. 1 tab by mouth two times a day  Patient Instructions: 1)  Increase lasix to 2 tabs in AM and 1and 1/2 in PM 2)  Follow weights daily, record and bring to OV 3)  Follow up on Friday 30 min appt if able.  Current Allergies (reviewed today): ! PCN ! CODEINE ! DEMEROL ! MORPHINE ! DARVON ! SULFA ! * NIASPAN

## 2010-05-01 NOTE — Miscellaneous (Signed)
Summary: Care Plans/[facility]  Care Plans/[facility]   Imported By: Sherian Rein 01/30/2010 08:26:13  _____________________________________________________________________  External Attachment:    Type:   Image     Comment:   External Document

## 2010-05-01 NOTE — Letter (Signed)
Summary: CMN for [facility]  CMN for [facility]   Imported By: Lanelle Bal 11/29/2009 08:27:27  _____________________________________________________________________  External Attachment:    Type:   Image     Comment:   External Document

## 2010-05-01 NOTE — Letter (Signed)
Summary: Eye Exam/[facility]  Eye Exam/[facility]   Imported By: Maryln Gottron 01/04/2010 15:43:23  _____________________________________________________________________  External Attachment:    Type:   Image     Comment:   External Document  Appended Document: Eye Exam/[facility] update DM flowsheet  Appended Document: Eye Exam/[facility] done.[name] CMA

## 2010-05-01 NOTE — Procedures (Signed)
Summary: [facility] COLON   Colonoscopy  Procedure date:  03/13/2006  Findings:      Location:  [facility].   Patient Name: Donna Reese, Donna Reese. MRN:  Procedure Procedures: Colonoscopy CPT: [id number].  Personnel: Endoscopist: Ulyess Mort, MD.  Exam Location: Exam performed in Outpatient Clinic. Outpatient  Patient Consent: Procedure, Alternatives, Risks and Benefits discussed, consent obtained, from patient. Consent was obtained by the RN.  Indications Symptoms: Weight Loss. Abdominal pain / bloating. Change in bowel habits.  Average Risk Screening Routine.  History  Current Medications: Patient is not currently taking Coumadin.  Pre-Exam Physical: Entire physical exam was normal.  Comments: Pt. history reviewed/updated, physical exam performed prior to initiation of sedation? Exam Exam: Extent of exam reached: Cecum, extent intended: Cecum.  The cecum was identified by appendiceal orifice and IC valve. Colon retroflexion performed. Images taken. ASA Classification: III. Tolerance: good.  Monitoring: Pulse and BP monitoring, Oximetry used. Supplemental O2 given.  Colon Prep Prep results: good.  Sedation Meds: Patient assessed and found to be appropriate for moderate (conscious) sedation. Fentanyl 75 mcg. given IV. Versed 8 mg.  Findings - DIVERTICULOSIS: Transverse Colon to Sigmoid Colon. ICD9: Diverticulosis, Colon: 562.10. Comments: moild-mod.  - HEMORRHOIDS: Internal. ICD9: Hemorrhoids, Internal: 455.0.   Assessment Abnormal examination, see findings above.  Diagnoses: 562.10: Diverticulosis, Colon.  455.0: Hemorrhoids, Internal.   Events  Unplanned Interventions: No intervention was required.  Unplanned Events: There were no complications. Plans Medication Plan: Continue current medications.  Patient Education: Patient given standard instructions for: Diverticulosis. Hemorrhoids. Patient instructed to get routine colonoscopy every 8  years.  Disposition: After procedure patient sent to recovery. After recovery patient sent home.

## 2010-05-01 NOTE — Progress Notes (Signed)
Summary: chest pains  Phone Note Other Incoming Call back at [facility] Phone [phone] Call back at [phone]   Caller: Patient/adv Caller: [facility]/ Fleet Contras Summary of Call: Pt is swollen and her o2 stats are 92  and vital signs are stable and pt is having chest pains Initial call taken by: Judie Grieve,  November 13, 2009 12:39 PM  Follow-up for Phone Call        Called pt's home nurse Fleet Contras) she states that patient's weight went from 184 to 185, lungs are clear, but she has 2 plus pitting edema even though she elevates them when sitting and legs are painfull when she ambulates. She has had some episodes of chest pain that do not last very long not requiring NTG. She takes Lasix 80mg  two times a day. Advised will ask DOD for any other orders.   Follow-up by: Suzan Garibaldi RN  Additional Follow-up for Phone Call Additional follow up Details #1::        Discussed above with Dr. Shirlee Latch (DOD) and we looked at discharge summary that stated she needs to be on Lasix 120 mg in the am and 80 mg in the pm. He advised she go back on that dosing schedule and have a bmet in a few days and if not improved needs to see MD. Rosalita Levan ( home health nurse) back and advised her of above. She will make sure she corrects dose of lasix and will draw a bmet in a few days and keep [place] updated on her condition and let us know if she needs to be seen.  Additional Follow-up by: Suzan Garibaldi RN

## 2010-05-01 NOTE — Miscellaneous (Signed)
  Clinical Lists Changes        Diabetic Eye Exam Date:  01/01/2010 Diabetes Eye Exam Due:  1 yr

## 2010-05-01 NOTE — Assessment & Plan Note (Signed)
Summary: F/U [facility]   D/C 12/18/09/[facility]   Vital Signs:  Patient profile:   77 year old female Height:      62 inches Weight:      165.0 pounds BMI:     30.29 Temp:     97.9 degrees F oral Pulse rate:   60 / minute Pulse rhythm:   regular BP sitting:   110 / 60  (left arm) Cuff size:   large  Vitals Entered By: Benny Lennert CMA Duncan Dull) (January 04, 2010 2:53 PM)  History of Present Illness: Chief complaint D/c hospital 12-18-2009  Complex hospitalization where the patient had resp failure due to pulmonary edema, ARF Cr to 7 acutely with no prior history of renal disease significantly, lower gi bleed felt to be secondary to hemorrhoids.  At this point, d/c 12/18/2009.   Weakness: feels weak, lost 20 pounds, not eating well. No appetite. Nausea with eating. No vomitting.  COPD: change from flovent to combo Has been short of breath very easily, no on oxygen, using combivent 3-4 times a day, also albuterol nebs and proair as needed. Doing reasonably well on this.  Anemia, post bleed, cbc had stabilized, no further bleeding  ARF, cr returned to 1 on last recheck a few weeks ago.  Hospital Discharge Summary  Procedure date:  01/07/2010  Findings:      DISCHARGE DIAGNOSES: 1. Acute renal failure, most likely secondary to dehydration with     contrast nephropathy, improved. 2. Lower gastrointestinal bleed, possibly secondary to hemorrhoids. 3. Anemia. 4. Thrombocytopenia. 5. Hypertension. 6. Hypoxic respiratory failure secondary to acute pulmonary edema,     improved. 7. Coronary artery disease, status post coronary artery bypass graft. 8. History of pulmonary embolus and deep venous thrombosis, status     post inferior vena cava filter. 9. History of diabetes, type 2. 10.Hypertension, uncontrolled. 11.Acute bronchitis with chronic obstructive pulmonary disease     exacerbation, improved.  Allergies: 1)  ! Pcn 2)  ! Codeine 3)  ! Demerol 4)  ! Morphine 5)  !  Darvon 6)  ! Sulfa 7)  ! * Niaspan  Past History:  Past medical, surgical, family and social histories (including risk factors) reviewed, and no changes noted (except as noted below).  Past Medical History: Diabetes mellitus, type II 8/05 COPD, on oxygen Diverticulosis, colon inflamed hemorrhoids 03/13/06 Osteopenia 09/1999, Dexa 8/05 Osteopenia Peptic ulcer disease Peripheral vascular disease 1. Diastolic heart failure 2. Coronary artery disease, status post prior coronary bypass graft     surgery with  patent grafts  2010 3. Renovascular hypertension. 4. Renal artery stenosis, status post recent renal artery stenting on     the left. 5. History of paroxysmal atrial fibrillation, not a Coumadin     candidate. 6. History of deep vein thrombophlebitis and pulmonary embolism,     status post IV filter placement. 7. History of gastrointestinal bleeding. 8. Diabetes. 9. Hypertension. 10. S/P AAA repair 10.Hyperlipidemia.  11. gastric ersions       . Marland Kitchen  Past Surgical History: 11/2009: ARF, Cr to 7, Resp failure, COPD, intubated in ICU. Appendectomy Coronary artery bypass graft 09/04/01 Cataract extraction Both eyes with IOL 2001 Cholecystectomy Hysterectomy (total) Carotid U/S unchanged 40-50% ICA stenosis--12/1998 Myoview Adeno. Neg EF 76%--06/26/2004 Carotid doppler 12/05, unchanged 9/06 Cardiac cath 09/03/01 Renal U/S Unsatisfactory study 8/01 AAA Repair 1997 EGD Abn. 12/07--esophageal stricture 1/09--06/30/07--gastritis and major bleed Colonoscopy Abn. 03/13/06 EMG--06/17/07--severd  DM neuropathy and radiculopathy---[facility]--06/27/2008--feet, abnormal placement of inferior vena  cava filter --07/03/07--[facility] blood transfusions x6 with GI bleed 3/27-07/03/07 stent to R kidney 8/09 renal artery doppler--11/17/2008--severe L common iliac stenosis  Family History: Reviewed history from 11/27/2009 and no changes required.  according last visit. Mother deceased from  cancer.  Father deceased from a  CVA.  She had one brother who died from CVA and 3 brothers who died from  MI.   Social History: Reviewed history from 11/27/2009 and no changes required. She lives in [place].  She is married.  She quit  smoking in 1992, but she has a former history of 40-pack years.  Negative for EtOH.  Negative for drug use.  She uses a walker in the  nursing home for ambulation.      Review of Systems      See HPI       The patient complains of anorexia, weight loss, and dyspnea on exertion.  The patient denies fever, peripheral edema, and prolonged cough.    Physical Exam  General:  Well-developed,well-nourished,in no acute distress; alert,appropriate and cooperative throughout examination Head:  Normocephalic and atraumatic without obvious abnormalities. No apparent alopecia or balding. Ears:  no external deformities.   Nose:  no external deformity.   Neck:  No deformities, masses, or tenderness noted. Lungs:  Normal respiratory effort, chest expands symmetrically. Lungs are clear to auscultation, no crackles or wheezes. mildly decreased bs Heart:  Normal rate and regular rhythm. S1 and S2 normal without gallop, 2/6 sys murmur,no click, rub or other extra sounds.  NO JVD Extremities:  no c/c/d Neurologic:  alert & oriented X3.   Cervical Nodes:  No lymphadenopathy noted Psych:  Cognition and judgment appear intact. Alert and cooperative with normal attention span and concentration. No apparent delusions, illusions, hallucinations   Impression & Recommendations:  Problem # 1:  WEAKNESS (ICD-780.79) Assessment New Generalized weakness, weight loss, deconditioning due to hosp stay x 2, slow recovery. Not eating well  encouraged to eat. Try glucerna shakes Remeron at night to attempt to bolster appetite and weight gain  Problem # 2:  RENAL INSUFFICIENCY (ICD-588.9) Assessment: Improved  Orders: Venipuncture (04540) TLB-BMP (Basic Metabolic Panel-BMET)  (80048-METABOL)  Problem # 3:  ANEMIA, NORMOCYTIC (ICD-285.9) Assessment: Unchanged recheck  Her updated medication list for this problem includes:    Ferrous Sulfate 325 (65 Fe) Mg Tabs (Ferrous sulfate) .Marland Kitchen... Take one tab by mouth two times a day  Problem # 4:  COPD (ICD-496) Reviewed all meds -  trying to simplify.  decrease nebs to only as needed, goal to not need at all 02  combivent dulera (given 3 months in the office)  The following medications were removed from the medication list:    Flovent Hfa 44 Mcg/act Aero (Fluticasone propionate  hfa) .Marland Kitchen... 1 puff two times a day Her updated medication list for this problem includes:    Combivent 18-103 Mcg/act Aero (Ipratropium-albuterol) .Marland Kitchen... 1 puff bid    Dulera 200-5 Mcg/act Aero (Mometasone furo-formoterol fum) .Marland Kitchen... 2 puffs two times a day  Complete Medication List: 1)  Furosemide 80 Mg Tabs (Furosemide) .Marland Kitchen.. 1 tab two times a day 2)  Vytorin 10-40 Mg Tabs (Ezetimibe-simvastatin) .... Take 1/2 tab  once daily 3)  Sertraline Hcl 100 Mg Tabs (Sertraline hcl) .... Take 1 tablet by mouth once a day 4)  Norvasc 10 Mg Tabs (Amlodipine besylate) .... Take one tab by mouth once daily 5)  Toprol Xl 25 Mg Xr24h-tab (Metoprolol succinate) .Marland Kitchen.. 1 by mouth daily 6)  Midodrine Hcl  2.5 Mg Tabs (Midodrine hcl) .Marland Kitchen.. 1 by mouth three times a day 7)  Isosorbide Mononitrate Cr 30 Mg Xr24h-tab (Isosorbide mononitrate) .... Take 1 tablet by mouth once a day 8)  Xalatan 0.005 % Soln (Latanoprost) .... One drop each eye daily 9)  Nitrostat 0.4 Mg Subl (Nitroglycerin) .Marland Kitchen.. 1 tablet under tongue at onset of chest pain; you may repeat every 5 minutes for up to 3 doses. 10)  Pantoprazole Sodium 40 Mg Tbec (Pantoprazole sodium) .Marland Kitchen.. 1 tab once daily 11)  Klor-con 20 Meq Pack (Potassium chloride) .Marland Kitchen.. 1 tab by mouth daily 12)  Ketorolac Ophthalimic Solution 0.4 %  .... Rt eye 4 times a day 13)  Oxycodone Hcl 5 Mg Tabs (Oxycodone hcl) .Marland Kitchen.. 1 tab as needed  for pain 14)  Anusol Suppository  .... Qhs 15)  Combivent 18-103 Mcg/act Aero (Ipratropium-albuterol) .Marland Kitchen.. 1 puff bid 16)  Albuterol Nebulizer  .... Twice a day 17)  Ferrous Sulfate 325 (65 Fe) Mg Tabs (Ferrous sulfate) .... Take one tab by mouth two times a day 18)  Benazepril Hcl 10 Mg Tabs (Benazepril hcl) .Marland Kitchen.. 1 by mouth daily 19)  Dulera 200-5 Mcg/act Aero (Mometasone furo-formoterol fum) .... 2 puffs two times a day 20)  Mirtazapine 15 Mg Tabs (Mirtazapine) .Marland Kitchen.. 1 by mouth at bedtime  Other Orders: Influenza Vaccine MCR (16109) TLB-CBC Platelet - w/Differential (85025-CBCD) Prescription Created Electronically [phone])  Patient Instructions: 1)  follow up in 1 month 2)  STOP FLOVENT AND CHANGE TO THE DULERA, 2 PUFFS TWICE A DAY 3)  THE MIRTAZEPINE WE ARE STARTING TO TRY TO HELP WITH YOUR APPETITE Prescriptions: MIRTAZAPINE 15 MG TABS (MIRTAZAPINE) 1 by mouth at bedtime  #30 x 5   Entered and Authorized by:   Hannah Beat MD   Signed by:   Hannah Beat MD on 01/04/2010   Method used:   Print then Give to Patient   RxID:   [id number] OXYCODONE HCL 5 MG TABS (OXYCODONE HCL) 1 tab as needed for pain  #40 x 0   Entered and Authorized by:   Hannah Beat MD   Signed by:   Hannah Beat MD on 01/04/2010   Method used:   Print then Give to Patient   RxID:   [id number] KLOR-CON 20 MEQ PACK (POTASSIUM CHLORIDE) 1 tab by mouth daily  #90 x 3   Entered and Authorized by:   Hannah Beat MD   Signed by:   Hannah Beat MD on 01/04/2010   Method used:   Electronically to        [facility]* (retail)             ,          Ph: [phone]       Fax: [phone]   RxID:   [id number] PANTOPRAZOLE SODIUM 40 MG TBEC (PANTOPRAZOLE SODIUM) 1 tab once daily  #90 x 3   Entered and Authorized by:   Hannah Beat MD   Signed by:   Hannah Beat MD on 01/04/2010   Method used:   Electronically to        [facility]* (retail)             ,           Ph: [phone]       Fax: [phone]   RxID:   [id number] BENAZEPRIL HCL 10 MG TABS (BENAZEPRIL HCL) 1 by mouth daily  #90 x 3   Entered and Authorized by:  Hannah Beat MD   Signed by:   Hannah Beat MD on 01/04/2010   Method used:   Electronically to        [facility]* (retail)             ,          Ph: [phone]       Fax: [phone]   RxID:   [id number]   Current Allergies (reviewed today): ! PCN ! CODEINE ! DEMEROL ! MORPHINE ! DARVON ! SULFA ! * NIASPAN                                Immunizations Administered:  Influenza Vaccine # 1:    Vaccine Type: Fluvax MCR    Site: left deltoid    Mfr: GlaxoSmithKline    Dose: 0.5 ml    Given by: Benny Lennert CMA (AAMA)    Exp. Date: 09/29/2010    Lot #: [id number]    VIS given: 10/24/09 version given January 04, 2010.  Flu Vaccine Consent Questions:    Do you have a history of severe allergic reactions to this vaccine? no    Any prior history of allergic reactions to egg and/or gelatin? no    Do you have a sensitivity to the preservative Thimersol? no    Do you have a past history of Guillan-Barre Syndrome? no    Do you currently have an acute febrile illness? no    Have you ever had a severe reaction to latex? no    Vaccine information given and explained to patient? yes    Are you currently pregnant? no

## 2010-05-01 NOTE — Medication Information (Signed)
Summary: Tax adviser   Imported By: Kassie Mends 05/02/2009 10:59:38  _____________________________________________________________________  External Attachment:    Type:   Image     Comment:   External Document

## 2010-05-01 NOTE — Letter (Signed)
Summary: Patient [facility] Biopsy Results  [facility]  [address] [place], [place] [address]   Phone: [phone]  Fax: [phone]        November 29, 2009 MRN: [id number]    GENEA RHEAUME [address] [place], [place]  [address]    Dear Ms. Gilmer,  I am pleased to inform you that the biopsies taken during your recent endoscopic examination did not show any evidence of cancer upon pathologic examination.The biopsy from the small bowl was normal  Additional information/recommendations:  x_No further action is needed at this time.  Please follow-up with      your primary care physician for your other healthcare needs.  __ Please call [phone] to schedule a return visit to review      your condition.  _x_ Continue with the treatment plan as outlined on the day of your      exam.     Please call us if you are having persistent problems or have questions about your condition that have not been fully answered at this time.  Sincerely,  Hart Carwin MD  This letter has been electronically signed by your physician.  Appended Document: Patient Notice-Endo Biopsy Results letter mailed to patient's home

## 2010-05-02 ENCOUNTER — Other Ambulatory Visit: Payer: Self-pay | Admitting: Family Medicine

## 2010-05-02 ENCOUNTER — Encounter: Payer: Self-pay | Admitting: Family Medicine

## 2010-05-02 ENCOUNTER — Ambulatory Visit (INDEPENDENT_AMBULATORY_CARE_PROVIDER_SITE_OTHER): Payer: Medicare Other | Admitting: Family Medicine

## 2010-05-02 DIAGNOSIS — K279 Peptic ulcer, site unspecified, unspecified as acute or chronic, without hemorrhage or perforation: Secondary | ICD-10-CM

## 2010-05-02 DIAGNOSIS — I25810 Atherosclerosis of coronary artery bypass graft(s) without angina pectoris: Secondary | ICD-10-CM

## 2010-05-02 LAB — FERRITIN: Ferritin: 125.5 ng/mL (ref 10.0–291.0)

## 2010-05-02 LAB — CBC WITH DIFFERENTIAL/PLATELET
Basophils Absolute: 0 10*3/uL (ref 0.0–0.1)
Hemoglobin: 11.3 g/dL — ABNORMAL LOW (ref 12.0–15.0)
Lymphocytes Relative: 23 % (ref 12.0–46.0)
Monocytes Relative: 5.6 % (ref 3.0–12.0)
Neutro Abs: 3.7 10*3/uL (ref 1.4–7.7)
Neutrophils Relative %: 68.6 % (ref 43.0–77.0)
RDW: 15.5 % — ABNORMAL HIGH (ref 11.5–14.6)

## 2010-05-03 NOTE — Op Note (Signed)
Summary: Right Breast Biopsy,[facility]  Right Breast Biopsy,[facility]   Imported By: Beau Fanny 04/26/2010 15:19:30  _____________________________________________________________________  External Attachment:    Type:   Image     Comment:   External Document

## 2010-05-03 NOTE — Assessment & Plan Note (Signed)
Summary: FOLLOW-UP 6 WEEK/JRR   Vital Signs:  Patient profile:   77 year old female Height:      62 inches Weight:      168.50 pounds BMI:     30.93 Temp:     98.0 degrees F oral Pulse rate:   72 / minute Pulse rhythm:   regular BP sitting:   110 / 70  (left arm) Cuff size:   large  Vitals Entered By: Benny Lennert CMA Duncan Dull) (April 09, 2010 9:37 AM)  History of Present Illness: Chief complaint 6 week follow up multiple medical problems  COPD: continues to get short of breath, wearing 2 L oxygen at baseline, using dulera.   ARF: improved last time checked  Anemia: s/p distant gi bleed, on iron still  failure to thrive: Has no appetite, does not get hungry at all. Does not really want to eat anything.  minimal by mouth input, does not want to eat, some nausea, will not drink boost or ensure.  ************************************  Most recent OV: 77 your old patient very well known to me, with a history of multiple probs recently. Recent hospitalization was CHF of his exacerbation, increase Lasix requirements, then stabilized. Reviewed Dr. Regino Schultze notes from yesterday.  COPD: She is on oxygen baseline, nor do i. There some financial limitations, so I have not been able to start her on Spiriva or Combivent. We are going to attempt to make a social work consult for attempt to get any assistance that we can.  Cardiac. CHF, atrial fibrillation. Status post CABG. Before this entirely to cardiology. She is going to follow with Dr. Excell Seltzer in the future. She also does have some severe peripheral vascular disease.  Anemia: Status post GI bleed, with iron deficiency anemia. The patient stopped her iron, and not entirely clear why.  creatinine is as stabilized, status post acute renal failure  She has no steps at home. She is using a wheel rolling walker, and is able to move around the house and do cleaning. She doesn't do any cooking, which her husband  does.  *************************************************************************************************  Review of systems: Continues to have some shortness of breath, limitation with walking, but can get around the house. She continues to have some edema. This is improved compared to prior examinations. Generally feels better compared to prior area.   Current Problems (verified): 1)  Anorexia  (ICD-783.0) 2)  COPD  (ICD-496) 3)  Renal Insufficiency  (ICD-588.9) 4)  Rectal Bleeding  (ICD-569.3) 5)  Chronic Systolic Heart Failure  (ICD-428.22) 6)  Encounter For Long-term Use of Other Medications  (ICD-V58.69) 7)  Migraine Without Aura  (ICD-346.10) 8)  Renal Artery Stenosis  (ICD-440.1) 9)  Atrial Fibrillation  (ICD-427.31) 10)  Cad, Autologous Bypass Graft  (ICD-414.02) 11)  Diastolic Heart Failure, Chronic  (ICD-428.32) 12)  Compression Fracture, L1 Vertebra  (ICD-805.4) 13)  Degenerative Disc Disease  (ICD-722.6) 14)  Polyneuropathy in Diabetes  (ICD-357.2) 15)  Depression  (ICD-311) 16)  Gout, Acute  (ICD-274.9) 17)  Diverticulitis  (ICD-562.11) 18)  Carotid Artery Stenosis (MILD) Both 12/05  (ICD-433.10) 19)  Glaucoma (DR. GROAT)  (ICD-365.9) 20)  Pulmonary Embolism, Hx of  (ICD-V12.51) 21)  Peripheral Vascular Disease  (ICD-443.9) 22)  Peptic Ulcer Disease  (ICD-533.90) 23)  Osteopenia  (ICD-733.90) 24)  Hypertension  (ICD-401.9) 25)  Hyperlipidemia  (ICD-272.4) 26)  Dvt, Hx of  (ICD-V12.51) 27)  Diabetes Mellitus, Type II  (ICD-250.00)  Allergies: 1)  ! Pcn 2)  ! Codeine 3)  !  Demerol 4)  ! Morphine 5)  ! Darvon 6)  ! Sulfa 7)  ! * Niaspan  Past History:  Past medical, surgical, family and social histories (including risk factors) reviewed, and no changes noted (except as noted below).  Past Medical History: Reviewed history from 01/04/2010 and no changes required. Diabetes mellitus, type II 8/05 COPD, on oxygen Diverticulosis, colon inflamed hemorrhoids  03/13/06 Osteopenia 09/1999, Dexa 8/05 Osteopenia Peptic ulcer disease Peripheral vascular disease 1. Diastolic heart failure 2. Coronary artery disease, status post prior coronary bypass graft     surgery with  patent grafts  2010 3. Renovascular hypertension. 4. Renal artery stenosis, status post recent renal artery stenting on     the left. 5. History of paroxysmal atrial fibrillation, not a Coumadin     candidate. 6. History of deep vein thrombophlebitis and pulmonary embolism,     status post IV filter placement. 7. History of gastrointestinal bleeding. 8. Diabetes. 9. Hypertension. 10. S/P AAA repair 10.Hyperlipidemia.  11. gastric ersions       . Marland Kitchen  Past Surgical History: Reviewed history from 01/04/2010 and no changes required. 11/2009: ARF, Cr to 7, Resp failure, COPD, intubated in ICU. Appendectomy Coronary artery bypass graft 09/04/01 Cataract extraction Both eyes with IOL 2001 Cholecystectomy Hysterectomy (total) Carotid U/S unchanged 40-50% ICA stenosis--12/1998 Myoview Adeno. Neg EF 76%--06/26/2004 Carotid doppler 12/05, unchanged 9/06 Cardiac cath 09/03/01 Renal U/S Unsatisfactory study 8/01 AAA Repair 1997 EGD Abn. 12/07--esophageal stricture 1/09--06/30/07--gastritis and major bleed Colonoscopy Abn. 03/13/06 EMG--06/17/07--severd  DM neuropathy and radiculopathy---[facility]--06/27/2008--feet, abnormal placement of inferior vena cava filter --07/03/07--[facility] blood transfusions x6 with GI bleed 3/27-07/03/07 stent to R kidney 8/09 renal artery doppler--11/17/2008--severe L common iliac stenosis  Family History: Reviewed history from 11/27/2009 and no changes required.  according last visit. Mother deceased from cancer.  Father deceased from a  CVA.  She had one brother who died from CVA and 3 brothers who died from  MI.   Social History: Reviewed history from 11/27/2009 and no changes required. She lives in [place].  She is married.  She quit   smoking in 1992, but she has a former history of 40-pack years.  Negative for EtOH.  Negative for drug use.  She uses a walker in the  nursing home for ambulation.      Physical Exam  General:  Well-developed,well-nourished,in no acute distress; alert,appropriate and cooperative throughout examination, hypertensive. Head:  Normocephalic and atraumatic without obvious abnormalities. No apparent alopecia or balding. Ears:  no external deformities.   Nose:  no external deformity.   Lungs:  Poor air movement diffusely but no crackles and air movement is equal.  Air sounds are equal.   No wheezes Heart:  Normal rate and regular rhythm. S1 and S2 normal without gallop, 2/6 sys murmur,no click, rub or other extra sounds.  Extremities:  trace bilateral edema Neurologic:  alert & oriented X3.   Cervical Nodes:  No lymphadenopathy noted Psych:  Cognition and judgment appear intact. Alert and cooperative with normal attention span and concentration. No apparent delusions, illusions, hallucinations   Impression & Recommendations:  Problem # 1:  ANOREXIA (ICD-783.0) Assessment New Most concerning - will not eat, and i think this is crucial to her improvement long term. I am placing on low dose Marinol 2.5 mg two times a day to try to stimulate appetite.  Problem # 2:  COPD (ICD-496) Assessment: Unchanged poor control add spiriva cont o2  Her updated medication list for this problem includes:  Dulera 100-5 Mcg/act Aero (Mometasone furo-formoterol fum) .Marland Kitchen... 1 puff two times a day    Spiriva Handihaler 18 Mcg Caps (Tiotropium bromide monohydrate) .Marland Kitchen... 1 capsule inhaled daily  Problem # 3:  RENAL INSUFFICIENCY (ICD-588.9) Assessment: Unchanged  Orders: TLB-BMP (Basic Metabolic Panel-BMET) (80048-METABOL)  Problem # 4:  CHRONIC SYSTOLIC HEART FAILURE (ICD-428.22)  Her updated medication list for this problem includes:    Furosemide 80 Mg Tabs (Furosemide) .Marland Kitchen... 1 tab two times a day     Benazepril Hcl 10 Mg Tabs (Benazepril hcl) .Marland Kitchen... Take 1 tablet by mouth once a day  Complete Medication List: 1)  Furosemide 80 Mg Tabs (Furosemide) .Marland Kitchen.. 1 tab two times a day 2)  Vytorin 10-20 Mg Tabs (Ezetimibe-simvastatin) .... Take one tablet by mouth daily at bedtime 3)  Norvasc 10 Mg Tabs (Amlodipine besylate) .... Take one tab by mouth once daily 4)  Isosorbide Mononitrate Cr 30 Mg Xr24h-tab (Isosorbide mononitrate) .... Take 1 tablet by mouth once a day 5)  Xalatan 0.005 % Soln (Latanoprost) .... One drop each eye daily 6)  Nitrostat 0.4 Mg Subl (Nitroglycerin) .Marland Kitchen.. 1 tablet under tongue at onset of chest pain; you may repeat every 5 minutes for up to 3 doses. 7)  Pantoprazole Sodium 40 Mg Tbec (Pantoprazole sodium) .Marland Kitchen.. 1 tab once daily 8)  Klor-con 10 10 Meq Cr-tabs (Potassium chloride) .... Take 1/2  tablet daily 9)  Ketorolac Ophthalimic Solution 0.4 %  .... Rt eye 4 times a day 10)  Oxycodone Hcl 5 Mg Tabs (Oxycodone hcl) .Marland Kitchen.. 1 tab as needed for pain 11)  Benazepril Hcl 10 Mg Tabs (Benazepril hcl) .... Take 1 tablet by mouth once a day 12)  Mirtazapine 15 Mg Tabs (Mirtazapine) .Marland Kitchen.. 1 by mouth at bedtime 13)  Dulera 100-5 Mcg/act Aero (Mometasone furo-formoterol fum) .Marland Kitchen.. 1 puff two times a day 14)  Oxigen 2 Litters  15)  Ferrous Sulfate 325 (65 Fe) Mg Tbec (Ferrous sulfate) .Marland Kitchen.. 1 by mouth two times a day 16)  Spiriva Handihaler 18 Mcg Caps (Tiotropium bromide monohydrate) .Marland Kitchen.. 1 capsule inhaled daily 17)  Dronabinol 2.5 Mg Caps (Dronabinol) .Marland Kitchen.. 1 by mouth two times a day  Other Orders: Venipuncture (16109) TLB-Lipid Panel (80061-LIPID) TLB-CBC Platelet - w/Differential (85025-CBCD) TLB-Hepatic/Liver Function Pnl (80076-HEPATIC) TLB-A1C / Hgb A1C (Glycohemoglobin) (83036-A1C)  Patient Instructions: 1)  recheck 2 months Prescriptions: DRONABINOL 2.5 MG CAPS (DRONABINOL) 1 by mouth two times a day  #60 x 2   Entered and Authorized by:   Hannah Beat MD   Signed by:    Hannah Beat MD on 04/09/2010   Method used:   Print then Give to Patient   RxID:   [id number] SPIRIVA HANDIHALER 18 MCG CAPS (TIOTROPIUM BROMIDE MONOHYDRATE) 1 capsule inhaled daily  #1 x 2   Entered and Authorized by:   Hannah Beat MD   Signed by:   Hannah Beat MD on 04/09/2010   Method used:   Electronically to        [id number] (retail)       [address]       [place], [place]  [address]       Ph: [phone] or [phone]       Fax: [phone]   RxID:   [id number]    Orders Added: 1)  Venipuncture [33295] 2)  TLB-Lipid Panel [80061-LIPID] 3)  TLB-BMP (Basic Metabolic Panel-BMET) [80048-METABOL] 4)  TLB-CBC Platelet - w/Differential [85025-CBCD] 5)  TLB-Hepatic/Liver Function Pnl [80076-HEPATIC] 6)  TLB-A1C /  Hgb A1C (Glycohemoglobin) [83036-A1C] 7)  Est. Patient Level IV [91478]    Current Allergies (reviewed today): ! PCN ! CODEINE ! DEMEROL ! MORPHINE ! DARVON ! SULFA ! * NIASPAN

## 2010-05-03 NOTE — Miscellaneous (Signed)
  Clinical Lists Changes  Observations: Added new observation of DMEYEEXAMNXT: 03/09/2011 (03/23/2010 9:37) Added new observation of DBT EY CK DT: 03/08/2010 (03/08/2010 9:38) Added new observation of DIAB EYE EX: normal (03/08/2010 9:38)     Diabetic Eye Exam Date:  03/08/2010 Diabetes Eye Exam Result:  normal Diabetes Eye Exam Due:  1 yr

## 2010-05-03 NOTE — Medication Information (Signed)
Summary: Rx Clarification for Klor-Con  Rx Clarification for Klor-Con   Imported By: Maryln Gottron 04/04/2010 09:15:06  _____________________________________________________________________  External Attachment:    Type:   Image     Comment:   External Document

## 2010-05-03 NOTE — Progress Notes (Signed)
Summary: clarification on klor con script  Phone Note From Pharmacy   Caller: [facility]* Summary of Call: Form is on your desk asking for clarification on klor con script.  Directions are different from what is in the chart. Initial call taken by: Lowella Petties CMA, AAMA,  March 22, 2010 9:07 AM  Follow-up for Phone Call        completed Follow-up by: Hannah Beat MD,  March 22, 2010 9:43 AM

## 2010-05-03 NOTE — Letter (Signed)
Summary: [facility]  [facility]   Imported By: Lester  03/23/2010 09:17:35  _____________________________________________________________________  External Attachment:    Type:   Image     Comment:   External Document  Appended Document: [facility] update eye exam in centricity  Appended Document: [facility] done.[name] CMA

## 2010-05-09 NOTE — Assessment & Plan Note (Signed)
Summary: ULCER/[facility]   Vital Signs:  Patient profile:   77 year old female Height:      62 inches Weight:      166.50 pounds BMI:     30.56 Temp:     98.4 degrees F oral Pulse rate:   72 / minute Pulse rhythm:   regular BP sitting:   140 / 60  (right arm) Cuff size:   large  Vitals Entered By: Benny Lennert CMA Duncan Dull) (May 02, 2010 10:51 AM)  Primary Provider:  Hannah Beat MD   History of Present Illness: Chief complaint problems with ulcers  77 year old female:  pleasant patient who I know well, was hospitalized in August, 2011 for acute GI bleed who presents with worsening abdominal pain, pain and burning sensation when she is eating. Difficulty eating. She is also had some small amount of bright red blood per rectum and occasionally when she vomited she will have some coffee-ground emesis. She has known ulcer, and recently had an EGD during her hospitalization in August 2011.  She's had multiple hospitalizations, very difficult medical course of the last several months, acute COPD, acute renal failure, and generally she has done well, but she has been off of her PPI.  When eating, stomache is hurting, burns like it is on fire.  Hurts sometimes - not resting well.   Current Problems (verified): 1)  COPD  (ICD-496) 2)  Renal Insufficiency  (ICD-588.9) 3)  Chronic Systolic Heart Failure  (ICD-428.22) 4)  Encounter For Long-term Use of Other Medications  (ICD-V58.69) 5)  Migraine Without Aura  (ICD-346.10) 6)  Renal Artery Stenosis  (ICD-440.1) 7)  Atrial Fibrillation  (ICD-427.31) 8)  Cad, Autologous Bypass Graft  (ICD-414.02) 9)  Diastolic Heart Failure, Chronic  (ICD-428.32) 10)  Degenerative Disc Disease  (ICD-722.6) 11)  Polyneuropathy in Diabetes  (ICD-357.2) 12)  Depression  (ICD-311) 13)  Gout, Acute  (ICD-274.9) 14)  Diverticulitis  (ICD-562.11) 15)  Carotid Artery Stenosis (MILD) Both 12/05  (ICD-433.10) 16)  Glaucoma (DR. GROAT)  (ICD-365.9) 17)   Pulmonary Embolism, Hx of  (ICD-V12.51) 18)  Peripheral Vascular Disease  (ICD-443.9) 19)  Peptic Ulcer Disease  (ICD-533.90) 20)  Osteopenia  (ICD-733.90) 21)  Hypertension  (ICD-401.9) 22)  Hyperlipidemia  (ICD-272.4) 23)  Dvt, Hx of  (ICD-V12.51) 24)  Diabetes Mellitus, Type II  (ICD-250.00)  Allergies: 1)  ! Pcn 2)  ! Codeine 3)  ! Demerol 4)  ! Morphine 5)  ! Darvon 6)  ! Sulfa 7)  ! * Niaspan  Past History:  Past medical, surgical, family and social histories (including risk factors) reviewed, and no changes noted (except as noted below).  Past Medical History: Reviewed history from 01/04/2010 and no changes required. Diabetes mellitus, type II 8/05 COPD, on oxygen Diverticulosis, colon inflamed hemorrhoids 03/13/06 Osteopenia 09/1999, Dexa 8/05 Osteopenia Peptic ulcer disease Peripheral vascular disease 1. Diastolic heart failure 2. Coronary artery disease, status post prior coronary bypass graft     surgery with  patent grafts  2010 3. Renovascular hypertension. 4. Renal artery stenosis, status post recent renal artery stenting on     the left. 5. History of paroxysmal atrial fibrillation, not a Coumadin     candidate. 6. History of deep vein thrombophlebitis and pulmonary embolism,     status post IV filter placement. 7. History of gastrointestinal bleeding. 8. Diabetes. 9. Hypertension. 10. S/P AAA repair 10.Hyperlipidemia.  11. gastric ersions       . Marland Kitchen  Past Surgical  History: Reviewed history from 01/04/2010 and no changes required. 11/2009: ARF, Cr to 7, Resp failure, COPD, intubated in ICU. Appendectomy Coronary artery bypass graft 09/04/01 Cataract extraction Both eyes with IOL 2001 Cholecystectomy Hysterectomy (total) Carotid U/S unchanged 40-50% ICA stenosis--12/1998 Myoview [place]. Neg EF 76%--06/26/2004 Carotid doppler 12/05, unchanged 9/06 Cardiac cath 09/03/01 Renal U/S Unsatisfactory study 8/01 AAA Repair 1997 EGD Abn. 12/07--esophageal  stricture 1/09--06/30/07--gastritis and major bleed Colonoscopy Abn. 03/13/06 EMG--06/17/07--severd  DM neuropathy and radiculopathy---[facility]--06/27/2008--feet, abnormal placement of inferior vena cava filter --07/03/07--[facility] blood transfusions x6 with GI bleed 3/27-07/03/07 stent to R kidney 8/09 renal artery doppler--11/17/2008--severe L common iliac stenosis  Family History: Reviewed history from 11/27/2009 and no changes required.  according last visit. Mother deceased from cancer.  Father deceased from a  CVA.  She had one brother who died from CVA and 3 brothers who died from  MI.   Social History: Reviewed history from 11/27/2009 and no changes required. She lives in [place].  She is married.  She quit  smoking in 1992, but she has a former history of 40-pack years.  Negative for EtOH.  Negative for drug use.  She uses a walker in the  nursing home for ambulation.      Review of Systems      See HPI General:  Complains of fatigue; denies chills and fever. GI:  See HPI; Complains of abdominal pain, bloody stools, and dark tarry stools; denies constipation.  Physical Exam  General:  Well-developed,well-nourished,in no acute distress; alert,appropriate and cooperative throughout examination, hypertensive. Head:  Normocephalic and atraumatic without obvious abnormalities. No apparent alopecia or balding. Ears:  no external deformities.   Nose:  no external deformity.   Lungs:  Normal respiratory effort, chest expands symmetrically. Lungs are clear to auscultation, no crackles or wheezes. Heart:  Normal rate and regular rhythm. S1 and S2 normal without gallop, 2/6 sys murmur,no click, rub or other extra sounds.  Abdomen:  mild diffuse abd pain without rebound, normal BS, no distension, no masses, no rigidity, no guarding.  Extremities:  trace bilateral edema Psych:  Cognition and judgment appear intact. Alert and cooperative with normal attention span and concentration.  No apparent delusions, illusions, hallucinations   Impression & Recommendations:  Problem # 1:  PEPTIC ULCER DISEASE (ICD-533.90) Assessment Deteriorated c/w acute worsening with known prior history of ulcer PPI high dose - will need to be on indefinitely - recheck 1 mo  Her updated medication list for this problem includes:    Omeprazole 40 Mg Cpdr (Omeprazole) .Marland Kitchen... 1 by mouth 30 minutes before breakfast  Orders: TLB-CBC Platelet - w/Differential (85025-CBCD) TLB-Ferritin (82728-FER) Venipuncture (09811)  Problem # 2:  CAD, AUTOLOGOUS BYPASS GRAFT (ICD-414.02) Assessment: Unchanged WIth her GI bleeds, hospitalizations, the patient was taken off Plavix and ASA and is not taking any longer. I am not clear about the current recommendations for long-term use in this particular patient. Currently with active ulcer, these would be contraindicated. I will ask Dr. Excell Seltzer if at any point, does this patient need anti-platelet therapy? Poses risk from a GI standpoint -- certainly currently.  Her updated medication list for this problem includes:    Norvasc 10 Mg Tabs (Amlodipine besylate) .Marland Kitchen... Take one tab by mouth once daily    Isosorbide Mononitrate Cr 30 Mg Xr24h-tab (Isosorbide mononitrate) .Marland Kitchen... Take 1 tablet by mouth once a day    Nitrostat 0.4 Mg Subl (Nitroglycerin) .Marland Kitchen... 1 tablet under tongue at onset of chest pain; you may repeat every  5 minutes for up to 3 doses.    Metoprolol Succinate 25 Mg Xr24h-tab (Metoprolol succinate) .Marland Kitchen... Take one tablet by mouth daily  Complete Medication List: 1)  Furosemide 80 Mg Tabs (Furosemide) .Marland Kitchen.. 1 tab two times a day 2)  Vytorin 10-20 Mg Tabs (Ezetimibe-simvastatin) .... Take one tablet by mouth daily at bedtime 3)  Norvasc 10 Mg Tabs (Amlodipine besylate) .... Take one tab by mouth once daily 4)  Isosorbide Mononitrate Cr 30 Mg Xr24h-tab (Isosorbide mononitrate) .... Take 1 tablet by mouth once a day 5)  Xalatan 0.005 % Soln (Latanoprost) ....  One drop each eye daily 6)  Nitrostat 0.4 Mg Subl (Nitroglycerin) .Marland Kitchen.. 1 tablet under tongue at onset of chest pain; you may repeat every 5 minutes for up to 3 doses. 7)  Klor-con 10 10 Meq Cr-tabs (Potassium chloride) .... Take 1 tablet by mouth once a day 8)  Ketorolac Ophthalimic Solution 0.4 %  .... Rt eye 4 times a day 9)  Oxycodone Hcl 5 Mg Tabs (Oxycodone hcl) .Marland Kitchen.. 1 tab as needed for pain 10)  Mirtazapine 15 Mg Tabs (Mirtazapine) .Marland Kitchen.. 1 by mouth at bedtime 11)  Dulera 100-5 Mcg/act Aero (Mometasone furo-formoterol fum) .Marland Kitchen.. 1 puff two times a day 12)  Oxigen 2 Litters  13)  Ferrous Sulfate 325 (65 Fe) Mg Tbec (Ferrous sulfate) .... Take 1 tablet by mouth once a day 14)  Spiriva Handihaler 18 Mcg Caps (Tiotropium bromide monohydrate) .Marland Kitchen.. 1 capsule inhaled daily 15)  Metoprolol Succinate 25 Mg Xr24h-tab (Metoprolol succinate) .... Take one tablet by mouth daily 16)  Losartan Potassium 50 Mg Tabs (Losartan potassium) .... Take 1 tablet by mouth two times a day 17)  Omeprazole 40 Mg Cpdr (Omeprazole) .Marland Kitchen.. 1 by mouth 30 minutes before breakfast  Other Orders: Est. Patient Level IV (16109)  Patient Instructions: 1)  f/u 1 month Prescriptions: OMEPRAZOLE 40 MG CPDR (OMEPRAZOLE) 1 by mouth 30 minutes before breakfast  #90 x 3   Entered and Authorized by:   Hannah Beat MD   Signed by:   Hannah Beat MD on 05/02/2010   Method used:   Electronically to        [facility] [id number] (retail)       [address]       [place], [place]  [address]       Ph: [phone] or [phone]       Fax: [phone]   RxID:   [id number]    Orders Added: 1)  TLB-CBC Platelet - w/Differential [85025-CBCD] 2)  TLB-Ferritin [82728-FER] 3)  Venipuncture [03474] 4)  Est. Patient Level IV [25956]    Current Allergies (reviewed today): ! PCN ! CODEINE ! DEMEROL ! MORPHINE ! DARVON ! SULFA ! * NIASPAN

## 2010-05-17 NOTE — Assessment & Plan Note (Signed)
Summary: 2 month rov    Visit Type:  f- Former pt. of Dr. Juanda Chance Primary Provider:  Hannah Beat MD  CC:  Some chest pains and Sob.  History of Present Illness: [name] is a delightful 77 year-old woman with a complex medical history. She has CAD s/p CABG and extensive PAD. She has undergone aortobifemoral bypass and renal stenting.   Overall she is doing well at present. She has had recurrent GI bleeding and has undergone IVC filter placement for recurrent DVT. She is now off of warfarin and all antiplatelet Rx.  The patient denies chest pain or dyspnea. She is physically limited and is not very active.   Current Medications (verified): 1)  Furosemide 80 Mg Tabs (Furosemide) .Marland Kitchen.. 1 Tab Two Times A Day 2)  Vytorin 10-20 Mg Tabs (Ezetimibe-Simvastatin) .... Take One Tablet By Mouth Daily At Bedtime 3)  Norvasc 10 Mg Tabs (Amlodipine Besylate) .... Take One Tab By Mouth Once Daily 4)  Isosorbide Mononitrate Cr 30 Mg Xr24h-Tab (Isosorbide Mononitrate) .... Take 1 Tablet By Mouth Once A Day 5)  Xalatan 0.005 % Soln (Latanoprost) .... One Drop Each Eye Daily 6)  Nitrostat 0.4 Mg Subl (Nitroglycerin) .Marland Kitchen.. 1 Tablet Under Tongue At Onset of Chest Pain; You May Repeat Every 5 Minutes For Up To 3 Doses. 7)  Klor-Con 10 10 Meq Cr-Tabs (Potassium Chloride) .... Take 1 Tablet By Mouth Once A Day 8)  Ketorolac Ophthalimic Solution 0.4 % .... Rt Eye 4 Times A Day 9)  Oxycodone Hcl 5 Mg Tabs (Oxycodone Hcl) .Marland Kitchen.. 1 Tab As Needed For Pain 10)  Benazepril Hcl 10 Mg Tabs (Benazepril Hcl) .... Take 1 Tablet By Mouth Once A Day 11)  Mirtazapine 15 Mg Tabs (Mirtazapine) .Marland Kitchen.. 1 By Mouth At Bedtime 12)  Dulera 100-5 Mcg/act Aero (Mometasone Furo-Formoterol Fum) .Marland Kitchen.. 1 Puff Two Times A Day 13)  Oxigen 2 Litters 14)  Ferrous Sulfate 325 (65 Fe) Mg Tbec (Ferrous Sulfate) .... Take 1 Tablet By Mouth Once A Day 15)  Spiriva Handihaler 18 Mcg Caps (Tiotropium Bromide Monohydrate) .Marland Kitchen.. 1 Capsule Inhaled  Daily 16)  Metoprolol Succinate 25 Mg Xr24h-Tab (Metoprolol Succinate) .... Take One Tablet By Mouth Daily 17)  Losartan Potassium 50 Mg Tabs (Losartan Potassium) .... Take 1 Tablet By Mouth Two Times A Day  Allergies: 1)  ! Pcn 2)  ! Codeine 3)  ! Demerol 4)  ! Morphine 5)  ! Darvon 6)  ! Sulfa 7)  ! * Niaspan  Past History:  Past medical history reviewed for relevance to current acute and chronic problems.  Past Medical History: Reviewed history from 01/04/2010 and no changes required. Diabetes mellitus, type II 8/05 COPD, on oxygen Diverticulosis, colon inflamed hemorrhoids 03/13/06 Osteopenia 09/1999, Dexa 8/05 Osteopenia Peptic ulcer disease Peripheral vascular disease 1. Diastolic heart failure 2. Coronary artery disease, status post prior coronary bypass graft     surgery with  patent grafts  2010 3. Renovascular hypertension. 4. Renal artery stenosis, status post recent renal artery stenting on     the left. 5. History of paroxysmal atrial fibrillation, not a Coumadin     candidate. 6. History of deep vein thrombophlebitis and pulmonary embolism,     status post IV filter placement. 7. History of gastrointestinal bleeding. 8. Diabetes. 9. Hypertension. 10. S/P AAA repair 10.Hyperlipidemia.  11. gastric ersions       . Marland Kitchen  Review of Systems       Negative except as per HPI  Vital Signs:  Patient profile:   77 year old female Height:      62 inches Weight:      170 pounds BMI:     31.21 Pulse rate:   59 / minute Pulse rhythm:   regular Resp:     20 per minute BP sitting:   140 / 56  (left arm) Cuff size:   large  Vitals Entered By: Vikki Ports (April 26, 2010 8:50 AM)  Physical Exam  General:  Pt is alert and oriented, elderly woman, chronically-ill appearing in no acute distress. HEENT: normal Neck: normal carotid upstrokes without bruits, JVP normal Lungs: CTA CV: RRR without murmur or gallop Abd: soft, NT, positive BS, no bruit, no  organomegaly Ext: no clubbing, cyanosis, or edema.  Skin: warm and dry without rash    EKG  Procedure date:  04/26/2010  Findings:      Sinus bradycardia, anteroseptal infarct age undetermined, HR 59 bpm.  Impression & Recommendations:  Problem # 1:  CHRONIC SYSTOLIC HEART FAILURE (ICD-428.22) Pt appears euvolemic today by exam. Her extensive med list was reviewed. She is on both losartan and benazepril, so I have asked her to discontinue benazepril. She will remain on her other meds as listed below.  The following medications were removed from the medication list:    Benazepril Hcl 10 Mg Tabs (Benazepril hcl) .Marland Kitchen... Take 1 tablet by mouth once a day Her updated medication list for this problem includes:    Furosemide 80 Mg Tabs (Furosemide) .Marland Kitchen... 1 tab two times a day    Norvasc 10 Mg Tabs (Amlodipine besylate) .Marland Kitchen... Take one tab by mouth once daily    Isosorbide Mononitrate Cr 30 Mg Xr24h-tab (Isosorbide mononitrate) .Marland Kitchen... Take 1 tablet by mouth once a day    Nitrostat 0.4 Mg Subl (Nitroglycerin) .Marland Kitchen... 1 tablet under tongue at onset of chest pain; you may repeat every 5 minutes for up to 3 doses.    Metoprolol Succinate 25 Mg Xr24h-tab (Metoprolol succinate) .Marland Kitchen... Take one tablet by mouth daily    Losartan Potassium 50 Mg Tabs (Losartan potassium) .Marland Kitchen... Take 1 tablet by mouth two times a day  Problem # 2:  RENAL ARTERY STENOSIS (ICD-440.1) BP reasonably controlled. Renal duplex August 2011 reviewed and showed normal bilateral kidney size. Continue clinical followup.  Problem # 3:  CAD, AUTOLOGOUS BYPASS GRAFT (ICD-414.02) Stable without angina. No longer a candidate for antiplatelet or antithrombotic therapy because of GI bleeding.  The following medications were removed from the medication list:    Benazepril Hcl 10 Mg Tabs (Benazepril hcl) .Marland Kitchen... Take 1 tablet by mouth once a day Her updated medication list for this problem includes:    Norvasc 10 Mg Tabs (Amlodipine besylate)  .Marland Kitchen... Take one tab by mouth once daily    Isosorbide Mononitrate Cr 30 Mg Xr24h-tab (Isosorbide mononitrate) .Marland Kitchen... Take 1 tablet by mouth once a day    Nitrostat 0.4 Mg Subl (Nitroglycerin) .Marland Kitchen... 1 tablet under tongue at onset of chest pain; you may repeat every 5 minutes for up to 3 doses.    Metoprolol Succinate 25 Mg Xr24h-tab (Metoprolol succinate) .Marland Kitchen... Take one tablet by mouth daily  Orders: EKG w/ Interpretation (93000)  Problem # 4:  HYPERTENSION (ICD-401.9) Pt also with history of orthostasis. BP well-controlled at present and no recent dizziness or syncope. Continue current meds with exception of benazepril.  The following medications were removed from the medication list:    Benazepril Hcl 10 Mg Tabs (Benazepril hcl) .Marland KitchenMarland KitchenMarland KitchenMarland Kitchen  Take 1 tablet by mouth once a day Her updated medication list for this problem includes:    Furosemide 80 Mg Tabs (Furosemide) .Marland Kitchen... 1 tab two times a day    Norvasc 10 Mg Tabs (Amlodipine besylate) .Marland Kitchen... Take one tab by mouth once daily    Metoprolol Succinate 25 Mg Xr24h-tab (Metoprolol succinate) .Marland Kitchen... Take one tablet by mouth daily    Losartan Potassium 50 Mg Tabs (Losartan potassium) .Marland Kitchen... Take 1 tablet by mouth two times a day  BP today: 140/56 Prior BP: 110/70 (04/09/2010)  Labs Reviewed: K+: 5.0 (04/09/2010) Creat: : 1.6 (04/09/2010)   Chol: 145 (04/09/2010)   HDL: 39.50 (04/09/2010)   LDL: 81 (04/09/2010)   TG: 125.0 (04/09/2010)  Patient Instructions: 1)  Your physician wants you to follow-up in: 4 MONTHS.  You will receive a reminder letter in the mail two months in advance. If you don't receive a letter, please call our office to schedule the follow-up appointment. 2)  Your physician has recommended you make the following change in your medication: STOP Benazepril     EKG  Procedure date:  04/26/2010  Findings:      Sinus bradycardia, anteroseptal infarct age undetermined, HR 59 bpm.

## 2010-05-31 ENCOUNTER — Ambulatory Visit (INDEPENDENT_AMBULATORY_CARE_PROVIDER_SITE_OTHER): Payer: Medicare Other | Admitting: Family Medicine

## 2010-05-31 ENCOUNTER — Encounter: Payer: Self-pay | Admitting: Family Medicine

## 2010-05-31 DIAGNOSIS — J449 Chronic obstructive pulmonary disease, unspecified: Secondary | ICD-10-CM

## 2010-05-31 DIAGNOSIS — I10 Essential (primary) hypertension: Secondary | ICD-10-CM

## 2010-05-31 DIAGNOSIS — K279 Peptic ulcer, site unspecified, unspecified as acute or chronic, without hemorrhage or perforation: Secondary | ICD-10-CM

## 2010-06-07 NOTE — Assessment & Plan Note (Signed)
Summary: 1 month f/u jrt   Vital Signs:  Patient profile:   77 year old female Height:      62 inches Weight:      174.25 pounds BMI:     31.99 Temp:     98.1 degrees F oral Pulse rate:   72 / minute Pulse rhythm:   regular BP sitting:   160 / 52  (left arm) Cuff size:   large  Vitals Entered By: Benny Lennert CMA Duncan Dull) (May 31, 2010 9:36 AM)  History of Present Illness: Chief complaint 1 month follow up stomach issues  77 year old female:  f/u probable ulcer: doing much better.  Effectively no abd pain now after being on Prilosec for 1 mo h/o ulcer and h/o GI bleeds, multiple in the past.  HTN: 160/60 elevated BP "has been running high" 140's-160 at home compliant  COPD: on dulera, spiriva, oxygen breathing much better Able to clean around the house, do all of her chores without any difficulty. Would like to stop her inhalers. s/p hospitalization with resp decompensation.  Resp failure 11/2009 CHF exac, 11/2009 and 01/2010 hosp.  Doing much better now.  Allergies: 1)  ! Pcn 2)  ! Codeine 3)  ! Demerol 4)  ! Morphine 5)  ! Darvon 6)  ! Sulfa 7)  ! * Niaspan  Past History:  Past medical, surgical, family and social histories (including risk factors) reviewed, and no changes noted (except as noted below).  Past Medical History: Reviewed history from 01/04/2010 and no changes required. Diabetes mellitus, type II 8/05 COPD, on oxygen Diverticulosis, colon inflamed hemorrhoids 03/13/06 Osteopenia 09/1999, Dexa 8/05 Osteopenia Peptic ulcer disease Peripheral vascular disease 1. Diastolic heart failure 2. Coronary artery disease, status post prior coronary bypass graft     surgery with  patent grafts  2010 3. Renovascular hypertension. 4. Renal artery stenosis, status post recent renal artery stenting on     the left. 5. History of paroxysmal atrial fibrillation, not a Coumadin     candidate. 6. History of deep vein thrombophlebitis and pulmonary  embolism,     status post IV filter placement. 7. History of gastrointestinal bleeding. 8. Diabetes. 9. Hypertension. 10. S/P AAA repair 10.Hyperlipidemia.  11. gastric ersions       . Marland Kitchen  Past Surgical History: Reviewed history from 01/04/2010 and no changes required. 11/2009: ARF, Cr to 7, Resp failure, COPD, intubated in ICU. Appendectomy Coronary artery bypass graft 09/04/01 Cataract extraction Both eyes with IOL 2001 Cholecystectomy Hysterectomy (total) Carotid U/S unchanged 40-50% ICA stenosis--12/1998 Myoview Adeno. Neg EF 76%--06/26/2004 Carotid doppler 12/05, unchanged 9/06 Cardiac cath 09/03/01 Renal U/S Unsatisfactory study 8/01 AAA Repair 1997 EGD Abn. 12/07--esophageal stricture 1/09--06/30/07--gastritis and major bleed Colonoscopy Abn. 03/13/06 EMG--06/17/07--severd  DM neuropathy and radiculopathy---[facility]--06/27/2008--feet, abnormal placement of inferior vena cava filter --07/03/07--[facility] blood transfusions x6 with GI bleed 3/27-07/03/07 stent to R kidney 8/09 renal artery doppler--11/17/2008--severe L common iliac stenosis  Family History: Reviewed history from 11/27/2009 and no changes required.  according last visit. Mother deceased from cancer.  Father deceased from a  CVA.  She had one brother who died from CVA and 3 brothers who died from  MI.   Social History: Reviewed history from 11/27/2009 and no changes required. She lives in [place].  She is married.  She quit  smoking in 1992, but she has a former history of 40-pack years.  Negative for EtOH.  Negative for drug use.  She uses a walker in  the  nursing home for ambulation.      Review of Systems      See HPI General:  Denies chills, fatigue, fever, and weight loss. CV:  Complains of weight gain; denies chest pain or discomfort, difficulty breathing at night, difficulty breathing while lying down, and shortness of breath with exertion. Resp:  Denies shortness of breath, sputum productive,  and wheezing.  Physical Exam  General:  Well-developed,well-nourished,in no acute distress; alert,appropriate and cooperative throughout examination Head:  Normocephalic and atraumatic without obvious abnormalities. No apparent alopecia or balding. Lungs:  Normal respiratory effort, chest expands symmetrically. Lungs are clear to auscultation, no crackles or wheezes. Heart:  Normal rate and regular rhythm. S1 and S2 normal without gallop, 2/6 sys murmur,no click, rub or other extra sounds.  Extremities:  trace bilateral edema Cervical Nodes:  No lymphadenopathy noted Psych:  Cognition and judgment appear intact. Alert and cooperative with normal attention span and concentration. No apparent delusions, illusions, hallucinations   Impression & Recommendations:  Problem # 1:  PEPTIC ULCER DISEASE (ICD-533.90) Assessment Improved Doing well, should be on PPI indefinitely.  Her updated medication list for this problem includes:    Omeprazole 40 Mg Cpdr (Omeprazole) .Marland Kitchen... 1 by mouth 30 minutes before breakfast  Problem # 2:  COPD (ICD-496) The patient is doing well on current inhalers, 02. We have never gotten an outpatient pulm consult, and I would like their input and advice for long-term management. I am primarily looking for recs for long-term medicine management and suggestions if we should keep on current meds and oxygen. I am concerned that if we d/c her inhalers, that the patient will take a turn for the worse, and doing far better now.  Her updated medication list for this problem includes:    Dulera 100-5 Mcg/act Aero (Mometasone furo-formoterol fum) .Marland Kitchen... 1 puff two times a day    Spiriva Handihaler 18 Mcg Caps (Tiotropium bromide monohydrate) .Marland Kitchen... 1 capsule inhaled daily  Orders: Pulmonary Referral (Pulmonary)  Problem # 3:  HYPERTENSION (ICD-401.9) Assessment: Deteriorated increase toprol  Her updated medication list for this problem includes:    Furosemide 80 Mg Tabs  (Furosemide) .Marland Kitchen... 1 tab two times a day    Norvasc 10 Mg Tabs (Amlodipine besylate) .Marland Kitchen... Take one tab by mouth once daily    Metoprolol Succinate 50 Mg Xr24h-tab (Metoprolol succinate) .Marland Kitchen... 1 by mouth daily    Losartan Potassium 50 Mg Tabs (Losartan potassium) .Marland Kitchen... Take 1 tablet by mouth two times a day  Complete Medication List: 1)  Furosemide 80 Mg Tabs (Furosemide) .Marland Kitchen.. 1 tab two times a day 2)  Vytorin 10-20 Mg Tabs (Ezetimibe-simvastatin) .... Take one tablet by mouth daily at bedtime 3)  Norvasc 10 Mg Tabs (Amlodipine besylate) .... Take one tab by mouth once daily 4)  Isosorbide Mononitrate Cr 30 Mg Xr24h-tab (Isosorbide mononitrate) .... Take 1 tablet by mouth once a day 5)  Xalatan 0.005 % Soln (Latanoprost) .... One drop each eye daily 6)  Nitrostat 0.4 Mg Subl (Nitroglycerin) .Marland Kitchen.. 1 tablet under tongue at onset of chest pain; you may repeat every 5 minutes for up to 3 doses. 7)  Klor-con 10 10 Meq Cr-tabs (Potassium chloride) .... Take 1 tablet by mouth once a day 8)  Ketorolac Ophthalimic Solution 0.4 %  .... Rt eye 4 times a day 9)  Oxycodone Hcl 5 Mg Tabs (Oxycodone hcl) .Marland Kitchen.. 1 tab as needed for pain 10)  Mirtazapine 15 Mg Tabs (Mirtazapine) .Marland Kitchen.. 1 by mouth  at bedtime 11)  Dulera 100-5 Mcg/act Aero (Mometasone furo-formoterol fum) .Marland Kitchen.. 1 puff two times a day 12)  Oxigen 2 Litters  13)  Ferrous Sulfate 325 (65 Fe) Mg Tbec (Ferrous sulfate) .... Take 1 tablet by mouth once a day 14)  Spiriva Handihaler 18 Mcg Caps (Tiotropium bromide monohydrate) .Marland Kitchen.. 1 capsule inhaled daily 15)  Metoprolol Succinate 50 Mg Xr24h-tab (Metoprolol succinate) .Marland Kitchen.. 1 by mouth daily 16)  Losartan Potassium 50 Mg Tabs (Losartan potassium) .... Take 1 tablet by mouth two times a day 17)  Omeprazole 40 Mg Cpdr (Omeprazole) .Marland Kitchen.. 1 by mouth 30 minutes before breakfast  Patient Instructions: 1)  f/u 3 months 2)  Referral Appointment Information 3)  Day/Date: 4)  Time: 5)  Place/MD: 6)  Address: 7)   Phone/Fax: 8)  Patient given appointment information. Information/Orders faxed/mailed.  Prescriptions: METOPROLOL SUCCINATE 50 MG XR24H-TAB (METOPROLOL SUCCINATE) 1 by mouth daily  #30 x 11   Entered and Authorized by:   Hannah Beat MD   Signed by:   Hannah Beat MD on 05/31/2010   Method used:   Electronically to        [facility] (retail)       [address]       [place], [place]  [address]       Ph: [phone] or [phone]       Fax: [phone]   RxID:   [id number]    Orders Added: 1)  Pulmonary Referral [Pulmonary] 2)  Est. Patient Level IV [66440]    Current Allergies (reviewed today): ! PCN ! CODEINE ! DEMEROL ! MORPHINE ! DARVON ! SULFA ! * NIASPAN

## 2010-06-12 LAB — BASIC METABOLIC PANEL
BUN: 8 mg/dL (ref 6–23)
Creatinine, Ser: 1.16 mg/dL (ref 0.4–1.2)
GFR calc non Af Amer: 45 mL/min — ABNORMAL LOW (ref >60)
Glucose, Bld: 103 mg/dL — ABNORMAL HIGH (ref 70–99)
Potassium: 4.4 mEq/L (ref 3.5–5.1)

## 2010-06-12 LAB — GLUCOSE, CAPILLARY: Glucose-Capillary: 123 mg/dL — ABNORMAL HIGH (ref 70–99)

## 2010-06-13 LAB — BRAIN NATRIURETIC PEPTIDE
Pro B Natriuretic peptide (BNP): 463 pg/mL — ABNORMAL HIGH (ref 0.0–100.0)
Pro B Natriuretic peptide (BNP): 64 pg/mL (ref 0.0–100.0)

## 2010-06-13 LAB — BASIC METABOLIC PANEL
BUN: 6 mg/dL (ref 6–23)
BUN: 6 mg/dL (ref 6–23)
CO2: 37 mEq/L — ABNORMAL HIGH (ref 19–32)
CO2: 37 mEq/L — ABNORMAL HIGH (ref 19–32)
Calcium: 8.3 mg/dL — ABNORMAL LOW (ref 8.4–10.5)
Calcium: 8.5 mg/dL (ref 8.4–10.5)
Chloride: 101 mEq/L (ref 96–112)
Chloride: 98 mEq/L (ref 96–112)
Creatinine, Ser: 0.92 mg/dL (ref 0.4–1.2)
Creatinine, Ser: 0.93 mg/dL (ref 0.4–1.2)
Creatinine, Ser: 1.12 mg/dL (ref 0.4–1.2)
GFR calc Af Amer: 57 mL/min — ABNORMAL LOW (ref >60)
GFR calc Af Amer: >60 mL/min (ref >60)
GFR calc Af Amer: >60 mL/min (ref >60)
GFR calc non Af Amer: 59 mL/min — ABNORMAL LOW (ref >60)
Glucose, Bld: 114 mg/dL — ABNORMAL HIGH (ref 70–99)
Potassium: 3.6 mEq/L (ref 3.5–5.1)
Potassium: 4.4 mEq/L (ref 3.5–5.1)
Sodium: 142 mEq/L (ref 135–145)
Sodium: 144 mEq/L (ref 135–145)

## 2010-06-13 LAB — CBC
HCT: 31.2 % — ABNORMAL LOW (ref 36.0–46.0)
MCH: 27 pg (ref 26.0–34.0)
MCV: 84.6 fL (ref 78.0–100.0)
MCV: 85.1 fL (ref 78.0–100.0)
Platelets: 128 10*3/uL — ABNORMAL LOW (ref 150–400)
Platelets: 140 10*3/uL — ABNORMAL LOW (ref 150–400)
RBC: 3.56 MIL/uL — ABNORMAL LOW (ref 3.87–5.11)
RBC: 3.69 MIL/uL — ABNORMAL LOW (ref 3.87–5.11)
RDW: 17.2 % — ABNORMAL HIGH (ref 11.5–15.5)
WBC: 6 10*3/uL (ref 4.0–10.5)

## 2010-06-13 LAB — COMPREHENSIVE METABOLIC PANEL
ALT: 9 U/L (ref 0–35)
AST: 18 U/L (ref 0–37)
Alkaline Phosphatase: 93 U/L (ref 39–117)
CO2: 35 mEq/L — ABNORMAL HIGH (ref 19–32)
Calcium: 8.7 mg/dL (ref 8.4–10.5)
GFR calc Af Amer: >60 mL/min (ref >60)
GFR calc non Af Amer: 55 mL/min — ABNORMAL LOW (ref >60)
Potassium: 4 mEq/L (ref 3.5–5.1)
Sodium: 142 mEq/L (ref 135–145)

## 2010-06-13 LAB — DIFFERENTIAL
Basophils Relative: 0 % (ref 0–1)
Eosinophils Absolute: 0.1 10*3/uL (ref 0.0–0.7)
Eosinophils Relative: 2 % (ref 0–5)
Lymphs Abs: 1.2 10*3/uL (ref 0.7–4.0)
Monocytes Absolute: 0.4 10*3/uL (ref 0.1–1.0)

## 2010-06-13 LAB — MAGNESIUM: Magnesium: 1.7 mg/dL (ref 1.5–2.5)

## 2010-06-13 LAB — URINALYSIS, ROUTINE W REFLEX MICROSCOPIC
Nitrite: NEGATIVE
Specific Gravity, Urine: 1.007 (ref 1.005–1.030)
pH: 5.5 (ref 5.0–8.0)

## 2010-06-13 LAB — CARDIAC PANEL(CRET KIN+CKTOT+MB+TROPI)
Relative Index: INVALID (ref 0.0–2.5)
Total CK: 106 U/L (ref 7–177)
Troponin I: 0.02 ng/mL (ref 0.00–0.06)

## 2010-06-13 LAB — URINE MICROSCOPIC-ADD ON

## 2010-06-13 LAB — GLUCOSE, CAPILLARY
Glucose-Capillary: 100 mg/dL — ABNORMAL HIGH (ref 70–99)
Glucose-Capillary: 101 mg/dL — ABNORMAL HIGH (ref 70–99)
Glucose-Capillary: 107 mg/dL — ABNORMAL HIGH (ref 70–99)
Glucose-Capillary: 111 mg/dL — ABNORMAL HIGH (ref 70–99)
Glucose-Capillary: 113 mg/dL — ABNORMAL HIGH (ref 70–99)
Glucose-Capillary: 115 mg/dL — ABNORMAL HIGH (ref 70–99)
Glucose-Capillary: 115 mg/dL — ABNORMAL HIGH (ref 70–99)
Glucose-Capillary: 133 mg/dL — ABNORMAL HIGH (ref 70–99)
Glucose-Capillary: 188 mg/dL — ABNORMAL HIGH (ref 70–99)

## 2010-06-13 LAB — HEMOGLOBIN A1C
Hgb A1c MFr Bld: 5.6 % (ref <5.7)
Mean Plasma Glucose: 114 mg/dL (ref <117)

## 2010-06-13 LAB — LIPID PANEL: HDL: 39 mg/dL — ABNORMAL LOW (ref >39)

## 2010-06-13 LAB — POCT CARDIAC MARKERS: Troponin i, poc: <0.05 ng/mL (ref 0.00–0.09)

## 2010-06-13 LAB — TSH: TSH: 2.596 u[IU]/mL (ref 0.350–4.500)

## 2010-06-14 ENCOUNTER — Encounter: Payer: Self-pay | Admitting: Emergency Medicine

## 2010-06-14 ENCOUNTER — Institutional Professional Consult (permissible substitution) (INDEPENDENT_AMBULATORY_CARE_PROVIDER_SITE_OTHER): Payer: Medicare Other | Admitting: Emergency Medicine

## 2010-06-14 DIAGNOSIS — J449 Chronic obstructive pulmonary disease, unspecified: Secondary | ICD-10-CM

## 2010-06-14 LAB — BLOOD GAS, ARTERIAL
Acid-base deficit: 5.4 mmol/L — ABNORMAL HIGH (ref 0.0–2.0)
Bicarbonate: 20.5 mEq/L (ref 20.0–24.0)
Bicarbonate: 20.8 mEq/L (ref 20.0–24.0)
FIO2: 0.5 %
O2 Saturation: 91 %
Patient temperature: 98.6
TCO2: 21.8 mmol/L (ref 0–100)
TCO2: 22.3 mmol/L (ref 0–100)
pCO2 arterial: 43.7 mmHg (ref 35.0–45.0)
pO2, Arterial: 69 mmHg — ABNORMAL LOW (ref 80.0–100.0)

## 2010-06-14 LAB — CBC
HCT: 30.9 % — ABNORMAL LOW (ref 36.0–46.0)
HCT: 33.1 % — ABNORMAL LOW (ref 36.0–46.0)
Hemoglobin: 10.1 g/dL — ABNORMAL LOW (ref 12.0–15.0)
Hemoglobin: 10.2 g/dL — ABNORMAL LOW (ref 12.0–15.0)
Hemoglobin: 10.4 g/dL — ABNORMAL LOW (ref 12.0–15.0)
Hemoglobin: 9.5 g/dL — ABNORMAL LOW (ref 12.0–15.0)
Hemoglobin: 9.9 g/dL — ABNORMAL LOW (ref 12.0–15.0)
Hemoglobin: 10.3 g/dL — ABNORMAL LOW (ref 12.0–15.0)
Hemoglobin: 11.6 g/dL — ABNORMAL LOW (ref 12.0–15.0)
MCH: 24.9 pg — ABNORMAL LOW (ref 26.0–34.0)
MCH: 25.4 pg — ABNORMAL LOW (ref 26.0–34.0)
MCH: 25.9 pg — ABNORMAL LOW (ref 26.0–34.0)
MCH: 26 pg (ref 26.0–34.0)
MCH: 25.3 pg — ABNORMAL LOW (ref 26.0–34.0)
MCH: 25.6 pg — ABNORMAL LOW (ref 26.0–34.0)
MCH: 26.1 pg (ref 26.0–34.0)
MCHC: 31.4 g/dL (ref 30.0–36.0)
MCHC: 31.5 g/dL (ref 30.0–36.0)
MCHC: 31.6 g/dL (ref 30.0–36.0)
MCHC: 32 g/dL (ref 30.0–36.0)
MCHC: 32.1 g/dL (ref 30.0–36.0)
MCV: 79.4 fL (ref 78.0–100.0)
MCV: 79.3 fL (ref 78.0–100.0)
MCV: 79.6 fL (ref 78.0–100.0)
Platelets: 108 10*3/uL — ABNORMAL LOW (ref 150–400)
Platelets: 101 10*3/uL — ABNORMAL LOW (ref 150–400)
Platelets: 101 10*3/uL — ABNORMAL LOW (ref 150–400)
Platelets: 91 10*3/uL — ABNORMAL LOW (ref 150–400)
Platelets: 93 10*3/uL — ABNORMAL LOW (ref 150–400)
RBC: 3.7 MIL/uL — ABNORMAL LOW (ref 3.87–5.11)
RBC: 3.98 MIL/uL (ref 3.87–5.11)
RBC: 4.02 MIL/uL (ref 3.87–5.11)
RBC: 4.07 MIL/uL (ref 3.87–5.11)
RBC: 4.45 MIL/uL (ref 3.87–5.11)
RDW: 19 % — ABNORMAL HIGH (ref 11.5–15.5)
RDW: 18.8 % — ABNORMAL HIGH (ref 11.5–15.5)
RDW: 18.9 % — ABNORMAL HIGH (ref 11.5–15.5)
RDW: 19 % — ABNORMAL HIGH (ref 11.5–15.5)
WBC: 4.5 10*3/uL (ref 4.0–10.5)
WBC: 5.3 10*3/uL (ref 4.0–10.5)

## 2010-06-14 LAB — GLUCOSE, CAPILLARY
Glucose-Capillary: 100 mg/dL — ABNORMAL HIGH (ref 70–99)
Glucose-Capillary: 100 mg/dL — ABNORMAL HIGH (ref 70–99)
Glucose-Capillary: 103 mg/dL — ABNORMAL HIGH (ref 70–99)
Glucose-Capillary: 106 mg/dL — ABNORMAL HIGH (ref 70–99)
Glucose-Capillary: 113 mg/dL — ABNORMAL HIGH (ref 70–99)
Glucose-Capillary: 114 mg/dL — ABNORMAL HIGH (ref 70–99)
Glucose-Capillary: 116 mg/dL — ABNORMAL HIGH (ref 70–99)
Glucose-Capillary: 119 mg/dL — ABNORMAL HIGH (ref 70–99)
Glucose-Capillary: 120 mg/dL — ABNORMAL HIGH (ref 70–99)
Glucose-Capillary: 120 mg/dL — ABNORMAL HIGH (ref 70–99)
Glucose-Capillary: 121 mg/dL — ABNORMAL HIGH (ref 70–99)
Glucose-Capillary: 122 mg/dL — ABNORMAL HIGH (ref 70–99)
Glucose-Capillary: 135 mg/dL — ABNORMAL HIGH (ref 70–99)
Glucose-Capillary: 95 mg/dL (ref 70–99)
Glucose-Capillary: 84 mg/dL (ref 70–99)
Glucose-Capillary: 91 mg/dL (ref 70–99)
Glucose-Capillary: 93 mg/dL (ref 70–99)
Glucose-Capillary: 95 mg/dL (ref 70–99)

## 2010-06-14 LAB — UIFE/LIGHT CHAINS/TP QN, 24-HR UR
Albumin, U: DETECTED
Albumin, U: DETECTED
Alpha 1, Urine: DETECTED — AB
Alpha 1, Urine: DETECTED — AB
Alpha 2, Urine: DETECTED — AB
Free Kappa Lt Chains,Ur: 11.7 mg/dL — ABNORMAL HIGH (ref 0.04–1.51)
Free Kappa/Lambda Ratio: 4.06 ratio — ABNORMAL HIGH (ref 0.46–4.00)
Volume, Urine: 2100 mL

## 2010-06-14 LAB — BASIC METABOLIC PANEL
BUN: 52 mg/dL — ABNORMAL HIGH (ref 6–23)
CO2: 22 mEq/L (ref 19–32)
CO2: 28 mEq/L (ref 19–32)
CO2: 31 mEq/L (ref 19–32)
Calcium: 8.3 mg/dL — ABNORMAL LOW (ref 8.4–10.5)
Calcium: 8.6 mg/dL (ref 8.4–10.5)
Calcium: 8.7 mg/dL (ref 8.4–10.5)
Calcium: 8.9 mg/dL (ref 8.4–10.5)
Chloride: 109 mEq/L (ref 96–112)
Creatinine, Ser: 4.33 mg/dL — ABNORMAL HIGH (ref 0.4–1.2)
GFR calc Af Amer: 23 mL/min — ABNORMAL LOW (ref >60)
GFR calc Af Amer: 57 mL/min — ABNORMAL LOW (ref >60)
GFR calc non Af Amer: 10 mL/min — ABNORMAL LOW (ref >60)
GFR calc non Af Amer: 19 mL/min — ABNORMAL LOW (ref >60)
GFR calc non Af Amer: 30 mL/min — ABNORMAL LOW (ref >60)
GFR calc non Af Amer: 47 mL/min — ABNORMAL LOW (ref >60)
Glucose, Bld: 108 mg/dL — ABNORMAL HIGH (ref 70–99)
Glucose, Bld: 116 mg/dL — ABNORMAL HIGH (ref 70–99)
Glucose, Bld: 88 mg/dL (ref 70–99)
Glucose, Bld: 96 mg/dL (ref 70–99)
Potassium: 3.9 mEq/L (ref 3.5–5.1)
Sodium: 138 mEq/L (ref 135–145)
Sodium: 140 mEq/L (ref 135–145)
Sodium: 142 mEq/L (ref 135–145)
Sodium: 143 mEq/L (ref 135–145)
Sodium: 144 mEq/L (ref 135–145)

## 2010-06-14 LAB — COMPREHENSIVE METABOLIC PANEL
ALT: 24 U/L (ref 0–35)
AST: 42 U/L — ABNORMAL HIGH (ref 0–37)
Albumin: 2.8 g/dL — ABNORMAL LOW (ref 3.5–5.2)
Albumin: 3.2 g/dL — ABNORMAL LOW (ref 3.5–5.2)
Alkaline Phosphatase: 77 U/L (ref 39–117)
BUN: 66 mg/dL — ABNORMAL HIGH (ref 6–23)
CO2: 22 mEq/L (ref 19–32)
Chloride: 97 mEq/L (ref 96–112)
Creatinine, Ser: 6.98 mg/dL — ABNORMAL HIGH (ref 0.4–1.2)
GFR calc Af Amer: 7 mL/min — ABNORMAL LOW (ref >60)
Potassium: 5.3 mEq/L — ABNORMAL HIGH (ref 3.5–5.1)
Sodium: 133 mEq/L — ABNORMAL LOW (ref 135–145)
Total Bilirubin: 0.3 mg/dL (ref 0.3–1.2)
Total Protein: 5.4 g/dL — ABNORMAL LOW (ref 6.0–8.3)

## 2010-06-14 LAB — DIFFERENTIAL
Eosinophils Absolute: 0.1 10*3/uL (ref 0.0–0.7)
Eosinophils Relative: 2 % (ref 0–5)
Lymphocytes Relative: 26 % (ref 12–46)
Lymphs Abs: 1.4 10*3/uL (ref 0.7–4.0)
Monocytes Absolute: 0.4 10*3/uL (ref 0.1–1.0)

## 2010-06-14 LAB — URINALYSIS, ROUTINE W REFLEX MICROSCOPIC
Protein, ur: NEGATIVE mg/dL
Urobilinogen, UA: 0.2 mg/dL (ref 0.0–1.0)

## 2010-06-14 LAB — PROTEIN ELECTROPHORESIS, SERUM
M-Spike, %: NOT DETECTED g/dL
Total Protein ELP: 5.5 g/dL — ABNORMAL LOW (ref 6.0–8.3)

## 2010-06-14 LAB — CK TOTAL AND CKMB (NOT AT ARMC)
CK, MB: 3.5 ng/mL (ref 0.3–4.0)
Relative Index: 0.5 (ref 0.0–2.5)
Total CK: 717 U/L — ABNORMAL HIGH (ref 7–177)

## 2010-06-14 LAB — HEPARIN INDUCED THROMBOCYTOPENIA PNL
UFH High Dose UFH H: 7 % Release
UFH Low Dose 0.1 IU/mL: 2 % Release
UFH Low Dose 0.5 IU/mL: 5 % Release
UFH SRA Result: NEGATIVE

## 2010-06-14 LAB — URINE MICROSCOPIC-ADD ON

## 2010-06-14 LAB — CREATININE, URINE, RANDOM: Creatinine, Urine: 103.2 mg/dL

## 2010-06-14 LAB — RETICULOCYTES
RBC.: 4.04 MIL/uL (ref 3.87–5.11)
Retic Ct Pct: 0.4 % (ref 0.4–3.1)

## 2010-06-14 LAB — FOLATE: Folate: 13.9 ng/mL

## 2010-06-14 LAB — CARDIAC PANEL(CRET KIN+CKTOT+MB+TROPI)
CK, MB: 2.9 ng/mL (ref 0.3–4.0)
Relative Index: 0.5 (ref 0.0–2.5)
Total CK: 575 U/L — ABNORMAL HIGH (ref 7–177)
Total CK: 587 U/L — ABNORMAL HIGH (ref 7–177)
Troponin I: 0.04 ng/mL (ref 0.00–0.06)
Troponin I: 0.05 ng/mL (ref 0.00–0.06)

## 2010-06-14 LAB — TYPE AND SCREEN: Antibody Screen: NEGATIVE

## 2010-06-14 LAB — IRON AND TIBC: UIBC: 108 ug/dL

## 2010-06-14 LAB — URINE CULTURE: Colony Count: 30000

## 2010-06-14 LAB — SODIUM, URINE, RANDOM: Sodium, Ur: 23 mEq/L

## 2010-06-14 LAB — HEMOGLOBIN A1C: Hgb A1c MFr Bld: 5.7 % — ABNORMAL HIGH (ref <5.7)

## 2010-06-14 LAB — D-DIMER, QUANTITATIVE: D-Dimer, Quant: 2.24 ug/mL-FEU — ABNORMAL HIGH (ref 0.00–0.48)

## 2010-06-15 LAB — CBC
HCT: 26.4 % — ABNORMAL LOW (ref 36.0–46.0)
HCT: 28.4 % — ABNORMAL LOW (ref 36.0–46.0)
HCT: 32.6 % — ABNORMAL LOW (ref 36.0–46.0)
HCT: 34 % — ABNORMAL LOW (ref 36.0–46.0)
Hemoglobin: 7.7 g/dL — ABNORMAL LOW (ref 12.0–15.0)
Hemoglobin: 9.9 g/dL — ABNORMAL LOW (ref 12.0–15.0)
MCH: 24.3 pg — ABNORMAL LOW (ref 26.0–34.0)
MCH: 24.6 pg — ABNORMAL LOW (ref 26.0–34.0)
MCHC: 29.2 g/dL — ABNORMAL LOW (ref 30.0–36.0)
MCHC: 29.7 g/dL — ABNORMAL LOW (ref 30.0–36.0)
MCV: 80.2 fL (ref 78.0–100.0)
MCV: 81.1 fL (ref 78.0–100.0)
MCV: 81.9 fL (ref 78.0–100.0)
Platelets: 187 10*3/uL (ref 150–400)
RBC: 3.28 MIL/uL — ABNORMAL LOW (ref 3.87–5.11)
RBC: 3.54 MIL/uL — ABNORMAL LOW (ref 3.87–5.11)
RBC: 4.02 MIL/uL (ref 3.87–5.11)
RDW: 15.6 % — ABNORMAL HIGH (ref 11.5–15.5)
RDW: 16.2 % — ABNORMAL HIGH (ref 11.5–15.5)
WBC: 4.9 10*3/uL (ref 4.0–10.5)
WBC: 6 10*3/uL (ref 4.0–10.5)
WBC: 6.4 10*3/uL (ref 4.0–10.5)

## 2010-06-15 LAB — BASIC METABOLIC PANEL
BUN: 12 mg/dL (ref 6–23)
BUN: 14 mg/dL (ref 6–23)
CO2: 29 mEq/L (ref 19–32)
CO2: 30 mEq/L (ref 19–32)
CO2: 31 mEq/L (ref 19–32)
Chloride: 102 mEq/L (ref 96–112)
Chloride: 102 mEq/L (ref 96–112)
Chloride: 104 mEq/L (ref 96–112)
Creatinine, Ser: 1.42 mg/dL — ABNORMAL HIGH (ref 0.4–1.2)
Creatinine, Ser: 1.47 mg/dL — ABNORMAL HIGH (ref 0.4–1.2)
GFR calc Af Amer: 51 mL/min — ABNORMAL LOW (ref >60)
Glucose, Bld: 101 mg/dL — ABNORMAL HIGH (ref 70–99)
Potassium: 3.3 mEq/L — ABNORMAL LOW (ref 3.5–5.1)
Potassium: 3.8 mEq/L (ref 3.5–5.1)
Potassium: 4.1 mEq/L (ref 3.5–5.1)

## 2010-06-15 LAB — CROSSMATCH

## 2010-06-15 LAB — TSH: TSH: 1.347 u[IU]/mL (ref 0.350–4.500)

## 2010-06-15 LAB — GLUCOSE, CAPILLARY
Glucose-Capillary: 100 mg/dL — ABNORMAL HIGH (ref 70–99)
Glucose-Capillary: 98 mg/dL (ref 70–99)

## 2010-06-15 LAB — PROTIME-INR
INR: 1.17 (ref 0.00–1.49)
Prothrombin Time: 15.1 seconds (ref 11.6–15.2)

## 2010-06-20 ENCOUNTER — Ambulatory Visit (INDEPENDENT_AMBULATORY_CARE_PROVIDER_SITE_OTHER): Payer: Medicare Other | Admitting: Family Medicine

## 2010-06-20 ENCOUNTER — Encounter: Payer: Self-pay | Admitting: Family Medicine

## 2010-06-20 VITALS — BP 160/70 | HR 57 | Temp 97.8°F | Ht 64.0 in | Wt 165.0 lb

## 2010-06-20 DIAGNOSIS — L259 Unspecified contact dermatitis, unspecified cause: Secondary | ICD-10-CM

## 2010-06-20 DIAGNOSIS — L239 Allergic contact dermatitis, unspecified cause: Secondary | ICD-10-CM

## 2010-06-20 MED ORDER — ISOSORBIDE MONONITRATE ER 30 MG PO TB24: 30.0000 mg | ORAL_TABLET | Freq: Every day | ORAL | Status: DC

## 2010-06-20 MED ORDER — TRIAMCINOLONE ACETONIDE 0.1 % EX CREA: TOPICAL_CREAM | Freq: Two times a day (BID) | CUTANEOUS | Status: DC

## 2010-06-20 NOTE — Patient Instructions (Signed)
Apply cream a couple of times a day to the spots, and it will likely get better in a few weeks.

## 2010-06-20 NOTE — Progress Notes (Signed)
77 year old female:  Rash, for a week or so, B LE. Not itchy. No known exposures. No new meds, soaps, lotions. No bug exposures.  ROS: occ sob with exertion, doing much better on oxygen, no fluid retention.  GEN: WDWN, NAD, Non-toxic, A & O x 3 HEENT: Atraumatic, Normocephalic. Neck supple. No masses, No LAD. SKIN: mildly elevated, scaly areas, LE, B, approx 5 mm - 1 cm across EXTR: No c/c/e NEURO Normal gait.  PSYCH: Normally interactive. Conversant. Not depressed or anxious appearing.  Calm demeanor.   A/P: allergic dermatitis, probable, tx with TAC 0.1% bid until clear

## 2010-06-28 NOTE — Assessment & Plan Note (Signed)
Summary: COPD   Visit Type:  Initial Consult Copy to:  Dr. Karleen Hampshire Copland Primary Provider/Referring Provider:  Hannah Beat MD  CC:  Pulmonary consult.  Pt c/o incr SOB w/ exertion.  Has been on oxygen x1 year and 2 liters 24/7.  Cough is prod w/ white to yellow sputum.Donna Reese  History of Present Illness: 77 yo woman, former tobacco. Hx of CAD, HTN w diastolic dysfxn, DM, RAS w hx renal failure and ICU hospitalization in 11/2009, s/p stenting. Aortobifemoral bypass. Hx DVT and PE, anticoag stopped yrs ago after GIB. She is s/p IVC filter placement. Dx with COPD several yrs ago, on O2 for at least a year (she cant recall when started).   Referred by Dr Dallas Schimke for her COPD. She complains of some L back and flank pain w a deep breath, exertional dyspnea especially when rushing. She is still able to do some housework, unable to grocery shop for several years. Complains of a lot of throat clearing, daily cough prod white phlegm. No recent PFTs. She can't recall any exacerbations that require abx or prednisone. She is on pulsed oxygen, but is not geting a pulse with every breath. Occas nasal gtt, not daily. Occas GERD on omeprazole. On Dulera and Spiriva. She does not have a rescue inhaler.   CXR 10/11 - hyperinflation, mild interstitial prominence, R effusion and LL vol loss.   Preventive Screening-Counseling & Management  Alcohol-Tobacco     Alcohol drinks/day: 0     Smoking Status: quit     Packs/Day: 1.0     Year Started: 1957     Year Quit: 1997     Pack years: 8  Current Medications (verified): 1)  Furosemide 80 Mg Tabs (Furosemide) .Donna Reese.. 1 Tab Two Times A Day 2)  Vytorin 10-20 Mg Tabs (Ezetimibe-Simvastatin) .... Take One Tablet By Mouth Daily At Bedtime 3)  Norvasc 10 Mg Tabs (Amlodipine Besylate) .... Take One Tab By Mouth Once Daily 4)  Isosorbide Mononitrate Cr 30 Mg Xr24h-Tab (Isosorbide Mononitrate) .... Take 1 Tablet By Mouth Once A Day 5)  Xalatan 0.005 % Soln (Latanoprost)  .... One Drop Each Eye Daily 6)  Nitrostat 0.4 Mg Subl (Nitroglycerin) .Donna Reese.. 1 Tablet Under Tongue At Onset of Chest Pain; You May Repeat Every 5 Minutes For Up To 3 Doses. 7)  Klor-Con 10 10 Meq Cr-Tabs (Potassium Chloride) .... Take 1 Tablet By Mouth Once A Day 8)  Ketorolac Ophthalimic Solution 0.4 % .... Rt Eye 4 Times A Day 9)  Oxycodone Hcl 5 Mg Tabs (Oxycodone Hcl) .Donna Reese.. 1 Tab As Needed For Pain 10)  Mirtazapine 15 Mg Tabs (Mirtazapine) .Donna Reese.. 1 By Mouth At Bedtime 11)  Dulera 100-5 Mcg/act Aero (Mometasone Furo-Formoterol Fum) .Donna Reese.. 1 Puff Two Times A Day 12)  Oxigen 2 Litters 13)  Ferrous Sulfate 325 (65 Fe) Mg Tbec (Ferrous Sulfate) .... Take 1 Tablet By Mouth Once A Day 14)  Spiriva Handihaler 18 Mcg Caps (Tiotropium Bromide Monohydrate) .Donna Reese.. 1 Capsule Inhaled Daily 15)  Metoprolol Succinate 50 Mg Xr24h-Tab (Metoprolol Succinate) .Donna Reese.. 1 By Mouth Daily 16)  Losartan Potassium 50 Mg Tabs (Losartan Potassium) .... Take 1 Tablet By Mouth Two Times A Day 17)  Omeprazole 40 Mg Cpdr (Omeprazole) .Donna Reese.. 1 By Mouth 30 Minutes Before Breakfast  Allergies (verified): 1)  ! Pcn 2)  ! Codeine 3)  ! Demerol 4)  ! Morphine 5)  ! Darvon 6)  ! Sulfa 7)  ! * Niaspan  Past History:  Past Medical History:  Diabetes mellitus, type II 8/05 COPD, on oxygen Diverticulosis, colon inflamed hemorrhoids 03/13/06 Osteopenia 09/1999, Dexa 8/05 Osteopenia Peptic ulcer disease Peripheral vascular disease Diastolic heart failure Coronary artery disease, status post prior coronary bypass graft     surgery with  patent grafts  2010 Renovascular hypertension. Renal artery stenosis, status post recent renal artery stenting on     the left. History of paroxysmal atrial fibrillation, not a Coumadin     candidate. History of deep vein thrombophlebitis and pulmonary embolism,     status post IV filter placement. History of gastrointestinal bleeding. Hypertension. S/P AAA repair Hyperlipidemia. gastric  ersions  Past Surgical History: 11/2009: ARF, Cr to 7, Resp failure, COPD, intubated in ICU. Appendectomy Coronary artery bypass graft 09/04/01 Cataract extraction Both eyes with IOL 2001 Cholecystectomy Hysterectomy (total) Carotid U/S unchanged 40-50% ICA stenosis--12/1998 Myoview Adeno. Neg EF 76%--06/26/2004 Carotid doppler 12/05, unchanged 9/06 Cardiac cath 09/03/01 Renal U/S Unsatisfactory study 8/01 AAA Repair 1997 EGD Abn. 12/07--esophageal stricture 1/09--06/30/07--gastritis and major bleed Colonoscopy Abn. 03/13/06 EMG--06/17/07--severd  DM neuropathy and radiculopathy---[facility]--06/27/2008--feet, abnormal placement of inferior vena cava filter --07/03/07--[facility] blood transfusions x6 with GI bleed 3/27-07/03/07 stent to R kidney 8/09 renal artery doppler--11/17/2008--severe L common iliac stenosis  Family History: Reviewed history from 11/27/2009 and no changes required.  according last visit. Mother deceased from cancer.  Father deceased from a  CVA.  She had one brother who died from CVA and 3 brothers who died from  MI.  Family History Diabetes --- mother Family History Asthma --- mother and sister Stomach cancer --- mother  Social History: Reviewed history from 11/27/2009 and no changes required. She lives in [place].  She is married.  She quit smoking in 1992, but she has a former history of 40-pack years.  Negative for EtOH.  Negative for drug use.  She uses a walker in the  nursing home for ambulation.     Alcohol drinks/day:  0 Packs/Day:  1.0 Pack years:  40  Review of Systems       The patient complains of shortness of breath with activity, shortness of breath at rest, irregular heartbeats, weight change, sneezing, and joint stiffness or pain.  The patient denies productive cough, non-productive cough, coughing up blood, chest pain, acid heartburn, indigestion, loss of appetite, abdominal pain, difficulty swallowing, sore throat, tooth/dental problems,  headaches, nasal congestion/difficulty breathing through nose, itching, ear ache, anxiety, depression, hand/feet swelling, rash, change in color of mucus, and fever.    Vital Signs:  Patient profile:   77 year old female Height:      62 inches (157.48 cm) Weight:      168.13 pounds (76.42 kg) BMI:     30.86 O2 Sat:      95 % on 2 L/min Temp:     97.6 degrees F (36.44 degrees C) oral Pulse rate:   58 / minute BP sitting:   158 / 62  (left arm) Cuff size:   regular  Vitals Entered By: Michel Bickers CMA (June 14, 2010 10:15 AM)  O2 Sat at Rest %:  95 O2 Flow:  2 L/min  Serial Vital Signs/Assessments:  Comments: 11:41 AM Ambulatory Pulse Oximetry  Resting; HR__52___    02 Sat__96% on 2L___  Lap1 (185 feet)   HR_68____   02 Sat__96% on 2L___ Lap2 (185 feet)   HR__68___   02 Sat__96% on 2L___    Lap3 (185 feet)   HR_____   02 Sat_____  ___Test Completed without Difficulty _x__Test  Stopped due to: patient said her legs were tired and needed to stop  By: Michel Bickers CMA   CC: Pulmonary consult.  Pt c/o incr SOB w/ exertion.  Has been on oxygen x1 year, 2 liters 24/7.  Cough is prod w/ white to yellow sputum. Is Patient Diabetic? No Comments Medications reviewed with patient Michel Bickers CMA  June 14, 2010 10:53 AM   Physical Exam  General:  elderly woman, NAD on O2 Head:  normocephalic and atraumatic Eyes:  conjunctiva and sclera clear Nose:  no deformity, discharge, inflammation, or lesions Mouth:  no deformity or lesions Neck:  no masses, thyromegaly, or abnormal cervical nodes Lungs:  distant but clear, no wheeze Heart:  irregular, distant Abdomen:  not examined Msk:  kyphotic Extremities:  1+ edema Skin:  intact without lesions or rashes Psych:  poor memory, oriented, and poor historian.     Impression & Recommendations:  Problem # 1:  COPD (ICD-496) She is unclear whether her dyspnea is progressive, but referred for worsening and more cough - full PFT -  continue current BDs - walking oximitry on 2L/min pulsed - start SABA as needed  - rov 1 month to review PFT  Other Orders: Consultation Level IV (11914)  Patient Instructions: 1)  Full Pulmonary Function Testing 2)  Walking oximetry on 2L/min oxygen 3)  Start Ventolin 2 puffs up to every 4 hours if needed for shortness of breath 4)  Follow up with Dr Delton Coombes in 1 month

## 2010-07-05 ENCOUNTER — Other Ambulatory Visit (INDEPENDENT_AMBULATORY_CARE_PROVIDER_SITE_OTHER): Payer: Medicare Other | Admitting: Family Medicine

## 2010-07-05 DIAGNOSIS — L259 Unspecified contact dermatitis, unspecified cause: Secondary | ICD-10-CM

## 2010-07-11 LAB — BASIC METABOLIC PANEL
CO2: 31 mEq/L (ref 19–32)
CO2: 31 mEq/L (ref 19–32)
Chloride: 105 mEq/L (ref 96–112)
Chloride: 103 mEq/L (ref 96–112)
Creatinine, Ser: 1.11 mg/dL (ref 0.4–1.2)
Creatinine, Ser: 1.25 mg/dL — ABNORMAL HIGH (ref 0.4–1.2)
GFR calc Af Amer: 58 mL/min — ABNORMAL LOW (ref >60)
GFR calc Af Amer: 51 mL/min — ABNORMAL LOW (ref >60)
Glucose, Bld: 98 mg/dL (ref 70–99)
Potassium: 4.5 mEq/L (ref 3.5–5.1)
Potassium: 5.4 mEq/L — ABNORMAL HIGH (ref 3.5–5.1)
Sodium: 141 mEq/L (ref 135–145)
Sodium: 142 mEq/L (ref 135–145)

## 2010-07-11 LAB — GLUCOSE, CAPILLARY
Glucose-Capillary: 107 mg/dL — ABNORMAL HIGH (ref 70–99)
Glucose-Capillary: 110 mg/dL — ABNORMAL HIGH (ref 70–99)
Glucose-Capillary: 95 mg/dL (ref 70–99)
Glucose-Capillary: 100 mg/dL — ABNORMAL HIGH (ref 70–99)
Glucose-Capillary: 102 mg/dL — ABNORMAL HIGH (ref 70–99)
Glucose-Capillary: 103 mg/dL — ABNORMAL HIGH (ref 70–99)
Glucose-Capillary: 105 mg/dL — ABNORMAL HIGH (ref 70–99)
Glucose-Capillary: 105 mg/dL — ABNORMAL HIGH (ref 70–99)
Glucose-Capillary: 106 mg/dL — ABNORMAL HIGH (ref 70–99)
Glucose-Capillary: 106 mg/dL — ABNORMAL HIGH (ref 70–99)
Glucose-Capillary: 110 mg/dL — ABNORMAL HIGH (ref 70–99)
Glucose-Capillary: 111 mg/dL — ABNORMAL HIGH (ref 70–99)
Glucose-Capillary: 114 mg/dL — ABNORMAL HIGH (ref 70–99)
Glucose-Capillary: 120 mg/dL — ABNORMAL HIGH (ref 70–99)
Glucose-Capillary: 122 mg/dL — ABNORMAL HIGH (ref 70–99)
Glucose-Capillary: 124 mg/dL — ABNORMAL HIGH (ref 70–99)
Glucose-Capillary: 91 mg/dL (ref 70–99)
Glucose-Capillary: 94 mg/dL (ref 70–99)
Glucose-Capillary: 98 mg/dL (ref 70–99)
Glucose-Capillary: 98 mg/dL (ref 70–99)
Glucose-Capillary: 99 mg/dL (ref 70–99)

## 2010-07-11 LAB — URINALYSIS, ROUTINE W REFLEX MICROSCOPIC
Ketones, ur: NEGATIVE mg/dL
Nitrite: NEGATIVE
Protein, ur: NEGATIVE mg/dL

## 2010-07-11 LAB — CARDIAC PANEL(CRET KIN+CKTOT+MB+TROPI)
CK, MB: 0.8 ng/mL (ref 0.3–4.0)
Relative Index: INVALID (ref 0.0–2.5)
Total CK: 29 U/L (ref 7–177)
Troponin I: <0.01 ng/mL (ref 0.00–0.06)

## 2010-07-11 LAB — DIFFERENTIAL
Basophils Absolute: 0.1 10*3/uL (ref 0.0–0.1)
Lymphocytes Relative: 9 % — ABNORMAL LOW (ref 12–46)
Monocytes Absolute: 0.2 10*3/uL (ref 0.1–1.0)
Monocytes Relative: 3 % (ref 3–12)
Neutro Abs: 7 10*3/uL (ref 1.7–7.7)

## 2010-07-11 LAB — CK TOTAL AND CKMB (NOT AT ARMC): Relative Index: INVALID (ref 0.0–2.5)

## 2010-07-11 LAB — CBC
HCT: 31.7 % — ABNORMAL LOW (ref 36.0–46.0)
Hemoglobin: 10.6 g/dL — ABNORMAL LOW (ref 12.0–15.0)
Hemoglobin: 10.8 g/dL — ABNORMAL LOW (ref 12.0–15.0)
MCHC: 34.2 g/dL (ref 30.0–36.0)
MCV: 79.9 fL (ref 78.0–100.0)
RBC: 3.85 MIL/uL — ABNORMAL LOW (ref 3.87–5.11)
RBC: 3.97 MIL/uL (ref 3.87–5.11)
RDW: 18.8 % — ABNORMAL HIGH (ref 11.5–15.5)

## 2010-07-11 LAB — TROPONIN I: Troponin I: <0.01 ng/mL (ref 0.00–0.06)

## 2010-07-11 LAB — HEMOGLOBIN A1C: Mean Plasma Glucose: 117 mg/dL

## 2010-07-11 LAB — POCT I-STAT, CHEM 8
Calcium, Ion: 1.13 mmol/L (ref 1.12–1.32)
Chloride: 103 mEq/L (ref 96–112)
Creatinine, Ser: 1.2 mg/dL (ref 0.4–1.2)
Glucose, Bld: 100 mg/dL — ABNORMAL HIGH (ref 70–99)
HCT: 32 % — ABNORMAL LOW (ref 36.0–46.0)
Hemoglobin: 10.9 g/dL — ABNORMAL LOW (ref 12.0–15.0)

## 2010-07-11 LAB — HEPATIC FUNCTION PANEL
ALT: 14 U/L (ref 0–35)
AST: 21 U/L (ref 0–37)
Alkaline Phosphatase: 109 U/L (ref 39–117)
Bilirubin, Direct: 0.1 mg/dL (ref 0.0–0.3)
Indirect Bilirubin: 0.2 mg/dL — ABNORMAL LOW (ref 0.3–0.9)

## 2010-07-11 LAB — BRAIN NATRIURETIC PEPTIDE
Pro B Natriuretic peptide (BNP): 527 pg/mL — ABNORMAL HIGH (ref 0.0–100.0)
Pro B Natriuretic peptide (BNP): 634 pg/mL — ABNORMAL HIGH (ref 0.0–100.0)

## 2010-07-11 LAB — H. PYLORI ANTIBODY, IGG: H Pylori IgG: 7.1 {ISR} — ABNORMAL HIGH

## 2010-07-12 LAB — DIFFERENTIAL
Basophils Relative: 1 % (ref 0–1)
Eosinophils Absolute: 0.1 10*3/uL (ref 0.0–0.7)
Eosinophils Relative: 1 % (ref 0–5)
Lymphs Abs: 1 10*3/uL (ref 0.7–4.0)
Monocytes Absolute: 0.4 10*3/uL (ref 0.1–1.0)
Monocytes Relative: 7 % (ref 3–12)
Neutrophils Relative %: 73 % (ref 43–77)

## 2010-07-12 LAB — URINE CULTURE: Colony Count: >=100000

## 2010-07-12 LAB — TROPONIN I: Troponin I: 0.02 ng/mL (ref 0.00–0.06)

## 2010-07-12 LAB — CBC
HCT: 33.1 % — ABNORMAL LOW (ref 36.0–46.0)
Hemoglobin: 11.5 g/dL — ABNORMAL LOW (ref 12.0–15.0)
MCHC: 33.4 g/dL (ref 30.0–36.0)
MCV: 81.3 fL (ref 78.0–100.0)
MCV: 81.4 fL (ref 78.0–100.0)
Platelets: 137 10*3/uL — ABNORMAL LOW (ref 150–400)
Platelets: 138 10*3/uL — ABNORMAL LOW (ref 150–400)
RBC: 3.96 MIL/uL (ref 3.87–5.11)
RBC: 4.29 MIL/uL (ref 3.87–5.11)
RDW: 17.4 % — ABNORMAL HIGH (ref 11.5–15.5)
RDW: 17.5 % — ABNORMAL HIGH (ref 11.5–15.5)
WBC: 5.2 10*3/uL (ref 4.0–10.5)

## 2010-07-12 LAB — COMPREHENSIVE METABOLIC PANEL
ALT: 15 U/L (ref 0–35)
AST: 20 U/L (ref 0–37)
Alkaline Phosphatase: 114 U/L (ref 39–117)
CO2: 32 mEq/L (ref 19–32)
GFR calc Af Amer: 37 mL/min — ABNORMAL LOW (ref >60)
Glucose, Bld: 106 mg/dL — ABNORMAL HIGH (ref 70–99)
Potassium: 4.1 mEq/L (ref 3.5–5.1)
Sodium: 143 mEq/L (ref 135–145)
Total Protein: 6.8 g/dL (ref 6.0–8.3)

## 2010-07-12 LAB — URINE MICROSCOPIC-ADD ON

## 2010-07-12 LAB — RETICULOCYTES
RBC.: 4.28 MIL/uL (ref 3.87–5.11)
Retic Count, Absolute: 34.2 10*3/uL (ref 19.0–186.0)
Retic Ct Pct: 0.8 % (ref 0.4–3.1)

## 2010-07-12 LAB — GLUCOSE, CAPILLARY
Glucose-Capillary: 100 mg/dL — ABNORMAL HIGH (ref 70–99)
Glucose-Capillary: 100 mg/dL — ABNORMAL HIGH (ref 70–99)
Glucose-Capillary: 101 mg/dL — ABNORMAL HIGH (ref 70–99)
Glucose-Capillary: 107 mg/dL — ABNORMAL HIGH (ref 70–99)
Glucose-Capillary: 110 mg/dL — ABNORMAL HIGH (ref 70–99)
Glucose-Capillary: 112 mg/dL — ABNORMAL HIGH (ref 70–99)
Glucose-Capillary: 112 mg/dL — ABNORMAL HIGH (ref 70–99)
Glucose-Capillary: 123 mg/dL — ABNORMAL HIGH (ref 70–99)
Glucose-Capillary: 98 mg/dL (ref 70–99)

## 2010-07-12 LAB — URINALYSIS, ROUTINE W REFLEX MICROSCOPIC
Nitrite: POSITIVE — AB
Protein, ur: NEGATIVE mg/dL
Specific Gravity, Urine: 1.01 (ref 1.005–1.030)
Urobilinogen, UA: 0.2 mg/dL (ref 0.0–1.0)

## 2010-07-12 LAB — BASIC METABOLIC PANEL
BUN: 25 mg/dL — ABNORMAL HIGH (ref 6–23)
BUN: 26 mg/dL — ABNORMAL HIGH (ref 6–23)
Calcium: 8.8 mg/dL (ref 8.4–10.5)
Chloride: 101 mEq/L (ref 96–112)
Chloride: 101 mEq/L (ref 96–112)
Creatinine, Ser: 1.42 mg/dL — ABNORMAL HIGH (ref 0.4–1.2)
Creatinine, Ser: 1.5 mg/dL — ABNORMAL HIGH (ref 0.4–1.2)
GFR calc Af Amer: 41 mL/min — ABNORMAL LOW (ref >60)
Glucose, Bld: 99 mg/dL (ref 70–99)
Potassium: 3.4 mEq/L — ABNORMAL LOW (ref 3.5–5.1)

## 2010-07-12 LAB — TSH: TSH: 1.301 u[IU]/mL (ref 0.350–4.500)

## 2010-07-12 LAB — VITAMIN B12: Vitamin B-12: 769 pg/mL (ref 211–911)

## 2010-07-12 LAB — CORTISOL-AM, BLOOD: Cortisol - AM: 10 ug/dL (ref 4.3–22.4)

## 2010-07-12 LAB — HEMOGLOBIN A1C: Mean Plasma Glucose: 117 mg/dL

## 2010-07-12 LAB — CK TOTAL AND CKMB (NOT AT ARMC): CK, MB: 0.9 ng/mL (ref 0.3–4.0)

## 2010-07-12 LAB — FOLATE: Folate: 3.6 ng/mL

## 2010-07-13 ENCOUNTER — Other Ambulatory Visit (INDEPENDENT_AMBULATORY_CARE_PROVIDER_SITE_OTHER): Payer: Medicare Other | Admitting: Family Medicine

## 2010-07-13 ENCOUNTER — Other Ambulatory Visit: Payer: Self-pay | Admitting: Family Medicine

## 2010-07-13 DIAGNOSIS — K279 Peptic ulcer, site unspecified, unspecified as acute or chronic, without hemorrhage or perforation: Secondary | ICD-10-CM

## 2010-07-13 DIAGNOSIS — E119 Type 2 diabetes mellitus without complications: Secondary | ICD-10-CM

## 2010-07-13 LAB — BASIC METABOLIC PANEL
Calcium: 8.5 mg/dL (ref 8.4–10.5)
GFR: 39.07 mL/min — ABNORMAL LOW (ref >60.00)
Glucose, Bld: 85 mg/dL (ref 70–99)
Sodium: 142 mEq/L (ref 135–145)

## 2010-07-13 LAB — CBC WITH DIFFERENTIAL/PLATELET
Eosinophils Absolute: 0.1 10*3/uL (ref 0.0–0.7)
Eosinophils Relative: 2.1 % (ref 0.0–5.0)
HCT: 31.1 % — ABNORMAL LOW (ref 36.0–46.0)
Lymphs Abs: 1.3 10*3/uL (ref 0.7–4.0)
MCHC: 33.5 g/dL (ref 30.0–36.0)
MCV: 87.6 fl (ref 78.0–100.0)
Monocytes Absolute: 0.4 10*3/uL (ref 0.1–1.0)
Platelets: 121 10*3/uL — ABNORMAL LOW (ref 150.0–400.0)
RDW: 15 % — ABNORMAL HIGH (ref 11.5–14.6)

## 2010-07-17 LAB — BASIC METABOLIC PANEL
BUN: 17 mg/dL (ref 6–23)
Creatinine, Ser: 1.24 mg/dL — ABNORMAL HIGH (ref 0.4–1.2)
GFR calc Af Amer: 51 mL/min — ABNORMAL LOW (ref >60)
GFR calc non Af Amer: 42 mL/min — ABNORMAL LOW (ref >60)
Potassium: 4.8 mEq/L (ref 3.5–5.1)

## 2010-07-17 LAB — DIFFERENTIAL
Lymphocytes Relative: 18 % (ref 12–46)
Lymphs Abs: 1 10*3/uL (ref 0.7–4.0)
Neutrophils Relative %: 75 % (ref 43–77)

## 2010-07-17 LAB — CBC
HCT: 36.2 % (ref 36.0–46.0)
Platelets: 125 10*3/uL — ABNORMAL LOW (ref 150–400)
WBC: 5.8 10*3/uL (ref 4.0–10.5)

## 2010-07-17 LAB — SEDIMENTATION RATE: Sed Rate: 10 mm/hr (ref 0–22)

## 2010-07-23 ENCOUNTER — Encounter: Payer: Self-pay | Admitting: Emergency Medicine

## 2010-07-24 ENCOUNTER — Telehealth: Payer: Self-pay | Admitting: *Deleted

## 2010-07-24 ENCOUNTER — Ambulatory Visit (INDEPENDENT_AMBULATORY_CARE_PROVIDER_SITE_OTHER): Payer: Medicare Other | Admitting: Family Medicine

## 2010-07-24 ENCOUNTER — Encounter: Payer: Self-pay | Admitting: Family Medicine

## 2010-07-24 DIAGNOSIS — I509 Heart failure, unspecified: Secondary | ICD-10-CM

## 2010-07-24 DIAGNOSIS — R609 Edema, unspecified: Secondary | ICD-10-CM

## 2010-07-24 DIAGNOSIS — R0602 Shortness of breath: Secondary | ICD-10-CM

## 2010-07-24 DIAGNOSIS — R600 Localized edema: Secondary | ICD-10-CM | POA: Insufficient documentation

## 2010-07-24 DIAGNOSIS — R0789 Other chest pain: Secondary | ICD-10-CM

## 2010-07-24 DIAGNOSIS — I5032 Chronic diastolic (congestive) heart failure: Secondary | ICD-10-CM

## 2010-07-24 LAB — CBC WITH DIFFERENTIAL/PLATELET
Basophils Relative: 0.4 % (ref 0.0–3.0)
Eosinophils Relative: 2 % (ref 0.0–5.0)
Lymphocytes Relative: 27.4 % (ref 12.0–46.0)
Neutrophils Relative %: 64.1 % (ref 43.0–77.0)
RBC: 3.81 Mil/uL — ABNORMAL LOW (ref 3.87–5.11)
WBC: 6.9 10*3/uL (ref 4.5–10.5)

## 2010-07-24 LAB — BRAIN NATRIURETIC PEPTIDE: Pro B Natriuretic peptide (BNP): 363 pg/mL — ABNORMAL HIGH (ref 0.0–100.0)

## 2010-07-24 NOTE — Progress Notes (Signed)
  Subjective:    Patient ID: Donna Reese, female    DOB: September 18, 1931, 77 y.o.   MRN: [id number]  HPI 77 year old complicated female pt of Dr Patsy Lager' who presented as a walk in today with worsening SOB and peripheral swelling. She has a history of Afib, CAD, CHF, COPD on chronic O2, Hx of  PUD. Also history of recurrent anemia with GI bleeds.  1 week of SOB and peripheral swelling.. Worse in last 24 hours. Occ chest pain...2-3 times a day...for 1 week. Occuring spontaneously, not with exertion. Fatigue as well.  On lasix 80 BID.  Cardiologist Dr. Excell Seltzer... Last saw him 3-4 months ago.   BP is high but always up per pt.. On four different meds.  No cough, fever or wheezing.  Hg today 10.9 in office.  Review of Systems  Constitutional: Negative for fever and fatigue.  HENT: Negative for ear pain, nosebleeds, congestion, rhinorrhea, postnasal drip and sinus pressure.   Eyes: Negative for pain.  Respiratory: Positive for shortness of breath. Negative for cough, chest tightness, wheezing and stridor.   Cardiovascular: Positive for chest pain and leg swelling. Negative for palpitations.  Gastrointestinal: Negative for abdominal pain, diarrhea and constipation.  Genitourinary: Negative for dysuria and hematuria.  Skin: Negative for pallor and rash.  Psychiatric/Behavioral: The patient is not nervous/anxious.        Objective:   Physical Exam  Constitutional: She appears well-nourished. No distress.       Fatigued appearing elderly lady, non toxic  HENT:  Head: Normocephalic.  Right Ear: Hearing, tympanic membrane, external ear and ear canal normal.  Left Ear: Hearing, tympanic membrane, external ear and ear canal normal.  Nose: Nose normal.  Mouth/Throat: Mucous membranes are normal. Mucous membranes are not pale and not dry.  Neck: Trachea normal and normal range of motion. JVD present. Carotid bruit is present. No mass and no thyromegaly present.  Cardiovascular: Normal rate,  regular rhythm, S1 normal, S2 normal and normal pulses.  Exam reveals distant heart sounds. Exam reveals no gallop.   No murmur heard.      B 3 plus lower extremity edema  Pulmonary/Chest: Effort normal. She has no decreased breath sounds. She has no wheezes. She has no rhonchi. She has rales in the right lower field and the left lower field.  Abdominal: Soft. Normal appearance and bowel sounds are normal. There is no hepatosplenomegaly. There is no tenderness. There is no CVA tenderness.  Neurological: She has normal strength.       Using 4 pronged cane in office  Skin: Skin is warm and dry. Rash noted. No pallor.       Dry flacky erythematous nummular macules on B lower leg... Consistent with dermatiitis... Pt instructed to use OTC steroid cream BID x 2 week.  Psychiatric: She has a normal mood and affect. Her speech is normal. Her mood appears not anxious. She does not exhibit a depressed mood.          Assessment & Plan:

## 2010-07-24 NOTE — Telephone Encounter (Signed)
Patient came into the office today with her husband who was having lab work and she is complaining of SOB for a few days now.  Her BP was 180/60 left arm, Pulse 70, SpO2 96% on two liters of oxygen.  She stated that she just doesn't feel good and she thinks that may her blood count or hemoglobin is low.

## 2010-07-24 NOTE — Assessment & Plan Note (Addendum)
CBC stable from 1 week ago.. No new anemia. No suggestion of infection or COPD exacerbation.  Pt appears fluid overloaded... So SOB most likely due to CHF exacerbation. Atypical chest pain... Possibly due to fluid overload..ZOX:WRUEAV from last check in 04/2010. Will send for BNP,TSH.  Increase lasix to TID. Elevate legs above heart. Follow up tommorow with primary MD. Go to ER if SOB worsening.

## 2010-07-24 NOTE — Telephone Encounter (Signed)
Patient was seen by Dr. Ermalene Searing today.

## 2010-07-24 NOTE — Patient Instructions (Addendum)
Increase lasix to TID. Elevate legs above heart. Follow up in 2 days with primary MD. Go to ER if SOB worsening.

## 2010-07-25 ENCOUNTER — Encounter: Payer: Self-pay | Admitting: Family Medicine

## 2010-07-25 ENCOUNTER — Ambulatory Visit: Payer: Medicare Other | Admitting: Emergency Medicine

## 2010-07-26 ENCOUNTER — Ambulatory Visit (INDEPENDENT_AMBULATORY_CARE_PROVIDER_SITE_OTHER): Payer: Medicare Other | Admitting: Family Medicine

## 2010-07-26 ENCOUNTER — Ambulatory Visit (HOSPITAL_COMMUNITY): Payer: Medicare Other

## 2010-07-26 ENCOUNTER — Ambulatory Visit (INDEPENDENT_AMBULATORY_CARE_PROVIDER_SITE_OTHER)
Admission: RE | Admit: 2010-07-26 | Discharge: 2010-07-26 | Disposition: A | Discharge status: Home / Self Care | Payer: Medicare Other | Source: Ambulatory Visit | Attending: Family Medicine | Admitting: Family Medicine

## 2010-07-26 ENCOUNTER — Encounter: Payer: Self-pay | Admitting: Family Medicine

## 2010-07-26 VITALS — BP 160/60 | HR 78 | Temp 98.0°F | Ht 65.0 in | Wt 165.0 lb

## 2010-07-26 DIAGNOSIS — I25810 Atherosclerosis of coronary artery bypass graft(s) without angina pectoris: Secondary | ICD-10-CM

## 2010-07-26 DIAGNOSIS — I5032 Chronic diastolic (congestive) heart failure: Secondary | ICD-10-CM

## 2010-07-26 DIAGNOSIS — I10 Essential (primary) hypertension: Secondary | ICD-10-CM

## 2010-07-26 DIAGNOSIS — R609 Edema, unspecified: Secondary | ICD-10-CM

## 2010-07-26 DIAGNOSIS — I5022 Chronic systolic (congestive) heart failure: Secondary | ICD-10-CM

## 2010-07-26 DIAGNOSIS — I509 Heart failure, unspecified: Secondary | ICD-10-CM

## 2010-07-26 DIAGNOSIS — R600 Localized edema: Secondary | ICD-10-CM

## 2010-07-26 DIAGNOSIS — R0602 Shortness of breath: Secondary | ICD-10-CM

## 2010-07-26 DIAGNOSIS — J449 Chronic obstructive pulmonary disease, unspecified: Secondary | ICD-10-CM

## 2010-07-26 DIAGNOSIS — J4489 Other specified chronic obstructive pulmonary disease: Secondary | ICD-10-CM

## 2010-07-26 LAB — BASIC METABOLIC PANEL
BUN: 17 mg/dL (ref 6–23)
Chloride: 102 mEq/L (ref 96–112)
Glucose, Bld: 104 mg/dL — ABNORMAL HIGH (ref 70–99)
Potassium: 4.7 mEq/L (ref 3.5–5.1)

## 2010-07-26 MED ORDER — ISOSORBIDE MONONITRATE ER 30 MG PO TB24: 30.0000 mg | ORAL_TABLET | Freq: Every day | ORAL | Status: DC

## 2010-07-26 MED ORDER — LOSARTAN POTASSIUM 100 MG PO TABS: 100.0000 mg | ORAL_TABLET | Freq: Every day | ORAL | Status: DC

## 2010-07-26 MED ORDER — METOLAZONE 5 MG PO TABS: ORAL_TABLET | ORAL | Status: DC

## 2010-07-26 NOTE — Progress Notes (Signed)
77 yo patient very well known to me with a h/o CAD s/p CABG, CHF, COPD - O2 dependent, HTN, AF, PVD, Renal insufficiency, who presents in f/u chf exac after being working in acutely 2 days ago by Dr. Ermalene Searing for acute LE edema and worsening SOB:  CHF: Decompensated HF 2 days prior, on Lasix 80 mg TID right now - per patient minimal improvement in SOB, minimal improvement in LE edema. Additionally, on Cozaar 50 mg, Toprol XL 50 mg, Imdur, Norvasc 10 mg. 3 pound weight loss compared to 06/14/2010.  She is having shortness of breath at baseline without any physical exertion right now. Now discomfort, more on the right side of her chest. EKG at last office visit was unchanged from baseline.  COPD: on Dulera and Spiriva with 2L O2. Distant history of tobacco abuse.   HTN, increased despite current meds listed above.   Intermittent AF, off anticoag -- multiple severe GI bleeds in the past.  Patient Active Problem List  Diagnoses  . DIABETES MELLITUS, TYPE II  . HYPERLIPIDEMIA  . GOUT, ACUTE  . DEPRESSION  . MIGRAINE WITHOUT AURA  . POLYNEUROPATHY IN DIABETES  . HYPERTENSION  . CAD, AUTOLOGOUS BYPASS GRAFT  . ATRIAL FIBRILLATION  . CHRONIC SYSTOLIC HEART FAILURE  . DIASTOLIC HEART FAILURE, CHRONIC  . RENAL ARTERY STENOSIS  . PERIPHERAL VASCULAR DISEASE  . COPD  . PEPTIC ULCER DISEASE  . RENAL INSUFFICIENCY  . DEGENERATIVE DISC DISEASE  . OSTEOPENIA  . Personal History of Venous Thrombosis and Embolism  . SOB (shortness of breath)  . Peripheral edema   Past Medical History  Diagnosis Date  . Diabetes mellitus   . COPD (chronic obstructive pulmonary disease)   . Diverticulosis   . Osteopenia   . PUD (peptic ulcer disease)   . Peripheral arterial disease   . Hypertension   . Hyperlipidemia   . Diastolic heart failure   . Coronary artery disease   . Renovascular hypertension   . Renal artery stenosis   . Paroxysmal a-fib   . DVT (deep vein thrombosis) in pregnancy   .  Pulmonary embolus   . History of gastrointestinal bleeding    Past Surgical History  Procedure Date  . Appendectomy   . Cholecystectomy   . Abdominal hysterectomy   . Coronary artery bypass graft 09-04-2001    carotid u/s unchnaged 40-50% ICA stenosis 12/1998  . Cataract extraction, bilateral 2001  . Abdominal aortic aneurysm repair 1997  . Nm myoview ltd     neg EF 76%--06/26/2004  . Carotid doppler     12/05 unchanged 9-06  . Cardiac catheterization 09-03-01  . Esophagogastroduodenoscopy     abnormal 2007, esophageal stricture 10-12-03-07 gastritis and major bleef  . Colonoscopy     abnormal 03-13-06  . Vena cava filter placement 07-2007    Dr. Excell Seltzer  . Renal artery doppler 11-17-2008    severe iliac stenosis   History  Substance Use Topics  . Smoking status: Former Smoker -- 1.0 packs/day for 40 years    Types: Cigarettes    Quit date: 04/01/1990  . Smokeless tobacco: Not on file  . Alcohol Use: No   Family History  Problem Relation Age of Onset  . Cancer Mother   . Stroke Father   . Stroke Brother   . Heart attack Brother   . Heart attack Brother   . Heart attack Brother    Allergies  Allergen Reactions  . Codeine   . Meperidine Hcl   .  Morphine   . Niacin     REACTION: rash  . Penicillins   . Propoxyphene Hcl   . Sulfonamide Derivatives    Current Outpatient Prescriptions on File Prior to Visit  Medication Sig Dispense Refill  . amLODipine (NORVASC) 10 MG tablet Take 10 mg by mouth daily.        Marland Kitchen ezetimibe-simvastatin (VYTORIN) 10-20 MG per tablet Take 1 tablet by mouth at bedtime.        . ferrous sulfate 325 (65 FE) MG tablet Take 325 mg by mouth daily with breakfast.        . furosemide (LASIX) 80 MG tablet Take 80 mg by mouth 2 (two) times daily.        Marland Kitchen ketorolac (ACULAR) 0.4 % SOLN Place 1 drop into the right eye 4 (four) times daily.        Marland Kitchen latanoprost (XALATAN) 0.005 % ophthalmic solution Place 1 drop into both eyes daily.        . metoprolol  (TOPROL-XL) 50 MG 24 hr tablet Take 50 mg by mouth daily.        . mirtazapine (REMERON) 15 MG tablet TAKE 1 TABLET BY MOUTH AT BEDTIME  30 tablet  5  . Mometasone Furo-Formoterol Fum (DULERA) 100-5 MCG/ACT AERO Inhale 1 puff into the lungs daily.        . nitroGLYCERIN (NITROSTAT) 0.4 MG SL tablet Place 0.4 mg under the tongue every 5 (five) minutes as needed.        . NON FORMULARY 2 L/min by Nasal route continuous. oxygen       . omeprazole (PRILOSEC) 40 MG capsule Take 40 mg by mouth daily.        . OXYCODONE HCL PO Take 5 mg by mouth daily. As needed for pain       . potassium chloride (KLOR-CON) 10 MEQ CR tablet Take 10 mEq by mouth daily.        Marland Kitchen tiotropium (SPIRIVA) 18 MCG inhalation capsule Place 18 mcg into inhaler and inhale daily.        Marland Kitchen triamcinolone (KENALOG) 0.1 % cream Apply topically 2 (two) times daily.  454 g  1  . DISCONTD: isosorbide mononitrate (IMDUR) 30 MG 24 hr tablet Take 1 tablet (30 mg total) by mouth daily.  30 tablet  11  . DISCONTD: losartan (COZAAR) 50 MG tablet Take 50 mg by mouth 2 (two) times daily.         ROS: GEN: as above. No fever or chills GI: No n/v/d, eating normally Pulm: SOB, no cough, no wheeze CV: as above SOB at rest, able to walk with walker and o2 Minimal LE rash Otherwise, the pertinent positives and negatives are listed above and in the HPI, otherwise a full review of systems has been reviewed and is negative unless noted positive.  GEN: WDWN, NAD, Non-toxic, A & O x 3. Mildly frail appearing female, more distress compared to prior OV. HEENT: Atraumatic, Normocephalic. Neck supple. No masses, No LAD. No JVD Ears and Nose: No external deformity. CV: Irregular. No thrill. No extra heart sounds. PULM: Diminished BS, relatively diffusely without focal crackles. No retractions. No resp. distress. No accessory muscle use. ABD: S, NT, ND, +BS. No rebound tenderness. No HSM.  EXTR: 2-3+ LE edema NEURO Normal gait - with walker. PSYCH:  Normally interactive. Conversant. Not depressed or anxious appearing.  Calm demeanor.   A/P: 1. CHF exacerbation: Minimal improvement despite Lasix increase to TID dosing. Called and  discussed the case with Dr. Excell Seltzer, appreciate him seeing the patient on Monday.  For now, alter Lasix to 120 mg po bid, QID add Zaroxolyn 5 mg 30 mins before Lasix AM dosing, additional 20 meq KCL at that time.  Recheck in Dr. Earmon Phoenix office on Monday.  CXR, AP and Lateral Indication: shortness or breath: Findings: elevation of R hemidiaphragm minimally altered from last CXR, increased pulmonary vascular congestion c/w CHF exac. Evidence of pulmonary edema.  2. Renal insufficiency: recheck BMP. If in significant ARF, will alter POC, will have to admit to HF service.  3. HTN, increase Cozaar to 100 mg daily.

## 2010-07-26 NOTE — Patient Instructions (Signed)
Follow-up with Dr. Excell Seltzer in [facility], Monday at 2 PM.  Change your Lasix dosing: Take 1 1/2 pills twice a day. (equals 120 mg of Lasix twice a day)  Add Metolazone / Zaroxolyn 5 mg YOU ARE ONLY GOING TO TAKE THIS EVERY OTHER DAY, 30 MINUTES BEFORE YOUR LASIX. AT THE SAME TIME YOU DO THIS, TAKE 20 MEQ OF POTASSIUM (2 OF THE PILLS YOU HAVE)  TAKE THE METOLAZONE TODAY (THURSDAY) AND Saturday.  Then follow-up with Dr. Excell Seltzer on Monday.  Blood pressure: Increase your Losartan to 100 mg a day. Until your 50 mg pills run out, take 2 of them a day.  GO TO LAB AND XRAY

## 2010-07-30 ENCOUNTER — Encounter: Payer: Self-pay | Admitting: Cardiovascular Disease

## 2010-07-30 ENCOUNTER — Ambulatory Visit (INDEPENDENT_AMBULATORY_CARE_PROVIDER_SITE_OTHER): Payer: Medicare Other | Admitting: Cardiovascular Disease

## 2010-07-30 ENCOUNTER — Inpatient Hospital Stay (HOSPITAL_COMMUNITY)
Admission: AD | Admit: 2010-07-30 | Discharge: 2010-08-01 | DRG: 293 | Disposition: A | Discharge status: Home / Self Care | Payer: Medicare Other | Source: Ambulatory Visit | Attending: Cardiovascular Disease | Admitting: Cardiovascular Disease

## 2010-07-30 ENCOUNTER — Inpatient Hospital Stay (HOSPITAL_COMMUNITY): Payer: Medicare Other

## 2010-07-30 DIAGNOSIS — I509 Heart failure, unspecified: Principal | ICD-10-CM | POA: Diagnosis present

## 2010-07-30 DIAGNOSIS — E876 Hypokalemia: Secondary | ICD-10-CM | POA: Diagnosis not present

## 2010-07-30 DIAGNOSIS — I5033 Acute on chronic diastolic (congestive) heart failure: Secondary | ICD-10-CM

## 2010-07-30 DIAGNOSIS — N259 Disorder resulting from impaired renal tubular function, unspecified: Secondary | ICD-10-CM

## 2010-07-30 DIAGNOSIS — I4891 Unspecified atrial fibrillation: Secondary | ICD-10-CM

## 2010-07-30 DIAGNOSIS — E785 Hyperlipidemia, unspecified: Secondary | ICD-10-CM | POA: Diagnosis present

## 2010-07-30 DIAGNOSIS — I129 Hypertensive chronic kidney disease with stage 1 through stage 4 chronic kidney disease, or unspecified chronic kidney disease: Secondary | ICD-10-CM | POA: Diagnosis present

## 2010-07-30 DIAGNOSIS — Z882 Allergy status to sulfonamides status: Secondary | ICD-10-CM

## 2010-07-30 DIAGNOSIS — K279 Peptic ulcer, site unspecified, unspecified as acute or chronic, without hemorrhage or perforation: Secondary | ICD-10-CM | POA: Diagnosis present

## 2010-07-30 DIAGNOSIS — I150 Renovascular hypertension: Secondary | ICD-10-CM | POA: Diagnosis present

## 2010-07-30 DIAGNOSIS — I25810 Atherosclerosis of coronary artery bypass graft(s) without angina pectoris: Secondary | ICD-10-CM

## 2010-07-30 DIAGNOSIS — I739 Peripheral vascular disease, unspecified: Secondary | ICD-10-CM | POA: Diagnosis present

## 2010-07-30 DIAGNOSIS — E119 Type 2 diabetes mellitus without complications: Secondary | ICD-10-CM | POA: Diagnosis present

## 2010-07-30 DIAGNOSIS — N183 Chronic kidney disease, stage 3 unspecified: Secondary | ICD-10-CM | POA: Diagnosis present

## 2010-07-30 DIAGNOSIS — Z880 Allergy status to penicillin: Secondary | ICD-10-CM

## 2010-07-30 DIAGNOSIS — Z86718 Personal history of other venous thrombosis and embolism: Secondary | ICD-10-CM

## 2010-07-30 DIAGNOSIS — J449 Chronic obstructive pulmonary disease, unspecified: Secondary | ICD-10-CM | POA: Diagnosis present

## 2010-07-30 DIAGNOSIS — I2510 Atherosclerotic heart disease of native coronary artery without angina pectoris: Secondary | ICD-10-CM | POA: Diagnosis present

## 2010-07-30 DIAGNOSIS — J4489 Other specified chronic obstructive pulmonary disease: Secondary | ICD-10-CM | POA: Diagnosis present

## 2010-07-30 LAB — CBC
HCT: 34.8 % — ABNORMAL LOW (ref 36.0–46.0)
MCV: 86.6 fL (ref 78.0–100.0)
RDW: 13.5 % (ref 11.5–15.5)
WBC: 6.4 10*3/uL (ref 4.0–10.5)

## 2010-07-30 LAB — COMPREHENSIVE METABOLIC PANEL
ALT: 11 U/L (ref 0–35)
AST: 20 U/L (ref 0–37)
Albumin: 3.8 g/dL (ref 3.5–5.2)
Alkaline Phosphatase: 133 U/L — ABNORMAL HIGH (ref 39–117)
BUN: 21 mg/dL (ref 6–23)
Chloride: 96 mEq/L (ref 96–112)
Potassium: 3.4 mEq/L — ABNORMAL LOW (ref 3.5–5.1)
Total Bilirubin: 0.8 mg/dL (ref 0.3–1.2)

## 2010-07-30 LAB — APTT: aPTT: 32 seconds (ref 24–37)

## 2010-07-30 NOTE — Assessment & Plan Note (Signed)
Recent labs reviewed and creatinine was 1.3 milligrams per deciliter. This is stable. We'll continue to watch carefully in the setting of IV diuresis.

## 2010-07-30 NOTE — Progress Notes (Signed)
HPI:  This is a 77 year old woman presenting for follow up evaluation. She has a complex cardiovascular history which includes coronary artery disease status post coronary bypass surgery, extensive peripheral vascular disease with history of AAA repair, renal artery stenosis status post renal stenting, DVT with recurrent bleeding on warfarin and subsequent IVC filter placement, and malignant hypertension with treatment complicated by orthostatic hypotension, chronic diastolic heart failure with last hospitalization November 2011, and paroxysmal atrial fibrillation.  Patient presents with progressive shortness of breath and generalized fatigue. She was seen by Dr. Patsy Lager last week and her diuretics were adjusted. She continues to feel poorly. She really hasn't noted any improvement with increasing Lasix and adding Zaroxolyn. She denies chest pain or pressure. She has chronic two-pillow orthopnea. She notes continued edema of her legs. She also has chronic postural lightheadedness but denies syncope.  Outpatient Encounter Prescriptions as of 07/30/2010  Medication Sig Dispense Refill  . amLODipine (NORVASC) 10 MG tablet Take 10 mg by mouth daily.        Marland Kitchen ezetimibe-simvastatin (VYTORIN) 10-20 MG per tablet Take 1 tablet by mouth at bedtime.        . ferrous sulfate 325 (65 FE) MG tablet Take 325 mg by mouth daily with breakfast.        . furosemide (LASIX) 80 MG tablet Take 80 mg by mouth 2 (two) times daily.        . isosorbide mononitrate (IMDUR) 30 MG 24 hr tablet Take 1 tablet (30 mg total) by mouth daily.  30 tablet  11  . ketorolac (ACULAR) 0.4 % SOLN Place 1 drop into the right eye 4 (four) times daily.        Marland Kitchen latanoprost (XALATAN) 0.005 % ophthalmic solution Place 1 drop into both eyes daily.        Marland Kitchen losartan (COZAAR) 100 MG tablet Take 1 tablet (100 mg total) by mouth daily.  30 tablet  11  . metolazone (ZAROXOLYN) 5 MG tablet Use as directed, 30 minutes before AM lasix dose, every other day.   30 tablet  2  . metoprolol (TOPROL-XL) 50 MG 24 hr tablet Take 50 mg by mouth daily.        . mirtazapine (REMERON) 15 MG tablet TAKE 1 TABLET BY MOUTH AT BEDTIME  30 tablet  5  . Mometasone Furo-Formoterol Fum (DULERA) 100-5 MCG/ACT AERO Inhale 1 puff into the lungs daily.        . nitroGLYCERIN (NITROSTAT) 0.4 MG SL tablet Place 0.4 mg under the tongue every 5 (five) minutes as needed.        . NON FORMULARY 2 L/min by Nasal route continuous. oxygen       . omeprazole (PRILOSEC) 40 MG capsule Take 40 mg by mouth daily.        . OXYCODONE HCL PO Take 5 mg by mouth daily. As needed for pain       . potassium chloride (KLOR-CON) 10 MEQ CR tablet Take 10 mEq by mouth daily.        Marland Kitchen tiotropium (SPIRIVA) 18 MCG inhalation capsule Place 18 mcg into inhaler and inhale daily.        Marland Kitchen triamcinolone (KENALOG) 0.1 % cream Apply topically 2 (two) times daily.  454 g  1    Allergies  Allergen Reactions  . Codeine   . Meperidine Hcl   . Morphine   . Niacin     REACTION: rash  . Penicillins   . Propoxyphene Hcl   .  Sulfonamide Derivatives     Past Medical History  Diagnosis Date  . Diabetes mellitus   . COPD (chronic obstructive pulmonary disease)   . Diverticulosis   . Osteopenia   . PUD (peptic ulcer disease)   . Peripheral arterial disease   . Hypertension   . Hyperlipidemia   . Diastolic heart failure   . Coronary artery disease   . Renovascular hypertension   . Renal artery stenosis   . Paroxysmal a-fib   . DVT (deep vein thrombosis) in pregnancy   . Pulmonary embolus   . History of gastrointestinal bleeding     ROS: Negative except as per HPI  BP 150/50  Pulse 66  Resp 20  Ht 5\' 4"  (1.626 m)  Wt 171 lb 12.8 oz (77.928 kg)  BMI 29.49 kg/m2  PHYSICAL EXAM: Pt is alert and oriented, elderly, frail woman in NAD HEENT: normal Neck: JVP - mildly elevated to 8 cm, carotids 2+= with bilateral bruits Lungs: scattered rhonchi, no rales, good air movement CV: RRR with a  grade 2/6 systolic murmur along the LSB Abd: soft, NT, Positive BS, no hepatomegaly Ext: 1+ right pretibial edema, 2+ left pretibial edema Skin: warm/dry no rash  EKG:  Atrial fibrillation 66 beats per minute, septal infarction age undetermined, otherwise within normal limits.  ASSESSMENT AND PLAN:

## 2010-07-30 NOTE — Patient Instructions (Signed)
Admit to [facility]. 

## 2010-07-30 NOTE — Assessment & Plan Note (Signed)
The patient has acute on chronic diastolic heart failure. Her last echocardiogram from September 2011 demonstrated preserved left ventricular systolic function with an LVEF of 60-65%. Diastolic parameters were consistent with elevated left ventricular filling pressures. Right-sided pressures were also elevated with an estimated right ventricular systolic pressure of 59 mm mercury.  The patient has persistent symptoms of volume overload and dyspnea despite increases in her oral diuretic regimen. She is currently on fairly high doses of diuretics with furosemide 120 mg twice daily and Zaroxolyn 5 mg every other day. She has not appreciated any improvement in her symptoms since she was seen last week. I think she requires hospital admission for intravenous diuresis and close monitoring of her renal function and volume status.

## 2010-07-30 NOTE — Assessment & Plan Note (Signed)
The patient is in atrial fib today. Her heart rate is well controlled. She is not a candidate for systemic anticoagulation secondary to chronic GI blood loss. Will continue current management.

## 2010-07-30 NOTE — Assessment & Plan Note (Signed)
The patient's coronary status appears stable and she has no significant angina. She is not on antiplatelet therapy because of GI bleeding. This has been extensively investigated with endoscopic evaluation. Continue current management.

## 2010-07-31 LAB — BASIC METABOLIC PANEL
BUN: 19 mg/dL (ref 6–23)
CO2: 36 mEq/L — ABNORMAL HIGH (ref 19–32)
Calcium: 8.6 mg/dL (ref 8.4–10.5)
Creatinine, Ser: 1.55 mg/dL — ABNORMAL HIGH (ref 0.4–1.2)
GFR calc Af Amer: 39 mL/min — ABNORMAL LOW (ref >60)
Glucose, Bld: 90 mg/dL (ref 70–99)

## 2010-08-01 LAB — BASIC METABOLIC PANEL
BUN: 28 mg/dL — ABNORMAL HIGH (ref 6–23)
Calcium: 9 mg/dL (ref 8.4–10.5)
Creatinine, Ser: 1.78 mg/dL — ABNORMAL HIGH (ref 0.4–1.2)
GFR calc non Af Amer: 28 mL/min — ABNORMAL LOW (ref >60)
Glucose, Bld: 108 mg/dL — ABNORMAL HIGH (ref 70–99)

## 2010-08-07 ENCOUNTER — Ambulatory Visit: Payer: Medicare Other | Admitting: Cardiovascular Disease

## 2010-08-08 ENCOUNTER — Encounter: Payer: Self-pay | Admitting: Family Medicine

## 2010-08-08 ENCOUNTER — Ambulatory Visit: Payer: Self-pay | Admitting: Family Medicine

## 2010-08-08 ENCOUNTER — Ambulatory Visit (INDEPENDENT_AMBULATORY_CARE_PROVIDER_SITE_OTHER): Payer: Medicare Other | Admitting: Family Medicine

## 2010-08-08 DIAGNOSIS — I5022 Chronic systolic (congestive) heart failure: Secondary | ICD-10-CM

## 2010-08-08 DIAGNOSIS — I5032 Chronic diastolic (congestive) heart failure: Secondary | ICD-10-CM | POA: Insufficient documentation

## 2010-08-08 DIAGNOSIS — N259 Disorder resulting from impaired renal tubular function, unspecified: Secondary | ICD-10-CM

## 2010-08-08 LAB — BASIC METABOLIC PANEL
BUN: 45 mg/dL — ABNORMAL HIGH (ref 6–23)
Calcium: 9.2 mg/dL (ref 8.4–10.5)
Creatinine, Ser: 2.1 mg/dL — ABNORMAL HIGH (ref 0.4–1.2)

## 2010-08-08 MED ORDER — TORSEMIDE 20 MG PO TABS: 20.0000 mg | ORAL_TABLET | Freq: Two times a day (BID) | ORAL | Status: DC

## 2010-08-08 MED ORDER — METOLAZONE 5 MG PO TABS: ORAL_TABLET | ORAL | Status: DC

## 2010-08-08 NOTE — Patient Instructions (Signed)
TORSEMIDE: 20 MG,  Take 2 tablets twice a day  Stop you Lasix  Metolazone 5 mg. Take 1 tablet 30 minutes before your Torsemide dose only on the days where you have gained 3 POUNDS  Recheck 1 month

## 2010-08-08 NOTE — Progress Notes (Signed)
77 year old female, very well known to me, status post hospital discharge on 08/01/2010. She was admitted with a congestive heart failure exacerbation, worsening shortness or breath and failure to improve with outpatient management.  The patient also has significant COPD, O2 dependent. Significant history for renal insufficiency which has been worsening in the acute setting over the last month, and additionally she has multiple medical problems noted below.  Generally, she does not feel that well. She is eating poorly. She is not drinking that well. Overall, her shortness of breath is improved and her swelling is improved compared to her examination prior to hospital admission. While in the hospital, she was changed to torsemide 40 mg by mouth twice a day and Zaroxolyn 5 mg when she has gained 3 pounds. However, I am not clear that the patient fully understood these instructions. And am not clear if she has been taking her torsemide in addition to her Lasix or supplemented or stopped her Lasix. She is not clear, but we did confirm that she has picked up her torsemide.    Chemistry      Component Value Date/Time   NA 140 08/08/2010 0948   K 4.0 08/08/2010 0948   CL 91* 08/08/2010 0948   CO2 37* 08/08/2010 0948   BUN 45* 08/08/2010 0948   CREATININE 2.1* 08/08/2010 0948      Component Value Date/Time   CALCIUM 9.2 08/08/2010 0948   ALKPHOS 133* 07/30/2010 1801   AST 20 07/30/2010 1801   ALT 11 07/30/2010 1801   BILITOT 0.8 07/30/2010 1801      Patient Active Problem List  Diagnoses  . DIABETES MELLITUS, TYPE II  . HYPERLIPIDEMIA  . GOUT, ACUTE  . DEPRESSION  . MIGRAINE WITHOUT AURA  . POLYNEUROPATHY IN DIABETES  . HYPERTENSION  . CAD, AUTOLOGOUS BYPASS GRAFT  . ATRIAL FIBRILLATION  . CHRONIC SYSTOLIC HEART FAILURE  . RENAL ARTERY STENOSIS  . PERIPHERAL VASCULAR DISEASE  . COPD  . PEPTIC ULCER DISEASE  . RENAL INSUFFICIENCY  . DEGENERATIVE DISC DISEASE  . OSTEOPENIA  . Personal History of  Venous Thrombosis and Embolism  . SOB (shortness of breath)  . Peripheral edema  . Diastolic heart failure   Past Medical History  Diagnosis Date  . Diabetes mellitus   . COPD (chronic obstructive pulmonary disease)   . Diverticulosis   . Osteopenia   . PUD (peptic ulcer disease)   . Peripheral arterial disease   . Hypertension   . Hyperlipidemia   . Diastolic heart failure   . Coronary artery disease   . Renovascular hypertension   . Renal artery stenosis   . Paroxysmal a-fib   . DVT (deep vein thrombosis) in pregnancy   . Pulmonary embolus   . History of gastrointestinal bleeding    Past Surgical History  Procedure Date  . Appendectomy   . Cholecystectomy   . Abdominal hysterectomy   . Coronary artery bypass graft 09-04-2001    carotid u/s unchnaged 40-50% ICA stenosis 12/1998  . Cataract extraction, bilateral 2001  . Abdominal aortic aneurysm repair 1997  . Nm myoview ltd     neg EF 76%--06/26/2004  . Carotid doppler     12/05 unchanged 9-06  . Cardiac catheterization 09-03-01  . Esophagogastroduodenoscopy     abnormal 2007, esophageal stricture 10-12-03-07 gastritis and major bleef  . Colonoscopy     abnormal 03-13-06  . Vena cava filter placement 07-2007    Dr. Excell Seltzer  . Renal artery doppler 11-17-2008  severe iliac stenosis   History  Substance Use Topics  . Smoking status: Former Smoker -- 1.0 packs/day for 40 years    Types: Cigarettes    Quit date: 04/01/1990  . Smokeless tobacco: Not on file  . Alcohol Use: No   Family History  Problem Relation Age of Onset  . Cancer Mother   . Stroke Father   . Stroke Brother   . Heart attack Brother   . Heart attack Brother   . Heart attack Brother    Allergies  Allergen Reactions  . Codeine   . Meperidine Hcl   . Morphine   . Niacin     REACTION: rash  . Penicillins   . Propoxyphene Hcl   . Sulfonamide Derivatives    Current Outpatient Prescriptions on File Prior to Visit  Medication Sig Dispense  Refill  . amLODipine (NORVASC) 10 MG tablet Take 10 mg by mouth daily.        Marland Kitchen ezetimibe-simvastatin (VYTORIN) 10-20 MG per tablet Take 1 tablet by mouth at bedtime.        . ferrous sulfate 325 (65 FE) MG tablet Take 325 mg by mouth daily with breakfast.        . isosorbide mononitrate (IMDUR) 30 MG 24 hr tablet Take 1 tablet (30 mg total) by mouth daily.  30 tablet  11  . ketorolac (ACULAR) 0.4 % SOLN Place 1 drop into the right eye 4 (four) times daily.        Marland Kitchen latanoprost (XALATAN) 0.005 % ophthalmic solution Place 1 drop into both eyes daily.        Marland Kitchen losartan (COZAAR) 100 MG tablet Take 1 tablet (100 mg total) by mouth daily.  30 tablet  11  . metoprolol (TOPROL-XL) 50 MG 24 hr tablet Take 50 mg by mouth daily.        . mirtazapine (REMERON) 15 MG tablet TAKE 1 TABLET BY MOUTH AT BEDTIME  30 tablet  5  . Mometasone Furo-Formoterol Fum (DULERA) 100-5 MCG/ACT AERO Inhale 1 puff into the lungs daily.        . nitroGLYCERIN (NITROSTAT) 0.4 MG SL tablet Place 0.4 mg under the tongue every 5 (five) minutes as needed.        . NON FORMULARY 2 L/min by Nasal route continuous. oxygen       . omeprazole (PRILOSEC) 40 MG capsule Take 40 mg by mouth daily.        . OXYCODONE HCL PO Take 5 mg by mouth daily. As needed for pain       . potassium chloride (KLOR-CON) 10 MEQ CR tablet Take 10 mEq by mouth daily.        Marland Kitchen tiotropium (SPIRIVA) 18 MCG inhalation capsule Place 18 mcg into inhaler and inhale daily.        Marland Kitchen triamcinolone (KENALOG) 0.1 % cream Apply topically 2 (two) times daily.  454 g  1  . DISCONTD: furosemide (LASIX) 80 MG tablet Take 80 mg by mouth 2 (two) times daily.        Marland Kitchen DISCONTD: metolazone (ZAROXOLYN) 5 MG tablet Use as directed, 30 minutes before AM lasix dose, every other day.  30 tablet  2   ROS: No chest pain. Shortness of breath at baseline, but improved compared to before. Edema is improved. General he feels weak and fatigued. Walking with a walker without that much  significant acute worsening. Notable for worse compared to being able to walk with a cane previously.  GEN: WDWN, mildly ill, Non-toxic, A & O x 3 HEENT: Atraumatic, Normocephalic. Neck supple. Ears and Nose: No external deformity. CV: RRR, 3/6 SEM. No JVD. No thrill. No extra heart sounds. PULM: Crackles at R lung base continue. No retractions. No resp. distress. No accessory muscle use. ABD: S, NT, ND, +BS. No rebound tenderness. No HSM.  EXTR: minimal edema NEURO Normal gait.  PSYCH: Normally interactive. Conversant. Not depressed or anxious appearing.  Calm demeanor.   A/P: 1. Renal failure: Acute on chronic kidney disease. Baseline creatinine between 1.5-1.7. Now, elevated to 2.1 today. Hold torsemide dosing tonight. Take one torsemide tablet in the morning, but hold evening torsemide dose and recheck on Friday. I discussed the case with my partner Dr. Para March who will evaluate. 2. CHF: Class IV symptoms, but clinically improved compared to last office visit. This will be a challenging case moving forward, balancing appropriate diuresis with maintaining renal integrity.

## 2010-08-10 ENCOUNTER — Encounter: Payer: Self-pay | Admitting: Family Medicine

## 2010-08-10 ENCOUNTER — Ambulatory Visit (INDEPENDENT_AMBULATORY_CARE_PROVIDER_SITE_OTHER): Payer: Medicare Other | Admitting: Family Medicine

## 2010-08-10 ENCOUNTER — Other Ambulatory Visit (INDEPENDENT_AMBULATORY_CARE_PROVIDER_SITE_OTHER): Payer: Medicare Other | Admitting: Family Medicine

## 2010-08-10 DIAGNOSIS — I5022 Chronic systolic (congestive) heart failure: Secondary | ICD-10-CM

## 2010-08-10 DIAGNOSIS — N259 Disorder resulting from impaired renal tubular function, unspecified: Secondary | ICD-10-CM

## 2010-08-10 LAB — BASIC METABOLIC PANEL
BUN: 50 mg/dL — ABNORMAL HIGH (ref 6–23)
Calcium: 8.9 mg/dL (ref 8.4–10.5)
GFR: 21.52 mL/min — ABNORMAL LOW (ref >60.00)
Glucose, Bld: 95 mg/dL (ref 70–99)
Potassium: 4.4 mEq/L (ref 3.5–5.1)

## 2010-08-10 NOTE — Progress Notes (Signed)
CHF fu.  She held one dose of torsemide as directed.  Off zaroxolyn as directed.  No sig inc in edema.  SOB at baseline.  "I don't feel well," but this appears to be near baseline.  She is improved from her status during the hospitalization.  Still on O2. No current CP.  She had some CP earlier in the week that responded to tums.  No fevers.    Meds, vitals, and allergies reviewed.   ROS: See HPI.  Otherwise, noncontributory.  Nad, no inc in wob On O2, using walker. Pleasant in conversation Neck supple rrr with murmur that radiates to carotids. ctab superiorly but with faint crackles at the bases bilaterally Ext with trace edema

## 2010-08-10 NOTE — Progress Notes (Signed)
Addended by: Melody Comas on: 08/10/2010 09:32 AM   Modules accepted: Orders

## 2010-08-10 NOTE — Assessment & Plan Note (Signed)
Doesn't appear acutely decompensated.  Will check BMET and notify pt with plan for outpatient fu next week.  She agrees.  No change in meds now.

## 2010-08-10 NOTE — Patient Instructions (Signed)
Don't change your meds for now.  We'll get your labs and then notify you.  I want you to plan on seeing Dr. Patsy Lager next week.  Take care.

## 2010-08-14 ENCOUNTER — Encounter: Payer: Self-pay | Admitting: Internal Medicine

## 2010-08-14 NOTE — Consult Note (Signed)
Donna Reese, ROBINS NO.:  [id number]   MEDICAL RECORD NO.:  [id number]          PATIENT TYPE:  INP   LOCATION:  3701                         FACILITY:  [facility]   PHYSICIAN:  Pricilla Riffle, MD, FACCDATE OF BIRTH:  Jun 09, 1931   DATE OF CONSULTATION:  07/02/2007  DATE OF DISCHARGE:                                 CONSULTATION   IDENTIFICATION:  Ms. Setzer is a 77 year old who is followed by Dr.  Charlies Constable.  We were asked to see regarding chest pain.   HISTORY OF PRESENT ILLNESS:  The patient was admitted on 06/26/2007.  She complained then of left-sided chest pain, called the office and told  to take a nitroglycerin.  The pain radiated to her arm and her jaw, like  her angina.  She continued to have pain.  She presented to the emergency  room there.  Her hemoglobin was noted to be 6.9.  She was admitted to  the medicine service.  Note, her BNP was also mildly elevated at 206 and  chest x-ray showed mild edema.  INR at the time was 2.8.  She was  transfused with 2 units the PRBCs and Lasix was increased to 60 p.o.  b.i.d.   The patient since admission has undergone EGD that showed one small 8  AVM in the jejunum, which was ablated.  She has now been transfused a  total of 4 units PRBCs and is currently undergoing a 2 unit transfusion  for a total of 6.   Yesterday, she complained of some chest pain.  This was in the evening.  It was treated with nitroglycerin.  Telemetry suggested atrial  fibrillation, but EKG does not confirm this.  Today, she had an episode  again of chest pressure.  She notes it as a dull substernal chest  pressure.  It is now gone.  She said she stays short of breath and notes  no change even with the transfusion.   ALLERGIES:  1. CODEINE.  2. DARVOCET.  3. DEMEROL.  4. MORPHINE.  5. PENICILLIN.  6. SULFA.   PAST MEDICAL HISTORY:  1. CHF, felt to be diastolic.  Echocardiogram in January 2008, shows      an LVEF of 60-65% with  grade 1 diastolic dysfunction, mild MR, PAP      of 55.  Note, the patient was admitted in January for heart      failure, felt to be exacerbated by GI bleed.  2. CAD.  She is status post CABG.  Her catheterization prior to this      was in 2003.  She had a 90% left main, 90% ostial LAD, circumflex      and ramus were normal.  RCA had a 30%.  She underwent a LIMA to the      LAD and a radial artery to the left circumflex.  He was going to      have a cardiac catheterization in 12/2005, however, she was found      to have occlusion of her aorta, as well as her left iliac, and she  had diffuse disease in the right iliac.  The procedure was aborted      because her INR was 1.86.  3. Peripheral vascular disease.  As noted above, she is status post      AAA repair and then resultant occlusion.  4. History of atrial fibrillation.  5. History of pulmonary embolus, felt secondary to DVT.  6. History of anemia secondary to GI bleeding.  7. History of hypertension  8. Dyslipidemia.  9. Gout.  10.Diabetes.   CURRENT MEDICATIONS:  1. Norvasc 10.  2. Cozaar 100.  3. Neurontin 300.  4. Normodyne 200 b.i.d.  5. Zoloft 50.  6. Zocor 20.  7. Protonix 40.  8. Lasix 60 p.o. b.i.d.  9. Cipro 250 q.8.  10.Iron 324 t.i.d.  11.Vitamin C b.i.d.   SOCIAL HISTORY:  The patient does not smoke, quit in 1992, does not  drink.  She is married.   FAMILY HISTORY:  Significant for CAD in two brothers.   REVIEW OF SYSTEMS:  The patient had a Myoview scan in 2007, showed mild  anterior ischemia.  Medical therapy was used.  The patient notes melena  prior to this admission.  Otherwise, all systems reviewed and negative  except as noted.   PHYSICAL EXAMINATION:  GENERAL:  Currently the patient denies chest  pain.  No shortness of breath at rest.  VITAL SIGNS:  Temperature is 98.2, blood pressure 142-154/45-56, pulse  is 50s to 60s, sinus rhythm.  Telemetry, sinus rhythm with PACs.  HEENT:   Normocephalic, atraumatic.  EOMI.  PERRL.  Mucous membranes  moist.  NECK:  JVP is increased approximately 10-11.  Positive bruits  bilaterally.  LUNGS:  Relatively clear.  CARDIAC:  Regular rate and rhythm.  S1-S2.  Grade 2/6 systolic murmur  heard best at left sternal border, radiating to the base.  No S3.  ABDOMEN:  No hepatomegaly.  Supple.  Normal bowel sounds.  EXTREMITIES:  Trace edema.  Trace posterior tibial.  Leg slightly cool.   A 12-lead EKG on June 30, 2097, shows sinus rhythm with PACs, rate 66.  On June 30, 2007, sinus tach 118, LVH, J-point elevation V1-V3.  On  June 27, 2007, sinus rhythm 81, ST depression I, II, aVL, V4-V6 (this  was present in January 2009).  Chest x-ray on June 26, 2007, shows  cardiomegaly with interstitial edema.  Last hemoglobin today was 8.8,  yesterday was 9.4.  BNP on June 27, 2007, was 500.  BMET on June 27, 2007, showed a sodium of 143, potassium of 3.4, chloride of 103, CO2 of  31, BUN and creatinine of 38 and 1.  TSH is 1.8.  CK-MB have been  negative x3 on June 27, 2007.  Troponin 0.09; 0.10; 0.09 (328).   IMPRESSION:  The patient is a 77 year old with multiple medical  problems, including coronary artery disease, peripheral vascular  disease, atrial fibrillation.  She presented with chest pain on June 29, 2007.  Hemoglobin found to be 6.9.  CK-MB was normal.  Peak troponin  was 0.10, probably secondary to strain with anemia, mild congestive  heart failure on admit.   She was admitted to the medicine service, seen by gastroenterology.  She  has now received a total of 6 units transfusion as hemoglobin continues  to trickle down.  Endoscopy showed arteriovenous malformation in the  jejunum.  Capsule window is pending.  It was done, but not read out.  Patient with chest pain yesterday, and today intermittent.  EKG with no  new changes.  On examination, some volume increase, note the patient  getting blood, otherwise  negative.   RECOMMENDATIONS:  Limited to what can be done, medical therapy only now  with bleeding.  No aspirin.  No Coumadin.  Continue Normodyne 200 b.i.d.  Heart rate limiting increase.  Continue Norvasc.  Continue Cozaar.  Would diurese more with Lasix between units, 60 IV x1.  Add Imdur 30 to  regimen.  Continue telemetry for atrial fibrillation.  Discussed risks  of CVA, bleeding with the patient.  Repeat BMET, BNP.  With PE history,  will arrange IVC filter for tomorrow.  Check lipids in a.m.      Pricilla Riffle, MD, [facility]  Electronically Signed     PVR/MEDQ  D:  07/02/2007  T:  07/03/2007  Job:  [id number]

## 2010-08-14 NOTE — Assessment & Plan Note (Signed)
[facility]                            CARDIOLOGY OFFICE NOTE   LODEMA, PARMA                      MRN:          [id number]  DATE:08/03/2007                            DOB:          [birth year]    PRIMARY CARE PHYSICIAN:  Atha Starks. Bean, FNP at [facility].   CLINICAL HISTORY:  Donna Reese returned for followup and management of  her coronary heart disease and congestive heart failure after her recent  hospitalization for recurrent GI bleeding.  She has had recurrent GI  bleeding that has been worked up and has been felt to be due to AV  malformations.  We tried her back on Coumadin for her paroxysmal atrial  fibrillation and a history of previous DVT and pulmonary embolism but  she bled again quite profoundly requiring multiple transfusions.  Her  admissions were associated with both congestive heart failure and  angina.   She has had prior bypass surgery and she has a history of diastolic  heart failure and hypertension.   Since her discharge from the hospital, she said she has felt somewhat  better.  She has had some increased edema and she has had some  intermittent chest pain which is not usually related to exertion.  She  is not taking nitroglycerin.   PAST MEDICAL HISTORY:  Significant for the problems listed below.   CURRENT MEDICATIONS:  1. Hematogen.  2. Lasix 80 mg b.i.d.  3. Metformin.  4. Labetalol 200 mg b.i.d.  5. Cozaar 100 mg daily.  6. Amlodipine 10 mg daily.  7. Gabapentin.  8. Isosorbide 30 mg daily.  9. Simvastatin.  10.Xalatan.   PHYSICAL EXAMINATION:  Blood pressure is 164/52 and the pulse 68 and  regular.  Venous pulsations were visible 2 cm above the clavicle.  The  carotid pulses were full with bilateral bruits.  CHEST:  Clear. The cardiac rhythm was regular. There was a short  systolic murmur in the left sternal edge.  ABDOMEN:  Protuberant.  Soft with normal bowel sounds and there was no  hepatosplenomegaly. There was nonpitting edema in the lower extremities  and 1+ pitting edema.  The pedal pulses were equal.   Electrocardiogram showed sinus rhythm.   IMPRESSION:  1. Recent recurrent gastrointestinal bleed from AV malformations - no      longer a Coumadin candidate.  2. Coronary artery disease status post prior coronary bypass graft      surgery.  3. Diastolic heart failure with mild volume overload.  4. Paroxysmal atrial fibrillation.  5. History of deep vein thrombophlebitis and pulmonary embolism with      recent IVC filter placement after discontinuation of Coumadin.  6. Hypertension.  7. Hyperlipidemia.  8. Diabetes.  9. Status post abdominal aneurysm repair.   RECOMMENDATIONS:  I think Ms. Laverne has some mild volume overload and  she still has recurrent chest pain.  I am not sure if the chest pain is  angina but I am concerned it could be.  Her last catheterization had  difficulty with access and because her INR was 1.8 Dr. Excell Seltzer elected  not to proceed further. Will plan to evaluate her further with an  adenosine rest stress Myoview scan.  Will increase her labetalol from  200-300 twice a day for her blood pressure and also increase her Lasix  from 80 twice a day to 120 in the morning and 80 in the afternoon for  her fluid.  I will ask her to see Billie Bean in the next couple of  weeks for follow-up on her blood pressure and I will see her back in 3  months or sooner if her scan is abnormal.     Bruce R. Juanda Chance, MD, [facility]  Electronically Signed    BRB/MedQ  DD: 08/03/2007  DT: 08/03/2007  Job #: [id number]

## 2010-08-14 NOTE — Discharge Summary (Signed)
Donna Reese, Donna Reese NO.:  [id number]   MEDICAL RECORD NO.:  [id number]          PATIENT TYPE:  INP   LOCATION:  3738                         FACILITY:  [facility]   PHYSICIAN:  Veverly Fells. Excell Seltzer, MD  DATE OF BIRTH:  10/16/1931   DATE OF ADMISSION:  07/18/2008  DATE OF DISCHARGE:  07/21/2008                               DISCHARGE SUMMARY   PRIMARY CARDIOLOGIST:  Everardo Beals. Juanda Chance, MD, [facility]   PRIMARY CARE:  Atha Starks. Bean, FNP, nurse practitioner at [facility]   DISCHARGING DIAGNOSES:  1. Chest pain with no coronary artery disease.  No further workup at      this time.  The patient ruled out for myocardial infarction with      serial enzymes with recommendations to continue medical therapy for      coronary artery disease.  2. Malignant hypertension with marked orthostasis, this has resulted      in multiple hospitalizations for the patient.  She currently has a      reasonable balance and readings, symptoms have improved.  At time      of discharge, we will continue her Toprol, losartan, Lasix, Imdur,      Norvasc, and midodrine at a low-dose.  3. Acute-on-chronic diastolic heart failure, stable at time of      discharge.   PAST MEDICAL HISTORY:  1. Diastolic congestive heart failure, most recent echocardiogram in      January 2010, showed mild LV hypertrophy with normal LV systolic      function, EF of 40% with moderate diastolic dysfunction with      evidence for elevated LV filling pressures, moderate biatrial      enlargement with moderate pulmonary hypertension, likely secondary      to LV dysfunction.  2. Coronary artery disease.  2A.  Status post CABG in 2003, most recent cardiac catheterization in  August 2009, with a 100% left main and 50% right coronary artery with  patent stents.  1. Paroxysmal atrial fib, not a Coumadin candidate.  2. History of GI bleeds.  3. Frequent falls.  4. Malignant hypertension with marked orthostasis.  5. History of  DVT, status post IVC filter.  6. Peripheral arterial disease.  8A.  Renal artery stenosis, status post renal artery stent on the left  in 2009.  8B.  AAA repair.  1. Hyperlipidemia.  2. Hypertension.  3. Diet-controlled diabetes.  4. Anemia of chronic disease.  5. History of Klebsiella urinary tract infections.  6. Chronic kidney disease, stage III.  7. Peripheral neuropathy.   HOSPITAL COURSE:  Donna Reese is a 77 year old Caucasian female with  past medical history as stated above who was just discharged here on  July 04, 2008, who returns this admission for recurrent falls with  orthostatic hypotension, otherwise with hypertension.  The patient  currently in skilled nursing facility for rehabilitation, status post  recent hospitalization when the patient began having substernal  squeezing and burning in her throat radiating up into her back, blood  pressure was checked at 189/58, EMS was notified secondary to ongoing  chest discomfort.  The patient also complained of some nausea.  She was  treated with nitroglycerin by EMS, also given a GI cocktail in the  emergency room at [facility].  The GI cocktail seemed to be most  beneficial.  Blood pressure stabilized in the emergency room at 124/92.  The patient was seen by Joni Reining and Dr. Tenny Craw.  Plan is to admit  the patient for 24-hour observation, cycle cardiac enzymes, and diurese.  Midodrine was decreased to 2.5 mg.  The patient continued to have  fluctuations in blood pressure.  Medications were fine-tuned.  No  further episodes of chest discomfort.  The patient's room blood pressure  remained stable after adjustments in medication.  Case management  consulted for return to skilled nursing facility, but the patient opted  for returning home with [facility] health [facility] from [facility].  Dr. Excell Seltzer  in to see the patient.  On day of discharge, the patient is stable.  Blood pressure 157/49 and heart rate in the 50s.  The patient  being  discharged home.  Follow up with Dr. Juanda Chance as scheduled for August 30, 2008, at 2:15.   DISCHARGE MEDICATIONS:  At time of discharge changes in medication  include:  1. Lasix 40 mg b.i.d. now.  2. Imdur 30 mg daily is new.  3. Norvasc 5 mg daily is new.  4. Midodrine has been decreased to 2.5 mg t.i.d.   Resume previous medications including:  1. Cozaar 50 mg daily.  2. Toprol-XL 25 daily.  3. Plavix 75 daily.  4. Aspirin 81.  5. Vytorin 10/40.  6. Gabapentin 300 mg t.i.d.  7. Zoloft 100 mg daily.   The patient may resume her other medications per the medication  reconsolidation form including:  1. MiraLax 17 g as needed.  2. Xalatan eye drops 1 drop each eye at bedtime.  3. Vitamin supplements as previously taken.  4. Iron sulfate as previously taken.   DURATION OF DISCHARGE ENCOUNTER:  Well over 30 minutes.      [name], ACNP      Veverly Fells. Excell Seltzer, MD  Electronically Signed    MB/MEDQ  D:  07/21/2008  T:  07/22/2008  Job:  [id number]

## 2010-08-14 NOTE — Assessment & Plan Note (Signed)
[facility]                            CARDIOLOGY OFFICE NOTE   Donna, Reese                      MRN:          [id number]  DATE:10/30/2007                            DOB:          29-Jul-1931    PRIMARY CARE PHYSICIAN:  Atha Starks. Bean, FNP.   CLINICAL HISTORY:  Donna Reese returned for a followup management of  coronary heart disease and diastolic heart failure.  She had been  hospitalized earlier this year with a GI bleed related to AV  malformations and we felt she was no longer a Coumadin candidate.  She  had been put on Coumadin for paroxysmal atrial fibrillation and a  previous history of deep vein thrombophlebitis and pulmonary embolism.  She had a filter placed during that admission.   From a cardiac standpoint, she had been doing fair.  She still has some  chest pains that she describes as left-sided and nonexertional.  She  does get short of breath with exertion.  She has gained some weight and  since she has just a little bit of fluid in her legs.   PAST MEDICAL HISTORY:  Significant for diabetes, hypertension, and  hyperlipidemia.   CURRENT MEDICATIONS:  1. Gabapentin.  2. Furosemide 80 mg one and half in the morning one in the afternoon.  3. Potassium 20 mEq two a day.  4. Metformin.  5. Labetalol 200 one and half b.i.d.  6. Sertraline.  7. Isosorbide.  8. Amlodipine 10 mg daily.  9. Simvastatin 40 mg daily.  10.Cozaar 50 mg daily.   PHYSICAL EXAMINATION:  VITAL SIGNS:  Today, the blood pressure was  153/58 and the pulse 64 and regular.  NECK:  I felt there was no venous distension.  The carotid pulses were  full, but there were bilateral loud bruits.  CHEST:  Clear without rales or rhonchi.  HEART:  Rhythm was regular.  ABDOMEN:  Soft, normal bowel sounds.  EXTREMITIES:  Peripheral pulses were full.  There is trace peripheral  edema.   An electrocardiogram showed nonspecific ST-T change.   IMPRESSION:  1.  Coronary heart disease status post prior coronary bypass graft      surgery.  2. Diastolic heart failure close to euvolemic now.  3. Paroxysmal atrial fibrillation, not a Coumadin candidate.  4. Recurrent gastrointestinal bleed related to arteriovenous      malformation.  5. History of deep vein thrombophlebitis and pulmonary embolism with      inferior vena cava filter placed during recent admission.  6. Hypertension.  7. Hyperlipidemia.  8. Diabetes.  9. Status post abdominal aneurysm repair.   RECOMMENDATIONS:  Donna Reese' chest pain sounds very atypical for  angina, I think it is not likely angina and more likely musculoskeletal.  I think diastolic heart failure appears to be fairly well compensated.  We will plan to get a lipid and liver, CBC, and BMP on her today.  We  will decide about adjusting her medications depending on the results of  those, and I will plan to see her back in followup in 6 months.  Bruce Elvera Lennox Juanda Chance, MD, [facility]  Electronically Signed    BRB/MedQ  DD: 10/30/2007  DT: 10/30/2007  Job #: [id number]

## 2010-08-14 NOTE — Assessment & Plan Note (Signed)
[facility]                          CHRONIC HEART FAILURE NOTE   AVANELL, BANWART                      MRN:          [id number]  DATE:05/20/2008                            DOB:          Jun 23, 1931    PRIMARY CARDIOLOGIST:  Everardo Beals. Juanda Chance, MD, [facility]   PRIMARY CARE PHYSICIAN:  Atha Starks. Bean, FNP, at [facility].   [name] returns today for further followup of her congestive  heart failure secondary to diastolic dysfunction.  I saw her back in  January and made adjustments in her medications.  I also prescribed  compression stockings, which she has obtained and is wearing today.  She  responded beautifully to the diuretic adjustments.  Her weight is down  22 pounds today.  She states she is breathing much easier and feels good  today.  She apparently had a renal ultrasound done that showed some  abnormalities and has pending ABIs this afternoon ordered per Dr.  Excell Seltzer, as she continues to have symptoms suggestive of claudication.  Otherwise, Shanicka denies any orthopnea, PND, any episodes of chest  discomfort, lightheadedness, or dizziness.  Most recent echocardiogram  obtained in January showed a normal EF with mild LVH, moderate diastolic  dysfunction, and moderate biatrial enlargement.   PAST MEDICAL HISTORY:  1. Congestive heart failure secondary to diastolic dysfunction with a      normal EF.  2. Coronary artery disease status post bypass surgery in 2003.  3. History of GI bleeding secondary to AV malformation.  4. Paroxysmal atrial fib, not a Coumadin candidate.  5. History of DVT and pulmonary emboli status post IVC filter      placement.  6. AAA repair.  7. Dyslipidemia.  8. Hypertension.  9. Diabetes.  10.Obesity.  11.Renal artery stenosis status post stenting of the left renal artery      by Dr. Excell Seltzer.  12.Status post cardiac catheterization in 2009 showed patent grafts.   REVIEW OF SYSTEMS:  As  stated above, otherwise negative.   CURRENT MEDICATIONS:  1. Gabapentin 300 t.i.d.  2. Tablet [place] wall 300 t.i.d.  3. Klor-Con 20 daily.  4. Zoloft 50 daily.  5. Simvastatin 40 daily.  6. Plavix 75.  7. Labetalol 200 t.i.d.  8. Metformin 500 daily.  9. Vitamin B12 daily.  10.Aspirin 81.  11.Iron 65.  12.Isosorbide 30.  13.Cozaar 50 b.i.d.  14.Lasix 120 in the morning 80 in the evening.   P.R.N. MEDICATIONS:  1. Nitroglycerin.  2. Hydrocodone.  3. Zaroxolyn 2.5 mg.   PHYSICAL EXAMINATION:  VITAL SIGNS:  Weight 175 pounds, weight is down  22 pounds.  Blood pressure 125/63 with a heart rate of 59.  GENERAL:  Ms. [name] is in no acute distress.  No signs of jugular vein  distention at 45-degrees angle.  LUNGS:  Clear to auscultation bilaterally.  CARDIOVASCULAR:  S1 and S2, 2/6 systolic ejection murmur noted.  ABDOMEN:  Obese, soft, and nontender.  EXTREMITIES:  Lower extremities with +1 nonpitting edema bilaterally.  The patient has compression stockings.   IMPRESSION:  Congestive heart failure secondary  to diastolic dysfunction  with significant improvement of fluid volume status.  We will continue  Lasix at 80 mg b.i.d. for now.  Ms. Dearing to follow up with Dr. Juanda Chance  for routine Cardiology visit next month.  She has pending ABIs this  afternoon per Dr. Earmon Phoenix request.  I will be glad to see Ms. Kuehl  back in followup after she sees Dr. Juanda Chance next month.      [name], ACNP  Electronically Signed      Rollene Rotunda, MD, [facility]  Electronically Signed   MB/MedQ  DD: 05/20/2008  DT: 05/21/2008  Job #: [id number]

## 2010-08-14 NOTE — Discharge Summary (Signed)
Donna Reese, Donna Reese NO.:  [id number]   MEDICAL RECORD NO.:  [id number]          PATIENT TYPE:  INP   LOCATION:  3701                         FACILITY:  [facility]   PHYSICIAN:  Cordelia Pen, NP  DATE OF BIRTH:  1932/01/02   DATE OF ADMISSION:  06/26/2007  DATE OF DISCHARGE:  07/03/2007                               DISCHARGE SUMMARY   DISCHARGE DIAGNOSES:  1. Acute blood loss anemia secondary to arteriovenous malformation.  2. Paroxysmal atrial fibrillation, on chronic anticoagulation prior to      this admission.  3. Escherichia coli urinary tract infection.  4. History of angina with continued chest pain.  5. Acute on chronic diastolic heart failure.   HISTORY OF PRESENT ILLNESS:  Donna Reese is a 77 year old white female  with past medical history of CAD and chronic anemia thought secondary to  AV malformations.  The patient presented to the emergency room on the  day of admission with left-sided chest pain awaking her from sleep.  The  patient reports several days of weakness prior to arrival, chronic dark  stools, which she has attributed to p.o. iron.  The patient also  reported a near-syncopal episode a few days prior to arrival.  She  denied any nausea, vomiting, or abdominal pain.  Upon evaluation in the  ER, the patient was found to have a hemoglobin of 7.1 with cardiac  enzymes negative x1, BNP 206, with chest x-ray revealing mild pulmonary  edema with no infiltrates.  The patient's fecal occult blood was  positive.  The patient's Coumadin was placed on hold, and the patient  was admitted for further evaluation and treatment.   PAST MEDICAL HISTORY:  1. CAD, status post CABG in 2007.  2. AAA repair.  3. History of DVT and PE, on chronic anticoagulation prior to this      admission.  4. Hypertension.  5. Hyperlipidemia.  6. Type 2 diabetes.  7. Gout  8. PAF.  9. Chronic grade 1 diastolic dysfunction.  10.History of depression.  11.CVA.  12.Asthma.   COURSE OF HOSPITALIZATION:  1. Acute blood loss anemia.  As mentioned above, the patient was      admitted to telemetry unit and transfused 2 units of packed red      blood cells, positive FOB, considered secondary to AVM versus      mucosal oozing on chronic anticoagulation per EGD in January 2009.      Coumadin was placed on hold.  Throughout hospitalization, the      patient's hemoglobin remained low despite 4 units of packed red      blood cells, at which time, gastroenterology was consulted.  The      patient underwent capsule enteroscopy on July 01, 2007.  Given the      patient's medical history and current medical condition, it is felt      best that the patient be taken off her chronic anticoagulation      permanently due to her high risk of DVTs and PEs.  Cardiology was      asked to consult.  Dr.  Excell Seltzer performed an IVC venogram on July 03, 2007 with IVC filter placement.  The patient tolerated the      procedure well without any complications.  The patient is to      continue holding her aspirin until followup in office and      resolution of GI symptoms.  The patient is instructed to      discontinue taking her Coumadin.  In addition, she is to hold her      cilostazol in setting of active bleed.  She can resume this      medication once symptoms have resolved per GI or primary care      physician.  2. Chest pain.  As mentioned above, the patient was seen and evaluated      by cardiology during this admission, chest pain thought likely      secondary to acute blood loss anemia.  The patient's cardiac      enzymes were negative x3 with no acute ischemia on EKG.  3. Acute on chronic diastolic heart failure.  The patient with mild      volume overload intermittently throughout hospitalization, likely      secondary to IV fluids and IV transfusions.  She is euvolemic at      the time of discharge, status post IV Lasix, diuretic dose has been      adjusted per  cardiology, and it may need further titration in the      near future.  4. Paroxysmal atrial fibrillation.  As mentioned in problem number #1,      the patient is not a candidate for Coumadin nor Plavix.  She is      also instructed to discontinue her aspirin until resolution of GI      symptoms.  The patient understands the risk associated with atrial      fibrillation and not being on anticoagulation, which include, but      are not limited to CVA.  The patient's rate is controlled with beta-      blockers at this time.  5. E. coli UTI.  The patient treated with 5 days of Cipro during the      hospitalization.  She will not be discharged home on any antibiotic      treatment.   MEDICATIONS AT THE TIME OF DISCHARGE:  1. Lasix 80 mg p.o. b.i.d.  2. Potassium chloride 20 mEq p.o. daily.  3. Imdur 30 mg p.o. daily.  Note, this is a new medication per      cardiology.  4. Norvasc 10 mg p.o. daily.  5. Cozaar 100 mg p.o. daily.  6. Neurontin 300 mg p.o. t.i.d.  7. Labetalol 2 mg p.o. b.i.d.  8. Zoloft 50 mg p.o. daily.  9. Zocor 40 mg p.o. q.h.s.  10.Metformin 500 mg p.o. daily.  11.The patient instructed to discontinue aspirin, Coumadin, and      cilostazol.   PERTINENT LAB WORK AT THE TIME OF DISCHARGE:  Hemoglobin 10.7,  hematocrit 32.0, white cell count 5.0, and platelet count 140.  Sodium  143, potassium 3.5, BUN 10, and creatinine 0.88.   DISPOSITION:  The patient felt medically stable for discharge home at  this time.  She is instructed to follow up her blood work per Dr. Tenny Craw  on July 08, 2007 at [facility], at which time, she will need an  H&H.  In addition, she is scheduled for a followup with Dr.  Charlies Constable  on Aug 03, 2007 at 8:45 a.m.  The patient will also be scheduled to see  her primary care physician in the following week, so that her H&H can be  closely monitored in addition to other multiple medical problems.      Cordelia Pen, NP    LE/MEDQ   D:  07/03/2007  T:  07/04/2007  Job:  [id number]   cc:   Wilhemina Bonito. Marina Goodell, MD  Everardo Beals. Juanda Chance, MD, [facility]  Verdell Face, MD

## 2010-08-14 NOTE — H&P (Signed)
NAMEANDEE, CHIVERS NO.:  [id number]   MEDICAL RECORD NO.:  [id number]          PATIENT TYPE:  INP   LOCATION:  3738                         FACILITY:  [facility]   PHYSICIAN:  Pricilla Riffle, MD, FACCDATE OF BIRTH:  [birth year]   DATE OF ADMISSION:  07/18/2008  DATE OF DISCHARGE:                              HISTORY & PHYSICAL   PRIMARY CARDIOLOGIST:  Everardo Beals. Juanda Chance, MD, [facility]   PRIMARY CARE Kendre Jacinto:  Eric Form, NP   REASON FOR EVALUATION:  Chest pain.   HISTORY OF PRESENT ILLNESS:  A 77 year old Caucasian female with  multiple medical problems, but known history of orthostatic hypotension,  CAD, prior renal artery stenosis, paroxysmal atrial fibrillation,  diastolic heart failure, and diet-controlled diabetes who was recently  discharged on July 04, 2008, after having falls with orthostatic  hypotension and UTI.  The patient was currently recovering in [facility] for rehabilitation when last night around 3:00 a.m. after being  awake, she felt substernal squeezing and burning coming up into her  throat, radiating to the back, lasting approximately 3 hours.  The  nursing staff checked her blood pressure, was 189/58, secondary to pain  lasting and not going away.  They called EMS.  With EMS, the patient's  blood pressure was over 200 systolic.  She has complaints of some mild  nausea.  She was given nitroglycerin by EMS and a second nitroglycerin  here in the emergency room.  She was also given a GI cocktail, which did  help.  Currently, she is experiencing some left breast pain, which she  describes as moving around and a head ache.  The patient says that is  not similar to her previous angina; however, she does not remember what  that felt like.  The patient is currently resting in the ER with a blood  pressure of 124/92, with only complaint at this time as headache.   REVIEW OF SYSTEMS:  Positive for headache, chest pain, GERD-like  symptoms, and low-grade  nausea.  All other systems are reviewed and are  found to be negative.   PAST MEDICAL HISTORY:  1. Diastolic congestive heart failure, CAD.      a.     Status post CABG in 2003, LIMA to LAD, and RIMA to OM.      b.     Status post cardiac catheterization in August 2009, with a       100% left main and 50% right coronary artery with patent stents.  2. Paroxysmal atrial fibrillation, not a Coumadin candidate, secondary      to GI bleed and falling.  3. DVT, status post IVC filter.  4. Peripheral arterial disease with renal artery stenosis, status post      renal artery stent on the left in 2009.  5. AAA repair.  6. Hyperlipidemia.  7. Hypertension.  8. Diet-controlled diabetes.   PAST CARDIAC EVALUATION:  1. Cardiac catheterization in 2009, revealing patent grafts.  2. Doppler studies revealing 50% or greater carotid stenosis.  3. Echocardiogram in January 2010, with an EF of 60%, with pulmonary  hypertension, and PAP greater than 50.   PAST SURGICAL HISTORY:  Coronary artery bypass grafting, LIMA to LAD and  RIMA to OM in 2009; AAA repair; IVC filter; and renal artery stent  placement in August 2009.   SOCIAL HISTORY:  She lives in [place] [facility].  She is married.  She quit  smoking in 1992, but she has a former history of 40-pack years.  Negative for EtOH.  Negative for drug use.  She uses a walker in the  nursing home for ambulation.   FAMILY HISTORY:  Mother deceased from cancer.  Father deceased from a  CVA.  She had one brother who died from CVA and 3 brothers who died from  MI.   CURRENT MEDICATIONS:  1. Lasix 40 mg daily.  2. Toprol-XL 25 mg daily.  3. Midodrine 5 mg b.i.d.  4. MiraLax 17 g b.i.d.  5. Xalatan 2.5% 1 drop each eye daily.  6. Neurontin 300 mg t.i.d.  7. Zoloft once a mg daily.  8. Cozaar 50 mg daily.  9. Vytorin 10/40 daily.  10.Plavix 75 mg daily.  11.Vitamin B12 daily.  12.Iron sulfate 325 daily.  13.Aspirin 81 mg daily.   ALLERGIES:  1.  NIASPAN.  2. PENICILLIN.  3. CODEINE.  4. MORPHINE.  5. DEMEROL.  6. DARVON.   CURRENT LABORATORIES:  Hemoglobin 10.6, hematocrit of 31.28, white blood  cells 8, and platelets 135.  Sodium 143, potassium 4.5, chloride 103,  CO2 of 32, BUN 17, creatinine 1.2, glucose 100, total bili 0.3, AP 0.1,  AST 21, ALT 14, total protein 5.8, albumin 3.4, BNP 527, lipase 26,  troponin less than 0.05.  Chest x-ray revealing cardiomegaly with  probable pulmonary venous hypertension, query COPD.  EKG revealing sinus  rhythm with PAC ventricular rate of 65 beats per minute.   PHYSICAL EXAMINATION:  Vital signs:  Blood pressure 125/92, pulse 61,  respirations 15, O2 sat 97% on room air.  GENERAL:  She is awake, alert, and oriented, complaining of headache.  HEENT:  Head is normocephalic, atraumatic.  Eyes: PERRLA.  The patient  wears glasses.  Mucous membranes mouth are pink and moist.  NECK:  Supple with bilateral bruits.  Carotid noted.  No JVD.  CARDIOVASCULAR:  Bradycardic with harsh 2/6 systolic murmur without S3  or S4.  Pulses are 2+ and equal.  She has bilateral abdominal and  femoral bruits.  LUNGS:  Clear to auscultation without wheezes, rales and rhonchi.  Abdomen is soft with some mild right upper quadrant tenderness.  No  liver organomegaly.  Extremities: No clubbing, cyanosis, some mild 2+  edema with positive bruits.  Femoral NEURO:  Cranial nerves II-XII are  grossly intact.   IMPRESSION:  1. Chest pain, questionable ischemia, questionable hypertension in the      setting of diastolic dysfunction with a systolic blood pressure      between 190 and 240 on admission.  2. Known coronary artery disease with two-vessel coronary artery      bypass grafting in 2003.  3. Diastolic congestive heart failure.  4. Orthostatic hypotension.  5. Diabetes diet controlled.  6. Pulmonary hypertension with history of pulmonary embolus at      inferior vena cava and fluid overload related causing a  pulmonary      artery pressure to be greater than 50 with a pulmonary capillary      wedge pressure on May 25, 2008.   PLAN:  This 77 year old Caucasian female with known history of  CAD,  status post CABG; hypertension, orthostatic hypotension, neuropathy,  diet-controlled diabetes, renal artery stenosis, now admitted with chest  pain to the emergency room similar to angina at that time blood pressure  was greater than 190-200 systolic.  Here with a blood pressure of 156  systolic.   The patient has no CHF on exam, positive bruits bilaterally from the  carotids to the femorals.  Her labs reveal a BNP of 527.   Question of above is secondary to Hypertension and diastolic dysfunction  questionable ischemia without acute ST-T wave changes.   We will plan to admit for 24-hour observation on telemetry, rule out MI,  and cycle troponins, and Lasix IV x1.  Decrease midodrine to 2.5 mg  b.i.d., secondary to hypertension during the nighttime.  We will  continue all other meds and make further recommendations throughout  hospital course depending on the patient's response to treatment.       Bettey Mare. Lyman Bishop, NP      Pricilla Riffle, MD, [facility]  Electronically Signed    KML/MEDQ  D:  07/18/2008  T:  07/19/2008  Job:  [id number]   cc:   Eric Form, NP

## 2010-08-14 NOTE — Op Note (Signed)
Donna Reese, Donna Reese NO.:  [id number]   MEDICAL RECORD NO.:  [id number]          PATIENT TYPE:  INP   LOCATION:  3701                         FACILITY:  [facility]   PHYSICIAN:  Iva Boop, MD,FACGDATE OF BIRTH:  1932-01-20   DATE OF PROCEDURE:  07/01/2007  DATE OF DISCHARGE:                               OPERATIVE REPORT   PROCEDURE:  Small bowel capsule endoscopy.   ORDERING PHYSICIAN:  Hedwig Morton. Juanda Chance, MD   FINDINGS:  Please see the computer generated report with photos for full  details.   This is a completed study.  She had some blood clots in the stomach from  recent biopsies at push enteroscopy the day before.  There was some  blood in the small intestine too from that.  There were no other  abnormalities known.   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  She certainly may have other AVMs that were not seen on  this study today.   The plan is for her to have an IVC filter and remain off anticoagulation  so hopefully this blood loss anemia problem from angiodysplasia will not  continue.      Iva Boop, MD,FACG  Electronically Signed     CEG/MEDQ  D:  07/03/2007  T:  07/03/2007  Job:  [id number]   cc:   Hedwig Morton. Juanda Chance, MD  Atha Starks. Bean, FNP

## 2010-08-14 NOTE — Op Note (Signed)
Donna Reese, Donna Reese NO.:  [id number]   MEDICAL RECORD NO.:  [id number]          PATIENT TYPE:  INP   LOCATION:  4706                         FACILITY:  [facility]   PHYSICIAN:  Veverly Fells. Excell Seltzer, MD  DATE OF BIRTH:  [birth year]   DATE OF PROCEDURE:  11/23/2007  DATE OF DISCHARGE:                               OPERATIVE REPORT   PROCEDURES:  1. Abdominal aortogram.  2. Bilateral selective renal angiography.  3. Left renal artery stenting.   INDICATIONS:  Donna Reese is a 77 year old woman admitted for diastolic  heart failure on November 18, 2007.  She underwent cardiac catheterization  and demonstrated patent bypass grafts and preserved LV function.  She  has had refractory hypertension in spite of multiple medications.  At  the time of her catheterization, abdominal aortogram suggested at least  moderate bilateral renal artery stenosis and probably severe stenosis of  the left renal artery.  She was referred for renal angiography and  probable stenting.   Risks and indication of procedure were reviewed with the patient.  Informed consent was obtained.  The left groin was prepped, draped, and  anesthetized using normal sterile technique.  The patient has a previous  aorta bypass.  The native artery was entered twice, and I was unable to  access the central aorta.  From the third attempt, the graft was  accessed, and I was able to obtain a wire access with the [facility] wire to  the abdominal aorta.  A 6-French sheath was placed.  An abdominal  aortogram was performed under digital subtraction with a 5-French  pigtail catheter.  This demonstrated severe left renal artery stenosis  and at least moderate right renal artery stenosis.  I elected to  performed bilateral selective renal angiography with 5-French IM  catheter.  The left renal artery had calcific severe focal ostial  stenosis.  The remaining portions of the renal artery were widely  patent.  There was early  branch to the inferior pole of the renal  artery.   On the right, the aorta had an ulcerated plaque, right near the renal  ostium.  The renal ostium appeared widely patent.  The proximal renal  artery had diffuse 50-60% stenosis.  There was no pressure gradient with  the catheter in the renal origin.  I elected not intervene on this  vessel based on those factors.   A 60 units per kg of heparin was given.  Once the therapeutic ACT was  achieved, a 6-French IM guide catheter was inserted.  A stabilizer wire  was passed easily beyond the origin of the left renal artery.  There was  an 80-100 mm gradient with the guide catheter in the renal origin.  The  vessel was treated first with a 5 x 15-mm Aviator  balloon which was  taken to 10 atmospheres.  Following the balloon dilatation, there was  some improvement, but there is still significant greater than 50%  residual ostial stenosis.  At that point, a 6 x 12 mm Genesis stent was  carefully positioned to cover the ostium and was deployed  at 10  atmospheres.  The stent was well expanded.  The balloon was brought back  and was taken to burst pressure, which is 12 atmospheres to cover the  ostium and the vessel.  At the completion of the procedure, there was no  remaining pressure gradient.  The stent was widely patent.  There was  preserved flow to all the main branches of the renal artery.  The  patient tolerated the procedure well and had no immediate complications.   FINDINGS:  Abdominal aorta, there is no aneurysm.  There is a previous  aorta bypass graft.  There is an IVC filter in place.  There is heavy  calcification of the celiac and mesenteric arteries.  There is also  heavy calcification at the left renal origin and right renal origin.   Selective left renal angiography, there is severe ostial stenosis, but  calcification of the left renal artery origin.  There is an early  inferior branch of the left renal artery that arises from  the proximal  main renal artery.  Beyond the severe ostial stenosis, the renal artery  and its branch vessels, all appear normal.   On the right side, there is an ulcerated plaque, just near the origin of  the renal artery.  There is mild-to-moderate diffuse stenosis with  calcifications around the proximal renal artery, but there does not  appear to be severe stenosis present.  There is no significant pressure  gradient present.  The renal artery has an inferior origin.   FINAL CONCLUSIONS:  1. Severe left renal artery stenosis with successful stenting is in a      6 x 12 mm Genesis stent.  2. Moderate right renal artery stenosis.   CONCLUSION:  The patient should receive 30 days of Plavix along with her  aspirin.  She is on chronic Coumadin.  We will follow her clinically by  her blood pressure response.  Also, we will followup with a renal  ultrasound.      Veverly Fells. Excell Seltzer, MD  Electronically Signed     MDC/MEDQ  D:  11/23/2007  T:  11/23/2007  Job:  [id number]   cc:   Everardo Beals. Juanda Chance, MD, [facility]

## 2010-08-14 NOTE — Assessment & Plan Note (Signed)
[facility]                            CARDIOLOGY OFFICE NOTE   KATIA, HANNEN                      MRN:          [id number]  DATE:08/12/2006                            DOB:          08-09-1931    PRIMARY CARE PHYSICIAN:  Everrett Coombe, nurse practitioner at [facility].   CLINICAL HISTORY:  Donna Reese returns for followup and management of  her congestive heart failure/volume overload and coronary heart disease.  She had bypass surgery in 2003 with subsequent documentation of patent  grafts.  She developed congestive heart failure after being put on  Pletal, and we stopped the Pletal and treated with diuretics with some  improvement.  She has had some residual swelling in her lower  extremities.   She says she has had no recent chest pain.   PAST MEDICAL HISTORY:  Significant for a history of deep vein  thrombophlebitis and pulmonary embolism and paroxysmal atrial  fibrillation, hypertension, hyperlipidemia, and diabetes.   REVIEW OF SYSTEMS:  Positive for rash on the right side of her back  associated with pain which she suspected might be related to shingles.   CURRENT MEDICATIONS:  1. Tramadol.  2. Labetalol.  3. Gabapentin.  4. Hematogen.  5. Coumadin.  6. Sertraline.  7. Cozaar.  8. Metformin.  9. Amlodipine.  10.Lipitor.  11.Lasix.  12.Cephalexin.   PHYSICAL EXAMINATION:  The blood pressure is 175/61, the pulse 71 and  regular.  There was no venous distension.  The carotid pulses were full with  bilateral bruits.  CHEST:  Clear without rales or rhonchi.  Cardiac rhythm was regular.  There was a grade 2/6 systolic ejection  murmur.  The abdomen was soft, normal bowel sounds.  I had difficulty feeling pedal pulses.  There was 1+ edema.   An electrocardiogram showed sinus rhythm with an old septal infarct.   IMPRESSION:  1. Congestive heart failure related to diastolic dysfunction      aggravated by Pletal, now  improved with still mild volume overload.  2. Coronary artery disease, status post coronary artery bypass      grafting surgery, 2003, with documented patent grafts at followup.  3. Ejection fraction of 60%.  4. Peripheral vascular disease, status post abdominal aneurysm repair      in 1997 with reduced indices in lower extremity, not felt to be a      candidate for revascularization.  5. History of paroxysmal atrial fibrillation.  6. History of deep vein thrombophlebitis and pulmonary embolism, on      Coumadin therapy.  7. Hypertension.  8. Hyperlipidemia.  9. Rash on the right back, question shingles.   RECOMMENDATIONS:  Ms. Kubicki' heart failure seems to be better, and she  would rather not go up on her diuretics despite mild edema.  Her blood  pressure is quite elevated, and I think this needs to be treated  further.  Will plan to increase her labetalol from 300 to 400 twice a  day, and we will start on Altace 10 a day.  Have her come back in a week  for  a blood pressure check and a BMP.  Will get a chest x-ray as well.  If her symptoms of shortness of breath get worse, or if her chest x-ray  suggests another problem, we might consider a CT of the chest.  I will  plan to see her back in 3 months, assuming her blood pressure is better  on her followup blood pressure check in a week.     Bruce Elvera Lennox Juanda Chance, MD, [facility]  Electronically Signed    BRB/MedQ  DD: 08/12/2006  DT: 08/12/2006  Job #: [id number]

## 2010-08-14 NOTE — Assessment & Plan Note (Signed)
[facility]                          CHRONIC HEART FAILURE NOTE   Donna Reese, Donna Reese                      MRN:          [id number]  DATE:04/07/2008                            DOB:          [birth year]    PRIMARY CARDIOLOGIST:  Everardo Beals. Juanda Chance, MD, [facility]   PRIMARY CARE PHYSICIAN:  Marlise Eves, PA at [facility], [place].   Ms. Sol is new to the Heart Failure Clinic.  She is a 77 year old  Caucasian female followed by Dr. Juanda Chance status post recent  hospitalization with discharge on March 20, 2008, for acute-on-  chronic diastolic congestive heart failure exacerbation.  Ms. Ruppe  has multiple medical problems in addition to her heart failure.  Ms.  Scripter presents today complaining of ongoing shortness of breath,  fatigue that has not improved since she was discharged home from the  hospital.  She also complained of lower extremity edema with only a 4-  pound weight reduction since being discharged home from [facility].  Ms.  Holle lives at [place] with her husband.  She is retired.  She  stopped smoking in 1992.  She denies any EtOH or drug use.   FAMILY HISTORY:  Mother deceased secondary to cancer.  Father deceased  secondary to CVA.  She has several siblings with known coronary artery  disease and 1 sibling who has had bypass.  Ms. Pienta states compliance  with her medications not so much with a ADA or sodium-restricted diet.  Activity is somewhat limited secondary to multiple medical problems.   PAST MEDICAL HISTORY:  1. Congestive heart failure secondary to diastolic dysfunction.  Most      recent echocardiogram documented is January 2008, at which time. EF      with 60-65% with mild tricuspid valvular regurgitation and peak      pulmonary artery systolic pressure moderately increased status post      recent hospitalization for the same.  2. Coronary artery disease status post bypass surgery in 2003 with      alignment to  the LAD and right radial artery to circumflex  3. History of GI bleeding secondary to AV malformation.  4. Paroxysmal atrial fib.  The patient not a Coumadin candidate,      however, secondary to history of GI bleeding secondary to AV      malformation.  5. History of DVT and pulmonary emboli status post IVC filter      placement.  6. Diabetes.  7. Obesity.  8. Hypertension.  9. Hyperlipidemia.  10.AAA repair.  11.Renal artery stenosis status post stenting of the left renal artery      by Dr. Excell Seltzer.  12.Status post cardiac catheterization done in 2009 showed patent      grafts.  13.Remote history of tobacco use.   REVIEW OF SYSTEMS:  As stated above.   ALLERGIES:  PENICILLIN, MORPHINE, DEMEROL, SULFA, and CODEINE.   CURRENT MEDICATIONS:  1. Gabapentin 300 mg t.i.d.  2. Klor-Con 20 mEq daily.  3. Zoloft 50 mg daily.  4. Amlodipine 10 mg daily.  5. Simvastatin 40 mg daily.  6.  Plavix 75.  7. Lasix 80 b.i.d.  8. Labetalol 200 t.i.d.  9. Metformin 500 daily.  10.Vitamin B12 daily.  11.Aspirin 81 daily.  12.Iron 65 mg daily.  13.Isosorbide 30 mg daily.  14.Cozaar 50 daily.   P.r.n. medications include nitroglycerin and hydrocodone.   Most recent lab work done on March 20, 2008, showed a potassium of  3.7, creatinine of 1.5.   PHYSICAL EXAMINATION:  VITAL SIGNS:  Weight 202 pounds, down 4 pounds  from previous visit, blood pressure 154/56, heart rate of 60.  GENERAL:  Ms. Moffa is in no acute distress at this time.  She has JVD  up to her jaw line.  LUNGS:  Clear to auscultation with distant breath sounds bilateral  bases.  CARDIOVASCULAR:  S1 and S2, regular rate and rhythm with a 2/6 systolic  ejection murmur noted.  ABDOMEN:  Obese, soft, nontender, positive bowel sounds.  LOWER EXTREMITIES:  With +2 pitting edema bilaterally.  NEUROLOGIC:  Alert and oriented x3.   IMPRESSION:  Congestive heart failure secondary to diastolic  dysfunction.  The patient still  with significant volume overload and  ongoing dyspnea.  I have initiated heart failure education with Ms.  Su Hilt.  She is still very symptomatic with class III symptoms.  I am  going to go ahead and schedule her for repeat 2-D echocardiogram since  this has been 2 years since she had one.  As far as her diuretics, I am  going to increase her Lasix to 120 in the morning and 80 in the  afternoon and gave her 2.5 of Zaroxolyn for the next 2 days.  We will  need to repeat blood work after that to make sure kidneys are not  stressed since she lives at [place], we will arrange for her to  have her labs drawn at [facility] office and we will review them  afterwards and I will plan on seeing her back within the next couple of  weeks for reevaluation.  In regards to her medications, we will continue her current blood  pressure medicines, pending results of echocardiogram and the patient's  pressures.      [name], ACNP  Electronically Signed      Rollene Rotunda, MD, [facility]  Electronically Signed   MB/MedQ  DD: 04/20/2008  DT: 04/21/2008  Job #: [id number]

## 2010-08-14 NOTE — Discharge Summary (Signed)
Donna Reese, Donna Reese               ACCOUNT NO.:  [id number]   MEDICAL RECORD NO.:  [id number]          PATIENT TYPE:  INP   LOCATION:  3715                         FACILITY:  [facility]   PHYSICIAN:  Valerie A. Felicity Coyer, MDDATE OF BIRTH:  [birth year]   DATE OF ADMISSION:  06/27/2008  DATE OF DISCHARGE:  07/04/2008                               DISCHARGE SUMMARY   DISCHARGE DIAGNOSES:  1. Severe orthostatic hypotension with syncope and falls prior to      admission.  Medication adjustment, now for skilled nursing      rehabilitation.  2. Uncontrolled hypertension at rest, complicated by orthostatic      changes.  As above.  3. Klebsiella urinary tract infection, status post treatment with      Cipro, resolved.  4. Subacute headache, nonfocal, nonspecific.  Outpatient neurology      follow-up as prior to admission.  5. Chronic diastolic heart failure, euvolemic, with decreased dose of      Lasix.  Continued careful management of fluids with close      cardiology follow-up, Dr. Juanda Chance, as below.  6. Type 2 diabetes, diet controlled.  Continue outpatient follow-up.  7. Paroxysmal atrial fibrillation, not anticoagulation candidate due      to falls and high risk.  8. Dyslipidemia.  9. Anemia of chronic disease.  Outpatient follow-up with primary      medical doctor.  10.Chronic kidney disease stage 3.  11.History of pulmonary embolism, deep vein thrombosis status post      inferior vena cava filter April 2009.  12.History of gastrointestinal bleed March 2009, no evidence of active      disease.  13.Severe peripheral neuropathy per EMG March 2009.  14.Peripheral vascular disease status post right renal artery stenosis      stent in August 2009.   DISCHARGE MEDICATIONS:  1. Lasix 40 mg once daily (previous 80 mg 2 times daily.  2. Toprol XL 25 mg once daily (patient no longer on labetalol).  3. Midodrine 5 mg p.o. 3 times daily WC.  4. MiraLax 17 grams p.o. 2 times daily p.r.n.  constipation.   OTHER MEDICATIONS:  Are as prior to admission, include:  1. Xalatan 2.5% drop 1 drop to teach eye daily.  2. Neurontin 300 mg p.o. 3 times daily.  3. Zoloft 100 mg daily.  4. Cozaar 50 mg daily.  5. Vytorin 10/40 p.o. daily.  6. Plavix 75 mg daily.  7. Vitamin B12 500 mg p.o. daily.  8. Iron sulfate 325 mg once daily.  9. Aspirin 81 mg once daily.   DISPOSITION:  The patient is discharged to [facility] for short-term  skilled nursing facility to assist with her gait training in the setting  of symptomatic orthostatic hypotension.  Hospital follow-up will be with  primary care physician, nurse practitioner Everrett Coombe, at [facility],  as well as her cardiologist Dr. Charlies Constable, timing to be arranged by  Dr. Juanda Chance following discharge.   HOSPITAL COURSE BY PROBLEM:  1. Falls with severe orthostatic hypotension.  The patient is a 77-      year-old woman  with multiple chronic medical issues including      vascular disease heart disease and peripheral neuropathy as      described above, came to the emergency room due to recurrent falls.      Initial evaluation showed no acute neurologic or laboratory data      abnormality though her ER workup did show evidence for a UTI, see      details below.  She was seen by PT and OT and daily orthostatics      were checked, showing profound orthostatic hypotension which was      most often symptomatic and likely contributing to her falls.  There      were no other metabolic or electrolyte abnormalities or endocrine      abnormalities that could be blamed for her neuropathy and after      careful consultation and consideration with cardiology, given her      severe resting uncontrolled hypertension, it was determined that      reduction in her beta-blocker, reduction in her diuretic therapy      with supplemental co-existing treatment of midodrine may help      mitigate her orthostatic symptoms.  She was also placed on  thigh-      high TED hose which, though she is uncomfortable in due to the      tightness, is felt to provide some benefit.  With these medical      changes, with the assistance of cardiology, and consultation for PT      and OT, I feel the patient is now ready for a short-term skilled      nursing facility.  There being no other acute medical issues      requiring further inpatient evaluation, she is felt stable for      discharge at this time with outpatient follow-up at cardiology to      be arranged by Dr. Juanda Chance, at the time of this dictation, as well      as her primary care physician upon return home from the skilled      nursing facility.  2. Klebsiella urinary tract infection.  The patient did have a      positive urinalysis, though she was afebrile, with a normal white      count.  She was treated with Cipro to which this was sensitive and      completed a 7-day course of antibiotic therapy.  There were no      other acute issues in this regard.  3. Other medical issues.  The patient's other chronic medical issues      are as listed in discharge summary above.  Her medications have      been changed, as noted, but otherwise remain as prior to admission      for the treatment of her co-existing other chronic medical      illnesses.   PHYSICAL EXAM AT TIME OF DISCHARGE:  Temperature of 98.2, blood pressure  resting 171/52, pulse of 54, respirations 18, saturating 99% on room  air.  She has a positive systolic drop from 189 down to 111 going from  the supine to the standing position this morning of dictation, but in  general she is in no acute distress.  Appears younger than stated age.  HEENT:  She wears corrective lenses.  PERL, EOMI.  No scleral icterus.  LUNG SOUNDS:  Clear to auscultation bilaterally without wheeze or  crackle.  There is no increased  work of breathing or use of accessory  muscles.  CARDIOVASCULAR:  Regular rate and rhythm with trace lower extremity  edema.   She is wearing bilateral thigh-high TED hose at the time of this  dictation.  ABDOMEN:  Protuberant but soft, nontender, nondistended.  Good bowel  sounds.  No appreciable masses or hepatosplenomegaly.  NEUROLOGICALLY:  She is awake, alert and oriented x4.  Cranial nerves II-  XII are symmetrically intact.  She follows commands.  She moves all  extremities and has no gross motor cognitive or sensory deficits.   LABORATORY DATA PRIOR TO DISCHARGE:  Creatinine of 1.38, potassium of  5.4.  sodium, chloride, bicarbonate, glucose within normal limits.  CBC  on April 2nd shows white count normal at 4.0, hemoglobin of 10.8,  platelet count of 157.  Urine culture from March 29th shows Klebsiella  pneumonia greater than 100,000 colonies, sensitive to Cipro.  Anemia  panel March 31st shows an iron of 35, percent saturation 14 but normal  TIBC at 250, normal B12 level at 769, normal folate level at 3.6 and  ferritin of 61.  A1c done March 29th was 5.7, TSH is 3.1, negative  cardiac enzymes, normal LFTs on March 29th.  The patient is no longer  going to be taken Klor-Con at the time of discharge due to elevated  potassium levels in the setting of reduced Lasix use.  Potassium level  at time of discharge 5.4, now to remain off supplemental potassium.   Greater than 30 minutes spent on day of discharge coordination for  discharge planning, instructions review and follow-up.      Valerie A. Felicity Coyer, MD  Electronically Signed     VAL/MEDQ  D:  07/04/2008  T:  07/04/2008  Job:  [id number]

## 2010-08-14 NOTE — Assessment & Plan Note (Signed)
[facility]                            CARDIOLOGY OFFICE NOTE   Donna, Reese                      MRN:          [id number]  DATE:06/16/2007                            DOB:          01-Sep-1931    PRIMARY CARE PHYSICIAN:  Donna Starks. Bean, FNP at [facility].   CLINICAL HISTORY:  Donna Reese returns for follow up management of  coronary heart disease and congestive heart failure.  She was  hospitalized in January with congestive heart failure which was  precipitated by profound anemia related to GI blood loss.  She had a GI  workup that was essentially negative and she was thought to be bleeding  from AV malformations.  We kept her off her Coumadin for awhile, but  this was resumed at last visit with a goal to target INR from 1.7-2.7.   She has done fairly well from the standpoint of her heart.  She has had  no chest pain.  She has had some mild swelling, but this has not changed  too much.  She recently fell and bruised her ribs and this is one of her  major complaints today.   PAST MEDICAL HISTORY:  1. Significant for previous coronary bypass surgery.  2. She has had abdominal aortic aneurysm repair.  3. She has had a history of deep vein thrombophlebitis and pulmonary      embolism for which she is on Coumadin therapy.  4. Hypertension.  5. Hyperlipidemia.  6. Diabetes.  7. Gout.   CURRENT MEDICATIONS:  1. Cilostazol.  2. Sertraline.  3. Aspirin.  4. Xalatan.  5. Simvastatin.  6. Hematogen.  7. Coumadin.  8. Cozaar 100 mg one-half tablet daily.  9. Lasix 80 mg b.i.d.  10.Amlodipine 10 mg one-half tablet daily.  11.Metformin 500 mg.  12.Labetalol 200 mg daily.  13.KCl 20 mEq daily.   PHYSICAL EXAMINATION:  VITAL SIGNS:  Blood pressure is 162/46, pulse 74  and regular.  NECK:  There was no venous distension.  The carotid pulses were full.  There were bilateral bruits.  CHEST:  Clear.  HEART:  Rhythm is regular.  Systolic  murmur at the left sternal edge.  ABDOMEN:  Protuberant.  Abdomen was soft without hepatosplenomegaly.  EXTREMITIES:  There is trace to 1+ peripheral edema.   IMPRESSION:  1. Recent hospitalization for diastolic congestive heart failure      primarily related to anemia.  2. Recent acute anemia related to gastrointestinal blood loss with a      prolonged prothrombin time thought to be related to      atrioventricular malformations with negative endoscopy.  3. Coronary artery disease status post coronary bypass graft surgery.  4. Status post abdominal aortic aneurysm repair.  5. History of deep vein thrombophlebitis and pulmonary embolism with      Coumadin therapy.  6. Hypertension.  7. Hyperlipidemia.  8. Diabetes.  9. History of gout.   RECOMMENDATIONS:  From a cardiac standpoint, I think [name] is  doing reasonably well.  We will continue her Lasix 80 mg b.i.d.  With  her  blood pressure being elevated, we will increase the Cozaar from half  of 100 mg daily to a full 100 mg daily.  We will get a CBC, BMP and BNP  next week.  We will give instructions to [facility] with some  monitoring her INRs to target an INR of 1.7-2.7 rather than 2-3.  I will  plan to see her back in 3 months.     Bruce Elvera Lennox Juanda Chance, MD, [facility]  Electronically Signed    BRB/MedQ  DD: 06/16/2007  DT: 06/17/2007  Job #: [id number]   cc:   Donna D. Bean, FNP

## 2010-08-14 NOTE — Op Note (Signed)
NAMELAPORSHIA, HOGEN NO.:  [id number]   MEDICAL RECORD NO.:  [id number]          PATIENT TYPE:  INP   LOCATION:  3701                         FACILITY:  [facility]   PHYSICIAN:  Veverly Fells. Excell Seltzer, MD  DATE OF BIRTH:  1931-12-09   DATE OF PROCEDURE:  07/03/2007  DATE OF DISCHARGE:                               OPERATIVE REPORT   PROCEDURES:  1. Inferior vena cava venogram.  2. Inferior vena cava filter placement.   INDICATIONS:  Ms. Cipriani is a 77 year old woman well-known to our  service with recurrent cardiovascular problems.  She presented on March  27 with congestive heart failure and angina related to severe anemia.  She has had recurrent GI bleeding secondary to AVMs.  She has chronic  indication for Coumadin because of DVT and PE.  Her bleeding risk has  been deemed too high her Coumadin and she was therefore referred for IVC  filter placement with her ongoing thromboembolic risk.   Risks and indications of the procedure were reviewed with the patient  and informed consent was obtained.  The right groin was prepped, draped  and anesthetized with 1% lidocaine.  Using modified Seldinger technique,  the right femoral vein was accessed without difficulty.  A 6-French  introducer sheath was advanced to the distal inferior vena cava, where  an IVC venogram was performed with digital subtraction imaging.  The  renal veins were well-visualized and were widely patent.  Using a road  map technique, the introducer sheath was advanced and the IVC filter was  placed just below the right renal vein.  The patient tolerated the  procedure well and there were no immediate complications.   FINDINGS:  The inferior vena cava is widely patent.  There is no  thrombus noted.  Both the right and left renal veins were well-  visualized.  The size of the IVC is suitable for filter placement.   ASSESSMENT:  Successful placement of a TrapEase permanent inferior vena  cava  filter.      Veverly Fells. Excell Seltzer, MD  Electronically Signed     MDC/MEDQ  D:  07/03/2007  T:  07/03/2007  Job:  [id number]

## 2010-08-14 NOTE — Assessment & Plan Note (Signed)
[facility]                          CHRONIC HEART FAILURE NOTE   Donna Reese, Donna Reese                      MRN:          [id number]  DATE:04/21/2008                            DOB:          07-07-1931    PRIMARY CARDIOLOGIST:  Everardo Beals. Juanda Chance, MD, [facility]   PRIMARY CARE PHYSICIAN:  Atha Starks. Bean, FNP, at [facility].   Donna Reese returns today for further followup of her congestive heart  failure which is secondary to diastolic dysfunction.  I saw her as a new  patient a couple of weeks ago for her initial visit here in the Heart  Failure Clinic.  At that time, I increased her Lasix to 120 in the  morning and 80 in the evening, put her on Zaroxolyn 2.5 mg for 2 days in  addition to her Lasix.  She states she took the [place], but did not  realize she was supposed to increase the Lasix thus she did not make  this change.  She states she is still short of breath and has swelling  in her legs.  Also, obtained blood work at that time.  Potassium was 5.1  and creatinine 1.6.  BNP was 379.  She denies orthopnea or PND.  Complains shortness of breath with exertion.  Her weight is down 3  pounds today.  A 2-D echocardiogram obtained on April 14, 2008, shows  a normal EF, mild LVH, moderate diastolic dysfunction, moderate biatrial  enlargement.   PAST MEDICAL HISTORY:  1. Congestive heart failure secondary to diastolic dysfunction with a      normal EF.  Most recent echo results as stated above.  2. Coronary artery disease, status post bypass.  3. History of GI bleeding secondary to AV malformation.  4. Paroxysmal atrial fib, not a Coumadin candidate.  5. History of DVT and pulmonary emboli, status post IVC filter.  6. Diabetes.  7. Obesity.  8. Renal artery stenosis, status post stent to the left renal artery      by Dr. Excell Seltzer.   Donna Reese states she is pending a renal ultrasound in February.   REVIEW OF SYSTEMS:  As stated above.   CURRENT MEDICATIONS:  1. Gabapentin 300 t.i.d.  2. Klor-Con 20.  3. Zoloft 50.  4. Simvastatin 40.  5. Amlodipine 10.  6. Plavix 75.  7. Lasix should be 120 and 80 b.i.d.  The patient is taking 80 b.i.d.  8. Labetalol 200 t.i.d.  9. Metformin 500 daily.  10.Vitamin B12 daily.  11.Aspirin 81.  12.Iron 65.  13.Isosorbide 30.  14.Cozaar 50.   PHYSICAL EXAMINATION:  VITAL SIGNS:  Weight 199, blood pressure 151/57,  and heart rate 64.  GENERAL:  Donna Reese is in no acute distress.  NECK:  She has no signs of jugular vein distention at 45 degrees angle.  LUNGS:  Clear to auscultation.  CARDIOVASCULAR:  Regular.  She has a 2/6 systolic ejection murmur noted.  ABDOMEN:  Soft, nontender, positive bowel sounds.  EXTREMITIES:  Lower extremities without clubbing or cyanosis.  She has 1  to 2+ nonpitting edema bilaterally.  NEUROLOGIC:  Alert and oriented x3.   IMPRESSION:  Congestive heart failure secondary to diastolic  dysfunction.  We will increase the patient's Lasix to 120 in the morning  and 80 in the evening.  I am going to stop her Norvasc and increase her  Cozaar to 100.  I would like her to wear compression stockings.  I have  written a prescription for prescription strength support stockings and  we will plan on checking labs again today and see her back in followup  in the next 3 weeks for reevaluation.      [name], ACNP  Electronically Signed      Rollene Rotunda, MD, [facility]  Electronically Signed   MB/MedQ  DD: 04/21/2008  DT: 04/22/2008  Job #: [id number]

## 2010-08-14 NOTE — Assessment & Plan Note (Signed)
[facility]                            CARDIOLOGY OFFICE NOTE   CHLOE, FLIS                      MRN:          [id number]  DATE:03/10/2007                            DOB:          1932/03/15    PRIMARY CARE PHYSICIAN:  Dr. Everrett Coombe at [facility]   CLINICAL HISTORY:  Donna Reese is 77 years old and returned for  followup management of her coronary heart disease and vascular disease.  She has had previous bypass surgery in 2002.  She also has a history of  diastolic heart failure and hypertension.  She also has had paroxysmal  atrial fibrillation and a history of pulmonary embolism, deep vein  thrombophlebitis.   When I saw her last in August she was having problems with postural  hypotension and her Cozaar had been decreased and we decreased her  labetalol.   She comes in today with complaints of palpitations, which is a feeling  of her heart fluttering and missing.  This has been going on for several  days.  These symptoms last about a minute and then may recur later.  She  has had no associated chest pain, shortness of breath.  She does have  chronic edema.   PAST MEDICAL HISTORY:  Significant for:  1. Deep vein thrombophlebitis and pulmonary embolism for which she      takes Coumadin.  2. History of hypertension, hyperlipidemia, diabetes and diastolic      heart failure.   CURRENT MEDICATIONS:  Labetalol, tramadol, Coumadin, cilostazol,  sertraline, Cozaar, furosemide, aspirin, metformin, amlodipine and  simvastatin.   EXAMINATION:  Blood pressure is 165/60 and pulse 75 and regular.  There  was no vein distention.  The carotid pulses were full.  There were  bilateral bruits.  CHEST:  Clear.  HEART:  Rate and rhythm is regular.  There is a 2/6 systolic murmur.  ABDOMEN:  Soft with normal bowel sounds.  There was bilateral bruit.  There was 2+ peripheral edema and the pedal pulses were difficult to  feel.   IMPRESSION:  1. Palpitations of uncertain etiology.  2. Coronary artery disease status post coronary bypass graft surgery      in 2002.  3. Paroxysmal atrial fibrillation on Coumadin therapy.  4. Hypertension with a previous history of postural hypotension.  5. History of deep vein thrombophlebitis and pulmonary embolism.  6. Hyperlipidemia.  7. Diabetes.  8. Status post abdominal aortic aneurysm repair.  9. Bilateral carotid bruits.   RECOMMENDATIONS:  [name] main immediate problem is recent onset  palpitations.  Will plan to get a BNP, magnesium level and TSH.  I will  also get an Adenosine rest stress Myoview scan to rule out ischemic  etiology for her symptoms.  Will increase her labetalol from 200 twice a  day to three times a day.  If her studies are negative and her symptoms  persist then will consider an event monitor.  I will give her a followup  in 6 months but will arrange to see her sooner depending on the results  of her tests and whether  she has continued symptoms.     Bruce Elvera Lennox Juanda Chance, MD, [facility]  Electronically Signed    BRB/MedQ  DD: 03/10/2007  DT: 03/10/2007  Job #: [id number]

## 2010-08-14 NOTE — Assessment & Plan Note (Signed)
[facility]ALTH[facility]                            CARDIOLOGY OFFICE NOTE   ARANTZA, DARRINGTON                      MRN:          [id number]  DATE:10/02/2006                            DOB:          1931-10-29    REASON FOR CONSULTATION:  Preoperative evaluation prior to resection of  buccal mass.   CLINICAL HISTORY:  Ms. Leary is 77 years old, and has coronary heart  disease, and had coronary artery bypass surgery in 2003 with documented  patent grafts in October 2007.  She also has had congestive heart  failure related to diastolic dysfunction aggravated by Pletal, which has  been controlled recently with diuretics.   She said she has been doing fairly well from a cardiac standpoint  recently, and has had rare chest pain, and no swelling, except for  chronic swelling in her left lower extremity.   She has lesion inside her mouth, and is scheduled for surgery on October 15, 2005 by Dr. Gertie Baron.  This will be done under heavy sedation,  and it has been requested that her Coumadin be stopped prior to the  surgery.   PAST MEDICAL HISTORY:  Significant for:  1. Paroxysmal atrial fibrillation.  2. History of deep vein thrombophlebitis and pulmonary embolism.  3. Hypertension.  4. Hyperlipidemia.  5. Peripheral vascular disease.  6. She has had a previous abdominal aortic aneurysm repair in 1997,      and has severe peripheral vascular disease, and claudication, but      is not felt to be a surgical candidate.   SOCIAL HISTORY:  She lives with her husband.  She does not drink or  smoke.  She is a retired Education officer, environmental.  She was a previous smoker until  52.  She lives in [place].   FAMILY HISTORY:  Her mother died of cancer at 58, and her father died of  a stroke at 38.  She has a brother who had bypass surgery, and another  brother who had an MI.   REVIEW OF SYSTEMS:  Positive for bilateral claudication.   CURRENT MEDICATIONS:  Include:  1.  Hematogen.  2. Labetalol 200 mg 2 tablets b.i.d.  3. Tramadol p.r.n.  4. Metformin.  5. Amlodipine 10 mg b.i.d.  6. Coumadin as directed.  7. Cilostazol 100 mg b.i.d.  8. Sertraline.  9. Altace 10 mg daily.  10.Cozaar 100 mg daily.  11.Furosemide 40 mg 1/2 tablet daily.  12.Lipitor 20 mg nightly.  13.Aspirin 81 mg daily.  14.ye drops.   EXAMINATION:  Blood pressure is 129/40.  Pulse is 70 and regular.  There was no venous distention.  The carotid pulses were full.  There  was a moderate bruit on the left side.  CHEST:  Clear without rales or rhonchi.  The cardiac rhythm was regular.  There was a 2/6 systolic ejection  murmur left sternal edge.  I could hear no diastolic murmur.  ABDOMEN:  Soft.  There was a midline scar from previous vascular  surgery.  Bowel sounds were normal.  There is no hepatosplenomegaly.  There was 1+  edema in her left lower extremity.  There was trace edema  of the right lower extremity.  I had difficulty feeling pedal pulses.  MUSCULOSKELETAL:  Exam showed no deformities.  SKIN:  Warm and dry.  NEUROLOGIC:  Examination showed no focal neurologic signs.   IMPRESSION:  1. Coronary artery disease status post coronary artery bypass graft      surgery 2003 with patent grafts at followup.  2. Ejection fraction 60%.  3. Congestive heart failure related to diastolic dysfunction, now      euvolemic and compensated.  4. Peripheral vascular disease status post abdominal aortic aneurysm      repair in 1997 with severe peripheral vascular disease and lower      extremity claudication.  Not felt to be a surgical candidate.  5. History of paroxysmal atrial fibrillation.  6. History of deep vein thrombophlebitis and pulmonary embolism on      Coumadin therapy.  7. Hypertension.  8. Hyperlipidemia.  9. Diabetes.   RECOMMENDATIONS:  1. I think Ms. Dimaano' cardiac condition is stable, and I think she      is at low to moderate risk for her upcoming surgery.  I  think she      can come off the Coumadin 1 week prior to her surgery.  I do not      think that she will need heparin overlap.  I would recommend      continuing her aspirin through surgery.  She will not need SBE      prophylaxis.  We will get a BNP and CBC on her today to make sure      potassium is okay with the multiple medications that she is on, and      make sure her renal function is okay.  2. I will plan to see her back in 4 months.     Bruce Elvera Lennox Juanda Chance, MD, [facility]  Electronically Signed    BRB/MedQ  DD: 10/02/2006  DT: 10/02/2006  Job #: [id number]   cc:   Anesthesia Department [facility]

## 2010-08-14 NOTE — Assessment & Plan Note (Signed)
[facility]                            CARDIOLOGY OFFICE NOTE   Donna, Reese                      MRN:          [id number]  DATE:11/19/2006                            DOB:          07-02-1931    PRIMARY CARE PHYSICIAN:  Everrett Coombe at [facility].   CLINICAL HISTORY:  Donna Reese is 77 years old, and comes back early  at the request of Billie Bean because of problems with dizziness and  postural hypotension.  About a week ago, she had an episode of nausea  and diarrhea, and she got quite dizzy when she stood up.  Billie Bean  documented a drop in her blood pressure, and cut back her Cozaar from  100 mg a day to 1/2 tablet a day.  Sometime since her last visit here on  July 3rd, her Altace was also stopped, but she cannot remember the  reason for this.  Since that time, she has done fairly well, and the  diarrhea and vomiting have cleared, but she still has had some dizziness  related to postural changes.  She comes in today for evaluation of this.  She has shortness of breath with exertion, but this has not changed.  She said she has had no chest pain or palpitations.   Her cardiac history is positive for previous bypass surgery in 2003 with  patent grafts at followup.  She also has a history of congestive heart  failure with diastolic dysfunction.  She has had a history of paroxysmal  atrial fibrillation.   PAST MEDICAL HISTORY:  Significant for deep vein thrombophlebitis and  pulmonary embolism.  She is currently on Coumadin therapy for his.  She  also has history of hypertension, hyperlipidemia, diabetes, and  diastolic heart failure.   CURRENT MEDICATIONS:  Include Hematogen, labetalol, tramadol, Coumadin,  cilostazol, sertraline, Cozaar, furosemide, Lipitor, aspirin, metformin,  and amlodipine.   EXAMINATION:  The blood pressure was 136/56 sitting, and 108/50 standing  with slight dizziness.  There was no venous distention.  The  carotid pulses were full with  bilateral bruits.  CHEST:  Was clear without rales or rhonchi.  CARDIAC:  Rhythm was regular.  There was short systolic murmur at the  left sternal edge.  I could hear no diastolic murmur.  ABDOMEN:  Soft with normal bowel sounds.  There was no  hepatosplenomegaly.  There was mostly nonpitting edema of the lower extremities with trace to  1+ pitting edema.  Pedal pulses were equal.   IMPRESSION:  1. Postural hypotension and dizziness.  Probably related to      medications, and possible persistent mild volume depletion.  2. History of hypertension.  3. Coronary heart disease, status post prior coronary artery bypass      graft surgery.  4. Paroxysmal atrial fibrillation.  5. Status post abdominal aortic aneurysm repair.  6. History of deep vein thrombophlebitis and pulmonary embolism,      currently on Coumadin therapy.  7. Hyperlipidemia.  8. Diabetes.   RECOMMENDATIONS:  I think [name] is some better from last week.  She still  does have some postural drop in symptoms from this.  We will  plan to cut back her labetalol further from 200 mg 2 tablets b.i.d. to 1  tablet b.i.d.  We will also get a BNP to make sure that she is not over  diuresed, although if anything, I think her volume status is slightly  up.  I will plan to see her back in 4 months, and she will plan to  follow up with Billie Bean.  I will see her sooner if she has recurrent  problems related to her blood pressure or cardiac condition.     Bruce Elvera Lennox Juanda Chance, MD, [facility]  Electronically Signed    BRB/MedQ  DD: 11/19/2006  DT: 11/20/2006  Job #: [id number]

## 2010-08-14 NOTE — Discharge Summary (Signed)
NAMEMCCARTNEY, BRUCKS               ACCOUNT NO.:  [id number]   MEDICAL RECORD NO.:  [id number]          PATIENT TYPE:  INP   LOCATION:  3738                         FACILITY:  [facility]   PHYSICIAN:  Everardo Beals. Juanda Chance, MD, FACCDATE OF BIRTH:  02-15-1932   DATE OF ADMISSION:  04/09/2007  DATE OF DISCHARGE:  04/15/2007                               DISCHARGE SUMMARY   PRIMARY CARDIOLOGIST:  Everardo Beals. Juanda Chance, MD, [facility].   PRIMARY CARE Shalinda Burkholder:  Atha Starks. Bean, FNP.   DISCHARGE DIAGNOSIS:  Acute-on-chronic diastolic congestive heart  failure.   SECONDARY DIAGNOSES:  1. Iron deficiency anemia.  2. Presumed gastrointestinal bleed, possibly secondary to mucosal      oozing with chronic Coumadin usage versus occult small      arteriovenous malformations.  3. History of deep venous thrombosis and pulmonary embolism, on      chronic Coumadin anticoagulation.  4. Hypertension.  5. Hyperlipidemia.  6. Type 2 diabetes mellitus.  7. History of abdominal aortic aneurysm, status post repair.  8. History of coronary artery disease, status post coronary artery      bypass graft in 2002.  9. Acute gout.   ALLERGIES:  CODEINE, PENICILLIN, DARVON, SULFA, MORPHINE SULFATE.   PROCEDURE:  EGD.   HISTORY OF PRESENT ILLNESS:  A 77 year old Caucasian female who  presented to the clinic on April 09, 2007, for an unscheduled visit  secondary to increasing symptoms of shortness of breath and chest pain  over a 2-day period.  She was seen by Dr. Charlies Constable, and the decision  was made to admit her for management of acute-on-chronic diastolic  congestive heart failure.   HOSPITAL COURSE:  The patient was initiated on IV diuresis with brisk  diuresis and weight loss.  On admission, she was noted to be anemic with  a hemoglobin of 7.5 and a hematocrit of 23.3.  She also had heme-  positive stool.  She was seen by gastroenterology, and the decision was  made to pursue EGD, which was performed on January 12  showing atrophic  gastric mucosa and a distal esophageal stricture but no evidence of  bleeding.  It was GI's feeling that her iron deficiency anemia was  secondary to nonspecific mucosal oozing as can be seen with chronic  warfarin or aspirin therapy but also could be occult small bowel AVMs.  The patient had a colonoscopy one year ago and it was not felt that she  required repeat colonoscopy at this time.  Ms. Creasman was transfused  with 2 units of packed red blood cells and her H&H have been stable  since January 10.   From a heart failure standpoint, following diuresis Ms. Farnam has had  significant clinical improvement in dyspnea and lower extremity edema.  She has unfortunately developed fairly significant pain and tenderness  of the left great toe and instep of her foot.  We have sent off a uric  acid level, and this has come back elevated at 9.  We have treated her  acutely with colchicine and suspect that she has had an acute gout flare  in  the setting of IV diuresis.  We will defer decision regarding long-  term allopurinol therapy to Dr. Jillyn Hidden as her symptoms may abate now that  she is off of IV diuretics.   Secondary to presumed GI bleed and anemia, Ms. Linnemann' Coumadin has  been held and at this point we have not reinitiated this.  We will plan  to readdress this as an outpatient and initiate it at that point.  Ms.  Janowicz is otherwise being discharged home today in good condition.   DISCHARGE LABORATORIES:  Hemoglobin 8.8, hematocrit 27.6, WBC 6,  platelets 202.  Sodium 141, potassium 3.4, chloride 97, CO2 33, BUN 26,  creatinine 1.06, glucose 117.  Calcium 8.9.  Uric acid 9.  Total  bilirubin 0.6, alkaline phosphatase 76, AST 13, ALT 12, total protein  5.9, albumin 3.4.  CK 62, MB 3.9, troponin I 0.21.  BNP was 548 on  admission and rose to 732.  TSH 2.59. Vitamin B12 was 141.   DISPOSITION:  The patient is being discharged home today in good  condition.    FOLLOWUP PLANS AND APPOINTMENTS:  We have arranged followup with Dr.  Juanda Chance in approximately 2 weeks.  We will obtain a CBC at that time.   DISCHARGE MEDICATIONS:  1. Aspirin 81 mg daily.  2. Labetalol 300 mg b.i.d.  3. Hematogen soft gel cap b.i.d.  4. Tramadol 1 to 2 tabs q.8h.  5. Metformin 250 mg b.i.d.  6. Norvasc 10 mg daily.  7. Pletal 100 mg b.i.d.  8. Zoloft 50 mg daily.  9. Cozaar 100 mg daily.  10.Lasix 80 mg b.i.d.  11.Lipitor 20 mg q.h.s.  12.Xalatan 1 drop both eyes daily.  13.Colchicine 0.6 mg daily as needed for foot pain.  14.Nitroglycerin sublingual p.r.n. chest pain.   OUTSTANDING LABORATORY STUDIES:  None.   DURATION OF DISCHARGE ENCOUNTER:  Seventy minutes, including physician  time.      Nicolasa Ducking, ANP      Bruce R. Juanda Chance, MD, [facility]  Electronically Signed    CB/MEDQ  D:  04/15/2007  T:  04/15/2007  Job:  [id number]   cc:   [name] D. Bean, FNP

## 2010-08-14 NOTE — H&P (Signed)
Donna Reese, Donna Reese NO.:  [id number]   MEDICAL RECORD NO.:  [id number]          PATIENT TYPE:  INP   LOCATION:  4706                         FACILITY:  [facility]   PHYSICIAN:  Duke Salvia, MD, FACCDATE OF BIRTH:  [birth year]   DATE OF ADMISSION:  11/18/2007  DATE OF DISCHARGE:                              HISTORY & PHYSICAL   PRIMARY CARDIOLOGIST:  Everardo Beals. Juanda Chance, MD, [facility].   PRIMARY CARE PHYSICIAN:  Billie D. Bean, FNP   HISTORY OF PRESENT ILLNESS:  This is a 77 year old Caucasian female,  well known to Dr. Juanda Chance with history of coronary artery disease,  coronary artery bypass grafting in 2007 with LIMA to LAD in left radial  artery to circumflex, also history of diastolic heart failure,  paroxysmal atrial fibrillation, not a Coumadin candidate, diabetes, and  hypertension who has been complaining of increased dyspnea x1 week,  weakness x1 month, and experienced chest pain and heaviness radiated to  her back in axillary area of the left arm today.  The patient states  that she has not been feeling herself over the last month and has  noticed increased shortness of breath and then since this past week, she  has noticed increased dyspnea and lower extremity edema.  She does admit  that her Lasix is not working as well as it has been.  The patient had  episode of substernal chest pain, which she described as someone  pressing on her chest with radiation to the back and left axillary area.  As a result of this, she took one nitroglycerine, called Dr. Smitty Cords  Brodie's office, and was advised to call EMS, and be brought to  emergency room.  The patient states that the discomfort of symptoms were  similar to the discomfort she had prior to the last CHF episode when she  was admitted in January 2009.  She also noticed early satiety and more  tired x1 month.  The patient also states that she has just been feeling  listless.   REVIEW OF SYSTEMS:  Shortness of breath,  dyspnea on exertion, lower  extremity edema.  She has also noticed some frequency and dysuria, and  she has chronic 2-pillow orthopnea.   PAST MEDICAL HISTORY:  1. Coronary artery disease      a.     Coronary artery bypass grafting in 2003 with LIMA to LAD and       right radial artery to circumflex.  2. Diastolic congestive heart failure.  3. GI bleed secondary to AV malformation, not a Coumadin candidate.  4. Paroxysmal atrial fibrillation.  5. DVT and PE, status post IVC filter placement.  6. Diabetes.  7. Hypertension.  8. Hyperlipidemia.   PAST SURGICAL HISTORY:  Coronary artery bypass grafting and AAA repair.   SOCIAL HISTORY:  She lives in [place] with her husband.  She is  retired.  She stopped smoking in 1992.  She does not drink alcohol or  use drugs.   FAMILY HISTORY:  Mother deceased of cancer.  Father deceased of a CVA.  She has 4 brothers who had deceased  and 6 other siblings that are  living, one of which is her brother with CAD and CABG.   CURRENT HOME MEDICATIONS:  1. Gabapentin 300 mg t.i.d.  2. Furosemide 100 mg in the morning and 80 mg in the evening.  3. Potassium 20 mEq daily.  4. Metformin 500 mg daily.  5. Labetalol 200 mg 1.5 tablets in the morning and 1 tablet in the      afternoon.  6. Sertraline 50 mg daily.  7. Isosorbide 30 mg daily.  8. Amlodipine 10 mg daily.  9. Simvastatin 40 mg daily.  10.Cozaar 50 mg daily.  11.B12 daily.   ALLERGIES:  Codeine, Darvocet, Demerol, morphine, penicillin, and sulfa.   PHYSICAL EXAMINATION:  VITAL SIGNS:  Blood pressure 155/50, pulse 64,  respirations 22, temperature 98.4, and O2 sat 96% on 2 L.  HEENT:  Head is normocephalic and atraumatic.  Eyes, PERRLA.  Mucous  membranes pink and moist.  Tongue is midline.  NECK:  Supple.  She is without bruit, but she does have positive JVD to  the mandible.  CARDIOVASCULAR:  Regular rate and rhythm with a soft S3.  Pulses are 2+  and equal without bruits.   LUNGS:  Diminished breath sounds bibasilar without wheezes or rhonchi.  ABDOMEN:  Soft, nontender, 2+ bowel sounds.  EXTREMITIES:  With nonpitting bilateral edema noted pretibially.  NEUROLOGIC:  Cranial nerves II through XII are grossly intact.   LABORATORY DATA:  Sodium 140, potassium 4.0, chloride 104, CO2 of 28,  BUN 10, creatinine 1.0, and glucose is not listed.  Hemoglobin 10.2 and  hematocrit 30.0.  CK 11.4, MB 1.3, troponin less than 0.5.   EKG revealed a normal sinus rhythm with ventricular rate of 62 beats per  minute.  Cheat x-ray revealed a markedly increased lung density probably  reflecting interstitial and early alveolar edema.   IMPRESSION:  1. Acute on chronic diastolic heart failure.  2. Anemia.  3. Coronary artery disease with history of coronary artery bypass      grafting in 2003.  4. Hypertension.  5. Diabetes.  6. History of gastrointestinal bleed.   This is a 77 year old Caucasian female with above-mentioned diagnoses  with complaints of chest pressure and increasing shortness of breath x1  month with chest pressure being very prominent this morning with some  relief with nitroglycerin.  The patient has a known coronary artery  disease with bypass grafting along with diastolic congestive heart  failure.  The patient has been seen and examined by myself and Dr.  Sherryl Manges in the emergency room, and the patient will be admitted.  Cardiac enzymes will be cycled and the patient will be diuresed.  The  patient has had a decrease in her hemoglobin from 12.0 to 10.2 within 1  month's time.  This may be dilutional versus actual bleeding.  We will  check hemoccult stools with her known history of gastrointestinal bleed.  She is not on any anticoagulations at this time.  We will also check an  anemia profile for blood work and consider stresses versus  repeat cath once the patient is stabilized.  This has been explained to  the patient, she verbalizes  understanding, is willing to be admitted.  We will make further recommendations depending upon hospital course and  the patient's response to treatment.      Bettey Mare. Lyman Bishop, NP      Duke Salvia, MD, [facility]  Electronically Signed    KML/MEDQ  D:  11/18/2007  T:  11/19/2007  Job:  [id number]   cc:   [name] D. Bean, FNP

## 2010-08-14 NOTE — Assessment & Plan Note (Signed)
[facility]                            CARDIOLOGY OFFICE NOTE   [name], [name]                      MRN:          [id number]  DATE:12/02/2007                            DOB:          1931/08/29    PRIMARY CARE PHYSICIAN:  Atha Starks. Bean, FNP   CLINICAL HISTORY:  Donna Reese is 77 years old and returned for post  hospital visit for management of her diastolic heart failure and  coronary disease.  She was admitted with chest pain and congestive heart  failure.  She was diuresed and her condition improved.  She underwent an  angiography and was found to have patent grafts.  Her left-sided filling  pressure was somewhat elevated, and she had an abdominal aortogram,  which showed bilateral renal artery stenosis.  She subsequently  underwent stenting of the left renal artery by Dr. Excell Seltzer.  She had been  on 4 medicines for hypertension.   She says she has done fairly well since discharge.  She has lost weight  on her home scale from 194 to 183, although and she feels somewhat weak.  She has had no major chest pain.   Her past medical history is significant for diabetes, hypertension, and  hyperlipidemia.  She has also had a previous bypass surgery and she has  had previous abdominal aneurysm surgery.  She also has peripheral  vascular disease, and she has had paroxysmal atrial fibrillation.  She  is not a Coumadin candidate due to GI bleeding.  She has also had a  history of deep vein thrombophlebitis and pulmonary embolism and has an  IV filter in place.   CURRENT MEDICATIONS:  1. Gabapentin 300 mg 3 times a day.  2. Normodyne 300 mg daily and 200 at bedtime.  3. Sertraline.  4. Amlodipine 10 mg daily.  5. Simvastatin 40 mg daily.  6. Vitamin B12.  7. Lasix 120 mg twice a day.  8. Potassium 40 mEq daily.  9. Plavix 75 mg daily.  10.Metformin 500 mg daily.   On examination, the blood pressure was 137/52 and pulse 60 and regular.  There  was no venous tension.  The carotid pulses were full with  bilateral bruits.  The chest was clear without rales or rhonchi.  Cardiac rhythm was regular.  There was 2/6 systolic ejection murmur.  The abdomen was soft with normal bowel sounds.  There was no  hepatosplenomegaly.  There was trace peripheral edema.   An EKG showed sinus rhythm and no evidence of any ST-T changes.   IMPRESSION:  1. Diastolic heart failure, now euvolemic or possibly hypovolemic.  2. Coronary artery disease, status post prior coronary bypass graft      surgery with recent patent graft.  3. Renovascular hypertension.  4. Renal artery stenosis, status post recent renal artery stenting on      the left.  5. History of paroxysmal atrial fibrillation, not a Coumadin      candidate.  6. History of deep vein thrombophlebitis and pulmonary embolism,      status post IV filter placement.  7. History of  gastrointestinal bleeding.  8. Diabetes.  9. Hypertension.  10.Hyperlipidemia.   RECOMMENDATIONS:  I think [name] is doing well.  Her blood pressure  is under good control and she has no volume overload.  She could be  somewhat over diuresed and is feeling somewhat weak, so we will plan to  cut back her Lasix from 120 twice a day to 80 twice a day and cut back  her potassium from 40 mEq to 20 mEq a day.  We will get a BMP, a BNP,  CBC and an iron-binding capacity today.  I will plan to see her back in  6 weeks.     Bruce Elvera Lennox Juanda Chance, MD, [facility]  Electronically Signed    BRB/MedQ  DD: 12/02/2007  DT: 12/03/2007  Job #: [id number]

## 2010-08-14 NOTE — H&P (Signed)
[name], Reese               ACCOUNT NO.:  [id number]   MEDICAL RECORD NO.:  [id number]          PATIENT TYPE:  INP   LOCATION:  NA                           FACILITY:  [facility]   PHYSICIAN:  Bruce R. Juanda Chance, MD, FACCDATE OF BIRTH:  1931-07-04   DATE OF ADMISSION:  04/09/2007  DATE OF DISCHARGE:                              HISTORY & PHYSICAL   PRIMARY CARE PHYSICIAN:  Dr. Everrett Coombe at [facility].   CHIEF COMPLAINT:  Shortness of breath.   CLINICAL HISTORY:  Donna Reese is 77 years old and came in today to  the office for an unscheduled visit because of increasing symptoms of  shortness of breath and chest pain for the past 2 days.  She says over  the past 2-3 days she has developed some increased shortness of breath  with slight cough and some increased edema of her lower extremities.  She says she has also had some substernal burning pain which has  radiated down to her left breast as well; this does not appear to be  related to exertion.  She has had a questionable fever associated with  this.   Her cardiac history is positive for a previous coronary artery bypass  surgery in 2002 and paroxysmal atrial fibrillation for which she takes  Coumadin therapy.   PAST MEDICAL HISTORY:  Significant for:  1. Deep vein thrombophlebitis and pulmonary embolism for which she is      on Coumadin.  2. She also has hypertension, hyperlipidemia and diabetes.  3. She is status post abdominal aneurysm repair and she has bilateral      carotid bruits.  4. She also has a history of diastolic heart failure.   CURRENT MEDICATIONS:  1. Coumadin as directed.  2. Cilostazol 100 mg b.i.d.  3. Sertraline 50 mg daily:  4. Cozaar 100 mg daily.  5. Furosemide 40 mg one and a half tablets daily.  6. Aspirin 81 mg daily.  7. Metformin 500 mg one-half tablet b.i.d.  8. Amlodipine 10 mg daily.  9. Simvastatin 40 mg daily.  10.Labetalol 200 mg t.i.d.   FAMILY HISTORY:  Positive for coronary  disease in that she had a brother  who had CABG and another brother had a myocardial infarction..   SOCIAL HISTORY:  She lives in [place] with her husband.  She was a  previous smoker until 62.  She does not drink.   REVIEW OF SYSTEMS:  Positive for some recent diarrhea and some urinary  incontinence.  She has also had questionable fever.   PHYSICAL EXAMINATION:  VITAL SIGNS:  Her blood pressure is 145/59 with a  pulse of 81 and regular, and the oxygen saturation was 88% on room air.  NECK:  The venous pulsations were visible 3 cm above the clavicle.  The  carotid pulses were full, but there are bilateral carotid bruits.  CHEST:  Had rales at both lung bases.  CARDIAC:  Rhythm was regular.  The heart sounds were diminished.  I  could hear no significant murmurs or gallops.  ABDOMEN:  Soft with  normal bowel sounds.  There is no hepatosplenomegaly and no bruits and  no pulsatile masses.  There is a midline abdominal scar.  EXTREMITIES:  There was 2+ peripheral edema.  The pedal pulses were  decreased, but equal.  MUSCULOSKELETAL:  System showed no deformities.  SKIN:  Warm and dry.  NEUROLOGIC:  Examination showed no focal neurological signs.   ELECTROCARDIOGRAM:  Showed intraventricular conduction delay, poor R-  wave progression and nonspecific ST-T changes.   CHEST X-RAY:  Showed no cardiomegaly and bilateral effusions and  congestive heart failure.   IMPRESSION:  1. Acute on chronic diastolic congestive heart failure.  2. Coronary artery disease, status post coronary bypass graft surgery,      2002.  3. Paroxysmal atrial fibrillation, on Coumadin therapy.  4. History of deep vein thrombophlebitis and pulmonary embolism, on      Coumadin therapy.  5. Hypertension.  6. Hyperlipidemia.  7. Diabetes.  8. Status post abdominal aneurysm repair.  9. Bilateral carotid bruits.   RECOMMENDATIONS:  Donna Reese has acute on chronic diastolic heart  failure with low oxygen  saturations.  I think she will need  hospitalization for treatment.  We plan to get her a bed at [facility] and we will start her on IV Lasix.      Bruce Elvera Lennox Juanda Chance, MD, [facility]  Electronically Signed     BRB/MEDQ  D:  04/09/2007  T:  04/09/2007  Job:  [id number]   cc:   Billie D. Bean, FNP

## 2010-08-14 NOTE — Discharge Summary (Signed)
NAMENOEMY, HALLMON               ACCOUNT NO.:  [id number]   MEDICAL RECORD NO.:  [id number]          PATIENT TYPE:  INP   LOCATION:  4706                         FACILITY:  [facility]   PHYSICIAN:  Everardo Beals. Juanda Chance, MD, FACCDATE OF BIRTH:  [birth year]   DATE OF ADMISSION:  11/18/2007  DATE OF DISCHARGE:  11/24/2007                               DISCHARGE SUMMARY   PRIMARY CARDIOLOGIST:  Everardo Beals. Juanda Chance, MD, [facility]   PRIMARY CARE PHYSICIAN:  Atha Starks. Bean, FNP   PROCEDURES PERFORMED DURING HOSPITALIZATION:  1. Cardiac catheterization dated November 20, 2007, performed by Dr.      Charlies Constable, revealing patent coronary artery bypass grafting,      increased pulmonary artery pressure and increased pulmonary artery      wedge, also found to have bilateral renal artery stenoses.  2. Status post stenting of the left renal artery per Dr. Tonny Bollman on November 23, 2007, using a Genesis 6.0 mm x 12 mm stent      with moderate right renal artery stenosis.  Please see Dr. Casimiro Needle      Cooper's note for more specific details.   FINAL DISCHARGE DIAGNOSES:  1. Acute-on-chronic diastolic congestive heart failure.  2. Coronary artery disease.      a.     Status post coronary artery bypass grafting in 2003, with       left internal mammary artery to left anterior descending and right       radial artery to the circumflex.      b.     Status post cardiac catheterization revealing patent stents       on November 20, 2007.  3. Renal artery stenosis.      a.     Status post stent to the left renal artery on November 23, 2007.  4. Paroxysmal atrial fibrillation (not a Coumadin candidate).  5. History of deep vein thrombosis and pulmonary embolism, status post      IVC filter.  6. Diabetes.  7. Hypertension.  8. Hyperlipidemia.   HISTORY OF PRESENT ILLNESS:  This is a 77 year old Caucasian female well  known to Dr. Juanda Chance with a history of CAD, CABG, and diastolic  congestive heart failure,  who had complaints of dyspnea the morning of  admission with associated chest pain and heaviness radiating to her back  and axillary area of the left arm and left right back.  Nitroglycerin  did help some.  She called Dr. Regino Schultze office who requested she come to  the emergency room and was seen promptly by myself and Dr. Sherryl Manges.  The patient states that the discomfort and symptoms were similar to the  pain that she experienced during CHF exacerbation, which she had in  January.  She has also noticed some fluid retention x1 week, and had  been more tired prior to the admission.  She was seen by Dr. Graciela Husbands who  admitted her to rule out myocardial infarction.  The patient was not a  candidate for Coumadin or anticoagulation secondary to  history of GI  bleed.  She had a history of PE and DVT and did have a IVC filter placed  in the past.   The patient was followed closely by [facility] Cardiology Team and was  diuresed.  The patient did well with her diuresis with weight loss and  fluid retention resolution.  The patient was found to be mildly  hypertensive at the beginning of her admission and medications were  adjusted.  It was advised by Dr. Juanda Chance that the patient has cardiac  catheterization to reevaluate her bypass grafting with recurrent chest  discomfort.  The patient did have catheterization as stated above.  Please see Dr. Regino Schultze thorough cardiac catheterization note for more  details.  It was found that her grafts were patent, but she did have  some renal artery stenosis on run off.  The patient was followed over  the weekend in [facility] to evaluate continued diuresis and plan for  renal artery stenting on the following Monday.  Throughout  hospitalization, the patient's labs were monitored and now she was found  to have an elevated D-dimer at 2.75 with subsequent CT scan to rule out  PE completed, which was found to be negative.   The patient did undergo renal artery  stenting per Dr. Tonny Bollman on  November 23, 2007, and had good results without any evidence of bleeding  or hematoma from the original catheterization site, which was done 3  days prior.  The patient was not restarted on any Coumadin and there was  no evidence of pulmonary embolus was found.  The patient was seen and  examined by Dr. Charlies Constable on the day of discharge and was found to be  stable with a blood pressure of 120/41 and a heart rate of 64.  The  patient will follow up with Dr. Juanda Chance in approximately 2 weeks.  Her  Lasix has been increased to 120 mg twice a day.  Plavix was added and  the patient's isosorbide was discontinued.   LABORATORY DATA ON DISCHARGE:  Sodium 140, potassium 4.1, chloride 105,  CO2 of 27, glucose 100, BUN 13, and creatinine 0.81.  Fecal occult blood  was found to be negative x3.  D-dimer was found to be 2.75.  Hemoglobin  10.0, hematocrit 30.0, white blood cells 5.6, and platelets are 176.  CT  scan of the chest dated November 22, 2007, revealed no pulmonary emboli  seen.  Changes of congestive heart failure, superimposed on changes of  COPD, chronic bronchitis, and mild mediastinal adenopathy.  EKG on  discharge, normal sinus rhythm with a ventricular rate of 63 beats per  minute.   DISCHARGE MEDICATIONS:  1. Plavix 75 mg 1 p.o. daily.  2. Gabapentin 30 mg 3 times a day.  3. Normodyne 300 mg daily and 200 mg at bedtime daily.  4. Sertraline 50 mg daily.  5. Amlodipine 10 mg daily.  6. Simvastatin 40 mg daily.  7. Vitamin B12 daily.  8. Lasix 120 mg twice a day (new increased dose).  9. Potassium 40 mEq daily.   ALLERGIES:  CODEINE, PENICILLIN, DARVON, SULFA and MORPHINE.   FOLLOWUP PLANS AND APPOINTMENT:  1. The patient will follow up with Dr. Charlies Constable on December 03, 2007, at 2:45 p.m.  2. The patient will follow up with her primary care physician for      continued medical management.  3. The patient has been given post cardiac  catheterization  instructions with particular emphasis on the right groin site for      evidence of bleeding hematoma or signs of infection.  4. The patient has been advised on increased dose of Lasix and      prescription has been provided along with discontinuation of      isosorbide and addition of Plavix.   TIME SPENT WITH THE PATIENT TO INCLUDE PHYSICIAN TIME:  40 minutes.      Bettey Mare. Lyman Bishop, NP      Everardo Beals. Juanda Chance, MD, [facility]  Electronically Signed    KML/MEDQ  D:  11/24/2007  T:  11/25/2007  Job:  [id number]   cc:   [name] D. Bean, FNP

## 2010-08-14 NOTE — Assessment & Plan Note (Signed)
[facility]                            CARDIOLOGY OFFICE NOTE   ARLIN, SAVONA                      MRN:          [id number]  DATE:04/30/2007                            DOB:          04-27-1931    PRIMARY CARE PHYSICIAN:  Everrett Coombe at [facility].   CLINICAL HISTORY:  Donna Reese is a 77 year old who returned for  followup management of her coronary heart disease and congestive heart  failure after a recent hospitalization.  We had seen her in the office  with symptoms of increasing shortness of breath, and she was in  congestive heart failure, and we admitted her to the hospital.   LABORATORIES:  She had a hemoglobin 7.5.  She was transfused and seen by  gastroenterology.  She had an endoscopy which showed only atrophic  gastritic mucosa, but no bleeding site.  She had recently had a  colonoscopy, a year ago, and it was felt that this did not need to be  repeated.  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   Since discharge she has done reasonably well.  She still says she feels  quite weak.  She said that when she gets up, and sits up, she is quite  lightheaded and dizzy; and her blood pressures have been low at home and  the 100 range.   CURRENT MEDICATIONS INCLUDE:  Aspirin, labetalol,  _________, tramadol,  metformin, Norvasc, Pletal, Zoloft, Cozaar, Lasix, Lipitor, Ecotrin,  colchicine, and nitroglycerin.   PHYSICAL EXAMINATION:  Today, the blood pressure is 117/44; and the  pulse 69 and regular.  There was no venous distention.  The carotid pulses were full.  CHEST:  Clear without rales or rhonchi.  HEART:  Rhythm was regular with a short murmur at the left sternal edge.  ABDOMEN:  Soft.  Normal bowel sounds.  Peripheral pulses were full, and there was 1+ edema in the left lower  extremity which was mostly nonpitting, and no significant erythema in  the right extremity.   An  electrocardiogram showed sinus rhythm and poor R-wave progression.   IMPRESSION:  1. Acute anemia with iron deficiency felt most probably secondary to      gastrointestinal blood loss from AV malformations with negative      endoscopy.  2. Acute diastolic congestive heart failure, now improved.  3. Coronary artery disease status post coronary bypass graft surgery.  4. Status post abdominal aortic aneurysm repair.  5. History of deep vein thrombophlebitis and pulmonary embolism with      previous Coumadin therapy, currently not on Coumadin.  6. Hypertension.  7. Hyperlipidemia.  8. Diabetes.  9. History of gout.   RECOMMENDATIONS:  Ms. Priestly is still feeling kind of puny.  It may be  related to the blood pressure being low from her medications being too  strong.  We will cut the Cozaar back from 50 mg to 25 mg daily, and we  will cut the amlodipine from of 10 mg to 5 mg daily.  We will get a CBC,  BMP,  and BNP today.  I will go ahead and start her on Coumadin 2 mg a  day, and we will target her Coumadin INRs from a 1.7-2.7.  I will plan  to see her back in 4 weeks.     Bruce Elvera Lennox Juanda Chance, MD, [facility]  Electronically Signed    BRB/MedQ  DD: 04/30/2007  DT: 05/01/2007  Job #: [id number]

## 2010-08-14 NOTE — H&P (Signed)
NAMELUCILL, Donna Reese               ACCOUNT NO.:  [id number]   MEDICAL RECORD NO.:  [id number]          PATIENT TYPE:  INP   LOCATION:  4706                         FACILITY:  [facility]   PHYSICIAN:  Everardo Beals. Juanda Chance, MD, FACCDATE OF BIRTH:  31-Jul-1931   DATE OF ADMISSION:  11/18/2007  DATE OF DISCHARGE:  11/24/2007                              HISTORY & PHYSICAL   CHIEF COMPLAINT:  Shortness of breath.   CLINICAL HISTORY:  Donna Reese is 77 years old and came into the  office today for an unscheduled visit because of increasing shortness of  breath.  Her shortness of breath has been increasing over the last 5  days and she has also had some increased swelling associated with a  productive cough which has had some green sputum.  She has had no chest  pain.   She has a history of prior bypass surgery and has had a history of  diastolic heart failure.  She had a catheterization done earlier this  year which showed patent grafts.   She also has hypertension and renal artery stenosis and underwent  stenting of the left renal artery by Dr. Excell Seltzer.   PAST MEDICAL HISTORY:  Significant for diabetes, hypertension, and  hyperlipidemia.  She has had a previous abdominal aneurysm surgery.  She  has peripheral vascular disease.  She has had paroxysmal atrial  fibrillation, but she is not felt to be a Coumadin candidate due to  recurrent GI bleeding.  She has a history of deep vein thrombosis and  pulmonary embolus and has an IV filter in place.   CURRENT MEDICATIONS:  1. Gabapentin 300 mg t.i.d.  2. K-Dur 20 mEq daily.  3. Sertraline 50 mg daily.  4. Amlodipine 10 mg daily.  5. Simvastatin 40 mg daily.  6. Plavix 75 mg daily.  7. Lasix 80 mg b.i.d.  8. Labetalol 200 mg t.i.d.  9. Metformin 500 mg daily.  10.Vitamin B12.  11.Aspirin 81 mg daily.   SOCIAL HISTORY:  She lives with her husband.  She does not smoke.   FAMILY HISTORY:  Positive for coronary heart disease.   REVIEW OF  SYSTEMS:  Positive for symptoms of fatigue and cough.   PHYSICAL EXAMINATION:  VITAL SIGNS:  Blood pressure was 104/41 and pulse  65 and regular.  O2 saturation was 85% on room air.  NECK:  Venous pulsation was visible up to the jaw.  The carotid pulses  were full.  There was a right carotid bruit.  CHEST:  Had basilar rales and decreased breath sounds.  CARDIAC:  Rhythm was regular.  There was short systolic murmur at the  left sternal edge.  I could see no diastolic murmur or gallop.  ABDOMEN:  Soft.  There were normal bowel sounds.  There was no  hepatosplenomegaly.  There was 2+ peripheral edema.  Pedal pulses were  equal.  MUSCULOSKELETAL SYSTEM:  No deformities.  SKIN:  Warm and dry.  NEUROLOGIC:  No focal neurological signs.   LABORATORY DATA:  An electrocardiogram showed sinus rhythm with possible  old septal infarct and nonspecific ST-T changes.  IMPRESSION:  1. Acute on chronic diastolic heart failure with hypoxemia.  2. Acute bronchitis.  3. Coronary artery disease status post coronary artery bypass graft      surgery with recent documentation of patent grafts.  4. Renovascular hypertension.  5. Status post recent stenting of left renal artery.  6. History of paroxysmal atrial fibrillation, not a Coumadin      candidate.  7. History of deep vein thrombosis and pulmonary embolism status post      IV filter placement.  8. History of gastrointestinal bleeding.  9. Diabetes.  10.Hypertension.  11.Hyperlipidemia.   RECOMMENDATIONS:  Donna Reese, I think has both acute bronchitis and  acute diastolic heart failure.  She is hypoxemic and in some distress  and I think hospitalization is the best course.  We will plan to admit  her and initially treat her with IV Lasix and we will start her on Z-Pak  and Robitussin for her bronchitis.  We will get a chest x-ray here in  office prior to admission.      Bruce Elvera Lennox Juanda Chance, MD, [facility]  Electronically Signed     BRB/MEDQ   D:  03/16/2008  T:  03/16/2008  Job:  [id number]   cc:   Billie D. Bean, FNP

## 2010-08-14 NOTE — Discharge Summary (Signed)
Donna Reese, Donna Reese NO.:  [id number]   MEDICAL RECORD NO.:  [id number]          PATIENT TYPE:  INP   LOCATION:  2014                         FACILITY:  [facility]   PHYSICIAN:  Bevelyn Buckles. Bensimhon, MDDATE OF BIRTH:  1932-03-02   DATE OF ADMISSION:  03/16/2008  DATE OF DISCHARGE:  03/20/2008                               DISCHARGE SUMMARY   PRIMARY CARDIOLOGIST:  Everardo Beals. Juanda Chance, MD, [facility]   PRIMARY CARE Tali Coster:  Atha Starks. Bean, FNP   DISCHARGE DIAGNOSIS:  Acute on chronic diastolic congestive heart  failure.   SECONDARY DIAGNOSES:  1. Acute bronchitis.  2. Coronary artery disease status post coronary artery bypass graft      with recent documentation of patent grafts.  3. History of renovascular hypertension.  4. Status post renal stenting (left renal artery).  5. Paroxysmal atrial fibrillation on aspirin and Plavix.  She is a      poor Coumadin candidate.  6. History of DVT and pulmonary embolism status post IVC filter.  7. History of GI bleeding.  8. Diabetes.  9. Hypertension.  10.Hyperlipidemia.   ALLERGIES:  CODEINE, PENICILLIN, PROPOXYPHENE, SULFA, and MORPHINE.   PROCEDURE:  None.   HISTORY OF PRESENT ILLNESS:  A 77 year old Caucasian female who saw  Brodie in clinic on December 16 secondary to progressive dyspnea over  the previous 5 days as well as increased swelling, productive cough, and  green sputum.  There was no history of chest pain.  In the office, she  was felt to be volume overloaded with JVP up to the jaw and she was  admitted for further evaluation.   HOSPITAL COURSE:  The patient had negative cardiac markers.  Her  admission BNP was 446.0.  She is placed on Lasix 80 mg IV b.i.d. with  fair diuresis which subsequently proved with the addition of Zaroxolyn.  She was hypertensive.  During her admission she was initiated on  angiotensin-receptor blocker therapy.  She has tolerated this and her  blood pressure has improved  significantly.  With diuresis, she has been  ambulating without difficulty and has had improvement in her dyspnea.  She will be discharged home today in good condition.   DISCHARGE LABORATORY DATA:  Hemoglobin 11.2, hematocrit 34.4, WBC 5.6,  platelets 140, sodium 140, potassium 3.7, chloride 96, CO2 33, BUN 25,  creatinine 1.50, glucose 93, total bilirubin 1.0, alkaline phosphatase  87, AST 14 , ALT less than 8, total protein 6.2, albumin 3.6. calcium  8.4, CK 70, MB 2.4, troponin I 0.02, BMP 446.0.   DISPOSITION:  The patient will be discharged home today in good  condition.   FOLLOWUP PLANS AND APPOINTMENTS:  We will arrange for followup with Dr.  Juanda Chance in approximately 1-2 weeks.  At that time, we will follow a BMET.  She is asked to follow up with Everrett Coombe, nurse practitioner, as  previously scheduled.   DISCHARGE MEDICATIONS:  1. Lasix 80 mg b.i.d.  2. Cozaar 50 mg daily.  3. Imdur 30 mg daily.  4. Labetalol 200 mg b.i.d.  5. Potassium chloride 40 mEq daily.  6. Norvasc 10 mg daily.  7. Vitamin B12 500 mcg daily.  8. Metformin 500 mg b.i.d.  9. Sertraline 50 mg every day.  10.Simvastatin 40 mg q.h.s.  11.Gabapentin 300 mg t.i.d.  12.Hydrocodone/acetaminophen 1 q.6 h. p.r.n. as previously prescribed.  13.Plavix 75 mg daily.  14.Aspirin 81 mg daily.  15.Nitroglycerin 0.4 mg sublingual p.r.n. chest pain.   OUTSTANDING LAB STUDIES:  None.   DURATION OF DISCHARGE/ENCOUNTER:  Sixty minutes including physician  time.      Nicolasa Ducking, ANP      Bevelyn Buckles. Bensimhon, MD  Electronically Signed    CB/MEDQ  D:  03/20/2008  T:  03/21/2008  Job:  [id number]   cc:   [name] D. Bean, FNP

## 2010-08-14 NOTE — Consult Note (Signed)
NAMEJAMISHA, Donna Reese NO.:  [id number]   MEDICAL RECORD NO.:  [id number]          PATIENT TYPE:  INP   LOCATION:  3715                         FACILITY:  [facility]   PHYSICIAN:  Bevelyn Buckles. Bensimhon, MDDATE OF BIRTH:  1931/12/31   DATE OF CONSULTATION:  06/28/2008  DATE OF DISCHARGE:                                 CONSULTATION   REQUESTING PHYSICIAN:  Valerie A. Felicity Coyer, M.D.   PRIMARY CARE Larhonda Dettloff:  Myer Peer, nurse practitioner.   CARDIOLOGISTEverardo Beals Juanda Chance, MD, [facility].   REASON FOR CONSULTATION:  Severe orthostatic hypotension with multiple  falls.   [name] is a 77 year old woman with multiple medical problems  including coronary artery disease status post bypass surgery in 2003,  hypertension, diabetes, and severe diastolic heart failure.   [name] has been followed closely by Dr. Juanda Chance and [name]  in the heart failure clinic due to severe diastolic heart failure with  chronic shortness of breath.  She underwent cardiac catheterization in  August 2009 that showed severe native coronary artery disease with  patent grafts.  Her right atrial pressure had a mean of 13, PA pressure  was 65/26 with a mean of 45, her wedge pressure was 32, her Fick cardiac  index was 3.3 liters per minute per meter square.  Subsequent to that,  her Lasix was increased and her dyspnea improved somewhat.   In February she began to develop headaches and severe orthostatic  lightheadedness.  She had a complete workup with a cardiac MRI and MRA  which was normal.  Unfortunately, her orthostasis continued to get worse  over the past week or 2 and now most of the time when she stands up she  ends up falling.  She has only had 1 episode where she actually had loss  of consciousness.  This was transient and there was no seizure activity.  She finally was admitted to further address these symptoms.  On  admission, her systolic blood pressure was 193/59 lying down  with a  heart rate of 52, and dropped to 112/57 with a heart rate of 64.  We are  asked to consult.   REVIEW OF SYSTEMS:  She does have a history of atrial fibrillation, but  is not on Coumadin due to previous GI bleed due to gastric AVMs.  She  also has a history of DVT with previous DVT and pulmonary embolus with  previous IVC filter.  She does have occasional palpitations but nothing  sustained, and these are not associated with her orthostatic symptoms.  She currently denies any orthopnea or PND.  She does have chronic  dyspnea.  No chest pain.  Remainder of the review of systems is negative  except for HPI and problem list.   PROBLEM LIST:  1. Diastolic heart failure:  Most recent echocardiogram January 2010      showed an EF of 60%.  There was mild LVH with a pseudo-normal      filling pattern.  Her aortic valve was thickened, but no      significant gradient.  Peak pulmonary pressures  were in the mid      50s.  There was significant biatrial enlargement.  2. Coronary artery disease.      a.     Status post CABG 2003.      b.     Last catheterization August 2009 with patent grafts.  3. Paroxysmal atrial fibrillation, not on Coumadin due to history of      GI bleeding.  4. History of DVT and PE status post IVC filter.  5. History of peripheral arterial disease, renal artery stenosis      status post left renal stent.  6. History of abdominal aortic aneurysm status post repair.  7. Hyperlipidemia.  8. Hypertension.  9. Diabetes.  10.Carotid artery disease with 40-50% stenosis.   CURRENT MEDICATIONS:  1. Lasix 80 b.i.d.  2. Aspirin 81 daily.  3. Cozaar 50 daily.  4. Gabapentin 300 t.i.d.  5. Labetalol 200 t.i.d.  6. Potassium 20 daily.  7. Vytorin 10/40.  8. Plavix 75 daily.  9. Cipro.   SOCIAL HISTORY:  She lives with her husband.  She is married.  She is  retired.  She does not smoke or drink currently.   FAMILY HISTORY:  Noncontributory.   PHYSICAL EXAMINATION:   She is sitting in chair in no acute distress.  Orthostatic blood pressures as above.  Her saturations are in the high  90s on room air.  HEENT:  Normal.  NECK:  Supple.  There is no JVD.  Carotids are 2+ bilaterally with  bilateral radiated bruits.  There is no lymphadenopathy or thyromegaly.  CARDIAC:  PMI is nondisplaced.  Regular rate and rhythm with a 2/6  systolic ejection murmur at the right sternal border and it is only  mildly diminished.  LUNGS:  Clear.  ABDOMEN:  Soft, nontender, nondistended.  No hepatosplenomegaly, no  bruits.  No masses appreciated.  EXTREMITIES:  Warm, with no cyanosis, clubbing or edema.  No rash.  NEURO:  Alert and oriented x3.  Cranial nerves II-XII are grossly  intact.  Moves all 4 extremities without difficulty.  Affect is  pleasant.   EKG shows sinus bradycardia at a rate of 53 with anteroseptal Q-waves  and nonspecific ST flattening.  White count is 5.2, hemoglobin 10.8,  platelets of 138.  Sodium 140, potassium 3.6, BUN of 25, creatinine is  1.5.  Troponin is negative.  Sed rate is 19.  UA shows possible urinary  tract infection.   ASSESSMENT:  1. Severe orthostatic hypotension with recurrent falls.  2. Severe diastolic heart failure with tenuous volume status requiring      high dose diuretics.  3. Coronary artery disease status post bypass surgery, all grafts      patent by last catheterization August 2009.  4. Urinary tract infection.   PLAN/DISCUSSION:  This is very difficult situation.  She has severe  symptomatic orthostasis in the setting of marked resting hypertension  and diastolic heart failure with difficult to control volume status.  At  this point I would recommend:  1. Adding low dose midodrine at 2.5 mg b.i.d. to help with orthostasis      and treating her resting hypertension as needed.  2. Compression hose, as you have ordered.  3. Decrease standing Lasix dose to 40 b.i.d. and see how she does.  4. Gentle IV fluids, as you  have done already.  5. Consider Mestinon as needed per Dr. Graciela Husbands.  6. We will check an a.m. cortisol.   We appreciate the consult.  We will continue to follow along with you.      Bevelyn Buckles. Bensimhon, MD  Electronically Signed     DRB/MEDQ  D:  06/28/2008  T:  06/28/2008  Job:  [id number]

## 2010-08-14 NOTE — H&P (Signed)
NAMETRULEE, HAMSTRA NO.:  [id number]   MEDICAL RECORD NO.:  [id number]          PATIENT TYPE:  EMS   LOCATION:  MAJO                         FACILITY:  [facility]   PHYSICIAN:  Michiel Cowboy, MDDATE OF BIRTH:  [birth year]   DATE OF ADMISSION:  06/26/2007  DATE OF DISCHARGE:                              HISTORY & PHYSICAL   PRIMARY CARE Keandra Medero:  Garth Bigness.   CHIEF COMPLAINT:  Chest pain.   The patient is a 77 year old female with past medical history  significant for coronary artery disease and chronic anemia thought  secondary to AV malformations, status post workup.  The patient  presented today with onset of chest pain on the left side of the chest  radiating to left arm and left side of the jaw.  It reminds her of the  chest pain she had with her heart attack but not as severe.  The patient  denies shortness of breath or diaphoresis.  Has had presyncopal episode  a couple of days back.  Reports black stools, which she attributes to  taking iron pills.  The patient was noted to be severely anemic,  hemoglobin down to 7.1.  Cardiology was initially consulted but wanted  the patient to be admitted to primary care.  [facility] called to  admit for [place] overnight.   REVIEW OF SYSTEMS:  Otherwise negative except for as in HPI.   PAST MEDICAL HISTORY:  1. Previous coronary artery bypass surgery.  2. Abdominal aortic aneurysm repair.  3. History of deep vein thrombosis and pulmonary embolism, for which      she is on Coumadin.  4. Hypertension.  5. Hyperlipidemia.  6. History of diabetes.  7. Gout.   SOCIAL HISTORY:  The patient lives at home with her husband.  Denies  alcohol, drug or tobacco use.  Used to smoke but quit in 1992.   FAMILY HISTORY:  Noncontributory.   ALLERGIES:  The patient allergic to CODEINE, DARVOCET, DEMEROL,  MORPHINE, PENICILLIN, and SULFA.   MEDICATIONS:  1. Norvasc 10 mg p.o. once a day.  2.  Cilostazol 100 mg p.o. twice a day.  3. Coumadin 2 mg p.o. once a day.  4. Cozaar 100 mg p.o. once a day.  5. Neurontin 300 mg p.o. three times a day.  6. Hydrocodone as needed for pain.  7. Potassium once a day.  8. Labetalol 200 mg p.o. twice a day.  9. Lasix 60 mg p.o. once a day.  10.Metformin 500 mg p.o. twice a day.  11.Nitroglycerin as needed.  12.Sertraline 50 mg p.o. once a day.  13.Simvastatin 40 mg p.o. at bedtime.   PHYSICAL EXAM:  VITAL SIGNS:  Temperature 97.7, up to 100.4.  Blood  pressure 156/41.  Pulse 89.  Respirations 24.  Saturating 100% on room  air.  The patient is alert and oriented, in no acute distress.  LUNGS:  Clear to auscultation bilaterally.  HEART:  Loud systolic 3/6 murmur noted.  LOWER EXTREMITIES:  With trace edema.  ABDOMEN:  Nontender, nondistended.  NEUROLOGIC:  Intact.   LABS:  Significant  for hemoglobin of 7.1, white blood cell count 6.6,  platelets 178.  Sodium 139, potassium 3.6, creatinine 1.3.  Cardiac  enzymes x1 negative.  BNP 206.  Chest x-ray shows mild pulmonary edema  but no pneumonia.  INR elevated at 2.8.  EKG showing no acute infarction  or ischemia.   ASSESSMENT AND PLAN:  This is a 77 year old female with a history of  coronary artery disease on Coumadin for a remote history of deep vein  thrombosis and pulmonary embolus, with chronic anemia felt to be  secondary to old arteriovenous malformations.  Presents with anemia and  angina.   1. Angina, rule out myocardial infarction.  Serial cardiac enzymes and      EKG.  Cardiology is to follow up in the morning.  Currently chest      pain-free.  Will admit to telemetry.  We will discontinue      nitroglycerin drip.  Patient already on Coumadin.  Would not start      Lovenox.  2. Anemia.  The patient has had gastroenterology workup in the past.      Of note, her Coumadin was held in the past secondary to anemia but      then was restarted with a goal of 1.7-2.7, currently INR  is 2.8.      We will transfuse two units of packed red blood cells, hold      cilostazol and hold Coumadin for now.  3. History of diabetes.  Sliding scale insulin.  4. History of congestive heart failure.  We will do strict I&O's,      follow BNP, continue Lasix, increase to 60 mg twice a day.  5. Hypertension.  Continue home medications.  6. History of deep vein thrombosis/pulmonary embolus, which is remote.      We will hold off on Coumadin for now.  7. History of coronary artery disease.  Continue Lipitor and beta      blocker.  8. History of neuropathy.  We will continue Neurontin and hydrocodone      as needed for pain.  9. Depression.  Continue sertraline.  10.Low-grade fever.  Cause unclear, but we will obtain UA, urine      culture, blood culture.  Chest x-ray did not show pneumonia.  11.Patient is Do Not Intubate only.  12.Prophylaxis:  Protonix.  The patient is already on Coumadin with      elevated INR.  Once INR comes down, we will need to do SCDs.      Michiel Cowboy, MD  Electronically Signed     AVD/MEDQ  D:  06/26/2007  T:  06/26/2007  Job:  [id number]   cc:   Everardo Beals. Juanda Chance, MD, [facility]

## 2010-08-15 ENCOUNTER — Encounter: Payer: Self-pay | Admitting: Physician Assistant

## 2010-08-15 ENCOUNTER — Ambulatory Visit (INDEPENDENT_AMBULATORY_CARE_PROVIDER_SITE_OTHER): Payer: Medicare Other | Admitting: Physician Assistant

## 2010-08-15 ENCOUNTER — Other Ambulatory Visit (INDEPENDENT_AMBULATORY_CARE_PROVIDER_SITE_OTHER): Payer: Medicare Other | Admitting: *Deleted

## 2010-08-15 DIAGNOSIS — E876 Hypokalemia: Secondary | ICD-10-CM

## 2010-08-15 DIAGNOSIS — I5030 Unspecified diastolic (congestive) heart failure: Secondary | ICD-10-CM

## 2010-08-15 DIAGNOSIS — R079 Chest pain, unspecified: Secondary | ICD-10-CM

## 2010-08-15 DIAGNOSIS — I701 Atherosclerosis of renal artery: Secondary | ICD-10-CM

## 2010-08-15 DIAGNOSIS — N259 Disorder resulting from impaired renal tubular function, unspecified: Secondary | ICD-10-CM

## 2010-08-15 LAB — BASIC METABOLIC PANEL
CO2: 34 mEq/L — ABNORMAL HIGH (ref 19–32)
GFR: 21.85 mL/min — ABNORMAL LOW (ref >60.00)
Glucose, Bld: 86 mg/dL (ref 70–99)
Potassium: 4.4 mEq/L (ref 3.5–5.1)
Sodium: 137 mEq/L (ref 135–145)

## 2010-08-15 NOTE — Assessment & Plan Note (Signed)
Basic metabolic panel pending.

## 2010-08-15 NOTE — Assessment & Plan Note (Addendum)
Chest pain is atypical.  She cannot take aspirin due to history of GI bleeding.  She had patent grafts at cardiac catheterization in August 2009.  I discussed this with Dr. Excell Seltzer.  She's not really a candidate for PCI as she cannot take aspirin.  No medication changes will be made today.  Continue to monitor her.  She will follow up in 4 weeks as noted.

## 2010-08-15 NOTE — Assessment & Plan Note (Addendum)
I discussed her case is a with her primary cardiologist, Dr. Excell Seltzer who followed her in the hospital.  It seems that her dyspnea is probably multifactorial.  Her creatinine increased recently.  She may not need as much diuretic as what she currently takes.    She has a basic metabolic panel pending from today.  We will see how her creatinine is improving.  Her weight at home has been fairly stable.  By our scales, she is up 6 pounds from the hospital.  However, she seems stable overall.  Continue current medications for now.

## 2010-08-15 NOTE — Patient Instructions (Addendum)
Your physician has requested that you have a renal artery duplex. During this test, an ultrasound is used to evaluate blood flow to the kidneys 588.9, 440.1 renal artery stenosis/insufficiency. Allow one hour for this exam. Do not eat after midnight the day before and avoid carbonated beverages. Take your medications as you usually do.  Your physician recommends that you return for lab work in: TODAY BMET 428.30  Your physician recommends that you return for lab work in: [employer] IN 1 WEEK 08/22/10.  Your physician recommends that you schedule a follow-up appointment in: 1 MONTH WITH DR. Excell Seltzer PER [address], PA-C

## 2010-08-15 NOTE — Assessment & Plan Note (Signed)
Her creatinine is up.  She has a history of left renal artery stenting.  We will arrange renal artery Dopplers to rule out restenosis.  She will see Dr. Excell Seltzer back in about 4 weeks.

## 2010-08-15 NOTE — Progress Notes (Signed)
History of Present Illness: Primary Cardiologist: Dr. Tonny Bollman  Donna Reese is a 77 y.o. female With a history of CAD, status post CABG with patent grafts by cath in 8/09, renovascular hypertension, status post left renal artery stenting 8/09, diastolic heart failure, atrial fibrillation, status post IVC filter in the setting of DVT and pulmonary embolism who is not a Coumadin or aspirin candidate secondary to GI bleeding.  She was admitted 4/30 from the office with acute on chronic diastolic heart failure.  Her BNP was not actually that high but she did improve with diuresis.  Her Lasix was changed to torsemide.  She was asked to take metolazone as needed.  Her creatinine did increase.  Her BNP was not significantly elevated.  She did have a small right pleural effusion noted on chest x-ray 5/1.  Since discharge from the hospital, she has seen her PCP on 2 occasions.  Her creatinine had increased and her torsemide dose was cut back for a few days.  She had another basic metabolic panel today.  Her breathing is really unchanged.  She continues to have lower extremity edema.  She sleeps on 2 pillows.  She denies PND.  She denies syncope.  She describes NYHA class III symptoms.  She is on chronic O2 with her COPD.  She does note some occasional left-sided chest pains.  These are somewhat atypical.  She denies exertional chest discomfort.  Past Medical History  Diagnosis Date  . Diabetes mellitus   . COPD (chronic obstructive pulmonary disease)   . Diverticulosis   . Osteopenia   . PUD (peptic ulcer disease)   . Peripheral arterial disease   . Hypertension   . Hyperlipidemia   . Diastolic heart failure     a. echo 9/11: EF 60-65%, mild LVH, grade 2 diast dysfxn, AV mean gradient 9 mmHg, mild MR, RVE, RVSP 59  . Coronary artery disease     a. s/p CABG;  b. cath 8/09: LM occluded; RCA 50%; L-LAD ok, Radial-OM ok; bilat renal art stenosis  . Renovascular hypertension   . Renal artery stenosis      a. s/p L RA stent 8/09  . Paroxysmal a-fib     no coumadin 2/2 GI Blee  . DVT (deep venous thrombosis)   . Pulmonary embolus     s/p IVC filter  . History of gastrointestinal bleeding     no ASA or coumadin  . Contrast dye induced nephropathy   . Orthostatic hypotension     Current Outpatient Prescriptions  Medication Sig Dispense Refill  . amLODipine (NORVASC) 10 MG tablet Take 10 mg by mouth daily.        Marland Kitchen ezetimibe-simvastatin (VYTORIN) 10-20 MG per tablet Take 1 tablet by mouth at bedtime.        . ferrous sulfate 325 (65 FE) MG tablet Take 325 mg by mouth daily with breakfast.        . isosorbide mononitrate (IMDUR) 30 MG 24 hr tablet Take 1 tablet (30 mg total) by mouth daily.  30 tablet  11  . ketorolac (ACULAR) 0.4 % SOLN Place 1 drop into the right eye 4 (four) times daily.        Marland Kitchen latanoprost (XALATAN) 0.005 % ophthalmic solution Place 1 drop into both eyes daily.        Marland Kitchen losartan (COZAAR) 100 MG tablet Take 1 tablet (100 mg total) by mouth daily.  30 tablet  11  . metolazone (ZAROXOLYN) 5 MG tablet  Use as directed, 30 minutes before AM Torsemide -- ONLY IF YOU HAVE A 3 POUND WEIGHT GAIN  30 tablet  2  . metoprolol (TOPROL-XL) 50 MG 24 hr tablet Take 50 mg by mouth daily.        . mirtazapine (REMERON) 15 MG tablet TAKE 1 TABLET BY MOUTH AT BEDTIME  30 tablet  5  . Mometasone Furo-Formoterol Fum (DULERA) 100-5 MCG/ACT AERO Inhale 1 puff into the lungs daily.        . nitroGLYCERIN (NITROSTAT) 0.4 MG SL tablet Place 0.4 mg under the tongue every 5 (five) minutes as needed.        . NON FORMULARY 2 L/min by Nasal route continuous. oxygen       . omeprazole (PRILOSEC) 40 MG capsule Take 40 mg by mouth daily.        . OXYCODONE HCL PO Take 5 mg by mouth daily. As needed for pain       . potassium chloride (KLOR-CON) 10 MEQ CR tablet Take 10 mEq by mouth daily.        Marland Kitchen tiotropium (SPIRIVA) 18 MCG inhalation capsule Place 18 mcg into inhaler and inhale daily.        Marland Kitchen  torsemide (DEMADEX) 20 MG tablet Take 1 tablet (20 mg total) by mouth 2 (two) times daily.  40 tablet  0  . triamcinolone (KENALOG) 0.1 % cream Apply topically 2 (two) times daily.  454 g  1    Allergies: Allergies  Allergen Reactions  . Codeine   . Meperidine Hcl   . Morphine   . Niacin     REACTION: rash  . Penicillins   . Propoxyphene Hcl   . Sulfonamide Derivatives     Vital Signs: BP 144/52  Pulse 57  Ht 5\' 4"  (1.626 m)  Wt 174 lb (78.926 kg)  BMI 29.87 kg/m2  PHYSICAL EXAM: Well nourished, well developed, in no acute distress HEENT: normal Neck: no JVD At 45 Cardiac:  normal S1, S2; Irregularly irregular Lungs:  clear to auscultation bilaterally, no wheezing, rhonchi or rales Abd: soft, nontender, no hepatomegaly Ext: 1+ nonpitting edema Skin: warm and dry Neuro:  CNs 2-12 intact, no focal abnormalities noted  EKG:  Atrial fibrillation, heart rate 57, nonspecific ST-T wave changes  ASSESSMENT AND PLAN:

## 2010-08-16 ENCOUNTER — Ambulatory Visit (INDEPENDENT_AMBULATORY_CARE_PROVIDER_SITE_OTHER): Payer: Medicare Other | Admitting: Family Medicine

## 2010-08-16 ENCOUNTER — Encounter: Payer: Self-pay | Admitting: Family Medicine

## 2010-08-16 ENCOUNTER — Other Ambulatory Visit: Payer: Medicare Other

## 2010-08-16 DIAGNOSIS — I4891 Unspecified atrial fibrillation: Secondary | ICD-10-CM

## 2010-08-16 DIAGNOSIS — N259 Disorder resulting from impaired renal tubular function, unspecified: Secondary | ICD-10-CM

## 2010-08-16 DIAGNOSIS — J449 Chronic obstructive pulmonary disease, unspecified: Secondary | ICD-10-CM

## 2010-08-16 DIAGNOSIS — J4489 Other specified chronic obstructive pulmonary disease: Secondary | ICD-10-CM

## 2010-08-16 DIAGNOSIS — I5022 Chronic systolic (congestive) heart failure: Secondary | ICD-10-CM

## 2010-08-16 NOTE — Patient Instructions (Signed)
Decrease your TORSEMIDE to 1 tablet twice a day If you gain more than 3 pounds or get worsening shortness of breath, then increase back to 2 tablets twice a day  OK to get your lab check next week at our office, I will send to Dr. Meridee Score in 3-4 weeks

## 2010-08-16 NOTE — Progress Notes (Signed)
  Subjective:    Patient ID: Donna Reese, female    DOB: 06-Jul-1931, 77 y.o.   MRN: [id number]  HPI  77 year old female, very well known to me, status post hospital discharge on 08/01/2010. She was admitted with a congestive heart failure exacerbation, worsening shortness or breath and failure to improve with outpatient management.  The patient also has significant COPD, O2 dependent. Significant history for renal insufficiency which has been worsening in the acute setting over the last month, and additionally she has multiple medical problems noted below. At baseline, a month or so ago, the patient had only mild renal insufficiency. Within the last 2 weeks, her creatinine and BUN had increased. Of note, her diuretic regimen has been altered, and now she is taking torsemide 40 mg p.o. B.i.d., and much of the time taking Zaroxolyn 5 mg every other day. She is doing this on a p.r.n. Basis when she is gaining weight.  Currently, she states she is having shortness of breath at rest. She does not feel like the manipulation of her diuretics has made any difference in regards to her shortness of breath. She does note that oxygen makes a significant difference and helps her a great deal. She is using oxygen at baseline and at all times now.  She is not eating or drinking that well, and she states she does not feel that well. She does not feel that she feels really any better than she did posthospitalization.    Chemistry      Component Value Date/Time   NA 137 08/15/2010 0907   K 4.4 08/15/2010 0907   CL 93* 08/15/2010 0907   CO2 34* 08/15/2010 0907   BUN 51* 08/15/2010 0907   CREATININE 2.3* 08/15/2010 0907      Component Value Date/Time   CALCIUM 8.9 08/15/2010 0907   ALKPHOS 133* 07/30/2010 1801   AST 20 07/30/2010 1801   ALT 11 07/30/2010 1801   BILITOT 0.8 07/30/2010 1801      Review of Systems Significant for no chest pain. Shortness of breath at baseline. Edema at baseline. Decreased  appetite. Now walking with a walker, previously walking with a cane approximately one month ago. Generally does not feel well. No fever or chills.    Objective:   Physical Exam    GEN: WDWN, NAD, Non-toxic, A & O x 3 HEENT: Atraumatic, Normocephalic. Neck supple. No masses, No LAD. Ears and Nose: No external deformity. CV: 3/6 SEM. Irregularly irregular PULM: CTA B, no wheezes, crackles, rhonchi. No retractions. No resp. distress. Decreased BS. EXTR: 1+ LE edema NEURO Normal gait.  PSYCH: Normally interactive. Conversant. Not depressed or anxious appearing.  Calm demeanor.      Assessment & Plan:

## 2010-08-16 NOTE — Discharge Summary (Signed)
Donna Reese, Donna Reese               ACCOUNT NO.:  [id number]  MEDICAL RECORD NO.:  [id number]           PATIENT TYPE:  I  LOCATION:  4742                         FACILITY:  [facility]  PHYSICIAN:  Veverly Fells. Excell Seltzer, MD  DATE OF BIRTH:  10-Mar-1932  DATE OF ADMISSION:  07/30/2010 DATE OF DISCHARGE:  08/01/2010                              DISCHARGE SUMMARY   PRIMARY CARDIOLOGIST:  Veverly Fells. Excell Seltzer, MD  PRIMARY CARE PROVIDER:  Juleen China, MD at [facility] primary care.  DISCHARGE DIAGNOSES: 1. Acute on chronic diastolic congestive heart failure, improved. 2. Chronic atrial fibrillation, not a Coumadin candidate secondary to     history of recurrent GI bleed. 3. History of recurrent deep venous thrombosis status post IVC filter     in 2009. 4. Peripheral artery disease. 5. Coronary artery disease status post coronary artery bypass grafting     in 2003.     a.     Left internal mammary artery to left anterior descending,      left radial to obtuse marginal.  Last catheterization in 2009      demonstrating patent grafts with preserved left ventricular      function.  SECONDARY DIAGNOSIS: 1. Renovascular hypertension. 2. History of abdominal aortic aneurysm status post repair in 1997. 3. Chronic kidney disease, stage III.  History of contrast induced     nephropathy in September 2011. 4. Chronic anemia. 5. Non-insulin-dependent diabetes mellitus. 6. Hyperlipidemia. 7. Chronic obstructive pulmonary disease, home O2 dependent. 8. History of thrombocytopenia. 9. Peptic ulcer disease.  ALLERGIES: 1. CODEINE causing hallucinations. 2. PENICILLIN causing swelling/hives. 3. SULFA causing shortness of breath. 4. MORPHINE SULFATE causing nausea. 5. DEMEROL. 6. NIACIN. 7. Contrast media causing kidney failure.  PROCEDURES/DIAGNOSTICS PERFORMED DURING HOSPITALIZATION:   1. Chest x-ray on Jul 31, 2010 demonstrating decrease in right pleural fluid  volume from July 26, 2010.  Mild  interstitial prominence without airspace edema.  REASON FOR HOSPITALIZATION:  This is a 77 year old female with a complex cardiovascular history, as stated above who was recently evaluated by Dr. Patsy Lager last week for progressive shortness of breath and generalized fatigue.  The patient's Lasix dose was increased as well as Zaroxolyn was added every other day.  The patient continued to feel poorly.  She noted no real improvement with the chest pain in her medications.  She continued to have edema in her lower extremities.  She does complain of chronic postural lightheadedness and denies syncope. She has chronic 2-pillow orthopnea.  With the patient's persistent symptoms of volume overload and dyspnea despite increase of her oral diuretic, Dr. Excell Seltzer felt admission for intravenous diuresis and close monitoring is appropriate.  HOSPITAL COURSE:  The patient was admitted to the telemetry unit.  She was started on IV diuresis.  The patient's initial BNP was noted to be 258.  With 24 hours of diuresis, the patient continued to show volume overload on exam.  She was continued on IV diuresis for the next 24 hours.  The patient's potassium was found to be 2.8, this was repleted and followed closely.  Her renal function remained stable with a  creatinine between 1.5 and 1.8.  The patient clinically improved with no complaints of chest pain or dyspnea after 48 hours on IV diuresis. Prior records, the patient only had mild diuresis with approximately 1 kg but with her very symptomatic improvement, it was felt the patient was stable for home.  Dr. Excell Seltzer felt that changing the patient's Lasix to torsemide would aid in her volume overload.  The patient was continued with use of Zaroxolyn only as needed for weight gain of 3 pounds or more.  Discharge instructions as needing to do dry weight at home and then weighing daily was discussed with the patient.  She voiced standing.  Again, the patient will  use Zaroxolyn if her weight is up 3 pounds 1 day.  She will also call our office for any questions or concerns.  At this time, we will discontinue Lasix.  The patient will be monitored closely in the outpatient setting.  DISCHARGE LABS:  Sodium 137, potassium 4.4, BUN 28, creatinine 1.78.  DISCHARGE MEDICATIONS: 1. Torsemide 20 mg 2 tablets twice daily. 2. Zaroxolyn 5 mg 1 tablet daily as needed for an increase in weight     of 3 pounds or more. 3. Acular 0.4% solution 1 drop in right eye 4 times a day. 4. Cozaar 100 mg 1 tablet daily. 5. Ferrous sulfate 325 mg, 1 tablet daily with breakfast. 6. Home oxygen 2 liters per minute. 7. Imdur 30 mg 1 tablet daily. 8. Kenalog 0.1% cream 1 application topically twice daily. 9. Klor-Con 10 mEq 1 tablet daily. 10.Dulera inhaler 100/5 one puff inhaled daily. 11.Nitroglycerin sublingual 0.4 mg 1 tablet under tongue every 5     minutes as needed up to 3 doses for chest pain. 12.Norvasc 10 mg 1 tablet daily. 13.Oxycodone 5 mg 1 tablet daily as needed. 14.Prilosec 40 mg 1 tablet daily. 15.Remeron 15 mg 1 tablet daily at bedtime. 16.Spiriva 19 mcg 1 capsule inhaled daily. 17.Toprol-XL 50 mg 1 tablet daily. 18.Vytorin 10/20 mg 1 tablet daily at bedtime. 19.Xalatan 0.005% ophthalmic 1 drop to both eyes daily. 20.Please stop taking Lasix.  FOLLOWUP PLANS/INSTRUCTIONS: 1. The patient will follow up with Tereso Newcomer, physician assistant     for Dr. Excell Seltzer on May 16 at 10:30 a.m.  At this time she will also     have a basic metabolic panel drawn. 2. The patient is to follow up with Dr. Patsy Lager as previously     scheduled. 3. The patient to increase activity as tolerated. 4. The patient is to continue low-sodium heart-healthy diet. 5. The patient is to record daily weights and take medication as     prescribed, she is to call our office for an increase to 3-5 pills     a day. 6. The patient is also to call the office in the interim for any      problems or concerns.  DURATION OF DISCHARGE:  Greater than 30 minutes with physician and physician extended time.     Leonette Monarch, PA-C   ______________________________ Veverly Fells. Excell Seltzer, MD    NB/MEDQ  D:  08/01/2010  T:  08/01/2010  Job:  [id number]  cc:   Veverly Fells. Excell Seltzer, MD Juleen China, MD  Electronically Signed by Alen Blew P.A. on 08/02/2010 03:06:34 PM Electronically Signed by Tonny Bollman MD on 08/16/2010 03:00:08 PM

## 2010-08-17 NOTE — Assessment & Plan Note (Signed)
[facility]                              CARDIOLOGY OFFICE NOTE   Donna, Reese                      MRN:          [id number]  DATE:02/25/2006                            DOB:          April 07, 1931    PRIMARY CARE PHYSICIAN:  Atha Starks. Bean, FNP.   CLINICAL COURSE:  Donna Reese has had previous bypass surgery and previous  abdominal aortic aneurysm repair and was admitted recently with a syncopal  episode. She underwent workup including cardiac catheterization which showed  all Donna Reese grafts were patent. She had some difficulty with access but this was  finally accomplished with the second procedure. She had carotid Dopplers  which showed no obstructed disease and no clear etiology was found for Donna Reese  syncope. She was discharged and was seen in followup by Tereso Newcomer and was  treated with antibiotics with Avelox for a bronchial infection.   Since that time she has done fairly well. She says she still has a  persistent cough productive of brown sputum.   PAST MEDICAL HISTORY:  Significant for peripheral vascular disease and  abdominal aortic aneurysm repair in 1997 and bilateral calf claudication.  She also has a history of paroxysmal atrial fibrillation, deep vein  thrombophlebitis and pulmonary embolism and is on Coumadin. She also has  hypertension.   CURRENT MEDICATIONS:  Tramadol, labetalol, gabapentin, calcium, Norvasc,  hematogen and Chromagen and Coumadin as directed.   PHYSICAL EXAMINATION:  VITAL SIGNS:  Blood pressure 140/54, pulse 63 and  regular.  NECK:  There was vein extension. The carotid pulses were full with bilateral  bruits.  CHEST:  Clear without rales or rhonchi.  CARDIAC:  Rhythm was regular. There were no murmurs or gallops.  ABDOMEN:  Soft with normal bowel sounds. There was a loud abdominal bruit.  EXTREMITIES:  There was trace peripheral edema. I had difficulty feeling  pedal pulses on either side.    IMPRESSION:  1. Recent syncopal episode with no cause found.  2. Coronary artery disease status post coronary bypass graft surgery in      2003 with recent patent grafts and normal left ventricular function.  3. Status post abdominal aortic aneurysm repair in 1997 with peripheral      vascular disease and bilateral claudication.  4. History of paroxysmal atrial fibrillation.  5. History of deep vein thrombophlebitis and pulmonary embolism with      Coumadin treatment.  6. Hypertension.  7. Hyperlipidemia.   RECOMMENDATIONS:  Mr. Dangerfield has had symptomatic claudication and has not  had Doppler studies in some time and we will plan to obtain those. I will  give Donna Reese another course of antibiotics and give Donna Reese a course of Z-Pak for  Donna Reese bronchial infection. We will get a chest x-ray today to make sure there  is no pneumonia or other serious problem. I will plan to see Donna Reese back in 6  months.     Bruce Elvera Lennox Juanda Chance, MD, [facility]  Electronically Signed    BRB/MedQ  DD: 02/25/2006  DT: 02/25/2006  Job #: [id number]

## 2010-08-17 NOTE — H&P (Signed)
[place]  Patient:    Donna Reese, Donna Reese Visit Number: [id number] MRN: [id number]          Service Type: MED Location: 2000 2006 01 Attending Physician:  Donna Reese Dictated by:   Donna Reese, M.D. [facility] Admit Date:  08/30/2001                           History and Physical  PRIMARY CARDIOLOGIST:  Dr. Everardo Beals. Brodie.  CHIEF COMPLAINT:  [age]-year-old woman with peripheral vascular disease and previous AAA resection and graft now presents with ischemic chest discomfort.  HISTORY OF PRESENT ILLNESS:  The patient has been followed by Dr. Juanda Chance due to a history of peripheral vascular disease for management of cardiovascular risk factors and observation for development of possible coronary artery disease.  She had a stress Cardiolite study approximately one year ago that was apparently negative.  She has had some intermittent chest discomfort, but this has been relatively mild and has not prompted emergency department assessment or hospital admission.  In the past, she noticed a rawness in her chest with exertion.  When respirations returned to normal, the symptoms faded.  Her current episode began at rest.  She experienced substernal chest burning with radiation to the left arm and hand.  There was associated dyspnea, diaphoresis and nausea.  The pain faded over a number of minutes, but returned on a number of occasions.  She continued to have discomfort in the emergency department that resolved after nitroglycerin was administered.  Her vascular disease has included abdominal aortic aneurysm, repaired by Dr. Quita Skye. Lawson in 1997.  She has known carotid artery disease, most prominent on the right, that has not required surgical intervention.  She has hypertension that has been well-controlled.  CURRENT MEDICATIONS: 1. Cozaar 50 mg q.d. 2. Hydrochlorothiazide 25 mg q.d. 3. Labetalol 200 mg b.i.d. 4. Kay Ciel 20 mEq  q.d. 5. Warfarin 5 mg alternating with 2.5 mg  PAST MEDICAL HISTORY:  Notable for prior DVT and pulmonary embolism for which she is chronically anticoagulated.  She has had peptic ulcer disease.  She has had a number of surgeries including cholecystectomy, appendectomy and hysterectomy.  SOCIAL HISTORY:  Lives in [place]; retired Education officer, environmental.  Forty-pack-year history of cigarette smoking, discontinued 10 years ago.  FAMILY HISTORY:  Has a brother who has undergone CABG and another brother with myocardial infarction.  REVIEW OF SYSTEMS:  Notable for intermittent night sweats, chronic orthopnea, heartburn; all other systems negative.  PHYSICAL EXAMINATION:  GENERAL:  Overweight pleasant woman in no acute distress.  VITAL SIGNS:  Afebrile.  Heart rate 80 and regular.  Respirations 18.  Blood pressure 150/65.  HEENT:  Anicteric sclerae.  NECK:  No jugular venous distention; harsh bilateral carotid bruits with normal carotid upstrokes.  ENDOCRINE:  No thyromegaly.  SKIN:  No significant lesions.  HEMATOPOIETIC:  No adenopathy.  LUNGS:  Clear.  CARDIAC:  Normal first and second heart sounds; modest early systolic ejection murmur; fourth heart sound present.  ABDOMEN:  Soft and nontender; midline scar; no bruits appreciated; aortic pulsation not palpable.  EXTREMITIES:  Distal pulses intact; no edema.  NEUROMUSCULAR:  Symmetric strength and tone.  LABORATORY AND ACCESSORY DATA:  EKG:  Normal sinus rhythm; minor nonspecific ST segment abnormality.  Chest x-ray:  Cardiomegaly; otherwise, clear.   Laboratories notable for initial troponin of 0.09; normal CPK and CPK-MB.  INR is 3.5.  IMPRESSION:  1. Chest pain with ischemic quality, somewhat abnormal electrocardiogram and    borderline troponin.  I am concerned that this represents myocardial    ischemia.  Initial treatment will be with intravenous nitroglycerin and    low-molecular-weight heparin.  We will plan  for coronary arteriography on    Tuesday after INR has returned towards normal. 2. Hypertension -- adequately controlled. 3. Peripheral vascular disease -- will seek Dr. Rodell Perna records.  She may    need a followup carotid ultrasound study. 4. Cardiovascular risk factors -- will obtain lipid profile and adjust    medications accordingly. Dictated by:   Donna Reese, M.D. [facility] Attending Physician:  Donna Reese DD:  08/30/01 TD:  08/31/01 Job: [id number]

## 2010-08-17 NOTE — Op Note (Signed)
NAMEKEARSTYN, Donna Reese NO.:  [id number]   MEDICAL RECORD NO.:  [id number]          PATIENT TYPE:  OIB   LOCATION:  2899                         FACILITY:  [facility]   PHYSICIAN:  Quita Skye. Hart Rochester, M.D.  DATE OF BIRTH:  [birth year]   DATE OF PROCEDURE:  06/28/2004  DATE OF DISCHARGE:                                 OPERATIVE REPORT   PREOPERATIVE DIAGNOSIS:  Bilateral hip and buttock claudication and right  calf claudication status post aortobifemoral bypass grafting.   PROCEDURE.:  Abdominal aortogram with bilateral lower extremity runoff via  left common femoral approach.   SURGEON:  Dr. Hart Rochester.   ANESTHESIA:  Local Xylocaine.   CONTRAST:  188 cc.   ESTIMATED BLOOD LOSS:  40 cc.   COMPLICATIONS:  None.   DESCRIPTION OF PROCEDURE:  The patient was taken to the [facility]  peripheral endovascular lab, placed in the supine position at which time  both groins were prepped with Betadine scrub solution and draped in a  routine sterile manner. After infiltration of 1# Xylocaine, the right common  femoral artery was entered percutaneously. Attempt was made to pass the  guidewire up the right limb of aortobifemoral graft, but it continued to go  up the native femoral artery after three attempts, then went to the left  side where the left femoral artery was easily cannulated and the guidewire  easily went up the left limb of the graft. After insertion of the 5-French  sheath over the guidewire removing the dilator, standard pigtail catheter  was positioned in the suprarenal aorta and a flush abdominal aortogram  performed, injecting 20 cc of contrast at 20 cc per second. Aorta was widely  patent with single renal arteries bilaterally. There was approximate 70%  stenosis near the origin of the left renal artery and widely patent right  renal artery. The aortic graft was anastomosed to the infrarenal aorta about  3-4 cm distal to the renal arteries. There were several  areas of mild  irregularity in the aortic graft, particularly on the left limb. Proximally,  there was a tubular area with a 40-50% stenosis, and there were two focal  areas in the midportion of the left limb, one approximating 75% in severity.  The limbs filled the native iliac systems which filled retrograde up to the  ligated aortic stump. The catheter was withdrawn into the terminal portion  of the graft. Bilateral lower extremity runoff performed injecting 88 cc of  contrast at 8 cc per second. In addition to this, LAO and RAO projections  were obtained of the limbs of the graft, focusing on the common femoral  anastomoses bilaterally, and lateral view was obtained of the left side  through the sheath at the termination of the procedure.   Following all of these views, the following findings were made:  On the  right side, the right limb of the graft was widely patent down to the distal  6 cm which was mildly irregular, and there was an approximate 70% stenosis  over the distal 1-2 cm of the right limb of the graft.  The superficial  femoral artery on the right was widely patent proximally, but there was a  focal mid superficial femoral artery stenosis over about 1-2 cm in length  causing approximate 80% narrowing. The remainder the SFA was smooth.  Popliteal artery was widely patent, and there was two-vessel runoff on the  right through the posterior tibial and peroneal arteries. On the left side  as noted, there were multiple areas of mild to moderate narrowing in the  left limb of the graft, and on the distal portion of the graft over the  distal 2-3 cm just above the femoral artery anastomosis, there was a focal  60-70% stenosis as well as a focal stenosis at the origin of the profunda  approximating 70%. Left superficial femoral artery and popliteal arteries  were widely patent and two-vessel runoff on the left via the anterior tibial  and posterior tibial arteries. Having  tolerated procedure well, sheath was  removed, adequate compression applied. No complications ensued.   FINDINGS:  1.  Widely patent aortobifemoral graft proximally with multiple areas of      mild to moderate narrowing of the left limb of the graft as described.  2.  Focal areas of narrowing just proximal to bilateral common femoral      anastomoses approximating 70% in the loop distal limbs of the graft.  3.  Focal right superficial femoral artery stenosis approximating 80% in the      mid thigh.  4.  Two-vessel runoff bilaterally through posterior tibial and anterior      tibial arteries on the left and posterior tibial and peroneal arteries      on the right.      JDL/MEDQ  D:  06/28/2004  T:  06/28/2004  Job:  [id number]

## 2010-08-17 NOTE — Cardiovascular Report (Signed)
NAMEFELICIE, KOCHER NO.:  [id number]   MEDICAL RECORD NO.:  [id number]          PATIENT TYPE:  INP   LOCATION:  3703                         FACILITY:  [facility]   PHYSICIAN:  Veverly Fells. Excell Seltzer, MD  DATE OF BIRTH:  11/15/1931   DATE OF PROCEDURE:  DATE OF DISCHARGE:                              CARDIAC CATHETERIZATION   PROCEDURE:  1. Attempted left heart cath.  2. Distal aortic angiogram.  3. Right iliac angiogram.   INDICATION:  Ms. Grandpre is a very nice 77 year old woman who presented with  chest pain and syncope after walking up 5 flights of stairs.  She has a  history of coronary artery disease and prior bypass surgery and was referred  for cardiac catheterization in the setting of her chest pain.   PROCEDURAL DETAILS:  Risks and indications of the procedure were explained  to the patient.  Informed consent was obtained.  The right groin was  prepped, draped, and anesthetized with 1% lidocaine using normal sterile  conditions.  Using modified Seldinger technique, a 6 French arterial sheath  was placed in the right common femoral artery.  After arterial access was  obtained, I was unable to pass the .035 guidewire beyond the distal aorta.  At that point, we checked a left femoral artery pressure, which was  approximately 100 mmHg systolic.  The patient's noninvasive left arm  pressure was 180 mmHg.  An angiogram was performed in the distal aorta  through a JR4 catheter and this demonstrated total aortic occlusion.  There  was also proximal left common iliac occlusion.  The right common iliac  artery was diffusely diseased throughout with high-grade stenosis.  There  was a large hypogastric vessel that appeared to supply collateral flow and  diffusely diseased external iliac artery.   After reviewing potential access sites for the patient who has a left  internal mammary artery graft, I decided to abort the procedure.  Potential  access sites include the  left brachial artery.  I did not want to do  brachial access with the patient's INR of 1.8 out of concern for the risk of  a brachial hematoma.  She has a left free radial graft, so left radial  access was not an option.  Her right arm is not useful since the patient has  a left internal mammogram graft and we were unable to access the left  subclavian through the right arm.  The left groin would be a possibility to  access through her aortobifem graft.  However, I did not think the risk of  repeat attempts at arterial access were worthwhile in a patient with an  elevated INR.   Recommend an adenosine Myoview stress test tomorrow.  If the adenosine  Myoview shows high-risk findings, we will plan on attempting catheterization  on Monday through the right brachial artery or left common femoral bypass  graft.      Veverly Fells. Excell Seltzer, MD  Electronically Signed     MDC/MEDQ  D:  01/17/2006  T:  01/18/2006  Job:  [id number]

## 2010-08-17 NOTE — Assessment & Plan Note (Signed)
[facility]                           GASTROENTEROLOGY OFFICE NOTE   LORALI, KHAMIS                      MRN:          [id number]  DATE:02/12/2006                            DOB:          1931/05/29    Donna Reese comes in and says she was seen in the emergency room at [facility] for and admitted for 10 days.  She had been having some solid and  liquid dysphagia.  She said food was simply getting stuck.  Liquids  especially with water were sometimes very bothersome.  She states she has  trouble with pills.  She had some nausea, and some chest pain, some  abdominal pain especially in the periumbilical area.  She has had some  constipation every 2-3 days even with a stool softener.  She has lost  approximately 55 pounds since May 2007, although she has been trying to  loose weight.  She did have a colonoscopic examination and upper endoscopy  in 1990.  She has a niece who has Crohn disease.   PAST MEDICAL HISTORY:  1. She has hypertension.  2. She is status post myocardial infarction.  3. History of asthma.  4. Diabetes.  5. Hyperlipidemia.  6. Stroke.  7. Arthritis.  8. Anxiety.  9. Depression.  10.Blood clotting disorder or coagulopathy.  11.She is status post cholecystectomy.  12.Cardiac bypass.  13.Hysterectomy.  14.Breast surgery.  15.Appendectomy.  16.Heart valve surgery.  17.Type 2 diabetes.   SOCIAL HISTORY:  Really not contributory.   REVIEW OF SYSTEMS:  Noncontributory.   PHYSICAL EXAMINATION:  VITAL SIGNS:  This patient is 5 feet 5 inches, weighs  200 pounds.  Blood pressure 142/42, pulse 64 and regular.  Neck, heart and  extremities are all unremarkable.   IMPRESSION:  1. Anemia.  2. Gastroesophageal reflux disease.  3. Arteriosclerotic cardiovascular disease.  4. History of asthma.  5. Status post history of peptic ulcer disease.  6. History of deep vein thrombosis.  7. Type 2 diabetes.  8. Status post  cholecystectomy.  9. History of constipation.  10.Weight loss - intentional.   RECOMMENDATIONS:  1. It is discussed with her for colon screens.  2. Give the patient iron, check b.i.d.  3. Prevacid 30 mg q.a.m., give her samples and a prescription for both.  4. Schedule the patient for upper endoscopy and a colposcopic examination      as well.     Ulyess Mort, MD  Electronically Signed    SML/MedQ  DD: 02/12/2006  DT: 02/12/2006  Job #: [id number]

## 2010-08-17 NOTE — Assessment & Plan Note (Signed)
Not a Coumadin candidate. Multiple GI bleeds

## 2010-08-17 NOTE — Letter (Signed)
February 05, 2006    Donna Mort, MD  [address]  [place], [place] [address]   RE:  Donna Reese, Donna Reese  MRN:  [id number]  /  DOB:  03/06/1932   Dear Dr. Doreatha Martin:   This is to reintroduce Donna Reese, a 77 year old white female, generally  seen in the [facility] office of [place] who has an appointment with you  on February 02, 2006.   She was recently hospitalized October 17 to January 24, 2006 at [facility] due to  chest pain and syncope with the syncopal attack occurring on January 12, 2006 when she was on a vacation in [place]. On her return here she was  found to have an irregular pulse with elevation in her blood pressure and  was elevated in the [facility] ER and admitted. She ultimately had a left heart  catheter that revealed severe two vessel coronary artery disease with left  main stenosis. She has widely patent grafts from previous surgery and normal  left ventricular function with an ejection fraction of 67%.   Interestingly she was also found to be anemic with hemoglobin in the 9.2 to  9.9 range. Hematocrit 28 to 30.9, rbcs 3.71 to 4.04. Her iron was found to  be 25 and her percent saturation 7. She was seen in this office on February 04, 2006 when she told me that cardiology had wanted her to be referred to GI  for evaluation due to her anemia. I obtained a folate which was 7.7 and B12  355 on February 04, 2006.   I have started her on Chromagen one a day for a week at bedtime and then to  increase to one at breakfast and one at bedtime if she tolerates this. Iron  has been a problem in the past. She also does not eat an iron rich diet.  However, these things have been reviewed with her.   If I can be of further assistance, please do not hesitate to call. She has a  past history of coronary artery disease with bypass graft x2 September 04, 2001,  peripheral vascular disease with bilateral lower extremity claudication  status post runoff June 28, 2004, history of abdominal  aortic aneurysm  repaired in 1997, history of DVT, and PE on chronic anticoagulation. She has  a history of peptic ulcer disease, hypertension, hyperlipidemia. Is status  post cholecystectomy, appendectomy, and hysterectomy. She has bilateral  carotid bruits. She also has intermittent right-sided weakness. Currently  her medication list includes Klor-Con 20 mEq daily, labetalol 300 mg one  b.i.d., Coumadin 2 mg daily, hydrochlorothiazide 25 mg daily, Xalatan 2.5 mg  daily, Lipitor 20 daily, Zoloft 20 mg daily, Cozaar 100 mg daily, gabapentin  100 mg six tablets daily, calcium and vitamin D twice a day, fish oil 1000  mg two a day, aspirin 81 mg daily, metformin 500 mg one b.i.d., and  amlodipine 5 mg daily.   ALLERGIES:  PENICILLIN, CODEINE, DEMEROL, MORPHINE, DARVON, SULFUR, and  NIASPAN.    Sincerely,       Donna D. Bean, FNP  Electronically Signed      Arta Silence, MD  Electronically Signed   BDB/MedQ  DD: 02/05/2006  DT: 02/05/2006  Job #: [id number]

## 2010-08-17 NOTE — Consult Note (Signed)
Donna Reese, Donna Reese NO.:  [id number]   MEDICAL RECORD NO.:  [id number]          PATIENT TYPE:  INP   LOCATION:  3703                         FACILITY:  [facility]   PHYSICIAN:  Rollene Rotunda, MD, FACCDATE OF BIRTH:  Feb 20, 1932   DATE OF CONSULTATION:  01/20/2006  DATE OF DISCHARGE:                                   CONSULTATION   PRIMARY CARE PHYSICIAN:  Billie D. Bean, FNP.   CARDIOLOGISTEverardo Beals Juanda Chance, MD, [facility]   PROCEDURE:  Left heart catheterization and coronary arteriography   INDICATIONS:  Evaluate patient with chest pain suggestive of unstable  angina.  Previous CABG.   PROCEDURE:  Left heart catheterization performed via the left femoral  artery.  The artery was cannulated using a high anterior puncture given her  previous lower extremity revascularization.  A #6-French arterial sheath was  inserted via the modified Seldinger technique.  Preformed Judkins and  pigtail catheter were utilized.  All catheter exchanges were over a  guidewire.   RESULTS:   HEMODYNAMICS:  LV 192/14, AO 194/98.   CORONARIES:  Left main was not selectively cannulated.  The vessels are  visualized via flesh injection.  She had a high-grade left main disease said  to be 80% on her previous catheterization.   The LAD and proximal 80% stenosis.  There was moderate diffuse disease  before the insertion of a LIMA graft.  A vessel wrapped the apex.   Circumflex in the AV groove was a small vessel giving off an obtuse marginal  which was free of high-grade disease.  There was a large ramus intermediate  which was bypassed.  He had a long proximal 60% stenosis.   The right coronary artery was a dominant vessel.  There were diffuse luminal  irregularities.  There was a long mid 30% stenosis.  There was a large RV  with branch which was normal.  There is a PDA which was large and normal.  There were too small posterolaterals which also normal.   GRAFTS:  A free RIMA to the  obtuse marginal was widely patent.   LIMA to the LAD was widely patent.   LEFT VENTRICULOGRAM:  The left ventriculogram was obtained in the RAO  projection.  The EF was 65% with no significant wall motion abnormalities,  although it was somewhat difficult to estimate because of ventricular  ectopy.   CONCLUSION:  Severe two-vessel coronary artery disease with left main  stenosis.  She has widely patent grafts.  She has normal left ventricle  function.   PLAN:  Based on the above, there is no indication for further  revascularization.  The patient will continue with risk reduction.           ______________________________  Rollene Rotunda, MD, [facility]     JH/MEDQ  D:  01/20/2006  T:  01/21/2006  Job:  [id number]   cc:   Everardo Beals. Juanda Chance, MD, [facility]  [name] D. Bean, FNP

## 2010-08-17 NOTE — Op Note (Signed)
[place]. [facility]  Patient:    Donna Reese, Donna Reese Visit Number: [id number] MRN: [id number]          Service Type: MED Location: 2000 2041 01 Attending Physician:  Waldo Laine Dictated by:   Gwenith Daily Tyrone Sage, M.D. Proc. Date: 09/04/01 Admit Date:  08/30/2001   CC:         Bruce R. Juanda Chance, M.D. [facility]   Operative Report  PREOPERATIVE DIAGNOSIS: Coronary occlusive disease with unstable angina, left main disease.  POSTOPERATIVE DIAGNOSIS: Coronary occlusive disease with unstable angina, left main disease.  PROCEDURES PERFORMED: Coronary artery bypass grafting x 2 with the left internal mammary artery to the left anterior descending coronary artery and left radial artery to the circumflex coronary artery.  SURGEON: Gwenith Daily. Tyrone Sage, M.D.  ASSISTANT SURGEON: First assistant Maxwell Marion, RNFA  BRIEF HISTORY: The patient is a 77 year old female who presented with unstable anginal symptoms. She was stabilized medically and underwent cardiac catheterization. The catheterization revealed luminal irregularities of the right coronary artery, but with distal left main disease of greater than 80% and disease extending into the left anterior descending coronary artery. Because of the patients symptoms, coronary artery bypass graft was recommended. She previously had a history of pulmonary emboli, and had been on Coumadin. The risks and options were discussed with the patient, who agreed and signed informed consent. The use of the radial artery was also discussed with her.  DESCRIPTION OF THE PROCEDURE:  With Swan-Ganz and arterial line monitors in place, the patient underwent general endotracheal anesthesia without incident. The skin of the chest and legs was prepped with Betadine and draped in the usual sterile manner. The left arm was also draped out. The hand was again checked with a pulse oximeter and had good flow, even with radial  compression.  An incision was made over the radial artery and dissection was carried out carefully the length of the radial artery, carefully clipping each of the side branches and preserving the associated veins. The radial artery was of adequate size and the caliber of the vessel was hydrostatically dilated with heparinized saline and papaverine solution. It was removed and the arm was closed with a running 2-0 Vicryl and skin staples.  A median sternotomy was performed. The left internal mammary artery was dissected down to the pedicle graft. The distal artery was divided. It had good free flow. The pericardium was opened. The overall ventricular function appeared perserved. Consideration for off-pump bypass was abandoned because of the patients obesity and large heart, making it difficult for visualization. In addition the left anterior descending coronary artery was intramyocardial.  The patient was systemically heparinized. The ascending aorta and the right atrium were cannulated in the aortic root. The cardioplegia needle was introduced into the ascending aorta. The patient was placed on cardiopulmonary bypass 2.4 liters/minute per sq m. The sites of the anastomosis were selected and dissected to the epicardium. The left anterior descending coronary artery was identified deep in the myocardial position but was suitable for bypass.  The patients body temperature was cooled to 38 degrees. The aortic cross clamp was applied. Then 500 cc of cold blood test cardioplegia was administered with rapid diastolic arrest. The heart and myocardial surface temperature were monitored throughout the cross clamp.  Attention was turned first to the circumflex coronary artery which was opened and using a running 8-0 Prolene, the radial artery was anastomosed to the circumflex coronary artery. Attention was then turned to the left anterior descending  coronary artery, which was opened in the distal  position.  Using a running 8-0 Prolene, the left internal mammary artery was anastomosed to the left anterior descending coronary artery. With release of the bulldog on the mammary artery, there was appropriate rise in the myocardial central temperature.  The aortic cross clamp was removed. Total cross clamp time was 56 minutes. The patient spontaneously converted to a sinus rhythm. A partial occlusion clamp was placed on the ascending aorta. A single punch aortotomy was performed and the radial artery was anastomosed to the ascending aorta. Air was evacuated from the graft. The partial occlusion clamp was removed. The sites of anastomosis were inspected and free of bleeding.  The patient was then ventilated and was weaned from cardiopulmonary bypass. She remained hemodynamically stable. She was decannulated in the usual fashion. Protamine sulfate was administered with the operative field hemostatic. Two atrial and two ventricular pacing wires were applied, graft markers were applied. A left pleural tube and two mediastinal tubes were left in place. The sternum was closed with a #6 stainless steel wire. The fascia was closed with interrupted 0 Vicryl and running 3-0 Vicryl. The subcutaneous tissue with 4-0 silk to the skin edges. Dry dressings were applied.  The sponge and needle counts were reported as correct at the conclusion of the procedure. The patient tolerated the procedure without any obvious complications. She was transferred to the surgical intensive care unit for further postoperative care. Dictated by:   Gwenith Daily Tyrone Sage, M.D. Attending Physician:  Waldo Laine DD:  09/08/01 TD:  09/09/01 Job: [id number]

## 2010-08-17 NOTE — Cardiovascular Report (Signed)
[place]. [facility]  Patient:    Donna Reese, Donna Reese Visit Number: [id number] MRN: [id number]          Service Type: MED Location: 2300 2306 01 Attending Physician:  Waldo Laine Dictated by:   Everardo Beals Juanda Chance, M.D. [facility] Proc. Date: 09/03/01 Admit Date:  08/30/2001   CC:         Juluis Mire, M.D.  Cardiopulmonary   Cardiac Catheterization  CLINICAL HISTORY:  The patient is 77 years old and was admitted with chest pain and slightly elevated troponins consistent with a non-Q-wave infarction. She has had a previous abdominal aortic aneurysm repair and she has been on Coumadin for a remote DVT and pulmonary embolism.  She also has hypertension.  DESCRIPTION OF PROCEDURE:  The procedure was performed with a right femoral artery arterial sheath and 6.3 ______ coronary catheters.  A femoral arterial ______ was performed and Omnipaque contrast was used.  There was damping of the left main catheter, so we tried to use a 3.5 x 6 [place] guiding catheter with side holes, but this did not fit.  We finally used a 6 French 3.0 guiding catheter without side holes.  There was some intermittent damping into the left main.  A distal aortogram was performed to evaluate her previous abdominal aneurysm repair.  She tolerated the procedure well and left the laboratory in satisfactory condition.  RESULTS: 1. Left main coronary artery:  Left main coronary artery had a 90% ostial    stenosis. 2. Left anterior descending artery:  Left anterior descending artery had a 90%    stenosis at its ostium which appeared to be a ruptured plaque.  The LAD    gave rise to a diagonal branch and a septal perforator.  These and the LAD    proper were free of significant disease. 3. Circumflex artery:  Circumflex artery gave rise to an intermediate branch    and AV branch.  These were also free of significant disease. 4. Right coronary artery:  Right coronary artery is a large  dominant vessel    that gave rise to a conus branch, right ventricular branch, posterior    descending branch, and four posterior lateral branches.  There was 30%    narrowing in the proximal right coronary artery and irregularities in the    mid vessel, but there was no other major obstruction. 5. Left ventriculogram:  Left ventriculogram performed in the RAO projection    showed good wall motion with no evidence of hypokinesis.  The estimated    ejection fraction was 60%. 6. Distal aortogram:  Distal aortogram was performed which showed patent renal    arteries.  The aortic graft appeared intact.  There was no major aortoiliac    obstruction. 7. Pressures:  The aortic pressure was 175/25, mean of 74.  Left ventricular    pressure was 175/65, mean of 105.  CONCLUSION:  Coronary artery disease with 90% stenosis of the ostium of the left main coronary artery, 90% ostial stenosis of the left anterior descending artery, no major obstruction of the circumflex artery, 30% narrowing of the proximal right coronary artery, and normal LV function.  RECOMMENDATIONS:  We have consulted Dr. Cornelius Moras with [facility] and will arrange for the patient to have surgery in the next 24 hours. Dictated by:   Everardo Beals Juanda Chance, M.D. [facility] Attending Physician:  Waldo Laine DD:  09/03/01 TD:  09/07/01 Job: [id number]

## 2010-08-17 NOTE — Assessment & Plan Note (Addendum)
Suspect that COPD is playing a significant role in her SOB.  Home Health documentation: Face to face encounter documentation follows: Patient needs home health services for the following reasons:  Home health services needed: 1. Oxygen documented above. With walking, patient quickly drops to 78% o2 off of oxygen. Recommend oxygen at all times indefinitely

## 2010-08-17 NOTE — Assessment & Plan Note (Signed)
[facility]                              CARDIOLOGY OFFICE NOTE   RICKI, VANHANDEL                      MRN:          [id number]  DATE:02/06/2006                            DOB:          1931-08-09    Cardiologist is Bruce R. Juanda Chance, MD.  Primary care Naomee Nowland is Atha Starks.  Bean, FNP, at [facility].   HISTORY OF PRESENT ILLNESS:  Ms. Posch is a very pleasant 77 year old  female patient with a history of coronary disease, status post CABG x2 two  in 2003, who presented to the hospital on October 17 with syncope in the  setting of angina.  She was set up for cardiac catheterization.  The patient  has peripheral arterial disease with history of abdominal aortic aneurysm  repair in 1997.  Dr. Excell Seltzer attempted cardiac catheterization on October 19.  This was not successful after repeated attempts to gain access through the  right femoral artery.  Therefore, the catheterization was cancelled and she  was set up for a Myoview study. This came back positive for mild anterior  ischemia with an EF of 67%.  She was therefore brought back to the  catheterization lab by Dr. Antoine Poche on October 22.  He went from the left  femoral artery.  This revealed severe two-vessel CAD with left main stenosis  of 80%.  Her free RIMA to the obtuse marginal and her LIMA to the LAD were  both patent.  Continued risk reduction was recommended.  During that  hospitalization, the patient also had an echocardiogram.  The EF was 65%.  There was mild to moderate mitral annular calcification and the left atrium  was dilated.  Her peak pulmonary artery systolic pressures were moderately  to markedly increased. She had carotid Dopplers performed as well.  This  showed mild to moderate mixed plaque with 40-60% ICA stenosis at the highest  end of the range.  In comparison to a carotid Doppler she had done in the  office in December 2005, this is actually stable.  At that time she  had 60-  79% on the right and 40-59% on the left. She also had an ultrasound done of  her right femoral artery and there was no evidence of pseudoaneurysm or AV  fistula.  She returns to the office today for post-hospitalization follow-  up.   The patient notes that she has had a continuous cough since she left the  hospital.  It has been productive of green sputum.  There has been no  hemoptysis.  She denies fevers or chills.  She does note more shortness of  breath than usual with exertion.  She has some right lower rib pain,  especially when she coughs.  She has had some anterior chest pain as well.  She actually struck her anterior chest when she had syncope prior to her  admission. She still has a bruise there. She notes that she had some  discomfort in her chest this morning and it was relieved by Rolaids.  She  denies any exertional chest discomfort.  She sleeps on  two pillows.  She has  actually done this recently since she has been home.  She denies any true  paroxysmal nocturnal dyspnea.  She denies any further syncope.   She recently saw Everrett Coombe, her primary care [facility] Vigorito.  She was noted to  be anemic and was placed on iron and was referred to Gastroenterology.  She  sees Dr. Victorino Dike next week.  Of note, her hemoglobin was 9.5 on October  26 and her MCV was 76.2.   CURRENT MEDICATIONS:  1. Lipitor 20 mg daily.  2. Zoloft 50 mg.  3. Hydrochlorothiazide 25 mg daily.  4. Potassium 20 mEq daily.  5. Coumadin as directed.  6. Xalatan eye drops.  7. Cozaar 100 mg daily.  8. Lidoderm patch.  9. Fish oil.  10.Metformin 500 mg b.i.d.  11.Tramadol p.r.n.  12.Labetalol 300 mg b.i.d.  13.Gabapentin 100 mg two tablets 3 times a day.  14.Calcium with vitamin D.  15.Norvasc 5 mg daily.  16.Chromagen b.i.d.  17.Aspirin 81 mg daily.   ALLERGIES:  PENICILLIN, SULFA, MORPHINE and CODEINE.   SOCIAL HISTORY:  The patient denies any current tobacco abuse.  She has a  40+  pack-year history.   REVIEW OF SYSTEMS:  Please see HPI.  Denies any melena, hematochezia,  hematuria or dysuria.  The rest of the review of systems is negative.   PHYSICAL EXAMINATION:  GENERAL:  She is well-nourished, well-developed  female, in no acute distress.  VITAL SIGNS:  Blood pressure 150/52. Pulse 62.  Weight 202 pounds.  Oxygen  saturation 90% on room air. Temperature is 98.3.  HEENT:  Head normocephalic, atraumatic.  Eyes: PERRLA.  Sclerae are clear.  TMs are clear bilaterally. Oropharynx pink without exudate.  NECK:  Without JVD.  LYMPHATIC:  Without lymphadenopathy.  ENDOCRINE:  No thyromegaly.  CARDIAC:  Normal S1, S2.  Regular rate and rhythm.  LUNGS:  Left base crackles, otherwise clear.  ABDOMEN:  Soft, nontender.  EXTREMITIES:  Without edema.  Calves were soft and nontender.  Bilateral  femoral arteries sites were soft, without hematomas.  There are positive  bruits bilaterally.  NEUROLOGIC:  She is alert and oriented x3.  Cranial nerves II-XII are  grossly intact.   Electrocardiogram reveals sinus rhythm with a heart rate of 62, normal axis,  Q waves V1 and V2.  No significant change since previous tracings.   IMPRESSION:  1. Recent admission for unexplained syncope.      a.     Patent grafts at catheterization and normal left ventricular       function by echocardiogram.      b.     Carotid stenosis stable by recent carotid Dopplers.  2. Coronary artery disease, status post coronary artery bypass graft in      June 2003 with patent grafts as noted above.  3. Productive cough, cannot rule out chronic obstructive pulmonary disease      exacerbation.  4. Peripheral arterial disease with bilateral lower extremity      claudication.  5. Status post abdominal aortic aneurysm repair in 1997.  6. History of deep vein thrombosis and pulmonary embolism, chronic      Coumadin anticoagulation.  7. (8) History of paroxysmal atrial fibrillation. 8. Peptic ulcer  disease.  9. Hypertension.  10.Hyperlipidemia.  11.Status post cholecystectomy.  12.Status post appendectomy.  13.Status post hysterectomy.  14.Cerebrovascular disease with bilateral carotid stenosis as noted above.   PLAN:  The patient presents in the office today for post-hospitalization  follow-up.  From a cardiovascular standpoint, she seems to be stable  overall.  She has some atypical chest pains from time to time.  These were  relieved by Rolaids recently.  Her grafts at catheterization recently were  patent.  She had good LV function.  She has had no further syncope.  It  should be noted that her syncope occurred in a setting of walking up five  flights of steps.  No further work-up at this time.  She does have bilateral  femoral artery site bruits.  This is likely secondary to her peripheral  arterial disease.  Her groins are stable without hematoma, and there is no  pain. I do not think that she needs ultrasound at this point in time.   She does have a significant cough that is productive of green sputum.  I did  a chest x-ray in the office today.  There does not appear to be any  infiltrate or CHF. The final report is pending.  She does have a 40 pack-  year history of smoking.  I have elected to go ahead place her on  antibiotics with Avelox 400 mg daily for ten days.  She follows with the  [facility] office for Coumadin.  She should have her PT and INR checked in  one week's time given the initiation of Avelox.  I have asked that she  follow up with Everrett Coombe, NP, should her cough not be improving in the  next week.   As noted above, she has an appointment pending with Dr. Victorino Dike for her  anemia.   We will go ahead and get a BMET and a BNP level today given her dyspnea on  exertion.  This is likely multifactorial.  She has never had a history of  heart failure.  We will go ahead and get a CBC today to follow up on her  blood counts.   She needs better  control of her blood pressure.  I have increased her  Norvasc to 10 mg a day.  She will follow up with Dr. Juanda Chance in the next  several weeks.      Tereso Newcomer, PA-C  Electronically Signed     ______________________________  Doylene Canning. Ladona Ridgel, MD   SW/MedQ  DD: 02/06/2006  DT: 02/06/2006  Job #: [id number]   cc:   [name] D. Bean, FNP

## 2010-08-17 NOTE — Discharge Summary (Signed)
[name], [name] NO.:  [id number]   MEDICAL RECORD NO.:  [id number]          PATIENT TYPE:  INP   LOCATION:  3704                         FACILITY:  [facility]   PHYSICIAN:  Madolyn Frieze. Jens Som, MD, FACCDATE OF BIRTH:  1931/12/17   DATE OF ADMISSION:  01/15/2006  DATE OF DISCHARGE:  01/25/2006                                 DISCHARGE SUMMARY   PRIMARY CARDIOLOGIST:  Dr. Charlies Constable.   PRIMARY CARE:  The patient is followed by Atha Starks. Bean, FNP family nurse  practitioner at [facility] at [facility].   DISCHARGING DIAGNOSES:  1. Syncope.  2. Coronary artery disease status post coronary artery bypass graft.  3. Anemia.  4. Anticoagulation therapy.  5. History of abdominal aortic aneurysm repair.  6. History of pulmonary embolus.   PROCEDURES THIS ADMISSION:  1. Include a stress Myoview on January 19, 2006 showed mild anterior      ischemia with normal EF. On cardiac catheterization on January 20, 2006      by Dr. Rollene Rotunda showing patent grafts.  2. Right femoral artery Doppler performed on January 20, 2006 showed no      obvious evidence of pseudo aneurysm or AV fistula.   HOSPITAL COURSE:  Mrs.  Donna Reese is a 74-year Caucasian female with history  of CAD status post CABG who presented to [facility] on day of admission  complaining of syncope in the setting of angina. Apparently [name] had  been doing well since her bypass in 2003 until the evening prior to this  admission when she had walked up five flights of steps in a theatre and  developed substernal chest discomfort associated with shortness of breath  and diaphoresis. After arriving at her aisle, she did not remember what  happened next but apparently lost consciousness. The patient states she was  not seen in the hospital for evaluation that night and instead continued to  watch the show that she had come to see. She did get evaluated at [facility] the following Saturday, did  not receive any therapy or x-ray and then  she saw Billie D. Bean, FNP the following week and was referred to [facility] for further evaluation. Given the patient's history it was decided the  patient needed to be admitted and have further evaluation. An echocardiogram  was done on October 18, and this showed an ejection fraction of 65%.  There  was mild to moderate mitral annular calcification. Left atrium was dilated.  The estimated peak pulmonary artery systolic pressure was moderately to  markedly increased. Carotid Dopplers were also obtained on October 18, that  showed bilateral mild to moderate calcific plaque, 40-60% ICA stenosis  highest end of scale. The patient had no further episodes of chest  discomfort or syncope. It was decided that the patient needed further  evaluation.  She was taken to the cath lab on January 17, 2006 by Dr.  Excell Seltzer. He attempted a cardiac catheterization through right groin access.  He was unable to access the right femoral artery with a 6-French sheath but  he was able to pass a wire beyond distal aorta. Angiogram showed occluded  aorta just above the iliac bifurcation.  Left common iliac occlusion,  diffusely diseased right common iliac. The patient had a history of AAA  repair.  He questioned the aorta bifemoral.  He considered alternative  access through the left brachial but the patient's INR was 1.8 and he felt  the risk of the brachial hematoma was too high.  The patient had a history  of left radial grafts so radial access was not possible.  The patient also  had a history of alignment graft to right arm access not useful, he  recommended doing an Adenosine Myoview and reevaluate. The patient went for  a stress Myoview on the twenty-first with results as stated above. Dr.  Antoine Poche proceeded with cardiac catheterization on October 22, which showed  an ejection fraction of 65%, RIMA and LIMA both widely patent. The patient  with native vessel  disease and patent grafts.  He recommended medical  management for hypertension. The patient was on Norvasc, losartan and  hydrochlorothiazide at home.  They resumed labetalol 300 mg b.i.d. also. The  patient chest pain completely resolved, Coumadin resumed with plans to  discharge when INR therapeutic. The patient bridged with Lovenox during  hospitalization. On day of discharge the patient's INR 2.2, no complaints of  chest discomfort, cath site stable, blood pressure 154/60.  The patient  discharged home with instructions to follow up outpatient. She was given the  post cardiac catheterization discharge instructions.  She is to continue on  low-sodium, low-fat, low-cholesterol ADA diet.  Follow up with Dr. Juanda Chance  November 7 at [facility] D. Bean, FNP within 1-2 weeks. The patient agreed  to call for appointment.  She is scheduled for a PT/INR Thursday November 1  at 2:00 p.m. at [facility] with dosing to be followed by [name] D. Bean,  FNP.   MEDICATIONS AT DISCHARGE:  1. Coumadin 3 mg daily.  2. Calcium daily.  3. Cozaar 100 mg daily.  4. Fish old daily.  5. Neurontin 200 mg t.i.d.  6. Hydrochlorothiazide 25.  7. Klor-Con 20 mEq daily.  8. Labetalol 400 mg b.i.d.  9. Lipitor 20 at bedtime.  10.Zoloft 50 mg daily.  11.Tramadol and acetaminophen as previously ordered p.r.n.  12.Metformin 500 mg 2 tablets b.i.d. The patient instructed not to resume      until following Sunday.   NEW MEDICATIONS INCLUDE:  1. Norvasc 5 mg daily.  2. Colace stool softener p.r.n.  3. Aspirin 81 mg daily.   Duration of discharge encounter 30 minutes.      [name], ACNP      Madolyn Frieze. Jens Som, MD, [facility]  Electronically Signed    MB/MEDQ  D:  02/10/2006  T:  02/10/2006  Job:  [id number]   cc:   [name] D. Bean, FNP

## 2010-08-17 NOTE — Assessment & Plan Note (Signed)
Currently may be over-diuresesed without significant benefit with respect to dyspnea. Will decrease torsemide dosing and follow.

## 2010-08-17 NOTE — H&P (Signed)
NAMEKAILIE, POLUS NO.:  [id number]   MEDICAL RECORD NO.:  [id number]          PATIENT TYPE:  INP   LOCATION:  3703                         FACILITY:  [facility]   PHYSICIAN:  Luis Abed, MD, FACCDATE OF BIRTH:  14-Sep-1931   DATE OF ADMISSION:  01/15/2006  DATE OF DISCHARGE:                                HISTORY & PHYSICAL   PATIENT PROFILE:  A 77 year old white female with a history of CAD and CABG  who presents following an episode of syncope in the setting of angina.   PROBLEM LIST:  1. Syncope.      a.     Previously documented normal left ventricular function.  2. Coronary artery disease.      a.     September 04, 2001, coronary artery bypass graft x2 with the left       internal mammary artery to the left anterior descending artery and       left renal artery to the left circumflex.  3. Peripheral vascular disease with bilateral lower extremity claudication      status post run off, June 28, 2004.  4. History of abdominal aortic aneurysm status post repair by Dr. Hart Rochester      in 1997.  5. History of deep vein thrombosis and PE on chronic anticoagulation.  6. Peptic ulcer disease.  7. Hypertension.  8. Hyperlipidemia.  9. Status post cholecystectomy.  10.Status post appendectomy.  11.Status post hysterectomy.  12.Bilateral carotid bruits.  13.Intermittent right-sided weakness.   HISTORY OF PRESENT ILLNESS:  A 77 year old married, white female with  history of CAD status post CABG x2 in 2003.  Since CABG, she has done  relatively well, until last Friday night, January 10, 2006, when, after  walking up 5 flights of steps in a theater, she developed 7 to 8 out of 10  substernal chest pressure associated with shortness of breath and marked  diaphoresis.  After arriving at her isle, she does not remember what  happened next, but apparently lost consciousness falling onto the concrete  steps and then over a chair, striking her chin, chest, left hand, and  legs.  Per her husband, he thinks she lost consciousness for about 1 to 2 minutes.  When she came to, she remembers that there were a lot of people around her,  but did not recall the fall.  She continued to have some shortness of breath  and diaphoresis, but no additional chest pain.  She was not seen in the  hospital for evaluation that night, instead watched the show that she was  there to see.  She came back home on the tour bus on Saturday and went to  urgent care and, apparently, did not receive any therapies or x-rays.  Since  her fall, she still has soreness, but has also has had intermittent resting  substernal chest heaviness.  She saw Everrett Coombe and was referred to [facility] today.   ALLERGIES:  CODEINE, DARVOCET, DEMEROL, MORPHINE, AND PENICILLIN.   HOME MEDICATIONS:  1. Calcium 500 mg b.i.d.  2. Coumadin 3 mg daily.  3. Cozaar 100 mg daily.  4. Fish oil 1000 mg b.i.d.  5. Neurontin 200 mg t.i.d.  6. Hydrochlorothiazide 25 mg daily.  7. Klor-Con 20 mEq daily.  8. Labetalol 300 mg b.i.d.  9. Lipitor 20 mg q.h.s.  10.Metformin 500 mg b.i.d.  11.Sertraline 50 mg daily.  12.Tramadol/acetaminophen 37.5/325 mg 2 tabs q.8 hours.   FAMILY HISTORY:  Mother died at age 15 of cancer.  Father died at age 71 of  stroke.  She has a brother who had CABG and another brother who had an MI.   SOCIAL HISTORY:  She lives in [place] with her husband.  She is a  retired Education officer, environmental.  She has a 40-pack-year history of tobacco abuse,  quitting in 1993.  She denies any alcohol, drugs, and does not routinely  exercise.   REVIEW OF SYSTEMS:  Positive for diaphoresis associated with symptoms on  January 12, 2006.  She has multiple ecchymotic areas as described above.  She had chest pain, shortness of breath, diaphoresis, dyspnea on exertion,  syncope, and claudication.  She reports, on questioning, that she has  episodes of weakness occurring once weekly involving the right side of  her  body, noted mostly during ambulation, causing her to lose balance and fall  into the wall, and then she has to use the wall to steady herself and walk  to a chair, where she sits for a couple of minutes and the symptoms resolve.  She denies any visual disturbances, denies any drooling or slurred speech  during these episodes of weakness.  She has musculoskeletal pain as a result  of her falls and also a history of diabetes.  All other systems reviewed and  negative.   PHYSICAL EXAMINATION:  VITAL SIGNS:  Temperature 97.5, heart rate 63,  respirations 22, blood pressure 178/58.  GENERAL:  Pleasant white female in no acute distress.  Awake, alert, and  oriented x3.  NECK:  She has bilateral carotid bruits without JVD.  LUNGS:  Respirations regular and unlabored.  Clear to auscultation.  CARDIAC:  Regular S1, S2.  No S3, S4, or murmurs.  ABDOMEN:  Obese, soft, nontender, nondistended.  Bowel sounds presents.  EXTREMITIES:  Warm, dry, pink.  No clubbing, cyanosis, or edema.  Dorsalis  pedis, posterior tibia pulses 1+ and equally bilaterally.  She has a large  area of ecchymosis on her later left thigh.  HEENT:  Atraumatic, normocephalic.  She does have some ecchymosis on the  left chin.  SKIN:  Warm and dry with ecchymosis of the left chin, left chest, left hand,  left thigh, left foot.  NEUROLOGIC:  Grossly intact and nonfocal.  Strength is 5/5 in all  extremities.   LABORATORY DATA:  Chest x-ray is pending.  EKG shows sinus brady.  Normal  axis and a rate of 59 beats per minute.  No acute ST-T changes.  Lab work is  pending.   ASSESSMENT:  1. Syncope/coronary artery disease.  Patient presents following an episode      of what sounds like unstable angina associated with shortness of breath      and diaphoresis and resulting in a syncopal episode in which she lost     consciousness and was out for approximately 1 to 2 minutes.  Given her      history of coronary artery disease  and the fact that she did well      following her coronary artery bypass graft until Friday night, strong  suspicion for ischemia with potential resultant arrhythmia, possibly      VT, to cause syncope.  Will obtain a 2-D echocardiogram to evaluate      ejection fraction.  She will likely require cardiac catheterization and      Dr. Myrtis Ser will discuss this further with Dr. Juanda Chance.  We will continue      her home medications, which include beta-blocker, ARB, statin, and will      add aspirin.  2. Right-sided weakness. The patient has felt, approximately, weekly      spells of right-sided weakness.  She has a significant history of      peripheral vascular disease as well as carotid disease and has      bilateral bruits.  Will obtain a carotid ultrasound.  Will also obtain      a head CT.  Question if she is experiencing transient ischemic attacks      at home.  3. Hypertension.  This is poorly controlled.  Will continue her home      medication, she has not taken them today, and titrate as necessary.  4. Hyperlipidemia.  Will check lipids and liver function tests and      continue statin therapy.  5. Type 2 diabetes mellitus.  Hold metformin as she will likely undergo      cardiac catheterization during this admission.  6. Peripheral neuropathy.  Continue Neurontin.  7. History of DVT and PE.  Coumadin on hold.  Will bridge her with      heparin.  8. Trauma secondary to fall.  Will obtain x-rays of her left hand, foot,      and leg.  Chest x-ray to look at her ribs as she does have some right      rib tenderness.  9. Depression.  Continue sertraline.     ______________________________  Nicolasa Ducking, ANP    ______________________________  Luis Abed, MD, [facility]    CB/MEDQ  D:  01/15/2006  T:  01/15/2006  Job:  [id number]

## 2010-08-17 NOTE — Assessment & Plan Note (Signed)
[facility]                            CARDIOLOGY OFFICE NOTE   Donna Reese, Donna Reese                      MRN:          [id number]  DATE:04/14/2006                            DOB:          03-May-1931    PRIMARY CARE PHYSICIAN:  Weston Anna at [facility], Family Nurse  Practitioner.   CLINICAL HISTORY:  Donna Reese is 77 years old and has coronary artery  disease and had coronary bypass surgery in 2003 with recently documented  patent grafts.  She was admitted with a syncopal episode last fall and  no cause was found.   I saw her last November and she was complaining of increased symptoms of  claudication.  She had an abdominal aortic aneurysm repair in 1997 by  Dr. Hart Rochester and had known peripheral vascular disease with claudication.  We did ABIs which were worse.  They were 0.61 on the right and 0.71 on  the left.  Dr. Excell Seltzer, and subsequently Dr. Hart Rochester saw her and felt that  the options for treating her common femoral occlusive disease were  limited due to the presence of her aortobifemoral bypass grafts and  recommended continuing medical therapy.  She was started on Pletal.   She says that she has recently developed increased edema, mostly in the  left lower extremity and some increased shortness of breath.  She  wonders if it is related to the Pletal.   PAST MEDICAL HISTORY:  Significant for paroxysmal atrial fibrillation,  history of deep vein thrombophlebitis and pulmonary embolism, currently  treated with Coumadin therapy, hypertension and hyperlipidemia.  Diabetes.   CURRENT MEDICATIONS:  Tramadol, labetalol, gabapentin, Hematogen,  Coumadin, sertraline, hydrochlorothiazide, Cozaar, metformin,  amlodipine, Lipitor, cilostazol, and Pletal.   EXAMINATION:  Her blood pressure is 130/40 and the pulse 72 and regular.  The venous pulsations were visible 2 cm above the clavicle.  The carotid  pulses were full.  CHEST:  Clear without  rales or rhonchi.  CARDIO:  Rhythm was regular.  No murmurs, rubs, or gallops.  ABDOMEN:  Soft without organomegaly.  There is a midline surgical scar.  I had difficulty feeling pedal pulses on either side.  There was 2+  edema on the left, much of which was nonpitting, and there was 1+ edema  on the right.   Her electrocardiogram showed sinus rhythm with an old septal poor R wave  progression and nonspecific ST-T changes.   IMPRESSION:  1. Shortness of breath and edema of the lower extremities, left      greater than right, of uncertain etiology, possibly aggravated by      Pletal.  2. Coronary artery disease, status post bypass graft surgery in 2003      with recent documentation of patent grafts.  3. Peripheral vascular disease, status post abdominal aortic aneurysm      repaired in 1997 with residual peripheral vascular disease, reduced      indices, and claudication.  4. History of paroxysmal atrial fibrillation.  5. History of deep vein thrombophlebitis and pulmonary embolism on      Coumadin therapy.  6. Hypertension.  7. Hyperlipidemia.   RECOMMENDATIONS:  Ms. Hollar has edema of her left lower extremity and  elevated neck veins, and symptoms of shortness of breath.  This all  corresponds temporally to starting the Pletal, and Pletal has listed as  a side effect congestive heart failure, and I think it could be  contributing to this.  We will plan to stop the Pletal.  We will switch  her to hydrochlorothiazide 25 to Lasix 40 a day.  We will get a baseline  BMP and BNP,  and get a followup BMP and BNP in a week.  We will also  get a CBC.  We will get a 2D echo to evaluate her LV function and  diastolic function as well.  We will get venous Dopplers of the lower  extremities to rule out deep vein thrombophlebitis.  I will plan to see  her back in followup in about 3 weeks.     Bruce Elvera Lennox Juanda Chance, MD, [facility]  Electronically Signed    BRB/MedQ  DD: 04/14/2006  DT:  04/14/2006  Job #: [id number]

## 2010-08-17 NOTE — Letter (Signed)
March 06, 2006    Everardo Beals. Juanda Chance, MD, [facility]  [address]  [place], [place] [address]   RE:  Donna, Reese  MRN:  [id number]  /  DOB:  04/14/1931   Dear Smitty Cords:   I saw Donna Reese as an outpatient today for evaluation of her  claudication and peripheral arterial disease.  As you know, she is a  very pleasant 77 year old woman with both coronary artery disease as  well as peripheral arterial disease whose main complaint at present is  leg pain.  She describes episodes of right calf and thigh cramping as  well as soreness.  Her symptoms occur both at rest and with exertion.  She also has pain throughout the left leg but her right leg symptoms  seem to be more severe.  Complicating her history, she has peripheral  neuropathy from longstanding diabetes and she has difficulty with gait  due to balance problems.  She complains of numbness in both feet.  She  denies any history of nonhealing ulcers.   As you know her well, I will not repeat the remainder of her cardiac  history.  She has had a recent hospitalization and cardiac  catheterization that demonstrated patent bypass grafts and she has been  found to have preserved left ventricular function.  Her estimated left  ventricular ejection fraction by Myoview was 67%.  Of note, I attempted  a cardiac catheterization through right femoral artery access and was  unable to pass a wire beyond her totally occluded distal aorta.  Following that attempt, Dr. Antoine Poche performed a cardiac catheterization  a few days later through her left femoral artery by access her graft on  that side.   In reviewing her records, Dr. Hart Rochester performed an abdominal aortogram  with bilateral lower extremity runoff in March of 2006.  She was found  to have a widely patent aortobifemoral graft through the proximal  portions with focal areas of moderate stenosis at the distal limbs of  the graft in the area of the bilateral common femoral  anastomoses of  approximately 70%.  She also had focal right superficial femoral artery  stenosis of 80% in the mid thigh and two vessel runoff bilaterally.   She recently underwent a noninvasive arterial Duplex scan that  demonstrated volumes consistent with high grade stenoses of the common  femoral arteries bilaterally as well as high grade stenosis in the  proximal left superficial femoral artery and mid right superficial  femoral artery.  Her ABI on the right was 0.61 and ABI on the left was  0.75.   PAST MEDICAL HISTORY:  1. Coronary artery disease status post bypass surgery with patent      grafts demonstrated on recent cardiac catheterization.  2. Peripheral arterial disease.  Patient is status post aortobifemoral      bypass in 1997.  Angiogram from 2006 as described above.  3. Moderate carotid stenosis bilaterally.  4. Diabetes.  5. Hiatal hernia.  6. Depression.  7. Gastroesophageal reflux disease.  8. Arthritis.  9. Hypertension.  10.History of syncope.  11.Remote pulmonary embolus.   CURRENT MEDICATIONS:  1. Tramadol/acetaminophen.  2. Labetalol 300 mg b.i.d.  3. Gabapentin 100 mg t.i.d.  4. Calcium plus D 500 mg b.i.d.  5. Norvasc 5 mg b.i.d.  6. Coumadin.   SOCIAL HISTORY:  The patient is married.  She has one child.  She has a  history of tobacco use but quit in 1992.  She does  not drink alcohol.   FAMILY HISTORY:  Her father died at age 41 of stroke.  Mother died at  age 99 of cancer.   REVIEW OF SYSTEMS:  A complete 12-point review of systems was performed.  Pertinent positives included reflux disease, depression, dizziness,  shortness of breath, ankle swelling and headaches.   PHYSICAL EXAMINATION:  GENERAL APPEARANCE:  The patient is alert and  oriented in no acute distress.  VITAL SIGNS:  Her weight is 203 pounds, blood pressure 152/42, heart  rate 75, respiratory rate 16.  HEENT:  Normal.  NECK:  Normal carotid upstrokes with bilateral bruits.  Jugular venous  pressure  is normal.  There is no thyromegaly or thyroid nodules.  LUNGS:  Clear to auscultation bilaterally.  CARDIOVASCULAR:  The apex is discrete and nondisplaced.  The heart is  regular rate and rhythm without murmurs or gallops.  ABDOMEN:  Soft, nontender, no organomegaly, no abdominal bruits.  EXTREMITIES:  There is no clubbing or cyanosis.  There is trace  bilateral edema.  There are bilateral femoral artery bruits.  Posterior  tibial pulses are 1+ bilaterally and easily obtainable with Dopplers.  Dorsalis pedis pulses are not palpable and there is a faint right  dorsalis pedis by Doppler.  The dorsalis pedis on the left is not  obtainable via Doppler.  SKIN: Warm and dry without rash.   ASSESSMENT:  Donna Reese is a 77 year old woman with significant  lifestyle limiting claudication.  Symptoms as well as her ABIs are worse  on the right than the left.  Her treatment options are somewhat limited  in that she has significant common femoral arterial disease involving  her aortobifemoral bypass graft.  The presence of the aortobifemoral  bypass also precludes contralateral access for percutaneous intervention  and necessitates antegrade access which is technically quite difficult  considering Donna Reese' body habitus.  Since she does not exhibit  evidence of critical limb ischemia, she may be best served by  conservative therapy.  I would like to review her case with Dr. Hart Rochester,  who is her vascular surgeon and has followed her for several years.  Will review treatment options with him and make further recommendations  at that time.  I have elected to try her on Pletal to see if this  provides some symptomatic improvement in the interim.   Bruce, thanks again for allowing me to evaluate Donna Reese.  I will be  in touch with her after I review her case with Dr. Hart Rochester.    Sincerely,     Veverly Fells. Excell Seltzer, MD  Electronically Signed    MDC/MedQ  DD:  03/06/2006  DT: 03/07/2006  Job #: [id number]   CC:    [name] D. Bean, FNP

## 2010-08-17 NOTE — Assessment & Plan Note (Signed)
Cr 2.3 today Stable  Decrease torsemide to 20 mg bid, then recheck BMP in 1 week. Will cc Dr. Excell Seltzer with results - pt request lab draw at our office

## 2010-08-17 NOTE — Assessment & Plan Note (Signed)
Donna Reese                            CARDIOLOGY OFFICE NOTE   Donna Reese, Donna Reese                      MRN:          [id number]  DATE:05/08/2006                            DOB:          April 20, 1931    PRIMARY CARE PHYSICIAN:  Atha Starks. Bean, FNP   CLINICAL COURSE:  Donna Reese returned for follow up management of her  congestive heart failure/volume overload.  She is 77 year old and had  coronary bypass surgery in 2003 with subsequent documentation of patent  grafts.  She was recently started on Pletal for peripheral vascular  disease by Dr. Excell Seltzer and Dr. Hart Rochester.  She developed some shortness of  breath and edema and I saw her in consultation.  We stopped the Pletal  and we switched her from hydrochlorothiazide 25 a day to Lasix 40 a day.  She is now improved and feeling somewhat better.   She hurt her back by twisting the other day and this is her chief  complaint today.   Her past medical history is significant for paroxysmal atrial  fibrillation, history of deep vein thrombophlebitis and pulmonary  embolism, hypertension and hyperlipidemia and diabetes.   CURRENT MEDICATIONS:  Include:  Tramadol, labetalol, gabapentin,  hematogen, Coumadin, sertraline, Cozaar, metformin, amlodipine, Lipitor  and Lasix.   EXAMINATION:  Today the blood pressure was 145/58 and the pulse 69 and  regular.  The venous pulse actually were visible 1 cm above the clavicle.  CHEST:  Was clear. With no rales or rhonchi.  CARDIAC:  Rhythm was regular.  The heart sounds were normal.  I could  hear no murmurs or gallops.  ABDOMEN:  Was soft with normal bowel sounds.  There was no  hepatosplenomegaly.  There was 1+ edema on the left and trace edema on the right, most of  which was non-pitting.   An echocardiogram was done which showed an ejection fraction of 60%.  There was very mild thickening of the left ventricular wall and  posterior wall and septum.  We have  venous Doppler studies of the lower  extremity and there was no evidence of thrombophlebitis.   IMPRESSION:  1. Congestive heart failure related to diastolic dysfunction and      aggravated by Pletal, now improved, but still mild volume overload.  2. Coronary artery disease status post coronary artery bypass graft      surgery 2003 with recent documentation of patent grafts.  3. Ejection fraction 60%.  4. Peripheral vascular disease status post abdominal aneurysm repair      in 1997 with reduced indices and claudication on medical therapy.  5. History of paroxysmal atrial fibrillation.  6. History of deep vein thrombophlebitis and pulmonary embolism on      Coumadin therapy.  7. Hypertension, hyperlipidemia.   RECOMMENDATIONS:  Donna Reese is improved with the addition of Lasix and  stopping the Pletal.  She still has mild volume overload.  We will  increase her Lasix from 40 to 60 a day.  We will get a Chem-7 and a BNP  today and we will repeat a Chem-7 next week.  I will plan to see her  back in 6 months.     Bruce Elvera Lennox Juanda Chance, MD, [facility]  Electronically Signed    BRB/MedQ  DD: 05/08/2006  DT: 05/08/2006  Job #: [id number]

## 2010-08-17 NOTE — Discharge Summary (Signed)
NAME:  [name], Donna Reese                         ACCOUNT NO.:  [id number]   MEDICAL RECORD NO.:  [id number]                   PATIENT TYPE:  NP   LOCATION:  2041                                 FACILITY:  [facility]   PHYSICIAN:  Levin Erp. Steward, P.A.                DATE OF BIRTH:  1931/10/04   DATE OF ADMISSION:  08/30/2001  DATE OF DISCHARGE:  09/12/2001                                 DISCHARGE SUMMARY   ADMISSION DIAGNOSIS:  Chest pain.   SECONDARY DIAGNOSIS:  1. Carotid artery stenosis.  2. Hypertension.  3. Hyperlipidemia.   DISCHARGE DIAGNOSIS:  Coronary artery disease.   HOSPITAL COURSE:  [name] was admitted to [facility] on  08/30/01, secondary to epigastric chest pain.  Because of this, she  underwent cardiovascular work up, including cardiac catheterization.  For  this, Ramon Dredge B. Tyrone Sage, M.D. was consulted.  On 09/04/01, he performed a  coronary artery bypass graft times two with the left internal mammary artery  anastomosis to the left anterior descending artery and the left radial  artery to the circumflex artery.  No complications noted during the  procedure.   Postoperatively, the patient had the hospital course complicated by atrial  fibrillation.  Because of this, she was anticoagulated with Coumadin.  She  was rate controlled with Digoxin and Lopressor.  She converted back to  normal sinus rhythm but was maintained on Coumadin.  She was subsequently  deemed stable for discharge on postoperative day number eight, September 12, 2001.   MEDICATIONS:  Medications at the time of discharge:  1. Coumadin.  The patient was to alternate 5 mg and 2.5 mg doses daily.  2. Imdur 30 mg one daily.  3. Digoxin 0.125 mg one daily.  4. Hydrochlorothiazide 25 mg one daily.  5. Lopressor 50 mg one tablet twice daily.  6. Cozaar 75 mg one and a half tablets daily.  7. Combivent inhaler two puffs four times daily.  8. Ultram 50 mg one every six hours as needed for  pain.  9. The patient was also to continue her Protonix and her home eye drops.   ACTIVITY:  The patient was told no driving or strenuous activity.   DISCHARGE DIET:  Low fat and low salt.   DISCHARGE INSTRUCTIONS:  Wound care:  The patient was told she could shower  and clean her incisions with soap and water.   DISPOSITION:  Home.   FOLLOW UP:  The patient was told to see Ramon Dredge B. Tyrone Sage, M.D. on  Thursday, July 17 at 10 o'clock, and she was told to bring chest x-rays with  her.  She was told to see Dr. Jacobo Forest on June 30 at 3 o'clock p.m.  She was  told to have her INR drawn by home health nurse on 06/16 with results to the  [facility].  Nat Math, P.A.    BGS/MEDQ  D:  11/09/2001  T:  11/13/2001  Job:  [id number]

## 2010-08-22 ENCOUNTER — Other Ambulatory Visit: Payer: Medicare Other | Admitting: *Deleted

## 2010-08-24 ENCOUNTER — Other Ambulatory Visit (INDEPENDENT_AMBULATORY_CARE_PROVIDER_SITE_OTHER): Payer: Medicare Other | Admitting: *Deleted

## 2010-08-24 ENCOUNTER — Other Ambulatory Visit: Payer: Medicare Other | Admitting: *Deleted

## 2010-08-24 ENCOUNTER — Encounter (INDEPENDENT_AMBULATORY_CARE_PROVIDER_SITE_OTHER): Payer: Medicare Other | Admitting: Cardiology

## 2010-08-24 DIAGNOSIS — N189 Chronic kidney disease, unspecified: Secondary | ICD-10-CM

## 2010-08-24 DIAGNOSIS — N259 Disorder resulting from impaired renal tubular function, unspecified: Secondary | ICD-10-CM

## 2010-08-24 DIAGNOSIS — I701 Atherosclerosis of renal artery: Secondary | ICD-10-CM

## 2010-08-24 DIAGNOSIS — I5030 Unspecified diastolic (congestive) heart failure: Secondary | ICD-10-CM

## 2010-08-24 LAB — BASIC METABOLIC PANEL
CO2: 37 mEq/L — ABNORMAL HIGH (ref 19–32)
Chloride: 93 mEq/L — ABNORMAL LOW (ref 96–112)
Creatinine, Ser: 2.2 mg/dL — ABNORMAL HIGH (ref 0.4–1.2)
Potassium: 4.3 mEq/L (ref 3.5–5.1)

## 2010-08-28 ENCOUNTER — Telehealth: Payer: Self-pay | Admitting: Cardiovascular Disease

## 2010-08-28 NOTE — Telephone Encounter (Signed)
BMP 08/24/10  Notes Recorded by Roger Shelter. Fiato, CMA on 08/28/2010 at 3:22 PM Pt aware of lab results. Dr. Patsy Lager and Dr. Excell Seltzer notified. Danielle Rankin

## 2010-08-28 NOTE — Telephone Encounter (Signed)
Pt would like results of PV study

## 2010-08-29 ENCOUNTER — Encounter: Payer: Self-pay | Admitting: Cardiovascular Disease

## 2010-09-06 ENCOUNTER — Ambulatory Visit (INDEPENDENT_AMBULATORY_CARE_PROVIDER_SITE_OTHER): Payer: Medicare Other | Admitting: Family Medicine

## 2010-09-06 ENCOUNTER — Encounter: Payer: Self-pay | Admitting: Family Medicine

## 2010-09-06 ENCOUNTER — Ambulatory Visit (INDEPENDENT_AMBULATORY_CARE_PROVIDER_SITE_OTHER)
Admission: RE | Admit: 2010-09-06 | Discharge: 2010-09-06 | Disposition: A | Discharge status: Home / Self Care | Payer: Medicare Other | Source: Ambulatory Visit | Attending: Family Medicine | Admitting: Family Medicine

## 2010-09-06 VITALS — BP 130/56 | HR 64 | Temp 98.2°F | Ht 64.0 in | Wt 176.5 lb

## 2010-09-06 DIAGNOSIS — N259 Disorder resulting from impaired renal tubular function, unspecified: Secondary | ICD-10-CM

## 2010-09-06 DIAGNOSIS — R0602 Shortness of breath: Secondary | ICD-10-CM

## 2010-09-06 DIAGNOSIS — J449 Chronic obstructive pulmonary disease, unspecified: Secondary | ICD-10-CM

## 2010-09-06 DIAGNOSIS — I5022 Chronic systolic (congestive) heart failure: Secondary | ICD-10-CM

## 2010-09-06 DIAGNOSIS — N19 Unspecified kidney failure: Secondary | ICD-10-CM

## 2010-09-06 DIAGNOSIS — J90 Pleural effusion, not elsewhere classified: Secondary | ICD-10-CM | POA: Insufficient documentation

## 2010-09-06 LAB — BASIC METABOLIC PANEL
BUN: 65 mg/dL — ABNORMAL HIGH (ref 6–23)
Chloride: 89 mEq/L — ABNORMAL LOW (ref 96–112)
GFR: 21.95 mL/min — ABNORMAL LOW (ref >60.00)
Potassium: 3.9 mEq/L (ref 3.5–5.1)
Sodium: 137 mEq/L (ref 135–145)

## 2010-09-06 MED ORDER — MOMETASONE FURO-FORMOTEROL FUM 200-5 MCG/ACT IN AERO: 2.0000 | INHALATION_SPRAY | Freq: Two times a day (BID) | RESPIRATORY_TRACT | Status: DC

## 2010-09-06 NOTE — Progress Notes (Signed)
DIAHN Reese, a 77 y.o. female presents today in the office for the following:    77 year old female, very well known to me, status post hospital discharge on 08/01/2010. She was admitted with a congestive heart failure exacerbation, worsening shortness or breath and failure to improve with outpatient management.  The patient also has significant COPD, O2 dependent. Significant history for renal insufficiency which has been worsening in the acute setting over the last month, and additionally she has multiple medical problems noted below. At baseline, a month or so ago, the patient had only mild renal insufficiency. Within the last 2 weeks, her creatinine and BUN had increased. Of note, her diuretic regimen has been altered, and now she is taking torsemide 20 mg p.o. B.i.d (down from 40 mg bid)., and much of the time taking Zaroxolyn 5 mg every other day. Her weight is up 10 pounds.  Currently, she states she is having shortness of breath at rest. She does not feel like the manipulation of her diuretics has made any difference in regards to her shortness of breath. She does note that oxygen makes a significant difference and helps her a great deal. She is using oxygen at baseline and at all times now. She stopped her Dulera Compliant with Spiriva and oxygen  She is not eating or drinking that well, and she states she does not feel that well.   Patient Active Problem List  Diagnoses  . DIABETES MELLITUS, TYPE II  . HYPERLIPIDEMIA  . GOUT, ACUTE  . DEPRESSION  . MIGRAINE WITHOUT AURA  . POLYNEUROPATHY IN DIABETES  . HYPERTENSION  . CAD, AUTOLOGOUS BYPASS GRAFT  . Atrial fibrillation  . CHRONIC SYSTOLIC HEART FAILURE  . RENAL ARTERY STENOSIS  . PERIPHERAL VASCULAR DISEASE  . COPD  . PEPTIC ULCER DISEASE  . RENAL INSUFFICIENCY  . DEGENERATIVE DISC DISEASE  . OSTEOPENIA  . Personal History of Venous Thrombosis and Embolism  . SOB (shortness of breath)  . Peripheral edema  . Diastolic  heart failure  . Pleural effusion   Past Medical History  Diagnosis Date  . Diabetes mellitus   . COPD (chronic obstructive pulmonary disease)   . Diverticulosis   . Osteopenia   . PUD (peptic ulcer disease)   . Peripheral arterial disease   . Hypertension   . Hyperlipidemia   . Diastolic heart failure     a. echo 9/11: EF 60-65%, mild LVH, grade 2 diast dysfxn, AV mean gradient 9 mmHg, mild MR, RVE, RVSP 59  . Coronary artery disease     a. s/p CABG;  b. cath 8/09: LM occluded; RCA 50%; L-LAD ok, Radial-OM ok; bilat renal art stenosis  . Renovascular hypertension   . Renal artery stenosis     a. s/p L RA stent 8/09  . Paroxysmal a-fib     no coumadin 2/2 GI Blee  . DVT (deep venous thrombosis)   . Pulmonary embolus     s/p IVC filter  . History of gastrointestinal bleeding     no ASA or coumadin  . Contrast dye induced nephropathy   . Orthostatic hypotension    Past Surgical History  Procedure Date  . Appendectomy   . Cholecystectomy   . Abdominal hysterectomy   . Coronary artery bypass graft 09-04-2001    carotid u/s unchnaged 40-50% ICA stenosis 12/1998  . Cataract extraction, bilateral 2001  . Abdominal aortic aneurysm repair 1997  . Nm myoview ltd     neg EF 76%--06/26/2004  .  Carotid doppler     12/05 unchanged 9-06  . Cardiac catheterization 09-03-01  . Esophagogastroduodenoscopy     abnormal 2007, esophageal stricture 10-12-03-07 gastritis and major bleef  . Colonoscopy     abnormal 03-13-06  . Vena cava filter placement 07-2007    Dr. Excell Seltzer  . Renal artery doppler 11-17-2008    severe iliac stenosis   History  Substance Use Topics  . Smoking status: Former Smoker -- 1.0 packs/day for 40 years    Types: Cigarettes    Quit date: 04/01/1990  . Smokeless tobacco: Not on file  . Alcohol Use: No   Family History  Problem Relation Age of Onset  . Cancer Mother   . Stroke Father   . Stroke Brother   . Heart attack Brother   . Heart attack Brother   . Heart  attack Brother    Allergies  Allergen Reactions  . Codeine   . Meperidine Hcl   . Morphine   . Niacin     REACTION: rash  . Penicillins   . Propoxyphene Hcl   . Sulfonamide Derivatives    Current Outpatient Prescriptions on File Prior to Visit  Medication Sig Dispense Refill  . amLODipine (NORVASC) 10 MG tablet Take 10 mg by mouth daily.        Marland Kitchen ezetimibe-simvastatin (VYTORIN) 10-20 MG per tablet Take 1 tablet by mouth at bedtime.        . ferrous sulfate 325 (65 FE) MG tablet Take 325 mg by mouth daily with breakfast.        . isosorbide mononitrate (IMDUR) 30 MG 24 hr tablet Take 1 tablet (30 mg total) by mouth daily.  30 tablet  11  . ketorolac (ACULAR) 0.4 % SOLN Place 1 drop into the right eye 4 (four) times daily.        Marland Kitchen latanoprost (XALATAN) 0.005 % ophthalmic solution Place 1 drop into both eyes daily.        Marland Kitchen losartan (COZAAR) 100 MG tablet Take 1 tablet (100 mg total) by mouth daily.  30 tablet  11  . metolazone (ZAROXOLYN) 5 MG tablet Use as directed, 30 minutes before AM Torsemide -- ONLY IF YOU HAVE A 3 POUND WEIGHT GAIN  30 tablet  2  . metoprolol (TOPROL-XL) 50 MG 24 hr tablet Take 50 mg by mouth daily.        . mirtazapine (REMERON) 15 MG tablet TAKE 1 TABLET BY MOUTH AT BEDTIME  30 tablet  5  . NON FORMULARY 2 L/min by Nasal route continuous. oxygen       . potassium chloride (KLOR-CON) 10 MEQ CR tablet Take 10 mEq by mouth daily.        Marland Kitchen tiotropium (SPIRIVA) 18 MCG inhalation capsule Place 18 mcg into inhaler and inhale daily.        Marland Kitchen torsemide (DEMADEX) 20 MG tablet Take 1 tablet (20 mg total) by mouth 2 (two) times daily.  40 tablet  0  . triamcinolone (KENALOG) 0.1 % cream Apply topically 2 (two) times daily.  454 g  1  . nitroGLYCERIN (NITROSTAT) 0.4 MG SL tablet Place 0.4 mg under the tongue every 5 (five) minutes as needed.        Marland Kitchen omeprazole (PRILOSEC) 40 MG capsule Take 40 mg by mouth daily.        . OXYCODONE HCL PO Take 5 mg by mouth daily. As needed  for pain        ROS: GEN:  No acute illnesses, no fevers, chills. GI: No n/v/d, decreased PO Pulm: above Using walker  Otherwise, ROS is as per the HPI.   Physical Exam  Blood pressure 130/56, pulse 64, temperature 98.2 F (36.8 C), temperature source Oral, height 5\' 4"  (1.626 m), weight 176 lb 8 oz (80.06 kg).  GEN: WDWN, NAD, Non-toxic, A & O x 3 HEENT: Atraumatic, Normocephalic. Neck supple. No masses, No LAD. Ears and Nose: No external deformity. CV: 3/6 SEM, irreg irreg PULM: CTA B, no wheezes, crackles, rhonchi. No retractions. No resp. distress. No accessory muscle use. ABD: S, NT, ND, +BS. No rebound tenderness. No HSM.  EXTR: 1+ edema, LE NEURO Normal gait.  PSYCH: Normally interactive. Conversant. Not depressed or anxious appearing.  Calm demeanor.   A/P: 1. COPD: I suspect this may be the primary limiting factor in her SOB at this point. Restart Dulera. Cont spiriva and oxygen.  CXR, AP and Lateral Indication: shortness or breath: Findings: Persistent but smaller pleural effusion on the R  Given prior smoking history, check CT of chest without contast to eval effusion.  F/u with Dr. Delton Coombes. Appreciate any suggestions.  2. CHF: decreased pulm congestion on CXR. No crackles. Tolerating current diuretics.  3. CRI: check bmp today

## 2010-09-06 NOTE — Patient Instructions (Signed)
FOR THE NEXT 2 DAYS, TAKE AN EXTRA TORSEMIDE (FLUID PILL) AT LUNCH.  IN THE FUTURE, I WANT YOU TO TAKE AN EXTRA FLUID PILL (TORSEMIDE) AT LUNCH IF YOU WEIGHT GOES UP BY 3 POUNDS.  REFERRAL: GO THE THE FRONT ROOM AT THE ENTRANCE OF OUR CLINIC, NEAR CHECK IN. ASK FOR MARION. SHE WILL HELP YOU SET UP YOUR REFERRAL. DATE: TIME:  Recheck in 1 month

## 2010-09-07 ENCOUNTER — Ambulatory Visit (INDEPENDENT_AMBULATORY_CARE_PROVIDER_SITE_OTHER)
Admission: RE | Admit: 2010-09-07 | Discharge: 2010-09-07 | Disposition: A | Discharge status: Home / Self Care | Payer: Medicare Other | Source: Ambulatory Visit | Attending: Family Medicine | Admitting: Family Medicine

## 2010-09-07 DIAGNOSIS — J90 Pleural effusion, not elsewhere classified: Secondary | ICD-10-CM

## 2010-09-07 DIAGNOSIS — R0602 Shortness of breath: Secondary | ICD-10-CM

## 2010-09-13 ENCOUNTER — Ambulatory Visit: Payer: Medicare Other | Admitting: Family Medicine

## 2010-09-19 ENCOUNTER — Emergency Department (HOSPITAL_COMMUNITY): Payer: Medicare Other

## 2010-09-19 ENCOUNTER — Inpatient Hospital Stay: Admission: AD | Admit: 2010-09-19 | Payer: Self-pay | Source: Ambulatory Visit | Admitting: Cardiology

## 2010-09-19 ENCOUNTER — Ambulatory Visit (INDEPENDENT_AMBULATORY_CARE_PROVIDER_SITE_OTHER): Payer: Medicare Other | Admitting: Family Medicine

## 2010-09-19 ENCOUNTER — Inpatient Hospital Stay (HOSPITAL_COMMUNITY)
Admission: EM | Admit: 2010-09-19 | Discharge: 2010-09-21 | DRG: 293 | Disposition: A | Discharge status: Home Health | Payer: Medicare Other | Attending: Cardiology | Admitting: Cardiology

## 2010-09-19 ENCOUNTER — Encounter: Payer: Self-pay | Admitting: Family Medicine

## 2010-09-19 DIAGNOSIS — I25810 Atherosclerosis of coronary artery bypass graft(s) without angina pectoris: Secondary | ICD-10-CM

## 2010-09-19 DIAGNOSIS — E119 Type 2 diabetes mellitus without complications: Secondary | ICD-10-CM | POA: Diagnosis present

## 2010-09-19 DIAGNOSIS — I129 Hypertensive chronic kidney disease with stage 1 through stage 4 chronic kidney disease, or unspecified chronic kidney disease: Secondary | ICD-10-CM | POA: Diagnosis present

## 2010-09-19 DIAGNOSIS — I5033 Acute on chronic diastolic (congestive) heart failure: Principal | ICD-10-CM | POA: Diagnosis present

## 2010-09-19 DIAGNOSIS — R609 Edema, unspecified: Secondary | ICD-10-CM

## 2010-09-19 DIAGNOSIS — N183 Chronic kidney disease, stage 3 unspecified: Secondary | ICD-10-CM | POA: Diagnosis present

## 2010-09-19 DIAGNOSIS — I10 Essential (primary) hypertension: Secondary | ICD-10-CM

## 2010-09-19 DIAGNOSIS — J4489 Other specified chronic obstructive pulmonary disease: Secondary | ICD-10-CM | POA: Diagnosis present

## 2010-09-19 DIAGNOSIS — Z882 Allergy status to sulfonamides status: Secondary | ICD-10-CM

## 2010-09-19 DIAGNOSIS — J449 Chronic obstructive pulmonary disease, unspecified: Secondary | ICD-10-CM

## 2010-09-19 DIAGNOSIS — E785 Hyperlipidemia, unspecified: Secondary | ICD-10-CM | POA: Diagnosis present

## 2010-09-19 DIAGNOSIS — Z880 Allergy status to penicillin: Secondary | ICD-10-CM

## 2010-09-19 DIAGNOSIS — Z86718 Personal history of other venous thrombosis and embolism: Secondary | ICD-10-CM

## 2010-09-19 DIAGNOSIS — K279 Peptic ulcer, site unspecified, unspecified as acute or chronic, without hemorrhage or perforation: Secondary | ICD-10-CM | POA: Diagnosis present

## 2010-09-19 DIAGNOSIS — Z9981 Dependence on supplemental oxygen: Secondary | ICD-10-CM

## 2010-09-19 DIAGNOSIS — I4891 Unspecified atrial fibrillation: Secondary | ICD-10-CM | POA: Diagnosis present

## 2010-09-19 DIAGNOSIS — I739 Peripheral vascular disease, unspecified: Secondary | ICD-10-CM | POA: Diagnosis present

## 2010-09-19 DIAGNOSIS — M899 Disorder of bone, unspecified: Secondary | ICD-10-CM | POA: Diagnosis present

## 2010-09-19 DIAGNOSIS — R0602 Shortness of breath: Secondary | ICD-10-CM

## 2010-09-19 DIAGNOSIS — I2510 Atherosclerotic heart disease of native coronary artery without angina pectoris: Secondary | ICD-10-CM | POA: Diagnosis present

## 2010-09-19 DIAGNOSIS — Z951 Presence of aortocoronary bypass graft: Secondary | ICD-10-CM

## 2010-09-19 DIAGNOSIS — K5730 Diverticulosis of large intestine without perforation or abscess without bleeding: Secondary | ICD-10-CM | POA: Diagnosis present

## 2010-09-19 DIAGNOSIS — I5022 Chronic systolic (congestive) heart failure: Secondary | ICD-10-CM

## 2010-09-19 DIAGNOSIS — N259 Disorder resulting from impaired renal tubular function, unspecified: Secondary | ICD-10-CM

## 2010-09-19 DIAGNOSIS — I5030 Unspecified diastolic (congestive) heart failure: Secondary | ICD-10-CM

## 2010-09-19 DIAGNOSIS — I509 Heart failure, unspecified: Secondary | ICD-10-CM | POA: Diagnosis present

## 2010-09-19 LAB — COMPREHENSIVE METABOLIC PANEL
AST: 17 U/L (ref 0–37)
Albumin: 3.5 g/dL (ref 3.5–5.2)
Alkaline Phosphatase: 127 U/L — ABNORMAL HIGH (ref 39–117)
BUN: 44 mg/dL — ABNORMAL HIGH (ref 6–23)
Chloride: 94 mEq/L — ABNORMAL LOW (ref 96–112)
Potassium: 4.1 mEq/L (ref 3.5–5.1)
Total Bilirubin: 0.6 mg/dL (ref 0.3–1.2)
Total Protein: 6.4 g/dL (ref 6.0–8.3)

## 2010-09-19 LAB — CK TOTAL AND CKMB (NOT AT ARMC): Relative Index: INVALID (ref 0.0–2.5)

## 2010-09-19 LAB — DIFFERENTIAL
Eosinophils Absolute: 0.1 10*3/uL (ref 0.0–0.7)
Lymphocytes Relative: 20 % (ref 12–46)
Lymphs Abs: 1.2 10*3/uL (ref 0.7–4.0)
Monocytes Relative: 7 % (ref 3–12)
Neutro Abs: 4.2 10*3/uL (ref 1.7–7.7)
Neutrophils Relative %: 71 % (ref 43–77)

## 2010-09-19 LAB — CBC
HCT: 30.4 % — ABNORMAL LOW (ref 36.0–46.0)
Hemoglobin: 10 g/dL — ABNORMAL LOW (ref 12.0–15.0)
MCH: 27.2 pg (ref 26.0–34.0)
MCV: 82.8 fL (ref 78.0–100.0)
Platelets: 182 10*3/uL (ref 150–400)
RBC: 3.67 MIL/uL — ABNORMAL LOW (ref 3.87–5.11)
WBC: 5.9 10*3/uL (ref 4.0–10.5)

## 2010-09-19 LAB — MAGNESIUM: Magnesium: 2.2 mg/dL (ref 1.5–2.5)

## 2010-09-19 LAB — TROPONIN I: Troponin I: <0.3 ng/mL (ref <0.30)

## 2010-09-19 LAB — PRO B NATRIURETIC PEPTIDE: Pro B Natriuretic peptide (BNP): 5646 pg/mL — ABNORMAL HIGH (ref 0–450)

## 2010-09-19 LAB — GLUCOSE, CAPILLARY: Glucose-Capillary: 118 mg/dL — ABNORMAL HIGH (ref 70–99)

## 2010-09-19 NOTE — Progress Notes (Signed)
Donna Reese, a 77 y.o. female presents today in the office for the following:    77 year old female, very well known to me, status post hospital discharge on 08/01/2010. She was admitted with a congestive heart failure exacerbation, worsening shortness or breath and failure to improve with outpatient management. Here again today, with worsening edema and SOB. SOB at baseline with Class IV CHF symptoms, o2 dependent COPD, pulmonary edema, 3-4+ pitting LE edema and edema in thigh. Also with noteable worsening renal function over the last 2 months with increased diuretic use.   Basic Metabolic Panel:    Component Value Date/Time   NA 137 09/06/2010 1102   K 3.9 09/06/2010 1102   CL 89* 09/06/2010 1102   CO2 30 09/06/2010 1102   BUN 65* 09/06/2010 1102   CREATININE 2.3* 09/06/2010 1102   GLUCOSE 125* 09/06/2010 1102   CALCIUM 8.4 09/06/2010 1102     The patient also has significant COPD, O2 dependent. Significant history for renal insufficiency which has been worsening in the acute setting over the last month, and additionally she has multiple medical problems noted below. At baseline, a month or so ago, the patient had only mild renal insufficiency. Within the last 2 weeks, her creatinine and BUN had increased. Of note, her diuretic regimen has been altered, and now she is taking torsemide 20 mg p.o. B.i.d (down from 40 mg bid)., and taking Zaroxolyn 5 mg every other day. Her weight is up 20 pounds. She believes that she is also taking 80 mg of lasix bid -- but meds not available.  Currently, she states she is having shortness of breath at rest. She does not feel like the manipulation of her diuretics has made any difference in regards to her shortness of breath.   Compliant with Spiriva and oxygen. Recently started on dulera.  She is not eating or drinking that well, and she states she does not feel well.   Patient Active Problem List  Diagnoses  . DIABETES MELLITUS, TYPE II  . HYPERLIPIDEMIA  .  GOUT, ACUTE  . DEPRESSION  . MIGRAINE WITHOUT AURA  . POLYNEUROPATHY IN DIABETES  . HYPERTENSION  . CAD, AUTOLOGOUS BYPASS GRAFT  . Atrial fibrillation  . CHRONIC SYSTOLIC HEART FAILURE  . RENAL ARTERY STENOSIS  . PERIPHERAL VASCULAR DISEASE  . COPD  . PEPTIC ULCER DISEASE  . RENAL INSUFFICIENCY  . DEGENERATIVE DISC DISEASE  . OSTEOPENIA  . Personal History of Venous Thrombosis and Embolism  . SOB (shortness of breath)  . Peripheral edema  . Diastolic heart failure  . Pleural effusion   Past Medical History  Diagnosis Date  . Diabetes mellitus   . COPD (chronic obstructive pulmonary disease)   . Diverticulosis   . Osteopenia   . PUD (peptic ulcer disease)   . Peripheral arterial disease   . Hypertension   . Hyperlipidemia   . Diastolic heart failure     a. echo 9/11: EF 60-65%, mild LVH, grade 2 diast dysfxn, AV mean gradient 9 mmHg, mild MR, RVE, RVSP 59  . Coronary artery disease     a. s/p CABG;  b. cath 8/09: LM occluded; RCA 50%; L-LAD ok, Radial-OM ok; bilat renal art stenosis  . Renovascular hypertension   . Renal artery stenosis     a. s/p L RA stent 8/09  . Paroxysmal a-fib     no coumadin 2/2 GI Blee  . DVT (deep venous thrombosis)   . Pulmonary embolus  s/p IVC filter  . History of gastrointestinal bleeding     no ASA or coumadin  . Contrast dye induced nephropathy   . Orthostatic hypotension    Past Surgical History  Procedure Date  . Appendectomy   . Cholecystectomy   . Abdominal hysterectomy   . Coronary artery bypass graft 09-04-2001    carotid u/s unchnaged 40-50% ICA stenosis 12/1998  . Cataract extraction, bilateral 2001  . Abdominal aortic aneurysm repair 1997  . Nm myoview ltd     neg EF 76%--06/26/2004  . Carotid doppler     12/05 unchanged 9-06  . Cardiac catheterization 09-03-01  . Esophagogastroduodenoscopy     abnormal 2007, esophageal stricture 10-12-03-07 gastritis and major bleef  . Colonoscopy     abnormal 03-13-06  . Vena  cava filter placement 07-2007    Dr. Excell Seltzer  . Renal artery doppler 11-17-2008    severe iliac stenosis   History  Substance Use Topics  . Smoking status: Former Smoker -- 1.0 packs/day for 40 years    Types: Cigarettes    Quit date: 04/01/1990  . Smokeless tobacco: Not on file  . Alcohol Use: No   Family History  Problem Relation Age of Onset  . Cancer Mother   . Stroke Father   . Stroke Brother   . Heart attack Brother   . Heart attack Brother   . Heart attack Brother    Allergies  Allergen Reactions  . Codeine   . Meperidine Hcl   . Morphine   . Niacin     REACTION: rash  . Penicillins   . Propoxyphene Hcl   . Sulfonamide Derivatives    Current Outpatient Prescriptions on File Prior to Visit  Medication Sig Dispense Refill  . amLODipine (NORVASC) 10 MG tablet Take 10 mg by mouth daily.        Marland Kitchen ezetimibe-simvastatin (VYTORIN) 10-20 MG per tablet Take 1 tablet by mouth at bedtime.        . ferrous sulfate 325 (65 FE) MG tablet Take 325 mg by mouth daily with breakfast.        . isosorbide mononitrate (IMDUR) 30 MG 24 hr tablet Take 1 tablet (30 mg total) by mouth daily.  30 tablet  11  . ketorolac (ACULAR) 0.4 % SOLN Place 1 drop into the right eye 4 (four) times daily.        Marland Kitchen latanoprost (XALATAN) 0.005 % ophthalmic solution Place 1 drop into both eyes daily.        Marland Kitchen losartan (COZAAR) 100 MG tablet Take 1 tablet (100 mg total) by mouth daily.  30 tablet  11  . metolazone (ZAROXOLYN) 5 MG tablet Use as directed, 30 minutes before AM Torsemide -- ONLY IF YOU HAVE A 3 POUND WEIGHT GAIN  30 tablet  2  . metoprolol (TOPROL-XL) 50 MG 24 hr tablet Take 50 mg by mouth daily.        . mirtazapine (REMERON) 15 MG tablet TAKE 1 TABLET BY MOUTH AT BEDTIME  30 tablet  5  . Mometasone Furo-Formoterol Fum (DULERA) 200-5 MCG/ACT AERO Inhale 2 puffs into the lungs 2 (two) times daily.  1 Inhaler  11  . nitroGLYCERIN (NITROSTAT) 0.4 MG SL tablet Place 0.4 mg under the tongue every 5  (five) minutes as needed.        . NON FORMULARY 2 L/min by Nasal route continuous. oxygen       . omeprazole (PRILOSEC) 40 MG capsule Take 40 mg  by mouth daily.        . potassium chloride (KLOR-CON) 10 MEQ CR tablet Take 10 mEq by mouth daily.        Marland Kitchen tiotropium (SPIRIVA) 18 MCG inhalation capsule Place 18 mcg into inhaler and inhale daily.        Marland Kitchen torsemide (DEMADEX) 20 MG tablet Take 1 tablet (20 mg total) by mouth 2 (two) times daily.  40 tablet  0  . triamcinolone (KENALOG) 0.1 % cream Apply topically 2 (two) times daily.  454 g  1  . OXYCODONE HCL PO Take 5 mg by mouth daily. As needed for pain        ROS: GEN: acutely SOB, worse than baseline. GI: No n/v/d, decreased PO Pulm: worsened SOB, DOE CV: occ Chest pain, worsened significantly LE Edema No falls Using walker No fevers or chills Otherwise, the pertinent positives and negatives are listed above and in the HPI, otherwise a full review of systems has been reviewed and is negative unless noted positive.  Physical Exam  Blood pressure 130/60, pulse 62, temperature 98.4 F (36.9 C), temperature source Oral, weight 185 lb 1.9 oz (83.97 kg), SpO2 98.00%.   GEN: WDWN, NAD, no acute distress.  HEENT: Atraumatic, Normocephalic. Neck supple. No masses, No LAD. Ears and Nose: No external deformity. CV: 3/6 SEM, irreg irreg PULM: CTA B, crackles at lung bases No retractions. No resp. distress. No accessory muscle use. ABD: S, NT, ND, +BS. No rebound tenderness. No HSM.  EXTR: 3+ edema, LE. Minimal thigh edema. All worsened NEURO Normal gait.  PSYCH: Normally interactive. Conversant. Not depressed or anxious appearing.  Calm demeanor.   A/P: 1. Acute heart failure: weight up 20 pounds despite aggressive diuretic use.  2. Pulmonary edema on exam. 3. Acute on chronic ARI.  4. COPD. 02 dependent. 5. AF, not coumadin candidate. GI bleeds  I spoke to the Cardiology Heart Master and arranged for direct admission to telemetry  service. Attending = Dr. Antoine Poche

## 2010-09-20 DIAGNOSIS — I5043 Acute on chronic combined systolic (congestive) and diastolic (congestive) heart failure: Secondary | ICD-10-CM

## 2010-09-20 LAB — GLUCOSE, CAPILLARY
Glucose-Capillary: 114 mg/dL — ABNORMAL HIGH (ref 70–99)
Glucose-Capillary: 110 mg/dL — ABNORMAL HIGH (ref 70–99)

## 2010-09-20 LAB — BASIC METABOLIC PANEL
BUN: 44 mg/dL — ABNORMAL HIGH (ref 6–23)
CO2: 35 mEq/L — ABNORMAL HIGH (ref 19–32)
Creatinine, Ser: 1.49 mg/dL — ABNORMAL HIGH (ref 0.50–1.10)
GFR calc Af Amer: 41 mL/min — ABNORMAL LOW (ref >60)
Potassium: 3.8 mEq/L (ref 3.5–5.1)
Sodium: 137 mEq/L (ref 135–145)

## 2010-09-21 ENCOUNTER — Ambulatory Visit: Payer: Medicare Other | Admitting: Emergency Medicine

## 2010-09-21 ENCOUNTER — Telehealth: Payer: Self-pay | Admitting: Internal Medicine

## 2010-09-21 LAB — BASIC METABOLIC PANEL
CO2: 36 mEq/L — ABNORMAL HIGH (ref 19–32)
Calcium: 8.5 mg/dL (ref 8.4–10.5)
GFR calc Af Amer: 40 mL/min — ABNORMAL LOW (ref >60)
GFR calc non Af Amer: 33 mL/min — ABNORMAL LOW (ref >60)
Sodium: 132 mEq/L — ABNORMAL LOW (ref 135–145)

## 2010-09-21 LAB — GLUCOSE, CAPILLARY: Glucose-Capillary: 118 mg/dL — ABNORMAL HIGH (ref 70–99)

## 2010-09-21 NOTE — Telephone Encounter (Signed)
Error Wrong pt.

## 2010-09-26 ENCOUNTER — Other Ambulatory Visit (INDEPENDENT_AMBULATORY_CARE_PROVIDER_SITE_OTHER): Payer: Medicare Other | Admitting: *Deleted

## 2010-09-26 ENCOUNTER — Encounter: Payer: Self-pay | Admitting: Physician Assistant

## 2010-09-26 ENCOUNTER — Ambulatory Visit (INDEPENDENT_AMBULATORY_CARE_PROVIDER_SITE_OTHER): Payer: Medicare Other | Admitting: Physician Assistant

## 2010-09-26 VITALS — BP 138/44 | HR 80 | Ht 63.0 in | Wt 183.8 lb

## 2010-09-26 DIAGNOSIS — I10 Essential (primary) hypertension: Secondary | ICD-10-CM

## 2010-09-26 DIAGNOSIS — N259 Disorder resulting from impaired renal tubular function, unspecified: Secondary | ICD-10-CM

## 2010-09-26 DIAGNOSIS — I4891 Unspecified atrial fibrillation: Secondary | ICD-10-CM

## 2010-09-26 DIAGNOSIS — I509 Heart failure, unspecified: Secondary | ICD-10-CM

## 2010-09-26 DIAGNOSIS — R0600 Dyspnea, unspecified: Secondary | ICD-10-CM

## 2010-09-26 DIAGNOSIS — I25810 Atherosclerosis of coronary artery bypass graft(s) without angina pectoris: Secondary | ICD-10-CM

## 2010-09-26 DIAGNOSIS — R0989 Other specified symptoms and signs involving the circulatory and respiratory systems: Secondary | ICD-10-CM

## 2010-09-26 DIAGNOSIS — I5032 Chronic diastolic (congestive) heart failure: Secondary | ICD-10-CM

## 2010-09-26 DIAGNOSIS — R0609 Other forms of dyspnea: Secondary | ICD-10-CM

## 2010-09-26 MED ORDER — METOLAZONE 5 MG PO TABS: ORAL_TABLET | ORAL | Status: DC

## 2010-09-26 NOTE — Patient Instructions (Signed)
Your physician recommends that you schedule a follow-up appointment in: 2 weeks with Dr Excell Seltzer or Tereso Newcomer, [facility]  Your physician has recommended you make the following change in your medication: Change Metolazone to every Mon, Wed, Fri, Sat  Your physician recommends that you return for lab work in: today BMP & BNP return in 1 week for an additional BMP  Start wearing the compression knee highs daily

## 2010-09-26 NOTE — Assessment & Plan Note (Signed)
Stable.  No angina  

## 2010-09-26 NOTE — Assessment & Plan Note (Signed)
Controlled.  Continue current therapy.  

## 2010-09-26 NOTE — Assessment & Plan Note (Signed)
Keep a close eye on her renal function and potassium with a basic metabolic panel today and again in one week.

## 2010-09-26 NOTE — Assessment & Plan Note (Signed)
Rate control.  Not on Coumadin secondary to history of GI bleeding.

## 2010-09-26 NOTE — Assessment & Plan Note (Signed)
Her weight is up 5 pounds since discharge from the hospital.  However, she states that her weight at home has been stable at 180 pounds.  She still has edema.  Her neck veins are up.  I'm going to adjust her metolazone more by having her take it 4 days every week (on Mondays, Wednesdays, Fridays and Saturdays).  She will have a basic metabolic panel and BNP today.  She will follow up in 2 weeks with either Dr. Excell Seltzer or me.  Repeat basic metabolic panel in one week.  We will give her compression stocking prescription to see if this helps some of her lower extremity edema.  She knows to contact us if her symptoms are worsening.  We may need to discuss whether or not to send her to the CHF clinic for further management.

## 2010-09-26 NOTE — Progress Notes (Signed)
History of Present Illness: Primary Cardiologist: Dr. Tonny Bollman  Donna Reese is a 77 y.o. female with a history of CAD, status post CABG with patent grafts by cath in 8/09, renovascular hypertension, status post left renal artery stenting 8/09, diastolic heart failure, atrial fibrillation, status post IVC filter in the setting of DVT and pulmonary embolism who is not a Coumadin or aspirin candidate secondary to GI bleeding.  She was admitted in April for acute on chronic diastolic heart failure.  Her BNP was not actually that high but she did improve with diuresis.  Her Lasix was changed to torsemide.  She was asked to take metolazone as needed.  Her creatinine did increase.  She did have a small right pleural effusion noted on chest x-ray 5/1.  She is on chronic O2 with her COPD.  She was admitted again 6/20-6/22 with acute on chronic diastolic heart failure.  She presented with a 20 pound weight gain and increased edema.  Her PCP had adjusted her metolazone as an outpatient.  This was unsuccessful.  Her chest x-ray was notable for congestive heart failure.  Her BNP was 5942.  She responded to IV Lasix and her discharge weight was 178 pounds.  She returns for follow up today.  She notes that her lower extremity edema is worse when she is on her feet.  This is unchanged since her hospitalization.  She denies any change in her shortness of breath.  She still is class III.  She sleeps on a couple of pillows.  This is unchanged.  She denies PND.  She denies syncope.  She has occasional, atypical left-sided chest pains.  These are fairly chronic without significant change.  Labs: Potassium 3.4, creatinine 1.52, hemoglobin 10, ALT 11, TSH 2.164  Past Medical History  Diagnosis Date  . Diabetes mellitus   . COPD (chronic obstructive pulmonary disease)   . Diverticulosis   . Osteopenia   . PUD (peptic ulcer disease)   . Peripheral arterial disease   . Hypertension   . Hyperlipidemia   . Diastolic  heart failure     a. echo 9/11: EF 60-65%, mild LVH, grade 2 diast dysfxn, AV mean gradient 9 mmHg, mild MR, RVE, RVSP 59  . Coronary artery disease     a. s/p CABG;  b. cath 8/09: LM occluded; RCA 50%; L-LAD ok, Radial-OM ok; bilat renal art stenosis  . Renovascular hypertension   . Renal artery stenosis     a. s/p L RA stent 8/09  . Paroxysmal a-fib     no coumadin 2/2 GI Blee  . DVT (deep venous thrombosis)   . Pulmonary embolus     s/p IVC filter  . History of gastrointestinal bleeding     no ASA or coumadin  . Contrast dye induced nephropathy   . Orthostatic hypotension     Current Outpatient Prescriptions  Medication Sig Dispense Refill  . amLODipine (NORVASC) 10 MG tablet Take 10 mg by mouth daily.        Marland Kitchen ezetimibe-simvastatin (VYTORIN) 10-20 MG per tablet Take 1 tablet by mouth at bedtime.        . ferrous sulfate 325 (65 FE) MG tablet Take 325 mg by mouth daily with breakfast.        . hydrALAZINE (APRESOLINE) 10 MG tablet Take 10 mg by mouth 3 (three) times daily.        . isosorbide mononitrate (IMDUR) 30 MG 24 hr tablet Take 1 tablet (30 mg total)  by mouth daily.  30 tablet  11  . ketorolac (ACULAR) 0.4 % SOLN Place 1 drop into the right eye 4 (four) times daily.        Marland Kitchen latanoprost (XALATAN) 0.005 % ophthalmic solution Place 1 drop into both eyes daily.        Marland Kitchen losartan (COZAAR) 100 MG tablet Take 1 tablet (100 mg total) by mouth daily.  30 tablet  11  . metolazone (ZAROXOLYN) 5 MG tablet Use as directed, 30 minutes before AM Torsemide -- ONLY IF YOU HAVE A 3 POUND WEIGHT GAIN  30 tablet  2  . metoprolol (TOPROL-XL) 50 MG 24 hr tablet Take 25 mg by mouth daily.       . mirtazapine (REMERON) 15 MG tablet TAKE 1 TABLET BY MOUTH AT BEDTIME  30 tablet  5  . Mometasone Furo-Formoterol Fum (DULERA) 200-5 MCG/ACT AERO Inhale 2 puffs into the lungs 2 (two) times daily.  1 Inhaler  11  . nitroGLYCERIN (NITROSTAT) 0.4 MG SL tablet Place 0.4 mg under the tongue every 5 (five)  minutes as needed.        . NON FORMULARY 2 L/min by Nasal route continuous. oxygen       . omeprazole (PRILOSEC) 40 MG capsule Take 40 mg by mouth daily.        . OXYCODONE HCL PO Take 5 mg by mouth daily. As needed for pain       . potassium chloride (KLOR-CON) 10 MEQ CR tablet Take 10 mEq by mouth daily.        Marland Kitchen tiotropium (SPIRIVA) 18 MCG inhalation capsule Place 18 mcg into inhaler and inhale daily.        Marland Kitchen torsemide (DEMADEX) 20 MG tablet Take 40 mg by mouth 2 (two) times daily.        Marland Kitchen triamcinolone (KENALOG) 0.1 % cream Apply topically 2 (two) times daily.  454 g  1  . DISCONTD: torsemide (DEMADEX) 20 MG tablet Take 1 tablet (20 mg total) by mouth 2 (two) times daily.  40 tablet  0    Allergies: Allergies  Allergen Reactions  . Codeine   . Meperidine Hcl   . Morphine   . Niacin     REACTION: rash  . Penicillins   . Propoxyphene Hcl   . Sulfonamide Derivatives     Vital Signs: BP 138/44  Pulse 80  Ht 5\' 3"  (1.6 m)  Wt 183 lb 12.8 oz (83.371 kg)  BMI 32.56 kg/m2  PHYSICAL EXAM: Well nourished, well developed, in no acute distress HEENT: normal Neck: Positive JVD Cardiac:  normal S1, S2; Irregularly irregular; 2/6 systolic ejection murmur along the left sternal border Lungs:  clear to auscultation bilaterally, no wheezing, rhonchi or rales Abd: soft, nontender, no hepatomegaly Ext: 1+ nonpitting edema Skin: warm and dry Neuro:  CNs 2-12 intact, no focal abnormalities noted  EKG:  Atrial fibrillation, heart rate 80, nonspecific ST-T wave changes  ASSESSMENT AND PLAN:

## 2010-09-27 ENCOUNTER — Encounter: Payer: Self-pay | Admitting: Family Medicine

## 2010-09-27 ENCOUNTER — Ambulatory Visit (INDEPENDENT_AMBULATORY_CARE_PROVIDER_SITE_OTHER): Payer: Medicare Other | Admitting: Family Medicine

## 2010-09-27 DIAGNOSIS — J449 Chronic obstructive pulmonary disease, unspecified: Secondary | ICD-10-CM

## 2010-09-27 DIAGNOSIS — I5030 Unspecified diastolic (congestive) heart failure: Secondary | ICD-10-CM

## 2010-09-27 DIAGNOSIS — I5022 Chronic systolic (congestive) heart failure: Secondary | ICD-10-CM

## 2010-09-27 LAB — BASIC METABOLIC PANEL
Calcium: 8.6 mg/dL (ref 8.4–10.5)
Creatinine, Ser: 1.6 mg/dL — ABNORMAL HIGH (ref 0.4–1.2)
GFR: 33.44 mL/min — ABNORMAL LOW (ref >60.00)

## 2010-09-27 MED ORDER — TORSEMIDE 20 MG PO TABS: 40.0000 mg | ORAL_TABLET | Freq: Two times a day (BID) | ORAL | Status: DC

## 2010-09-27 NOTE — Patient Instructions (Signed)
Come back for blood draw in 1 week  F/u with me in 1 month

## 2010-09-27 NOTE — Progress Notes (Signed)
Donna Reese, a 77 y.o. female presents today in the office for the following:    Hosp Admit 09/19/2010 for CHF exac, BNP > 5000.  Complex patient with multiple medical comorbidities including significant diastolic heart failure, oxygen-dependent chronic obstructive pulmonary disease, chronic renal insufficiency, generalized weakness, coronary disease, atrial fibrillation, history of pulmonary emboli status post IVC filter placement, and multiple others detailed below.  Of note,  I spent a significant amount of time reviewing every bottle of medication that the patient had with her. She seems to not have any loop diuretics, and appears to have been confused about taking torsemide, and she certainly does not have any currently. She has not taken any Lasix since leaving the hospital either. Her weight is down several pounds compared to prior hospitalization, but up 15 pounds compared to prior office visit in May.  Some days worse than others Short of breath sitting in the office.  Short of breath laying down 2 pillow orthopnea.  The cardiology service increased her torsemide again to 40 mg p.o. B.i.d. Additionally, they increased her metolazone dosing slightly.  Basic Metabolic Panel:    Component Value Date/Time   NA 134* 09/26/2010 1624   K 4.1 09/26/2010 1624   CL 95* 09/26/2010 1624   CO2 29 09/26/2010 1624   BUN 38* 09/26/2010 1624   CREATININE 1.6* 09/26/2010 1624   GLUCOSE 100* 09/26/2010 1624   CALCIUM 8.6 09/26/2010 1624    chronic obstructive pulmonary disease: She reports compliance taking her Spiriva every day, we reviewed again the importance of her using her Dulera, which she recently restarted again.  Pleural effusion: She had sat had a persistent right-sided pleural effusion, and we obtained a CT of the chest without contrast given renal function, to evaluate this, and this all occurred prior to her hospitalization, but it was reassuring.  Patient Active Problem List    Diagnoses  . DIABETES MELLITUS, TYPE II  . HYPERLIPIDEMIA  . GOUT, ACUTE  . DEPRESSION  . MIGRAINE WITHOUT AURA  . POLYNEUROPATHY IN DIABETES  . HYPERTENSION  . CAD, AUTOLOGOUS BYPASS GRAFT  . Atrial fibrillation  . CHRONIC SYSTOLIC HEART FAILURE  . RENAL ARTERY STENOSIS  . PERIPHERAL VASCULAR DISEASE  . COPD  . PEPTIC ULCER DISEASE  . RENAL INSUFFICIENCY  . DEGENERATIVE DISC DISEASE  . OSTEOPENIA  . Personal History of Venous Thrombosis and Embolism  . SOB (shortness of breath)  . Peripheral edema  . Diastolic heart failure   Past Medical History  Diagnosis Date  . Diabetes mellitus   . COPD (chronic obstructive pulmonary disease)   . Diverticulosis   . Osteopenia   . PUD (peptic ulcer disease)   . Peripheral arterial disease   . Hypertension   . Hyperlipidemia   . Diastolic heart failure     a. echo 9/11: EF 60-65%, mild LVH, grade 2 diast dysfxn, AV mean gradient 9 mmHg, mild MR, RVE, RVSP 59  . Coronary artery disease     a. s/p CABG;  b. cath 8/09: LM occluded; RCA 50%; L-LAD ok, Radial-OM ok; bilat renal art stenosis  . Renovascular hypertension   . Renal artery stenosis     a. s/p L RA stent 8/09  . Paroxysmal a-fib     no coumadin 2/2 GI Blee  . DVT (deep venous thrombosis)   . Pulmonary embolus     s/p IVC filter  . History of gastrointestinal bleeding     no ASA or coumadin  . Contrast  dye induced nephropathy   . Orthostatic hypotension    Past Surgical History  Procedure Date  . Appendectomy   . Cholecystectomy   . Abdominal hysterectomy   . Coronary artery bypass graft 09-04-2001    carotid u/s unchnaged 40-50% ICA stenosis 12/1998  . Cataract extraction, bilateral 2001  . Abdominal aortic aneurysm repair 1997  . Nm myoview ltd     neg EF 76%--06/26/2004  . Carotid doppler     12/05 unchanged 9-06  . Cardiac catheterization 09-03-01  . Esophagogastroduodenoscopy     abnormal 2007, esophageal stricture 10-12-03-07 gastritis and major bleef  .  Colonoscopy     abnormal 03-13-06  . Vena cava filter placement 07-2007    Dr. Excell Seltzer  . Renal artery doppler 11-17-2008    severe iliac stenosis   History  Substance Use Topics  . Smoking status: Former Smoker -- 1.0 packs/day for 40 years    Types: Cigarettes    Quit date: 04/01/1990  . Smokeless tobacco: Not on file  . Alcohol Use: No   Family History  Problem Relation Age of Onset  . Cancer Mother   . Stroke Father   . Stroke Brother   . Heart attack Brother   . Heart attack Brother   . Heart attack Brother    Allergies  Allergen Reactions  . Codeine   . Meperidine Hcl   . Morphine   . Niacin     REACTION: rash  . Penicillins   . Propoxyphene Hcl   . Sulfonamide Derivatives    Current Outpatient Prescriptions on File Prior to Visit  Medication Sig Dispense Refill  . amLODipine (NORVASC) 10 MG tablet Take 10 mg by mouth daily.        Marland Kitchen ezetimibe-simvastatin (VYTORIN) 10-20 MG per tablet Take 1 tablet by mouth at bedtime.        . ferrous sulfate 325 (65 FE) MG tablet Take 325 mg by mouth daily with breakfast.        . hydrALAZINE (APRESOLINE) 10 MG tablet Take 10 mg by mouth 3 (three) times daily.        . isosorbide mononitrate (IMDUR) 30 MG 24 hr tablet Take 1 tablet (30 mg total) by mouth daily.  30 tablet  11  . ketorolac (ACULAR) 0.4 % SOLN Place 1 drop into the right eye 4 (four) times daily.        Marland Kitchen latanoprost (XALATAN) 0.005 % ophthalmic solution Place 1 drop into both eyes daily.        Marland Kitchen losartan (COZAAR) 100 MG tablet Take 1 tablet (100 mg total) by mouth daily.  30 tablet  11  . metolazone (ZAROXOLYN) 5 MG tablet Take 1 tab on Mon, Wed, Fri, Sat  30 tablet  6  . metoprolol (TOPROL-XL) 50 MG 24 hr tablet Take 25 mg by mouth daily.       . mirtazapine (REMERON) 15 MG tablet TAKE 1 TABLET BY MOUTH AT BEDTIME  30 tablet  5  . Mometasone Furo-Formoterol Fum (DULERA) 200-5 MCG/ACT AERO Inhale 2 puffs into the lungs 2 (two) times daily.  1 Inhaler  11  .  nitroGLYCERIN (NITROSTAT) 0.4 MG SL tablet Place 0.4 mg under the tongue every 5 (five) minutes as needed.        . NON FORMULARY 2 L/min by Nasal route continuous. oxygen       . omeprazole (PRILOSEC) 40 MG capsule Take 40 mg by mouth daily.        Marland Kitchen  potassium chloride (KLOR-CON) 10 MEQ CR tablet Take 10 mEq by mouth daily.        Marland Kitchen tiotropium (SPIRIVA) 18 MCG inhalation capsule Place 18 mcg into inhaler and inhale daily.        Marland Kitchen triamcinolone (KENALOG) 0.1 % cream Apply topically 2 (two) times daily.  454 g  1  . OXYCODONE HCL PO Take 5 mg by mouth daily. As needed for pain        ROS: no fevers, chills, sweats. Shortness of breath at baseline. Occasional chest pain. Notable lower extremity edema. Overall, improved compared to prior office visit.   Physical Exam  Blood pressure 160/70, pulse 84, temperature 97.6 F (36.4 C), temperature source Oral, weight 183 lb 6.4 oz (83.19 kg), SpO2 94.00%.  GEN: WDWN, NAD, Non-toxic, A & O x 3 HEENT: Atraumatic, Normocephalic. Neck supple. No masses, No LAD. Ears and Nose: No external deformity. CV: irreg, irreg. 3/6 SEM. + JVD to neck PULM: decreased BS, but no crackles or wheezes ABD: S, NT, ND, +BS. No rebound tenderness. No HSM.  EXTR: 2+ LE edema NEURO Normal gait.  PSYCH: Normally interactive. Conversant. Not depressed or anxious appearing.  Calm demeanor.   Assessment and Plan: 1.  CHF: If she truly has not been taking her torsemide, which we had thought that she had been taking all along, and she had thought she had been taking all along, then that certainly would explain her worsening swelling. I placed her back on 40 mg of torsemide b.i.d., and the patient has a followup with Dr. Excell Seltzer on October 02, 2010. Beyond doing all of these things, I appreciate cardiology assistance in the care of this patient.  Blood pressure is up slightly today, but I think can be reassessed once the patient is back on her diuretics at her cardiology followup  next week.  2. Chronic obstructive pulmonary disease: Recently resumed Dulera, on Spiriva, and is on oxygen. Significant rapid desaturation on last recheck for oxygen determination. The patient is going to followup with Dr. Delton Coombes in about 2 weeks. Any other suggestions by him for maximizing her pulmonary function would be greatly appreciated.

## 2010-10-01 NOTE — H&P (Signed)
NAMEFENIX, RUPPE NO.:  [id number]  MEDICAL RECORD NO.:  [id number]  LOCATION:  4736                         FACILITY:  [facility]  PHYSICIAN:  Rollene Rotunda, MD, FACCDATE OF BIRTH:  [birth year]  DATE OF ADMISSION:  09/19/2010 DATE OF DISCHARGE:                             HISTORY & PHYSICAL   PRIMARY CARDIOLOGIST:  Veverly Fells. Excell Seltzer, MD  PRIMARY CARE PROVIDER:  Juleen China, MD  PATIENT PROFILE:  A 77 year old female with prior history of diastolic congestive heart failure who presents to the ED with about a 4-month history of progressive lower extremity edema and 20-pound weight gain, unresponsive to outpatient diuretics. 1. Acute-on-chronic diastolic congestive heart failure.     a.     On December 15, 2009, a 2-D echocardiogram, EF 60-65% with      grade 2 diastolic dysfunction.  Mild mitral regurgitation.  Normal      RV function with an RV systolic pressure of 59.  PASP is 59 mmHg.      Mildly dilated right atrium. 2. History of atrial fibrillation.     a.     The patient is not on Coumadin secondary to known prior      history of GI bleed with small bowel AVM. 3. History of DVT and PE.     a.     Status post IVC filter in 2009. 4. Peripheral arterial disease.     a.     Status post left renal artery stenting in August 2009. 5. Coronary artery disease.     a.     Status post CABG x2 with LIMA to the LAD and left radial      artery to the obtuse marginal.     b.     Catheterization in August 2009 showing patent grafts and      total occlusion of the right coronary artery. 6. Hypertension. 7. History of abdominal aortic aneurysm repair in 1997. 8. Stage III chronic kidney disease with history of contrast-induced     nephropathy. 9. Diabetes mellitus - diet controlled. 10.Hyperlipidemia. 11.COPD, on home O2. 12.Peptic ulcer disease. 13.Diverticulosis. 14.Osteopenia. 15.History of orthostatic hypotension.  ALLERGIES:  IV CONTRAST has  caused nephropathy.  She is allergic to PENICILLIN, SULFA, NIASPAN, CODEINE, MORPHINE, DEMEROL, and DARVON.  HISTORY OF PRESENT ILLNESS:  A 77 year old female with the above complex problems.  Over the past month, she has noted significant increase in lower extremity edema, some early satiety, weight gain (20 pounds) as well as dyspnea on exertion and slight worsening of chronic 2-pillow orthopnea.  She has been followed closely by primary care provider with outpatient diuretic adjusted and Zaroxolyn added on an every other day basis.  However, fortunately, her edema has progressed.  According to the patient's family, primary care provider recommended admission earlier this month, the patient refused.  She saw him today and this time because of progressive symptoms was willing to come to the ED.  The patient has had episodic chest discomfort described as a sharp and fleeting chest pain different from prior angina.  HOME MEDICATIONS: 1. Amlodipine 10 mg daily. 2. Vytorin 10/20 mg nightly. 3. Ferrous sulfate 325 mg daily.  4. Imdur 30 mg daily. 5. Acular 0.4% one drop right eye 4 times per day. 6. Xalatan 0.005% one drop both eyes daily. 7. Cozaar 1 mg daily. 8. Metolazone 5 mg every other day for weight gain. 9. Toprol-XL 50 mg daily. 10.Remeron 15 mg nightly. 11.Dulera 200-5 mcg 2 puffs b.i.d. 12.Nitroglycerin 0.4 mg p.r.n. chest pain. 13.O2 two liters per minute. 14.Prilosec 40 mg daily. 15.Potassium chloride 10 mEq daily. 16.Spiriva 18 mcg daily. 17.Torsemide 20 mg b.i.d. 18.Kenalog 0.1% cream applied topically b.i.d. 19.Oxycodone hydrochloride 5 mg daily p.r.n. 20.Lasix 80 mg b.i.d.  FAMILY HISTORY:  Mother died with history of cancer.  Father died of CVA.  SOCIAL HISTORY:  The patient lives in with her husband.  She does not work.  She previously smoked heavily, but quit in 1980.  She denies alcohol or drug use.  Does not routinely exercise.  REVIEW OF SYSTEMS:   Positive for chest pain, described as sharp and fleeting.  She has had chronic dyspnea on exertion which is slightly worse the past month.  The patient has chronic 2-pillow orthopnea, again which is slightly worse.  She has had increasing lower extremity edema. She notes urinary infrequency.  She has an occasional dark stool with prior history of GI bleeding.  She is a diabetic, diet controlled.  She is a full code.  Otherwise, all systems reviewed and negative.  PHYSICAL EXAMINATION:  VITAL SIGNS:  Temperature 98.3, heart rate 62, respirations 19, blood pressure 156/50, pulse ox is 98% on 2 liters. GENERAL:  Pleasant white female, in no acute distress.  Awake, alert, oriented x3.  She has normal affect. HEENT:  Normal. NEUROLOGIC:  Grossly intact, nonfocal. SKIN:  Warm and dry with multiple punctate erythematous areas measuring less than 1 cm along her lower extremities, appears somewhat of bug bite, but she denies itching or pain. NECK:  Supple with JVP to the ear. LUNGS:  Respirations are regular and unlabored.  Clear to auscultation. CARDIAC:  Regular S1 and S2 with 2/6 systolic ejection murmur at the bilateral upper sternal borders. ABDOMEN:  Soft, nontender, nondistended.  Bowel sounds present. EXTREMITIES:  Warm, dry, pink.  No clubbing or cyanosis, 3+ bilateral lower extremity edema to the knees.  Chest x-ray and EKG is currently pending.  Hemoglobin 10.0, hematocrit 30.4, WBC 5.9, platelets 182.  Sodium 137, potassium 4.1, chloride 94, CO2 of 32, BUN 44, creatinine 1.58, glucose 106, total bilirubin 8.6, alkaline phosphatase 127, AST 17, ALT 11, total protein 6.4, albumin 3.5.  BNP 5646, CK 63, MB 1.9, troponin-I less than 0.30.  ASSESSMENT AND PLAN: 1. Acute-on-chronic diastolic congestive heart failure.  The patient     presents with a 45-month history of progressive lower extremity     edema, dyspnea, early satiety, and 20-pound weight gain.  We will     admit for IV  diuresis.  Continue blood pressure and heart rate     management with her home meds.  Exam findings suggest right-sided     heart failure and we have noted that prior echo September 2011     shows normal RV systolic function with both an right ventricular     systolic pressure and pulmonary artery systolic pressure of 59     mmHg.  The patient does have prior history of pulmonary embolus,     status post IVC filter.  She has not previously been a Coumadin     candidate secondary to chronic GI blood loss with arteriovenous  malformations.  There is question as to whether or not chronic     pulmonary emboli are contributing to heart failure and subsequent     edema. 2. Coronary artery disease.  The patient had intermittent fleeting     chest pain, though she says this is nothing new and not similar to     prior angina.  We will continue her home regimen.  Her last     catheterization was in 2009 showing 2/2 patent grafts.  She has a     history of contrast nephropathy. 3. Hypertension.  Follow with diuresis. 4. Hyperlipidemia.  Continue statin therapy. 5. Chronic obstructive pulmonary disease.  Continue home O2. 6. Diabetes mellitus.  Add sliding scale insulin. 7. Stage III chronic kidney disease.  Follow renal function closely in     the setting of ongoing diuresis.     Nicolasa Ducking, ANP   ______________________________ Rollene Rotunda, MD, [facility]    CB/MEDQ  D:  09/19/2010  T:  09/20/2010  Job:  [id number]  Electronically Signed by Nicolasa Ducking ANP on 09/21/2010 02:35:23 PM Electronically Signed by Rollene Rotunda MD [facility] on 10/01/2010 02:27:10 PM

## 2010-10-01 NOTE — Progress Notes (Signed)
Ok Will see how she is doing tomorrow with Dr. Excell Seltzer Thank you

## 2010-10-02 ENCOUNTER — Encounter: Payer: Self-pay | Admitting: Cardiovascular Disease

## 2010-10-02 ENCOUNTER — Ambulatory Visit (INDEPENDENT_AMBULATORY_CARE_PROVIDER_SITE_OTHER): Payer: Medicare Other | Admitting: Cardiovascular Disease

## 2010-10-02 VITALS — BP 138/50 | HR 71 | Ht 63.0 in | Wt 173.0 lb

## 2010-10-02 DIAGNOSIS — I5032 Chronic diastolic (congestive) heart failure: Secondary | ICD-10-CM

## 2010-10-02 DIAGNOSIS — N259 Disorder resulting from impaired renal tubular function, unspecified: Secondary | ICD-10-CM

## 2010-10-02 DIAGNOSIS — I5030 Unspecified diastolic (congestive) heart failure: Secondary | ICD-10-CM

## 2010-10-02 DIAGNOSIS — I4891 Unspecified atrial fibrillation: Secondary | ICD-10-CM

## 2010-10-02 DIAGNOSIS — I509 Heart failure, unspecified: Secondary | ICD-10-CM

## 2010-10-02 NOTE — Patient Instructions (Signed)
Your physician recommends that you schedule a follow-up appointment in: 3-4 weeks with Tereso Newcomer, PA and next available with Dr. Excell Seltzer.

## 2010-10-02 NOTE — Assessment & Plan Note (Signed)
The patient has chronic diastolic heart failure. She is on a complex medical regimen and I think it is been difficult for her to adhere to her medications properly. However this seems to have been straightened out in the last week or 2 with the help of Dr. Patsy Lager. She has now been taking torsemide on a daily basis as well as metolazone which is taken 4 days per week. Her weight is down 10 pounds since her office visit last week. The patient has lab work scheduled for July 5 with Dr. Patsy Lager and I will review these labs as well. She should maintain on her current medications as I think her volume status is improved since her last visit and since her hospitalization.

## 2010-10-02 NOTE — Assessment & Plan Note (Signed)
Stable with controlled heart rate. The patient is not candidate for anticoagulation secondary to multiple recurrent GI bleeds.

## 2010-10-02 NOTE — Progress Notes (Signed)
HPI:  This is a 77 year old woman presenting for followup evaluation. She has multiple cardiovascular problems, but her biggest problem of late has been diastolic heart failure. She's had multiple hospital admissions and outpatient management has been very difficult. She was recently hospitalized June 20 through June 22 and treated with intravenous diuretics. She had a good response and was able to be discharged after about a 48 hour admission.  The patient presents today for followup evaluation. She is doing well on combination of torsemide and metolazone. Her weight is down about 10 pounds from her office visit last week. She continues to have dyspnea with exertion but denies orthopnea or PND. Her leg edema persists but is less than it has been previously. She denies chest pain or pressure. She denies lightheadedness or syncope. Her exertional dyspnea is significant and she has difficulty even walking from the car into our office. The patient is on home oxygen. She also has permanent atrial fibrillation, history of DVT not a candidate for warfarin, CAD status post CABG, and aortobifemoral bypass.  Outpatient Encounter Prescriptions as of 10/02/2010  Medication Sig Dispense Refill  . amLODipine (NORVASC) 10 MG tablet Take 10 mg by mouth daily.        Marland Kitchen ezetimibe-simvastatin (VYTORIN) 10-20 MG per tablet Take 1 tablet by mouth at bedtime.        . ferrous sulfate 325 (65 FE) MG tablet Take 325 mg by mouth daily with breakfast.        . hydrALAZINE (APRESOLINE) 10 MG tablet Take 10 mg by mouth 3 (three) times daily.        . isosorbide mononitrate (IMDUR) 30 MG 24 hr tablet Take 1 tablet (30 mg total) by mouth daily.  30 tablet  11  . ketorolac (ACULAR) 0.4 % SOLN Place 1 drop into the right eye 4 (four) times daily.        Marland Kitchen latanoprost (XALATAN) 0.005 % ophthalmic solution Place 1 drop into both eyes daily.        Marland Kitchen losartan (COZAAR) 100 MG tablet Take 1 tablet (100 mg total) by mouth daily.  30 tablet   11  . metolazone (ZAROXOLYN) 5 MG tablet Take 1 tab on Mon, Wed, Fri, Sat  30 tablet  6  . metoprolol (TOPROL-XL) 50 MG 24 hr tablet Take 25 mg by mouth daily.       . mirtazapine (REMERON) 15 MG tablet TAKE 1 TABLET BY MOUTH AT BEDTIME  30 tablet  5  . Mometasone Furo-Formoterol Fum (DULERA) 200-5 MCG/ACT AERO Inhale 2 puffs into the lungs 2 (two) times daily.  1 Inhaler  11  . nitroGLYCERIN (NITROSTAT) 0.4 MG SL tablet Place 0.4 mg under the tongue every 5 (five) minutes as needed.        . NON FORMULARY 2 L/min by Nasal route continuous. oxygen       . omeprazole (PRILOSEC) 40 MG capsule Take 40 mg by mouth daily.        . OXYCODONE HCL PO Take 5 mg by mouth daily. As needed for pain       . potassium chloride (KLOR-CON) 10 MEQ CR tablet Take 10 mEq by mouth daily.        Marland Kitchen tiotropium (SPIRIVA) 18 MCG inhalation capsule Place 18 mcg into inhaler and inhale daily.        Marland Kitchen torsemide (DEMADEX) 20 MG tablet Take 2 tablets (40 mg total) by mouth 2 (two) times daily.  120 tablet  2  .  DISCONTD: triamcinolone (KENALOG) 0.1 % cream Apply topically 2 (two) times daily.  454 g  1    Allergies  Allergen Reactions  . Codeine   . Meperidine Hcl   . Morphine   . Niacin     REACTION: rash  . Penicillins   . Propoxyphene Hcl   . Sulfonamide Derivatives     Past Medical History  Diagnosis Date  . Diabetes mellitus   . COPD (chronic obstructive pulmonary disease)   . Diverticulosis   . Osteopenia   . PUD (peptic ulcer disease)   . Peripheral arterial disease   . Hypertension   . Hyperlipidemia   . Diastolic heart failure     a. echo 9/11: EF 60-65%, mild LVH, grade 2 diast dysfxn, AV mean gradient 9 mmHg, mild MR, RVE, RVSP 59  . Coronary artery disease     a. s/p CABG;  b. cath 8/09: LM occluded; RCA 50%; L-LAD ok, Radial-OM ok; bilat renal art stenosis  . Renovascular hypertension   . Renal artery stenosis     a. s/p L RA stent 8/09  . Paroxysmal a-fib     no coumadin 2/2 GI Blee  .  DVT (deep venous thrombosis)   . Pulmonary embolus     s/p IVC filter  . History of gastrointestinal bleeding     no ASA or coumadin  . Contrast dye induced nephropathy   . Orthostatic hypotension     ROS: Negative except as per HPI  BP 138/50  Pulse 71  Ht 5\' 3"  (1.6 m)  Wt 173 lb (78.472 kg)  BMI 30.65 kg/m2  PHYSICAL EXAM: Pt is alert and oriented, elderly woman in NAD HEENT: normal Neck: JVP - significantly elevated, carotids 2+= without bruits Lungs: CTA bilaterally CV: irregularly irregular with grade 2/6 systolic ejection murmur at the left sternal border Abd: soft, NT, Positive BS, no hepatomegaly Ext: trace pretibial edema bilaterally, distal pulses intact and equal Skin: warm/dry no rash  EKG:  Atrial fibrillation heart rate 71 beats per minute, age-indeterminate septal infarction. Nonspecific T-wave abnormalities present.  ASSESSMENT AND PLAN:

## 2010-10-02 NOTE — Assessment & Plan Note (Signed)
We need to continue close followup this patient's renal function as she requires high-dose diuretics to stay out of heart failure.

## 2010-10-04 ENCOUNTER — Other Ambulatory Visit (INDEPENDENT_AMBULATORY_CARE_PROVIDER_SITE_OTHER): Payer: Medicare Other | Admitting: Family Medicine

## 2010-10-04 DIAGNOSIS — I5030 Unspecified diastolic (congestive) heart failure: Secondary | ICD-10-CM

## 2010-10-04 DIAGNOSIS — I5022 Chronic systolic (congestive) heart failure: Secondary | ICD-10-CM

## 2010-10-04 LAB — BASIC METABOLIC PANEL
CO2: 33 mEq/L — ABNORMAL HIGH (ref 19–32)
Chloride: 94 mEq/L — ABNORMAL LOW (ref 96–112)
GFR: 30.95 mL/min — ABNORMAL LOW (ref >60.00)
Glucose, Bld: 138 mg/dL — ABNORMAL HIGH (ref 70–99)
Potassium: 3.4 mEq/L — ABNORMAL LOW (ref 3.5–5.1)
Sodium: 136 mEq/L (ref 135–145)

## 2010-10-09 ENCOUNTER — Ambulatory Visit (INDEPENDENT_AMBULATORY_CARE_PROVIDER_SITE_OTHER): Payer: Medicare Other | Admitting: Emergency Medicine

## 2010-10-09 ENCOUNTER — Encounter: Payer: Self-pay | Admitting: Emergency Medicine

## 2010-10-09 DIAGNOSIS — J449 Chronic obstructive pulmonary disease, unspecified: Secondary | ICD-10-CM

## 2010-10-09 NOTE — Patient Instructions (Addendum)
We will continue your Dulera, increase to 2 puffs twice a day.  We will perform full breathing tests at your next visit  Wear your oxygen as you have been doing.  Follow up with Dr Delton Coombes next available with PFT

## 2010-10-09 NOTE — Progress Notes (Signed)
77 yo woman, former tobacco. Hx of CAD, HTN w diastolic dysfxn, DM, RAS w hx renal failure and ICU hospitalization in 11/2009, s/p stenting. Aortobifemoral bypass. Hx DVT and PE, anticoag stopped yrs ago after GIB. She is s/p IVC filter placement. Dx with COPD several yrs ago, on O2 for at least a year (she cant recall when started).   Referred by Dr Dallas Schimke for her COPD. She complains of some L back and flank pain w a deep breath, exertional dyspnea especially when rushing. She is still able to do some housework, unable to grocery shop for several years. Complains of a lot of throat clearing, daily cough prod white phlegm. No recent PFTs. She can't recall any exacerbations that require abx or prednisone. She is on pulsed oxygen, but is not geting a pulse with every breath. Occas nasal gtt, not daily. Occas GERD on omeprazole. On Dulera and Spiriva. She does not have a rescue inhaler.   CXR 10/11 - hyperinflation, mild interstitial prominence, R effusion and LL vol loss.   ROV 10/09/10 -- follows up for dyspnea and COPD, remote hx PE. Last time started SABA prn, ordered PFT but not done yet. She was admitted for volume overload two weeks ago, diuresed and lost over 20 lbs. Resulted in significant improvement in her breathing. Since last visit her Spiriva was stopped (? By whom, she can't remember, maybe in the hospital), continued [facility]. She isn;t sure she misses the Spiriva.    Gen: Pleasant, well-nourished, in no distress,  normal affect  ENT: No lesions,  mouth clear,  oropharynx clear, no postnasal drip  Neck: No JVD, no TMG, no carotid bruits  Lungs: No use of accessory muscles, no dullness to percussion, clear without rales or rhonchi  Cardiovascular: RRR, heart sounds normal, no murmur or gallops, no peripheral edema  Musculoskeletal: No deformities, no cyanosis or clubbing  Neuro: alert, non focal  Skin: Warm, no lesions or rashes  COPD She isn';t sure what BD's she takes, probably  Dulera but only qd.  - increase dulera to bid - O2 - full PFT - rov next avalaible - probably restart Spiriva next time depending on PFT

## 2010-10-09 NOTE — Assessment & Plan Note (Signed)
She isn';t sure what BD's she takes, probably Dulera but only qd.  - increase dulera to bid - O2 - full PFT - rov next avalaible - probably restart Spiriva next time depending on PFT

## 2010-10-16 ENCOUNTER — Other Ambulatory Visit: Payer: Self-pay | Admitting: *Deleted

## 2010-10-16 MED ORDER — AMLODIPINE BESYLATE 10 MG PO TABS: 10.0000 mg | ORAL_TABLET | Freq: Every day | ORAL | Status: DC

## 2010-10-18 NOTE — Discharge Summary (Signed)
NAMEGLADIE, GRAVETTE NO.:  [id number]  MEDICAL RECORD NO.:  [id number]  LOCATION:  4736                         FACILITY:  [facility]  PHYSICIAN:  Veverly Fells. Excell Seltzer, MD  DATE OF BIRTH:  [birth year]  DATE OF ADMISSION:  09/19/2010 DATE OF DISCHARGE:  09/21/2010                              DISCHARGE SUMMARY   PRIMARY CARDIOLOGIST:  Veverly Fells. Excell Seltzer, MD  PRIMARY CARE PROVIDER:  Juleen China, MD  DISCHARGE DIAGNOSIS:  Acute-on-chronic diastolic congestive heart failure.  SECONDARY DIAGNOSES: 1. Atrial fibrillation but not a Coumadin candidate. 2. History of gastrointestinal bleed with small bowel arteriovenous     malformations. 3. History of deep vein thrombosis and pulmonary embolism status post     inferior vena cava filter in 2009. 4. Peripheral arterial disease status post left renal artery stent in     August 2009. 5. Coronary artery disease status post coronary artery bypass grafting     x2 with patent grafts by catheterization in 2009. 6. Hypertension. 7. History of abdominal aortic aneurysm with repair in 1997. 8. Stage III chronic kidney disease. 9. History of contrast induced nephropathy. 10.Diabetes mellitus which is diet-controlled. 11.Hyperlipidemia. 12.Chronic obstructive pulmonary disease on home O2. 13.Peptic ulcer disease. 14.Diverticulosis. 15.Osteopenia. 16.History of orthostatic hypotension.  ALLERGIES:  IV CONTRAST causing nephropathy, PENICILLIN, SULFA, NIASPAN, CODEINE, MORPHINE, DEMEROL and DARVON.  PROCEDURES:  None.  HISTORY OF PRESENT ILLNESS:  A 77 year old female with the above complex problem list who over the past month has been experiencing increasing lower extremity edema with early satiety and weight gain of approximately 20 pounds.  She has also had progressive dyspnea on exertion and slight worsening of chronic two-pillow orthopnea.  She is being followed closely by primary care provider with attempts made  to manage her as an outpatient including addition of every other day Zaroxolyn, however, the patient had no significant improvement and when she was seen by her primary care provider on September 19, 2010, a decision was made to sent to [facility] for cardiac evaluation.  In the ED, the patient indeed had significant lower extremity edema and chest x-ray showed probable mild congestive heart failure.  She was admitted for further evaluation and IV diuresis.  HOSPITAL COURSE:  The patient was placed on Lasix 80 mg IV b.i.d.  On this regimen, she has diuresed with reduction in weight from 82.5 kg on admission to 80.7 kg at the time of discharge.  She has had improvement in her lower extremity edema and her creatinine has remained within her normal range at 1.52 today.  We have transitioned her back to her home dose of torsemide as well as every other day Zaroxolyn.  Of note, the patient reported that she was taking both Lasix and torsemide at home and we have advised to discontinue Lasix.  She will be discharged home today in good condition.  DISCHARGE LABORATORIES:  Hemoglobin 10.0, hematocrit 30.4, WBC 5.9, platelets 182.  Sodium 132, potassium 3.4, chloride 89, CO2 36, BUN 42, creatinine 1.52, glucose 112.  Total bilirubin 0.6, alkaline phosphatase 127, AST 17, ALT 11, total protein 6.4, albumin 3.5, calcium 8.5, magnesium 2.2, CK 63, MB 1.9, troponin-I  less than 0.30.  TSH 2.164. BNP on September 20, 2010, was 5942.  DISPOSITION:  The patient is being discharged home today in good condition.  FOLLOWUP PLANS AND APPOINTMENTS:  The patient will follow up with Tereso Newcomer, PA [facility] in [place] on September 26, 2010, at 3:15 p.m.  The patient will have a basic metabolic profile as well as a BNP that day.  She will follow up with Dr. Patsy Lager as previously scheduled.  DISCHARGE MEDICATIONS: 1. Hydralazine 10 mg t.i.d. (new). 2. Toprol-XL 50 mg half a tablet daily. 3. Amlodipine 10  mg daily. 4. Cozaar 100 mg daily. 5. Ferrous sulfate 325 mg daily. 6. Imdur 30 mg daily. 7. Kenalog 0.1% applied b.i.d. topically. 8. Ketorolac ophthalmic 0.4% 1 drop right eye q.i.d. 9. Klor-Con 10 mEq daily. 10.Dulera inhaler 100-5 two puffs b.i.d. 11.Nitroglycerin 0.4 mg sublingual p.r.n. chest pain. 12.Oxycodone 5 mg b.i.d. p.r.n. 13.Prilosec 40 mg daily. 14.Remeron 15 mg at bedtime. 15.Spiriva 18 mcg daily. 16.Torsemide 20 mg 2 tablets b.i.d. 17.Vytorin 10/20 mg at bedtime. 18.Xalatan 0.005% 1 drop at bedtime. 19.Zaroxolyn 5 mg every other day.  OUTSTANDING LABORATORY STUDIES:  Followup basic metabolic profile and BNP on September 26, 2010.  DURATION OF DISCHARGE ENCOUNTER:  40 minutes including physician time.     Nicolasa Ducking, ANP   ______________________________ Veverly Fells. Excell Seltzer, MD    CB/MEDQ  D:  09/21/2010  T:  09/22/2010  Job:  [id number]  cc:   Juleen China, MD  Electronically Signed by Nicolasa Ducking ANP on 10/16/2010 04:44:08 PM Electronically Signed by Tonny Bollman MD on 10/18/2010 12:46:36 AM

## 2010-10-30 ENCOUNTER — Encounter: Payer: Self-pay | Admitting: Physician Assistant

## 2010-10-30 ENCOUNTER — Telehealth: Payer: Self-pay | Admitting: *Deleted

## 2010-10-30 ENCOUNTER — Ambulatory Visit (INDEPENDENT_AMBULATORY_CARE_PROVIDER_SITE_OTHER): Payer: Medicare Other | Admitting: Physician Assistant

## 2010-10-30 VITALS — BP 120/54 | HR 64 | Resp 20 | Ht 64.0 in | Wt 165.4 lb

## 2010-10-30 DIAGNOSIS — I10 Essential (primary) hypertension: Secondary | ICD-10-CM

## 2010-10-30 DIAGNOSIS — I5032 Chronic diastolic (congestive) heart failure: Secondary | ICD-10-CM

## 2010-10-30 DIAGNOSIS — I5022 Chronic systolic (congestive) heart failure: Secondary | ICD-10-CM

## 2010-10-30 DIAGNOSIS — I509 Heart failure, unspecified: Secondary | ICD-10-CM

## 2010-10-30 DIAGNOSIS — I4891 Unspecified atrial fibrillation: Secondary | ICD-10-CM

## 2010-10-30 DIAGNOSIS — R5381 Other malaise: Secondary | ICD-10-CM | POA: Insufficient documentation

## 2010-10-30 DIAGNOSIS — R5383 Other fatigue: Secondary | ICD-10-CM | POA: Insufficient documentation

## 2010-10-30 DIAGNOSIS — I2510 Atherosclerotic heart disease of native coronary artery without angina pectoris: Secondary | ICD-10-CM

## 2010-10-30 LAB — CBC WITH DIFFERENTIAL/PLATELET
Eosinophils Absolute: 0.1 10*3/uL (ref 0.0–0.7)
Lymphs Abs: 1.6 10*3/uL (ref 0.7–4.0)
MCHC: 33.2 g/dL (ref 30.0–36.0)
MCV: 84.6 fl (ref 78.0–100.0)
Monocytes Absolute: 0.8 10*3/uL (ref 0.1–1.0)
Neutrophils Relative %: 76.6 % (ref 43.0–77.0)
Platelets: 183 10*3/uL (ref 150.0–400.0)

## 2010-10-30 LAB — BASIC METABOLIC PANEL
CO2: 30 mEq/L (ref 19–32)
Chloride: 96 mEq/L (ref 96–112)
Creatinine, Ser: 3.2 mg/dL — ABNORMAL HIGH (ref 0.4–1.2)
Glucose, Bld: 107 mg/dL — ABNORMAL HIGH (ref 70–99)

## 2010-10-30 NOTE — Telephone Encounter (Signed)
Repeat bmet 11/01/10 at [place] location, pt aware of bmet lab results today due to critical GFR. Danielle Rankin

## 2010-10-30 NOTE — Patient Instructions (Signed)
Your physician recommends that you schedule a follow-up appointment in: 12/13/10 @ 11:45 to see Dr. Excell Seltzer  Your physician recommends that you return for lab work in: TODAY BMET, BNP, CBC W/DIFF 428.32, 780.79

## 2010-10-30 NOTE — Assessment & Plan Note (Signed)
Controlled rate.  She is not on Coumadin secondary to GI bleeding in the past.

## 2010-10-30 NOTE — Assessment & Plan Note (Addendum)
Check a CBC as noted.  Hemoglobin was 10 in June 2012.

## 2010-10-30 NOTE — Assessment & Plan Note (Signed)
Her weight is down another 8 pounds since she last saw Dr. Excell Seltzer.  Since June 20, she is down 20 pounds.  Unfortunately, her breathing is not improve that much.  I suspect a lot of this is related to her COPD.  From a volume standpoint, she appears to be improved on her current dose of Demadex and metolazone.  She does complain of weakness.  I will check a basic metabolic panel today to followup on her renal function and potassium.  I will also get a BNP now that her weight is down so significantly to have somewhat of a baseline to compare with.  I will also check a CBC given her fatigue and prior history of GI bleeding.  She will followup with her PCP and Dr. Excell Seltzer as already scheduled.

## 2010-10-30 NOTE — Assessment & Plan Note (Signed)
Controlled.  

## 2010-10-30 NOTE — Assessment & Plan Note (Signed)
She denies any current symptoms of angina.

## 2010-10-30 NOTE — Progress Notes (Signed)
History of Present Illness: Primary Cardiologist: Dr. Tonny Bollman  Donna Reese is a 77 y.o. female presenting for followup evaluation. She has multiple cardiovascular problems, but her biggest problem of late has been diastolic heart failure. She's had multiple hospital admissions and outpatient management has been very difficult. She was recently hospitalized June 20 through June 22 and treated with intravenous diuretics. She had a good response and was able to be discharged after about a 48 hour admission.  She saw Dr. Excell Seltzer recently in follow up on 7/3.  At that time she was doing well on a combination of torsemide and metolazone. Her weight was down about 10 pounds from her office visit the prior week. She continued to have dyspnea with exertion and her leg edema persisted but was less than it had been previously. The patient is on home oxygen.  She also has permanent atrial fibrillation, history of DVT and is not a candidate for warfarin, CAD status post CABG, and aortobifemoral bypass.  Her diuretic regimen was unchanged at that office visit.  Followup labs done 7/5: Potassium 3.4, chloride 94, CO2 33, BUN 27, creatinine 1.7.  Her creatinine was fairly stable.  Her PCP adjusted her potassium.  She returns for followup today.  She continues to feel weak.  She is concerned that she may be losing blood again.  She denies any gross hematochezia.  She does have occasional bright red blood per rectum seen on the tissue.  This is a chronic symptom for some years now.  She continues to have chronic shortness of breath with exertion.  She is probable class III.  She sleeps on 2 pillows.  This is chronic.  She denies PND.  Her lower extremity edema persists but is improved.  She denies syncope.  She has an occasional, atypical chest pain.  This is a very quick, sharp pain that lasts less than a second.  She denies any symptoms reminiscent of her previous angina.  Past Medical History  Diagnosis Date    . Diabetes mellitus   . COPD (chronic obstructive pulmonary disease)   . Diverticulosis   . Osteopenia   . PUD (peptic ulcer disease)   . Peripheral arterial disease   . Hypertension   . Hyperlipidemia   . Diastolic heart failure     a. echo 9/11: EF 60-65%, mild LVH, grade 2 diast dysfxn, AV mean gradient 9 mmHg, mild MR, RVE, RVSP 59  . Coronary artery disease     a. s/p CABG;  b. cath 8/09: LM occluded; RCA 50%; L-LAD ok, Radial-OM ok; bilat renal art stenosis  . Renovascular hypertension   . Renal artery stenosis     a. s/p L RA stent 8/09  . Paroxysmal a-fib     no coumadin 2/2 GI Blee  . DVT (deep venous thrombosis)   . Pulmonary embolus     s/p IVC filter  . History of gastrointestinal bleeding     no ASA or coumadin  . Contrast dye induced nephropathy   . Orthostatic hypotension     Current Outpatient Prescriptions  Medication Sig Dispense Refill  . albuterol (VENTOLIN HFA) 108 (90 BASE) MCG/ACT inhaler Inhale 2 puffs into the lungs every 4 (four) hours as needed.        Marland Kitchen amLODipine (NORVASC) 10 MG tablet Take 1 tablet (10 mg total) by mouth daily.  30 tablet  6  . ezetimibe-simvastatin (VYTORIN) 10-20 MG per tablet Take 1 tablet by mouth at bedtime.        Marland Kitchen  ferrous sulfate 325 (65 FE) MG tablet Take 325 mg by mouth daily with breakfast.        . hydrALAZINE (APRESOLINE) 10 MG tablet Take 10 mg by mouth 3 (three) times daily.        . isosorbide mononitrate (IMDUR) 30 MG 24 hr tablet Take 1 tablet (30 mg total) by mouth daily.  30 tablet  11  . ketorolac (ACULAR) 0.4 % SOLN Place 1 drop into the right eye 4 (four) times daily.        Marland Kitchen latanoprost (XALATAN) 0.005 % ophthalmic solution Place 1 drop into both eyes daily.        Marland Kitchen losartan (COZAAR) 100 MG tablet Take 1 tablet (100 mg total) by mouth daily.  30 tablet  11  . metolazone (ZAROXOLYN) 5 MG tablet Take 1 tab on Mon, Wed, Fri, Sat  30 tablet  6  . metoprolol (TOPROL-XL) 50 MG 24 hr tablet Take 25 mg by mouth  daily.       . mirtazapine (REMERON) 15 MG tablet TAKE 1 TABLET BY MOUTH AT BEDTIME  30 tablet  5  . Mometasone Furo-Formoterol Fum (DULERA) 200-5 MCG/ACT AERO Inhale 2 puffs into the lungs 2 (two) times daily.  1 Inhaler  11  . nitroGLYCERIN (NITROSTAT) 0.4 MG SL tablet Place 0.4 mg under the tongue every 5 (five) minutes as needed.        . NON FORMULARY 2 L/min by Nasal route continuous. oxygen       . omeprazole (PRILOSEC) 40 MG capsule Take 40 mg by mouth daily.        . OXYCODONE HCL PO Take 5 mg by mouth daily. As needed for pain       . potassium chloride (KLOR-CON) 10 MEQ CR tablet Take 10 mEq by mouth daily.        Marland Kitchen tiotropium (SPIRIVA) 18 MCG inhalation capsule Place 18 mcg into inhaler and inhale daily.        Marland Kitchen torsemide (DEMADEX) 20 MG tablet Take 2 tablets (40 mg total) by mouth 2 (two) times daily.  120 tablet  2    Allergies: Allergies  Allergen Reactions  . Codeine   . Meperidine Hcl   . Morphine   . Niacin     REACTION: rash  . Penicillins   . Propoxyphene Hcl   . Sulfonamide Derivatives     Vital Signs: BP 120/54  Pulse 64  Resp 20  Ht 5\' 4"  (1.626 m)  Wt 165 lb 6.4 oz (75.025 kg)  BMI 28.39 kg/m2  PHYSICAL EXAM: Well nourished, well developed, in no acute distress HEENT: normal; Palpebral conjunctiva are pink bilaterally Neck: no JVD At 90 degrees Cardiac:  normal S1, S2; Irregularly irregular rhythm Lungs:  Decreased breath sounds bilaterally, no wheezing, rhonchi or rales Abd: soft, nontender Ext: Very trace, nonpitting, bilateral edema Skin: warm and dry Neuro:  CNs 2-12 intact, no focal abnormalities noted Psych: Flat affect  EKG:  Atrial fibrillation, heart rate 64, normal axis, poor R-wave progression, nonspecific ST-T wave changes, no significant change when compared to prior tracings  ASSESSMENT AND PLAN:

## 2010-11-01 ENCOUNTER — Inpatient Hospital Stay (HOSPITAL_COMMUNITY): Payer: Medicare Other

## 2010-11-01 ENCOUNTER — Telehealth: Payer: Self-pay | Admitting: *Deleted

## 2010-11-01 ENCOUNTER — Encounter: Payer: Self-pay | Admitting: Family Medicine

## 2010-11-01 ENCOUNTER — Inpatient Hospital Stay (HOSPITAL_COMMUNITY)
Admission: AD | Admit: 2010-11-01 | Discharge: 2010-11-04 | DRG: 683 | Disposition: A | Discharge status: Home / Self Care | Payer: Medicare Other | Source: Ambulatory Visit | Attending: Internal Medicine | Admitting: Internal Medicine

## 2010-11-01 ENCOUNTER — Ambulatory Visit (INDEPENDENT_AMBULATORY_CARE_PROVIDER_SITE_OTHER): Payer: Medicare Other | Admitting: Family Medicine

## 2010-11-01 ENCOUNTER — Other Ambulatory Visit: Payer: Medicare Other

## 2010-11-01 VITALS — BP 140/62 | HR 76 | Temp 98.7°F | Wt 164.4 lb

## 2010-11-01 DIAGNOSIS — I4891 Unspecified atrial fibrillation: Secondary | ICD-10-CM | POA: Diagnosis present

## 2010-11-01 DIAGNOSIS — K5730 Diverticulosis of large intestine without perforation or abscess without bleeding: Secondary | ICD-10-CM | POA: Diagnosis present

## 2010-11-01 DIAGNOSIS — Z951 Presence of aortocoronary bypass graft: Secondary | ICD-10-CM

## 2010-11-01 DIAGNOSIS — Z888 Allergy status to other drugs, medicaments and biological substances status: Secondary | ICD-10-CM

## 2010-11-01 DIAGNOSIS — R5383 Other fatigue: Secondary | ICD-10-CM

## 2010-11-01 DIAGNOSIS — Z79899 Other long term (current) drug therapy: Secondary | ICD-10-CM

## 2010-11-01 DIAGNOSIS — N183 Chronic kidney disease, stage 3 unspecified: Secondary | ICD-10-CM | POA: Diagnosis present

## 2010-11-01 DIAGNOSIS — Z880 Allergy status to penicillin: Secondary | ICD-10-CM

## 2010-11-01 DIAGNOSIS — N259 Disorder resulting from impaired renal tubular function, unspecified: Secondary | ICD-10-CM

## 2010-11-01 DIAGNOSIS — Z882 Allergy status to sulfonamides status: Secondary | ICD-10-CM

## 2010-11-01 DIAGNOSIS — Z66 Do not resuscitate: Secondary | ICD-10-CM | POA: Diagnosis present

## 2010-11-01 DIAGNOSIS — J4489 Other specified chronic obstructive pulmonary disease: Secondary | ICD-10-CM | POA: Diagnosis present

## 2010-11-01 DIAGNOSIS — I739 Peripheral vascular disease, unspecified: Secondary | ICD-10-CM | POA: Diagnosis present

## 2010-11-01 DIAGNOSIS — N179 Acute kidney failure, unspecified: Principal | ICD-10-CM | POA: Diagnosis present

## 2010-11-01 DIAGNOSIS — J449 Chronic obstructive pulmonary disease, unspecified: Secondary | ICD-10-CM

## 2010-11-01 DIAGNOSIS — Z86718 Personal history of other venous thrombosis and embolism: Secondary | ICD-10-CM

## 2010-11-01 DIAGNOSIS — E119 Type 2 diabetes mellitus without complications: Secondary | ICD-10-CM | POA: Diagnosis present

## 2010-11-01 DIAGNOSIS — Z87891 Personal history of nicotine dependence: Secondary | ICD-10-CM

## 2010-11-01 DIAGNOSIS — Z9981 Dependence on supplemental oxygen: Secondary | ICD-10-CM

## 2010-11-01 DIAGNOSIS — Z8711 Personal history of peptic ulcer disease: Secondary | ICD-10-CM

## 2010-11-01 DIAGNOSIS — I129 Hypertensive chronic kidney disease with stage 1 through stage 4 chronic kidney disease, or unspecified chronic kidney disease: Secondary | ICD-10-CM | POA: Diagnosis present

## 2010-11-01 DIAGNOSIS — E785 Hyperlipidemia, unspecified: Secondary | ICD-10-CM | POA: Diagnosis present

## 2010-11-01 DIAGNOSIS — I5032 Chronic diastolic (congestive) heart failure: Secondary | ICD-10-CM | POA: Diagnosis present

## 2010-11-01 DIAGNOSIS — I2510 Atherosclerotic heart disease of native coronary artery without angina pectoris: Secondary | ICD-10-CM | POA: Diagnosis present

## 2010-11-01 DIAGNOSIS — R5381 Other malaise: Secondary | ICD-10-CM

## 2010-11-01 DIAGNOSIS — Z91041 Radiographic dye allergy status: Secondary | ICD-10-CM

## 2010-11-01 DIAGNOSIS — Z86711 Personal history of pulmonary embolism: Secondary | ICD-10-CM

## 2010-11-01 DIAGNOSIS — I5022 Chronic systolic (congestive) heart failure: Secondary | ICD-10-CM

## 2010-11-01 DIAGNOSIS — I509 Heart failure, unspecified: Secondary | ICD-10-CM | POA: Diagnosis present

## 2010-11-01 LAB — DIFFERENTIAL
Basophils Absolute: 0 10*3/uL (ref 0.0–0.1)
Eosinophils Absolute: 0.1 10*3/uL (ref 0.0–0.7)
Lymphocytes Relative: 19 % (ref 12–46)
Lymphs Abs: 1.2 10*3/uL (ref 0.7–4.0)
Neutrophils Relative %: 73 % (ref 43–77)

## 2010-11-01 LAB — CARDIAC PANEL(CRET KIN+CKTOT+MB+TROPI)
CK, MB: 2.2 ng/mL (ref 0.3–4.0)
Relative Index: INVALID (ref 0.0–2.5)
Total CK: 46 U/L (ref 7–177)

## 2010-11-01 LAB — BASIC METABOLIC PANEL
BUN: 80 mg/dL — ABNORMAL HIGH (ref 6–23)
CO2: 29 mEq/L (ref 19–32)
Calcium: 9 mg/dL (ref 8.4–10.5)
Calcium: 8.9 mg/dL (ref 8.4–10.5)
Creatinine, Ser: 3.42 mg/dL — ABNORMAL HIGH (ref 0.50–1.10)
GFR calc non Af Amer: 13 mL/min — ABNORMAL LOW (ref >60)
GFR: 13.63 mL/min — CL (ref >60.00)
Glucose, Bld: 167 mg/dL — ABNORMAL HIGH (ref 70–99)
Potassium: 3.4 mEq/L — ABNORMAL LOW (ref 3.5–5.1)
Sodium: 141 mEq/L (ref 135–145)

## 2010-11-01 LAB — APTT: aPTT: 35 seconds (ref 24–37)

## 2010-11-01 LAB — CBC: Platelets: 157 10*3/uL (ref 150–400)

## 2010-11-01 MED ORDER — HYDROCODONE-ACETAMINOPHEN 5-500 MG PO TABS: 0.5000 | ORAL_TABLET | Freq: Four times a day (QID) | ORAL | Status: AC | PRN

## 2010-11-01 NOTE — Telephone Encounter (Signed)
Message copied by Alois Cliche on Thu Nov 01, 2010  5:22 PM ------      Message from: [place], [place] T      Created: Thu Nov 01, 2010  5:14 PM      Regarding: lab results       Repeat BMET done today to follow up from 2 days ago.      Her BUN and creatinine are worse.      I recommend she go to the ED for evaluation and management of acute renal failure.      Please call patient and advise her to go to the ED and fax labs for EDP.            I will cc this note to Dr. Patsy Lager and Dr. Excell Seltzer to make them aware.            Tereso Newcomer, PA-C

## 2010-11-01 NOTE — Progress Notes (Addendum)
Subjective:    Patient ID: Donna Reese, female    DOB: Dec 15, 1931, 77 y.o.   MRN: [id number]  HPI  ARF, Cr 3.2 on 10/30/2010 - metolazone, Cozaar, and K held by Cardiology Hosp Admit 09/19/2010 for CHF exac, BNP > 5000.  Complex patient with multiple medical comorbidities including significant diastolic heart failure, oxygen-dependent chronic obstructive pulmonary disease, chronic renal insufficiency, generalized weakness, coronary disease, atrial fibrillation, history of pulmonary emboli status post IVC filter placement, and multiple others detailed below.  ARF with Cr 3.2 on last BMP Basic Metabolic Panel:    Component Value Date/Time   NA 137 10/30/2010 1050   K 3.6 10/30/2010 1050   CL 96 10/30/2010 1050   CO2 30 10/30/2010 1050   BUN 73* 10/30/2010 1050   CREATININE 3.2* 10/30/2010 1050   GLUCOSE 107* 10/30/2010 1050   CALCIUM 8.7 10/30/2010 1050   Holding metolazone, cozaar, potassium.  Having some pain. Down in her left leg. Posterior buttocks, L sided, down to foot  Breathing doing OK.   Decreased appetite Having to force herself to eat.  Throwing up some Drinking not that much.   Sad about predicament, not feeling well at all.  The PMH, PSH, Social History, Family History, Medications, and allergies have been reviewed in [facility], and have been updated if relevant.   Review of Systems ROS: no fevers, chills, sweats. Shortness of breath at baseline. Occasional chest pain. Notable lower extremity edema - appears improved compared to prior evaluations. Some nausea, appears somewhat sad.     Objective:   Physical Exam  Blood pressure 140/62, pulse 76, temperature 98.7 F (37.1 C), temperature source Oral, weight 164 lb 6.4 oz (74.571 kg), SpO2 97.00%. .  GEN: WDWN, NAD, Non-toxic, A & O x 3 HEENT: Atraumatic, Normocephalic. Neck supple. No masses, No LAD. Ears and Nose: No external deformity. CV: irreg, irreg. 3/6 SEM. + JVD to neck PULM: decreased BS, but no crackles  or wheezes ABD: S, NT, ND, +BS. No rebound tenderness. No HSM.  EXTR: 1+ LE edema NEURO Normal gait.  PSYCH: Normally interactive. Conversant. Mildly sad. Occ cries.     Assessment & Plan:   Assessment and Plan: 1. ARF on CRF: Check BMP. If remains in RF or worsening, will need further assistance and probable admission  2.  CHF: Stable weight while holding meds above.   3. Chronic obstructive pulmonary disease: SOB at baseline - on oxygen. Reports compliance with Dulera and spiriva 4. Generalized fatigue / weakness: suspect multifactorial, more difficult problem. Multiple medical problems in elderly. Encouraged to continue to eat. Taking Remeron 15 mg at night. Some probs sleeping. Mood changes I suspect are a lot situational.   Addendum:    Chemistry      Component Value Date/Time   NA 141 11/01/2010 1040   K 3.4* 11/01/2010 1040   CL 95* 11/01/2010 1040   CO2 26 11/01/2010 1040   BUN 80* 11/01/2010 1040   CREATININE 3.5* 11/01/2010 1040      Component Value Date/Time   CALCIUM 9.0 11/01/2010 1040   ALKPHOS 127* 09/19/2010 1415   AST 17 09/19/2010 1415   ALT 11 09/19/2010 1415   BILITOT 0.6 09/19/2010 1415     BMP has returned with Cr worsening from 3.2 to 3.5 with a BUN of 80 despite d/c metolazone and other meds by Cardiology on Monday.  Spoke to patient and Dr. Onalee Hua from [facility] and have arranged for direct admission to telemetry floor today.  Please consult inpatient nephrology.

## 2010-11-01 NOTE — Telephone Encounter (Signed)
SPOKE WITH PT  IS WAITING  ON DAUGHTER TO PICK HER UP HAS BEEN ADMITTED TO HOSPITAL TO 4 TH FLOOR  BY ANOTHER MD  PT NOT SURE IF IT WAS KIDNEY MD OR NOT .Donna Reese

## 2010-11-02 ENCOUNTER — Inpatient Hospital Stay (HOSPITAL_COMMUNITY): Payer: Medicare Other

## 2010-11-02 LAB — CBC
MCH: 28.2 pg (ref 26.0–34.0)
MCV: 80.8 fL (ref 78.0–100.0)
Platelets: 126 10*3/uL — ABNORMAL LOW (ref 150–400)
RDW: 14.5 % (ref 11.5–15.5)
WBC: 5 10*3/uL (ref 4.0–10.5)

## 2010-11-02 LAB — BASIC METABOLIC PANEL
BUN: 80 mg/dL — ABNORMAL HIGH (ref 6–23)
Creatinine, Ser: 3.14 mg/dL — ABNORMAL HIGH (ref 0.50–1.10)
GFR calc Af Amer: 17 mL/min — ABNORMAL LOW (ref >60)
GFR calc non Af Amer: 14 mL/min — ABNORMAL LOW (ref >60)

## 2010-11-02 LAB — PRO B NATRIURETIC PEPTIDE: Pro B Natriuretic peptide (BNP): 6351 pg/mL — ABNORMAL HIGH (ref 0–450)

## 2010-11-03 LAB — BASIC METABOLIC PANEL
Calcium: 8.6 mg/dL (ref 8.4–10.5)
Creatinine, Ser: 2.01 mg/dL — ABNORMAL HIGH (ref 0.50–1.10)
GFR calc Af Amer: 29 mL/min — ABNORMAL LOW (ref >60)
GFR calc non Af Amer: 24 mL/min — ABNORMAL LOW (ref >60)

## 2010-11-04 LAB — BASIC METABOLIC PANEL
Calcium: 9 mg/dL (ref 8.4–10.5)
GFR calc Af Amer: 46 mL/min — ABNORMAL LOW (ref >60)
GFR calc non Af Amer: 38 mL/min — ABNORMAL LOW (ref >60)
Sodium: 139 mEq/L (ref 135–145)

## 2010-11-04 NOTE — Discharge Summary (Signed)
NAMEMANAL, KREUTZER NO.:  [id number]  MEDICAL RECORD NO.:  [id number]  LOCATION:  4098                         FACILITY:  [facility]  PHYSICIAN:  Isidor Holts, M.D.  DATE OF BIRTH:  1931-07-05  DATE OF ADMISSION:  11/01/2010 DATE OF DISCHARGE:  11/04/2010                              DISCHARGE SUMMARY   PRIMARY MD:  Karleen Hampshire T. Copland, MD  PRIMARY CARDIOLOGIST:  Veverly Fells. Excell Seltzer, MD  DISCHARGE DIAGNOSES: 1. Acute on chronic renal failure, secondary to over diuresis. 2. History of diastolic congestive heart failure, ejection fraction     65%. 3. History of chronic atrial fibrillation. Not considered Coumadin     candidate, secondary to previous bleeding complications. 4. Coronary artery disease status post coronary artery bypass graft in     2009. 5. History of peripheral arterial disease status post left renal     artery stent, August 2009. 6. Status post abdominal aortic aneurysm repair in 1997. 7. Hypertension. 8. Diet-controlled type 2 diabetes mellitus. 9. Dyslipidemia. 10.Chronic obstructive pulmonary disease, on home oxygen. 11.Previous history of gastrointestinal bleed secondary to small bowel     arteriovenous malformations. 12.Remote history of peptic ulcer disease. 13.Diverticulosis.  DISCHARGE MEDICATIONS: 1. Torsemide 20 mg p.o. daily (was on 40 mg p.o. b.i.d.) 2. Cozaar 100 mg p.o. q.a.m. 3. Ferrous sulfate 325 mg p.o. q.a.m. 4. Hydralazine 10 mg p.o. t.i.d. 5. Imdur 30 mg p.o. daily. 6. Ketorolac ophthalmic solution 0.4% 1 drop right eye q.i.d. 7. Klor-Con 10 mEq p.o. daily. 8. Dulera inhaler (100-5) 2 puffs inhaled b.i.d. 9. Nitroglycerin 0.4 mg sublingually p.r.n. q.5 minutes for chest pain     up to 3 doses. 10.Prilosec 40 mg p.o. q.a.m. 11.Remeron 15 mg p.o. at bedtime. 12.Spiriva HandiHaler (18 mcg) 1 capsule inhaled daily. 13.Vytorin (10/20) 1 tablet p.o. at bedtime. 14.Xalatan 0.005% ophthalmic solution 1 drop each eye at  bedtime.  Note, Zaroxolyn has been discontinued.  PROCEDURES: 1. Renal ultrasound scan, November 02, 2010; this was negative for     hydronephrosis.  There was a normal renal cortex and small left     renal calculus. 2. Chest x-ray, November 02, 2010; this showed chronic interstitial     changes without definite superimposed acute cardiopulmonary     disease.  There was interval reduction and now small right-sided     pleural effusion.  CONSULTATIONS:  None.  ADMISSION HISTORY:  As in H and P notes of November 01, 2010, dictated by Dr. Tarry Kos.  However, in brief, this is a 77 year old female, who is status post hospitalization from September 19, 2010, to September 21, 2010, for acute decompensation of chronic diastolic congestive heart failure, history of atrial fibrillation not Coumadin candidate, history of coronary artery disease status post CABG in 2009, status post renal artery stent with stenosis August 2009, status post abdominal aortic aneurysm repair in 1997, hypertension, type 2 diabetes mellitus diet controlled, dyslipidemia, COPD on home oxygen, remote history of GI bleed secondary to small bowel AVMs, peptic ulcer disease, diverticulosis, history of DVT/pulmonary embolism status post inferior vena cava filter in 2009, presenting via direct admission for worsening renal status.  The patient as described above, is status  post hospitalization from September 19, 2010, to September 21, 2010, for acute decompensation of chronic diastolic congestive heart failure.  She was discharged on rather high doses of torsemide,  i.e. 80 mg b.i.d. as well as Zaroxolyn 5 mg every other day.  In addition, she was on ARB therapy. Reportedly, congestive heart failure had now become compensated, but over the last 1 week, she experienced a steady increase in BUN and creatinine levels and on the day of admission, BUN was 80 and creatinine 3.5.  She was subsequently admitted for further evaluation, investigation,  and management.  CLINICAL COURSE: 1. Acute on chronic renal failure.  The patient presented as described     above, with a creatinine of 3.5 and BUN 80.  This was secondary to     over diuresis, against a background of continuing ARB treatment and     known chronic kidney disease.  The patient was therefore managed     with discontinuation of ARB and all diuretics, as well as     administration of gentle intravenous fluid hydration.  Renal     ultrasound scan was unremarkable for acute pathology, although it     did demonstrate a nonobstructive renal calculus.  The patient's     creatinine improved dramatically and on November 04, 2010, BUN was 55     with a creatinine of 1.35.  The patient's baseline creatinine based     on findings in electronic medical records, is between 1.49 and 1.7.     Intravenous fluids have been discontinued.  The patient is being     discharged on 20 mg of torsemide daily and ARB has been     reintroduced.  Certainly, she will need close follow up by her     primary MD, with monitoring of her renal indices as well as     electrolytes.  2. Coronary artery disease.  There were no problems referable to this. 3. History of diastolic congestive heart failure.  The patient showed     no clinical evidence of congestive heart failure during the course     of this hospitalization.  4. Type 2 diabetes mellitus.  This is diet controlled and the patient     remained euglycemic throughout the course of this hospitalization     on appropriate diet.  5. Dyslipidemia.  The patient continues on Vytorin.  6. COPD.  The patient was stable on 2 liters of oxygen per nasal     cannula and her oxygen saturations were well over 90%.  DISPOSITION:  The patient as of November 04, 2010, was asymptomatic, very keen to be discharged with no new issues.  She was considered clinically stable for discharge and therefore discharged accordingly.  Activity as tolerated.  Recommended to  increase activity slowly.  Diet heart healthy/carbohydrate modified.  FOLLOWUP INSTRUCTIONS:  The patient will follow up with her primary MD, Dr. Patsy Lager in the coming week.  She is instructed to call for an appointment.  We anticipate that Dr. Patsy Lager will monitor the patient's electrolytes, BUN, and creatinine levels.  In addition, the patient will follow up routinely with her primary cardiologist, Dr. Tonny Bollman, per prior scheduled appointment.     Isidor Holts, M.D.     CO/MEDQ  D:  11/04/2010  T:  11/04/2010  Job:  [id number]  cc:   Juleen China, MD Veverly Fells. Excell Seltzer, MD  Electronically Signed by Isidor Holts M.D. on 11/04/2010 07:20:25 PM

## 2010-11-06 ENCOUNTER — Telehealth: Payer: Self-pay | Admitting: *Deleted

## 2010-11-06 NOTE — Telephone Encounter (Signed)
[facility]zz advised at [facility]

## 2010-11-06 NOTE — Telephone Encounter (Signed)
Home health nurse states pt has recently been in the hospital, dx'd with stage 3 kidney disease.  She has been released and nurse is asking for verbal order to continue to see patient for the next 3 weeks.

## 2010-11-06 NOTE — Telephone Encounter (Signed)
Verbal order: please continue to see the next 3 weeks

## 2010-11-06 NOTE — H&P (Signed)
Donna Reese, BOTELHO NO.:  [id number]  MEDICAL RECORD NO.:  [id number]  LOCATION:                                 FACILITY:  PHYSICIAN:  Tarry Kos, MD       DATE OF BIRTH:  05/04/1931  DATE OF ADMISSION: DATE OF DISCHARGE:                             HISTORY & PHYSICAL   CHIEF COMPLAINT:  Worsening renal failure.  HISTORY OF PRESENT ILLNESS:  Donna Reese is a very pleasant 77 year old female who has a history of diastolic congestive heart failure, oxygen- dependent COPD, chronic kidney disease whose primary care physician is Dr. Patsy Lager and her primary cardiologist is Dr. Excell Seltzer.  Apparently, she was discharged on September 22, 2010, because of acute-on-chronic diastolic congestive heart failure, and her diuretics were increased at that time. Her Lasix was stopped and she was placed on Zaroxolyn every other day and torsemide 20 mg 2 tablets twice a day.  During that time, her BUN and creatinine were stable at 38 and 1.6.  The patient states that since that time she has diuresed well, all of her lower extremity edema has resolved, her respiratory status has improved.  However, over the last week, it was found that her creatinine began to rise.  On October 04, 2010, her BUN and creatinine were 27 and 1.7, by October 30, 2010, her BUN and creatinine had jumped to 73 and 3.2 and was rechecked again today and they were 80 and 3.5, at which time it was decided for admission.  She did have her diuretics adjusted over the last couple of weeks this past Monday when her creatinine was 3.2 which was approximately 3 days ago. Her Zaroxolyn and other nephrogenic medications were stopped by her cardiologist.  Despite that, over the last couple of days, her creatinine had bumped up further, so it was thought that she needed direct admission for IV fluids.  Otherwise, Donna Reese does not have any complaints.  She says her lower extremity edema has almost completely resolved.  Her  respiratory status is at her baseline.  Again, she requires oxygen at home, mostly for her COPD.  She denies any pain, denies any nausea, vomiting, denies any fevers.  She has been having appropriate amount of urinary output.  REVIEW OF SYSTEMS:  Otherwise negative.  PAST MEDICAL HISTORY: 1. Acute-on-chronic renal failure over the last several weeks as     above. 2. History of diastolic congestive heart failure with EF of 65%. 3. AFib, not a Coumadin candidate. 4. History of GI bleed in the past due to small bowel AVMs. 5. History of DVT and PE status post IVC filter placement in 2009. 6. PAD status post left renal artery stent in August 2009. 7. Coronary artery disease status post CABG in 2009. 8. Hypertension. 9. AAA repair in 1997. 10.History of contrast-induced nephropathy. 11.Diabetes. 12.Hyperlipidemia. 13.COPD, chronic oxygen dependency. 14.Peptic ulcer disease. 15.Diverticulosis. 16.Osteopenia. 17.History of orthostatic hypotension.  ALLERGIES:  IV CONTRAST causes renal failure, PENICILLIN rash, SULFA, NIASPAN, CODEINE, MORPHINE, DEMEROL, and DARVON.  SOCIAL HISTORY:  She is an ex-smoker.  Denies alcohol.  No IV drug abuse.  She lives with her husband.  She is DNR.  She does not ever wish to be intubated or have CPR done in the future.  MEDICATIONS:  Per discharge MedRec sheet, September 22, 2010, she is on: 1. Hydralazine 10 mg three times a day. 2. Toprol-XL 50 mg 1/2 tablet a day. 3. Amlodipine 10 mg daily. 4. Cozaar 100 mg daily. 5. Iron sulfate 325 mg daily. 6. Imdur 30 mg daily. 7. Kenalog 0.1% applied twice a day topically. 8. Ketoralac ophthalmic 0.4% one drop right eye q.i.d. 9. KCl 10 mEq a day. 10.Dulera inhaler 100/5 two puffs twice a day. 11.Nitroglycerin 0.4 mg sublingual as needed for chest pain. 12.Oxycodone 5 mg twice a day as needed. 13.Prilosec 40 mg daily. 14.Remeron 15 mg nightly. 15.Spiriva 18 mcg daily. 16.Torsemide 40 mg twice a  day. 17.Vytorin 10/20 nightly. 18.Xalatan 0.005% one drop nightly. 19.Zaroxolyn 5 mg every other day. Again, over the last 3 days, her torsemide, Zaroxolyn, and she thinks her Cozaar have all been held.  PHYSICAL EXAMINATION:  VITAL SIGNS:  Temperature is 98.5, pulse 68, respirations 17, blood pressure 124/65, O2 sats 95% on 2 L nasal cannula. GENERAL:  She is alert and oriented x4, in no apparent distress, cooperative fairly. HEENT:  Extraocular movements intact.  Pupils equal and reactive to light.  Oropharynx clear.  Mucous membranes moist. NECK:  No JVD.  No carotid bruits. CARDIAC:  Regular rate and rhythm without murmurs, rubs, or gallops. CHEST:  Clear to auscultation bilaterally.  No wheezes, rhonchi, or rales.  No crackles. ABDOMEN:  Soft, nontender, nondistended.  Positive bowel sounds.  No hepatosplenomegaly. EXTREMITIES:  Without clubbing, cyanosis, or edema. PSYCH:  Normal affect. NEUROLOGIC:  No focal neurologic deficits. SKIN:  No rashes.  LABORATORY DATA:  Today's BUN and creatinine as above, potassium is 3.4, sodium is 141.  ASSESSMENT AND PLAN:  This is a 77 year old female with acute-on-chronic renal failure secondary to over diuresis. 1. Acute-on-chronic renal failure secondary to recent increase in her     diuretic regimen for her chronic diastolic congestive heart     failure.  We will stop all of her diuretics and nephrogenic     medications, and we will provide her with some gentle IV fluids,     half-normal saline at 75 mL/hour for the next 12 hours.  Repeat a     basic metabolic panel in the morning.  Measure accurate Is and Os     and place a Foley catheter for now. 2. Chronic congestive heart failure, diastolic dysfunction.  This     appears to be stable.  Again, we will need to monitor her fluid     status closely secondary to acute-on-chronic renal failure and     obviously, we are going to hold her diuretics right now. 3. We will also obtain a  bilateral renal ultrasound. 4. We will clarify exactly what she is taking at home right now. 5. The patient is do not resuscitate.  Further recommendation     depending on overall hospital course.          ______________________________ Tarry Kos, MD     RD/MEDQ  D:  11/01/2010  T:  11/02/2010  Job:  [id number]  Electronically Signed by Tarry Kos MD on 11/06/2010 09:52:43 PM

## 2010-11-07 ENCOUNTER — Ambulatory Visit (INDEPENDENT_AMBULATORY_CARE_PROVIDER_SITE_OTHER): Payer: Medicare Other | Admitting: Family Medicine

## 2010-11-07 ENCOUNTER — Encounter: Payer: Self-pay | Admitting: Family Medicine

## 2010-11-07 VITALS — BP 130/62 | HR 92 | Temp 98.4°F | Wt 164.0 lb

## 2010-11-07 DIAGNOSIS — I10 Essential (primary) hypertension: Secondary | ICD-10-CM

## 2010-11-07 DIAGNOSIS — I5032 Chronic diastolic (congestive) heart failure: Secondary | ICD-10-CM

## 2010-11-07 DIAGNOSIS — F329 Major depressive disorder, single episode, unspecified: Secondary | ICD-10-CM | POA: Insufficient documentation

## 2010-11-07 DIAGNOSIS — J449 Chronic obstructive pulmonary disease, unspecified: Secondary | ICD-10-CM

## 2010-11-07 DIAGNOSIS — I509 Heart failure, unspecified: Secondary | ICD-10-CM

## 2010-11-07 DIAGNOSIS — N259 Disorder resulting from impaired renal tubular function, unspecified: Secondary | ICD-10-CM

## 2010-11-07 LAB — BASIC METABOLIC PANEL
BUN: 36 mg/dL — ABNORMAL HIGH (ref 6–23)
GFR: 39.37 mL/min — ABNORMAL LOW (ref >60.00)
Potassium: 3.9 mEq/L (ref 3.5–5.1)
Sodium: 141 mEq/L (ref 135–145)

## 2010-11-07 MED ORDER — COLCHICINE 0.6 MG PO TABS: 0.6000 mg | ORAL_TABLET | Freq: Two times a day (BID) | ORAL | Status: DC

## 2010-11-07 MED ORDER — FLUOXETINE HCL 10 MG PO TABS: 10.0000 mg | ORAL_TABLET | Freq: Every day | ORAL | Status: AC

## 2010-11-07 NOTE — Progress Notes (Signed)
  Subjective:    Patient ID: Donna Reese, female    DOB: [birth year], 77 y.o.   MRN: [id number]  HPI  77 year old here for hosp f/u s/p admission with acute renal failure:  Admitted to the hospital, gentle rehydration, held all diuretics and CR and GFR returned to baseline levels. Patient feeling a little better. Now.  Basic Metabolic Panel:    Component Value Date/Time   NA 141 11/07/2010 0958   K 3.9 11/07/2010 0958   CL 97 11/07/2010 0958   CO2 35* 11/07/2010 0958   BUN 36* 11/07/2010 0958   CREATININE 1.4* 11/07/2010 0958   GLUCOSE 115* 11/07/2010 0958   CALCIUM 8.8 11/07/2010 0958   Metolazone d/c Decrease Torsemide to 20 mg po bid and weight has remained constant Cozaar supposed to have been restarted but held by patient.  Dep: increased sadness over the last few weeks and months. Had a death in the family, sad that she cannot do what she wants and has been ill.  Hosp: ARF, Cr 3.2 on 10/30/2010 - metolazone, Cozaar, and K held by Cardiology Hosp Admit 09/19/2010 for CHF exac, BNP > 5000.  Complex patient with multiple medical comorbidities including significant diastolic heart failure, oxygen-dependent chronic obstructive pulmonary disease, chronic renal insufficiency, generalized weakness, coronary disease, atrial fibrillation, history of pulmonary emboli status post IVC filter placement, and multiple others detailed below.   Review of Systems SOB at baseline. Cont LE edema at baseline. No CP. As above    Objective:   Physical Exam  GEN: WDWN, NAD, Non-toxic, A & O x 3 HEENT: Atraumatic, Normocephalic. Neck supple. No masses, No LAD. Ears and Nose: No external deformity. CV: irreg, irreg. 3/6 SEM. + JVD to neck PULM: decreased BS, but no crackles or wheezes ABD: S, NT, ND, +BS. No rebound tenderness. No HSM.  EXTR: 1+ LE edema Psych: appears mildly sad     Assessment & Plan:    1. RENAL INSUFFICIENCY - improved Basic metabolic panel  2. Chronic diastolic heart failure -  restart Cozaar   3. COPD - compliant now with meds   4. HYPERTENSION - reviewed meds, compliant   5. Depression - new, counselled, start low dose Prozac

## 2010-11-07 NOTE — Patient Instructions (Signed)
Gout: take colcrys tablets twice a day until toe feels better  Ok to restart the LOSARTAN blood pressure medicine (also for your heart)   Recheck in 1 month

## 2010-11-09 ENCOUNTER — Ambulatory Visit: Payer: Medicare Other | Admitting: Emergency Medicine

## 2010-11-27 ENCOUNTER — Telehealth: Payer: Self-pay | Admitting: *Deleted

## 2010-11-27 NOTE — Telephone Encounter (Signed)
Received a call from [facility] with [facility] stating that they need Dr. Patsy Lager to fill out another from for Andrey Spearman.  The form that they received today had several areas that were incorrect or missing information.  She stated that the areas that needs corrections/changes are: test date, where preformed, and question #2 section B.  Please advise.  Her call back number is [phone] ext. [phone].  Fax number [phone].

## 2010-11-28 ENCOUNTER — Inpatient Hospital Stay (HOSPITAL_COMMUNITY)
Admission: AD | Admit: 2010-11-28 | Discharge: 2010-12-01 | DRG: 683 | Disposition: A | Discharge status: Home / Self Care | Payer: Medicare Other | Source: Ambulatory Visit | Attending: Internal Medicine | Admitting: Internal Medicine

## 2010-11-28 ENCOUNTER — Ambulatory Visit (INDEPENDENT_AMBULATORY_CARE_PROVIDER_SITE_OTHER): Payer: Medicare Other | Admitting: Family Medicine

## 2010-11-28 ENCOUNTER — Encounter: Payer: Self-pay | Admitting: Family Medicine

## 2010-11-28 ENCOUNTER — Inpatient Hospital Stay (HOSPITAL_COMMUNITY): Payer: Medicare Other

## 2010-11-28 VITALS — BP 98/60 | HR 73 | Temp 98.3°F | Wt 155.0 lb

## 2010-11-28 DIAGNOSIS — M109 Gout, unspecified: Secondary | ICD-10-CM

## 2010-11-28 DIAGNOSIS — I509 Heart failure, unspecified: Secondary | ICD-10-CM | POA: Diagnosis present

## 2010-11-28 DIAGNOSIS — I2510 Atherosclerotic heart disease of native coronary artery without angina pectoris: Secondary | ICD-10-CM | POA: Diagnosis present

## 2010-11-28 DIAGNOSIS — R197 Diarrhea, unspecified: Secondary | ICD-10-CM | POA: Diagnosis present

## 2010-11-28 DIAGNOSIS — J449 Chronic obstructive pulmonary disease, unspecified: Secondary | ICD-10-CM | POA: Diagnosis present

## 2010-11-28 DIAGNOSIS — N183 Chronic kidney disease, stage 3 unspecified: Secondary | ICD-10-CM | POA: Diagnosis present

## 2010-11-28 DIAGNOSIS — D61818 Other pancytopenia: Secondary | ICD-10-CM | POA: Diagnosis present

## 2010-11-28 DIAGNOSIS — T448X5A Adverse effect of centrally-acting and adrenergic-neuron-blocking agents, initial encounter: Secondary | ICD-10-CM | POA: Diagnosis present

## 2010-11-28 DIAGNOSIS — I4891 Unspecified atrial fibrillation: Secondary | ICD-10-CM | POA: Diagnosis present

## 2010-11-28 DIAGNOSIS — D649 Anemia, unspecified: Secondary | ICD-10-CM

## 2010-11-28 DIAGNOSIS — I129 Hypertensive chronic kidney disease with stage 1 through stage 4 chronic kidney disease, or unspecified chronic kidney disease: Secondary | ICD-10-CM | POA: Diagnosis present

## 2010-11-28 DIAGNOSIS — Z9981 Dependence on supplemental oxygen: Secondary | ICD-10-CM

## 2010-11-28 DIAGNOSIS — R7989 Other specified abnormal findings of blood chemistry: Secondary | ICD-10-CM | POA: Diagnosis present

## 2010-11-28 DIAGNOSIS — E119 Type 2 diabetes mellitus without complications: Secondary | ICD-10-CM | POA: Diagnosis present

## 2010-11-28 DIAGNOSIS — E860 Dehydration: Secondary | ICD-10-CM | POA: Diagnosis present

## 2010-11-28 DIAGNOSIS — R630 Anorexia: Secondary | ICD-10-CM

## 2010-11-28 DIAGNOSIS — F329 Major depressive disorder, single episode, unspecified: Secondary | ICD-10-CM

## 2010-11-28 DIAGNOSIS — E785 Hyperlipidemia, unspecified: Secondary | ICD-10-CM | POA: Diagnosis present

## 2010-11-28 DIAGNOSIS — N259 Disorder resulting from impaired renal tubular function, unspecified: Secondary | ICD-10-CM

## 2010-11-28 DIAGNOSIS — I739 Peripheral vascular disease, unspecified: Secondary | ICD-10-CM | POA: Diagnosis present

## 2010-11-28 DIAGNOSIS — Z79899 Other long term (current) drug therapy: Secondary | ICD-10-CM

## 2010-11-28 DIAGNOSIS — N179 Acute kidney failure, unspecified: Principal | ICD-10-CM | POA: Diagnosis present

## 2010-11-28 DIAGNOSIS — J4489 Other specified chronic obstructive pulmonary disease: Secondary | ICD-10-CM | POA: Diagnosis present

## 2010-11-28 DIAGNOSIS — I5032 Chronic diastolic (congestive) heart failure: Secondary | ICD-10-CM | POA: Diagnosis present

## 2010-11-28 DIAGNOSIS — T502X5A Adverse effect of carbonic-anhydrase inhibitors, benzothiadiazides and other diuretics, initial encounter: Secondary | ICD-10-CM | POA: Diagnosis present

## 2010-11-28 DIAGNOSIS — I701 Atherosclerosis of renal artery: Secondary | ICD-10-CM | POA: Diagnosis present

## 2010-11-28 DIAGNOSIS — Z951 Presence of aortocoronary bypass graft: Secondary | ICD-10-CM

## 2010-11-28 LAB — BASIC METABOLIC PANEL
CO2: 27 mEq/L (ref 19–32)
Calcium: 7.9 mg/dL — ABNORMAL LOW (ref 8.4–10.5)
Chloride: 101 mEq/L (ref 96–112)
Sodium: 142 mEq/L (ref 135–145)

## 2010-11-28 LAB — CBC WITH DIFFERENTIAL/PLATELET
Basophils Relative: 0.5 % (ref 0.0–3.0)
Eosinophils Absolute: 0 10*3/uL (ref 0.0–0.7)
Hemoglobin: 11.5 g/dL — ABNORMAL LOW (ref 12.0–15.0)
Lymphocytes Relative: 17.6 % (ref 12.0–46.0)
MCHC: 33.6 g/dL (ref 30.0–36.0)
Monocytes Relative: 6 % (ref 3.0–12.0)
Neutro Abs: 3.8 10*3/uL (ref 1.4–7.7)
RBC: 4.03 Mil/uL (ref 3.87–5.11)

## 2010-11-28 LAB — LIPASE, BLOOD: Lipase: 26 U/L (ref 11–59)

## 2010-11-28 LAB — HEPATIC FUNCTION PANEL
Albumin: 3.4 g/dL — ABNORMAL LOW (ref 3.5–5.2)
Total Protein: 6.2 g/dL (ref 6.0–8.3)

## 2010-11-28 LAB — CARDIAC PANEL(CRET KIN+CKTOT+MB+TROPI)
CK, MB: 4.8 ng/mL — ABNORMAL HIGH (ref 0.3–4.0)
Relative Index: 1.1 (ref 0.0–2.5)
Troponin I: <0.3 ng/mL (ref <0.30)

## 2010-11-28 LAB — MAGNESIUM: Magnesium: 1.4 mg/dL — ABNORMAL LOW (ref 1.5–2.5)

## 2010-11-28 MED ORDER — MEGESTROL ACETATE 400 MG/10ML PO SUSP: 400.0000 mg | Freq: Every day | ORAL | Status: DC

## 2010-11-28 NOTE — Progress Notes (Addendum)
Subjective:    Patient ID: Donna Reese, female    DOB: October 22, 1931, 77 y.o.   MRN: [id number]  HPI  Donna Reese, a 77 y.o. female presents today in the office for the following:    Complex patient with multiple medical comorbidities including significant diastolic heart failure, oxygen-dependent chronic obstructive pulmonary disease, chronic renal insufficiency, generalized weakness, coronary disease, atrial fibrillation, history of pulmonary emboli status post IVC filter placement, and multiple others detailed below  Recent hosp with ARF, improved with decreased diuresis  Feels weak, cannot eat. Does not get hungry.   Feeling sad. Feels disgusted and cannot do anything. Sleeping fairly well. Sleeps til about six or seven the next day.   Some diarrhea - 2-3 times a day. Having diarrhea  SOB with minimal activity.  The PMH, PSH, Social History, Family History, Medications, and allergies have been reviewed in [facility], and have been updated if relevant.  Review of Systems ROS: GEN: above, fatigue, generally not feeling well - tired all the time. Sad some. GI: above Pulm: above CV: edema is doing fairly well. Interactive and getting along well at home.  Otherwise, ROS is as per the HPI.     Objective:   Physical Exam   Physical Exam  Blood pressure 98/60, pulse 73, temperature 98.3 F (36.8 C), temperature source Oral, weight 155 lb (70.308 kg), SpO2 97.00%.  GEN: WDWN, NAD, Non-toxic, A & O x 3 HEENT: Atraumatic, Normocephalic. Neck supple. No masses, No LAD. Ears and Nose: No external deformity. CV: 2/6 SEM PULM: CTA B, no wheezes, crackles, rhonchi. No retractions. No resp. distress. No accessory muscle use. EXTR: tr edema NEURO Normal gait.  PSYCH: Normally interactive. Conversant. Not depressed or anxious appearing.  Calm demeanor.        Assessment & Plan:   1. Loss of appetite : d/c Remeron as this has not helped. Trial of Megace, if this is not helping,  then using Marinol next would be reasonable. megestrol (MEGACE) 400 MG/10ML suspension  2. RENAL INSUFFICIENCY  Basic metabolic panel  3. Anemia  CBC with Differential  4. DEPRESSION : cont prozac at 10 mg now. Suspect a lot of this is due to her situation and chronic disease limitations while her mind is perfectly clear   5. GOUT, ACUTE : d/c colcrys, as may be making diarrhea worse     Acute Renal Failure: Labs have returned and the patient is in acute renal failure Cr 4.4 and GFR of 10. Diuretics have been significantly decreased so that now the patient is only taking Torsemide 20 mg daily. Spironolactone has also previously been d/c. Recent loose stools q 2-3 times daily.  I discussed with the patient over the phone and she would like to go to [facility] for direct admit, which I think is appropriate. We also discussed end of life care and she does not want to go on dialysis.  Basic Metabolic Panel:    Component Value Date/Time   NA 142 11/28/2010 1058   K 3.6 11/28/2010 1058   CL 101 11/28/2010 1058   CO2 27 11/28/2010 1058   BUN 67* 11/28/2010 1058   CREATININE 4.4* 11/28/2010 1058   GLUCOSE 108* 11/28/2010 1058   CALCIUM 7.9* 11/28/2010 1058   Hgb stable  CBC:    Component Value Date/Time   WBC 5.1 11/28/2010 1058   HGB 11.5* 11/28/2010 1058   HCT 34.2* 11/28/2010 1058   PLT 151.0 11/28/2010 1058   MCV 84.7 11/28/2010 1058  NEUTROABS 3.8 11/28/2010 1058   LYMPHSABS 0.9 11/28/2010 1058   MONOABS 0.3 11/28/2010 1058   EOSABS 0.0 11/28/2010 1058   BASOSABS 0.0 11/28/2010 1058

## 2010-11-28 NOTE — Progress Notes (Signed)
  Subjective:    Patient ID: Donna Reese, female    DOB: 1931/08/28, 77 y.o.   MRN: [id number]  HPI    Review of Systems     Objective:   Physical Exam        Assessment & Plan:

## 2010-11-28 NOTE — Telephone Encounter (Signed)
Papers complete

## 2010-11-28 NOTE — Patient Instructions (Addendum)
STOP COLCRYS (COLCHICINE) Stop Mirtazipine  Start the Megace liquid: 2 tsp by mouth each morning  F/u 1 month

## 2010-11-29 LAB — BASIC METABOLIC PANEL
Calcium: 7.5 mg/dL — ABNORMAL LOW (ref 8.4–10.5)
Creatinine, Ser: 4.48 mg/dL — ABNORMAL HIGH (ref 0.50–1.10)
GFR calc non Af Amer: 10 mL/min — ABNORMAL LOW (ref >60)
Sodium: 142 mEq/L (ref 135–145)

## 2010-11-29 LAB — TECHNOLOGIST SMEAR REVIEW

## 2010-11-29 LAB — URIC ACID: Uric Acid, Serum: 17.5 mg/dL — ABNORMAL HIGH (ref 2.4–7.0)

## 2010-11-29 LAB — CREATININE, URINE, RANDOM: Creatinine, Urine: 285.66 mg/dL

## 2010-11-29 LAB — CBC
Hemoglobin: 9.9 g/dL — ABNORMAL LOW (ref 12.0–15.0)
RBC: 3.56 MIL/uL — ABNORMAL LOW (ref 3.87–5.11)
WBC: 3.5 10*3/uL — ABNORMAL LOW (ref 4.0–10.5)

## 2010-11-29 LAB — CLOSTRIDIUM DIFFICILE BY PCR: Toxigenic C. Difficile by PCR: NEGATIVE

## 2010-11-29 LAB — CARDIAC PANEL(CRET KIN+CKTOT+MB+TROPI)
CK, MB: 4 ng/mL (ref 0.3–4.0)
Relative Index: 1.2 (ref 0.0–2.5)
Total CK: 333 U/L — ABNORMAL HIGH (ref 7–177)

## 2010-11-30 DIAGNOSIS — Z87898 Personal history of other specified conditions: Secondary | ICD-10-CM

## 2010-11-30 DIAGNOSIS — D61818 Other pancytopenia: Secondary | ICD-10-CM

## 2010-11-30 LAB — PHOSPHORUS: Phosphorus: 3.6 mg/dL (ref 2.3–4.6)

## 2010-11-30 LAB — COMPREHENSIVE METABOLIC PANEL
AST: 32 U/L (ref 0–37)
Albumin: 2.9 g/dL — ABNORMAL LOW (ref 3.5–5.2)
Alkaline Phosphatase: 119 U/L — ABNORMAL HIGH (ref 39–117)
BUN: 62 mg/dL — ABNORMAL HIGH (ref 6–23)
CO2: 25 mEq/L (ref 19–32)
Chloride: 103 mEq/L (ref 96–112)
Potassium: 3.5 mEq/L (ref 3.5–5.1)
Total Bilirubin: 0.5 mg/dL (ref 0.3–1.2)

## 2010-11-30 LAB — DIFFERENTIAL
Basophils Absolute: 0 10*3/uL (ref 0.0–0.1)
Eosinophils Relative: 1 % (ref 0–5)
Lymphocytes Relative: 22 % (ref 12–46)
Neutro Abs: 2.2 10*3/uL (ref 1.7–7.7)
Neutrophils Relative %: 70 % (ref 43–77)

## 2010-11-30 LAB — CBC
HCT: 27.9 % — ABNORMAL LOW (ref 36.0–46.0)
RBC: 3.47 MIL/uL — ABNORMAL LOW (ref 3.87–5.11)
RDW: 15.3 % (ref 11.5–15.5)
WBC: 3.1 10*3/uL — ABNORMAL LOW (ref 4.0–10.5)

## 2010-11-30 LAB — FERRITIN: Ferritin: 184 ng/mL (ref 10–291)

## 2010-11-30 LAB — IRON AND TIBC
Saturation Ratios: 22 % (ref 20–55)
TIBC: 169 ug/dL — ABNORMAL LOW (ref 250–470)

## 2010-12-01 LAB — BASIC METABOLIC PANEL
Chloride: 107 mEq/L (ref 96–112)
GFR calc Af Amer: 34 mL/min — ABNORMAL LOW (ref >60)
Potassium: 3.1 mEq/L — ABNORMAL LOW (ref 3.5–5.1)

## 2010-12-01 LAB — CBC
Platelets: 73 10*3/uL — ABNORMAL LOW (ref 150–400)
RDW: 15.6 % — ABNORMAL HIGH (ref 11.5–15.5)
WBC: 2.3 10*3/uL — ABNORMAL LOW (ref 4.0–10.5)

## 2010-12-04 LAB — PROTEIN ELECTROPH W RFLX QUANT IMMUNOGLOBULINS
Alpha-1-Globulin: 7.4 % — ABNORMAL HIGH (ref 2.9–4.9)
Alpha-2-Globulin: 12.3 % — ABNORMAL HIGH (ref 7.1–11.8)
Total Protein ELP: 5.6 g/dL — ABNORMAL LOW (ref 6.0–8.3)

## 2010-12-04 NOTE — Consult Note (Signed)
Donna Reese, Donna Reese NO.:  [id number]  MEDICAL RECORD NO.:  [id number]  LOCATION:  4735                         FACILITY:  [facility]  PHYSICIAN:  Josph Macho, M.D.  DATE OF BIRTH:  1931-12-14  DATE OF CONSULTATION: DATE OF DISCHARGE:                                CONSULTATION   REFERRING PHYSICIAN:  Dr. Jomarie Longs of [facility].  REASON FOR CONSULTATION: 1. Mild pancytopenia. 2. Chronic renal failure.  HISTORY OF PRESENT ILLNESS:  Donna Reese is a very charming 77 year old white female.  She has multiple medical problems.  She has chronic renal failure.  She is not on dialysis.  She has chronic congestive heart failure.  She has atrial fibrillation, but not a candidate for Coumadin. She has hypertension, type 2 diabetes, hyperlipidemia, COPD - on home oxygen.  She is also on quite a few medications.  She was admitted back on November 28, 2010.  Apparently, there was some issue with over diuresis.  She came in with elevated BUN and creatinine. BUN was 67 and creatinine was 4.4.  When she was admitted, her CBC showed a white cell count of 5.1, hemoglobin 11.5, hematocrit 34.2, platelet count 151, MCV was 85.  She got diuresed.  Her CBC on the November 29, 2010, showed a white cell count of 3.5, hemoglobin 9.9, hematocrit 28.6, platelet count 95,000.  She does have an elevated uric acid.  Her uric acid is markedly elevated at 17.5 on November 29, 2010. She had normal TSH.  She had normal LDH.  She had diarrhea.  C. diff was negative.  She had a CBC done on November 30, 2010.  This showed white cell count of 3.1, hemoglobin 9.7, hematocrit 27.9, platelet count 92,000.  MCV was 80.  She did have a ferritin of 184.  Iron studies showed an iron saturation of 22%.  Going through some of her old records.  Back in April, white cell count was 5.9, hemoglobin 10.4, and platelet count was 121.  Back in 2011, platelet count was 182, hemoglobin 8.8, and hematocrit  27.3.  White cell count was 9.  She has had no problems with bleeding.  Again, she is not a candidate for Coumadin.  She has not noted any bruising.  She is not a vegetarian. She has had some problems with nausea, vomiting, and diarrhea.  She did have a CT of the abdomen and pelvis on November 28, 2010.  This basically was unremarkable.  A chest x-ray done on November 28, 2010, also was unremarkable.  There was osteopenia of the bones.  She had stable T5 and T8 compression fracture.  Dr. Jomarie Longs noted that she had I guess an elevated light chain I guess a year ago.  I am unsure where that result came from.  Again, we were asked to see her because of the mild pancytopenia.  PAST MEDICAL HISTORY:  Remarkable for: 1. Chronic renal insufficiency. 2. Chronic congestive heart failure. 3. Chronic atrial fibrillation. 4. Hypertension. 5. Type 2 diabetes. 6. Hyperlipidemia. 7. COPD. 8. History of GI bleeding.  ALLERGIES: 1. IV CONTRAST. 2. PENICILLIN. 3. SULFA. 4. NIACIN. 5. CODEINE. 6. MORPHINE. 7. DEMEROL. 8. DARVOCET.  ADMISSION MEDICATIONS: 1. Demadex. 2. Cozaar. 3. Iron sulfate. 4. Hydralazine. 5. Imdur. 6. Dulera. 7. Nitroglycerin. 8. Prilosec. 9. Remeron. 10.Spiriva. 11.Vytorin. 12.Xalatan.  FAMILY HISTORY:  Remarkable for her mother who passed away from "cancer."  The patient not sure what type.  History of stroke in the family.  REVIEW OF SYSTEMS:  As stated in history of present illness.  PHYSICAL EXAMINATION:  GENERAL:  This is a fairly spry white female in no obvious distress. VITAL SIGNS:  98.6, pulse 80, respiratory rate 18, blood pressure 129/42. HEAD AND NECK:  Normocephalic, atraumatic skull.  There are no ocular or oral lesions.  There are no palpable cervical or supraclavicular lymph nodes. LUNGS:  Decreased breath sounds at bases.  She has some scattered crackles bilaterally. CARDIAC:  Irregular rate and rhythm consistent with atrial  fibrillation. She has no murmurs, rubs, or bruits. ABDOMEN:  Soft.  She has good bowel sounds.  There is no fluid wave. There is no palpable hepatosplenomegaly. BACK:  No tenderness over the spine, ribs, or hips. EXTREMITIES:  No clubbing, cyanosis, or edema. SKIN:  No rash, ecchymosis, or petechiae.  LABORATORY DATA:  White cell count 3.1, hemoglobin 9.7, hematocrit 27.9, platelet count 92,000.  BUN 62, creatinine 2.85.  Total protein is 5.5 with an albumin of 2.9.  Calcium 7.6.  Peripheral smear shows mild anisocytosis.  She had no nucleated red blood cells.  I see no rouleaux formation.  There is some hypochromic red cells.  There is no schistocytes.  I see no target cells.  White cells are slightly decreased in number.  She had good maturation of her white blood cells. I see no hypersegmented polys.  There is no immature myeloid or lymphoid cells.  I have seen no atypical lymphoid cells.  Platelets are slightly decreased in number.  She has several large platelets.  Platelets are well granulated.  IMPRESSION:  Donna Reese is a 77 year old female with multiple medical problems.  She has mild pancytopenia.  It is really hard to say what the exact etiology of the pancytopenia is. Her blood smear certainly does not look unremarkable.  I do not see any immaturity with her white cells.  I do not see any nucleated red blood cells.  Platelets are also of good maturity and morphology.  Again, I think multifactorial as to what the problem is with her.  The fact that she has chronic renal insufficiency and certainly would make erythropoietin deficiency most likely reason for her anemia.  She does have history of GI bleeding.  Her ferritin was 184.  Her iron saturation was only 22%.  Her iron itself was 38.  I suspect that there maybe some element of iron deficiency.  I do not think oral iron is really going to help her out.  I do not think she absorbs oral iron.  She may need to have IV  iron.  This is likely to be done as an outpatient.  She is also on medications that could lead to leukopenia, thrombocytopenia.  Hydralazine is very well known to cause a lupus-like picture.  As such, this might be a culprit for the leukopenia and thrombocytopenia.  She is on other medications that might also cause some thrombocytopenia.  She is on an ACE inhibitor, which has been known to lead to thrombocytopenia.  Ultimately, a bone marrow biopsy would be the definitive test.  However, I just do not see that one would really need to pursue that.  This, I think would  really be "over the aggressive intervention."  I just do not see that doing a bone marrow biopsy would really change management for her.  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  She wants to go home.  I think she is safe to go home.  She can easily be followed up as an outpatient.  She lives in [place].  As such, it would be a huge struggle for her go to the [facility] in [place] where I am located.  I could easily set her up with one of my colleagues at the [facility] for evaluation.  I had a nice talk with Donna Reese and her family.  They are all very very nice.  Donna Reese is in good spirits.  I really feel that her blood issues are "minor" compared to what else is going on with her.  I suspect that she probably would respond to IV iron in addition to the Procrit/Aranesp.  It certainly would not surprise me if she had some degree of iron deficiency.  Again, we can follow up with Donna Reese as an outpatient and can see her at [facility]     Josph Macho, M.D.     PRE/MEDQ  D:  11/30/2010  T:  12/01/2010  Job:  [id number]  cc:   Juleen China, MD Veverly Fells. Excell Seltzer, MD Dr. Marina Gravel  Electronically Signed by Arlan Organ  on 12/04/2010 04:54:09 AM

## 2010-12-05 LAB — UIFE/LIGHT CHAINS/TP QN, 24-HR UR
Albumin, U: DETECTED
Free Lambda Lt Chains,Ur: 0.2 mg/dL (ref 0.02–0.67)
Gamma Globulin, Urine: DETECTED — AB

## 2010-12-10 ENCOUNTER — Encounter: Payer: Self-pay | Admitting: Family Medicine

## 2010-12-10 ENCOUNTER — Ambulatory Visit (INDEPENDENT_AMBULATORY_CARE_PROVIDER_SITE_OTHER): Payer: Medicare Other | Admitting: Family Medicine

## 2010-12-10 VITALS — BP 140/72 | HR 81 | Temp 97.6°F | Wt 166.8 lb

## 2010-12-10 DIAGNOSIS — N184 Chronic kidney disease, stage 4 (severe): Secondary | ICD-10-CM

## 2010-12-10 DIAGNOSIS — D638 Anemia in other chronic diseases classified elsewhere: Secondary | ICD-10-CM | POA: Insufficient documentation

## 2010-12-10 DIAGNOSIS — R5381 Other malaise: Secondary | ICD-10-CM

## 2010-12-10 DIAGNOSIS — R5383 Other fatigue: Secondary | ICD-10-CM

## 2010-12-10 DIAGNOSIS — Z23 Encounter for immunization: Secondary | ICD-10-CM

## 2010-12-10 NOTE — Progress Notes (Signed)
Subjective:    Patient ID: Donna Reese, female    DOB: July 01, 1931, 77 y.o.   MRN: [id number]  HPI  Donna Reese, a 77 y.o. female presents today in the office for the following:    Hosp f/u from 11/28/2010 for Admission with ARF with Cr at 4.4 at the time of admission, resolved with IV hydration to Cr of 1.8 at time of discharge. Additionally, the patient continues to be anemic to a Hgb of 9 range, and Dr. Myna Hidalgo consulted at [facility] - ultimately felt most c/w anemia of chronic disease picture. Consult notes and labs reviewed.  CBC:    Component Value Date/Time   WBC 2.3* 12/01/2010 0700   HGB 9.0* 12/01/2010 0700   HCT 26.5* 12/01/2010 0700   PLT 73* 12/01/2010 0700   MCV 81.3 12/01/2010 0700   NEUTROABS 2.2 11/30/2010 0630   LYMPHSABS 0.7 11/30/2010 0630   MONOABS 0.2 11/30/2010 0630   EOSABS 0.0 11/30/2010 0630   BASOSABS 0.0 11/30/2010 0630     Complex patient with multiple medical comorbidities including significant diastolic heart failure, oxygen-dependent chronic obstructive pulmonary disease, CKD -- now stage IV, worsening in the last few months with 2 recent hosp for ARF on CRF, generalized weakness, coronary disease, atrial fibrillation - not a Coumadin candidate secondary to multiple GI bleeds, history of pulmonary emboli status post IVC filter placement, and multiple others detailed below  11/01/2010:  hosp with ARF, improved with decreased diuresis - returned to a Cr of 1.7 11/28/2010: hosp with ARF (Cr 4) and anemia   At Hospital D/C: Basic Metabolic Panel:    Component Value Date/Time   NA 143 12/01/2010 0700   K 3.1* 12/01/2010 0700   CL 107 12/01/2010 0700   CO2 24 12/01/2010 0700   BUN 50* 12/01/2010 0700   CREATININE 1.76* 12/01/2010 0700   GLUCOSE 92 12/01/2010 0700   CALCIUM 7.3* 12/01/2010 0700     Eating better. Recently started on Megace.   Patient Active Problem List  Diagnoses  . HYPERLIPIDEMIA  . GOUT, ACUTE  . DEPRESSION  . MIGRAINE WITHOUT AURA  . POLYNEUROPATHY IN  DIABETES  . HYPERTENSION  . Coronary atherosclerosis of native coronary artery  . Atrial fibrillation  . CHRONIC SYSTOLIC HEART FAILURE  . RENAL ARTERY STENOSIS  . PERIPHERAL VASCULAR DISEASE  . COPD  . PEPTIC ULCER DISEASE  . CKD (chronic kidney disease) stage 4, GFR 15-29 ml/min  . DEGENERATIVE DISC DISEASE  . OSTEOPENIA  . Personal History of Venous Thrombosis and Embolism  . SOB (shortness of breath)  . Peripheral edema  . Chronic diastolic heart failure  . Other malaise and fatigue  . Anemia of chronic disease   Past Medical History  Diagnosis Date  . Diabetes mellitus   . COPD (chronic obstructive pulmonary disease)   . Diverticulosis   . Osteopenia   . PUD (peptic ulcer disease)   . Peripheral arterial disease   . Hypertension   . Hyperlipidemia   . Diastolic heart failure     a. echo 9/11: EF 60-65%, mild LVH, grade 2 diast dysfxn, AV mean gradient 9 mmHg, mild MR, RVE, RVSP 59  . Coronary artery disease     a. s/p CABG;  b. cath 8/09: LM occluded; RCA 50%; L-LAD ok, Radial-OM ok; bilat renal art stenosis  . Renovascular hypertension   . Renal artery stenosis     a. s/p L RA stent 8/09  . Paroxysmal a-fib     no  coumadin 2/2 GI Blee  . DVT (deep venous thrombosis)   . Pulmonary embolus     s/p IVC filter  . History of gastrointestinal bleeding     no ASA or coumadin  . Contrast dye induced nephropathy   . Orthostatic hypotension    Past Surgical History  Procedure Date  . Appendectomy   . Cholecystectomy   . Abdominal hysterectomy   . Coronary artery bypass graft 09-04-2001    carotid u/s unchnaged 40-50% ICA stenosis 12/1998  . Cataract extraction, bilateral 2001  . Abdominal aortic aneurysm repair 1997  . Nm myoview ltd     neg EF 76%--06/26/2004  . Carotid doppler     12/05 unchanged 9-06  . Cardiac catheterization 09-03-01  . Esophagogastroduodenoscopy     abnormal 2007, esophageal stricture 10-12-03-07 gastritis and major bleef  . Colonoscopy      abnormal 03-13-06  . Vena cava filter placement 07-2007    Dr. Excell Seltzer  . Renal artery doppler 11-17-2008    severe iliac stenosis   History  Substance Use Topics  . Smoking status: Former Smoker -- 1.0 packs/day for 40 years    Types: Cigarettes    Quit date: 04/01/1990  . Smokeless tobacco: Not on file  . Alcohol Use: No   Family History  Problem Relation Age of Onset  . Cancer Mother   . Stroke Father   . Stroke Brother   . Heart attack Brother   . Heart attack Brother   . Heart attack Brother    Allergies  Allergen Reactions  . Codeine   . Meperidine Hcl   . Morphine   . Niacin     REACTION: rash  . Penicillins   . Propoxyphene Hcl   . Sulfonamide Derivatives    Current Outpatient Prescriptions on File Prior to Visit  Medication Sig Dispense Refill  . albuterol (VENTOLIN HFA) 108 (90 BASE) MCG/ACT inhaler Inhale 2 puffs into the lungs every 4 (four) hours as needed.        Marland Kitchen amLODipine (NORVASC) 10 MG tablet Take 1 tablet (10 mg total) by mouth daily.  30 tablet  6  . ferrous sulfate 325 (65 FE) MG tablet Take 325 mg by mouth daily with breakfast.        . FLUoxetine (PROZAC) 10 MG tablet Take 1 tablet (10 mg total) by mouth daily.  30 tablet  5  . isosorbide mononitrate (IMDUR) 30 MG 24 hr tablet Take 1 tablet (30 mg total) by mouth daily.  30 tablet  11  . ketorolac (ACULAR) 0.4 % SOLN Place 1 drop into the right eye 4 (four) times daily.        Marland Kitchen latanoprost (XALATAN) 0.005 % ophthalmic solution Place 1 drop into both eyes daily.        . metoprolol (TOPROL-XL) 50 MG 24 hr tablet Take 25 mg by mouth daily.       . Mometasone Furo-Formoterol Fum (DULERA) 200-5 MCG/ACT AERO Inhale 2 puffs into the lungs 2 (two) times daily.  1 Inhaler  11  . nitroGLYCERIN (NITROSTAT) 0.4 MG SL tablet Place 0.4 mg under the tongue every 5 (five) minutes as needed.        . NON FORMULARY 2 L/min by Nasal route continuous. oxygen       . omeprazole (PRILOSEC) 40 MG capsule Take 40 mg by  mouth daily.        Marland Kitchen tiotropium (SPIRIVA) 18 MCG inhalation capsule Place 18 mcg into inhaler  and inhale daily.        Marland Kitchen ezetimibe-simvastatin (VYTORIN) 10-20 MG per tablet Take 1 tablet by mouth at bedtime.        . hydrALAZINE (APRESOLINE) 10 MG tablet Take 10 mg by mouth 3 (three) times daily.        Marland Kitchen losartan (COZAAR) 100 MG tablet Take 1 tablet (100 mg total) by mouth daily.  30 tablet  11  . megestrol (MEGACE) 400 MG/10ML suspension Take 10 mLs (400 mg total) by mouth daily.  480 mL  5   Demadex 20 mg po bid   Review of Systems Fatigue, SOB intermittently. No active chest pain. Generally feels bad and weak.    Objective:   Physical Exam   Physical Exam  Blood pressure 140/72, pulse 81, temperature 97.6 F (36.4 C), temperature source Oral, weight 166 lb 12.8 oz (75.66 kg), SpO2 98.00%.  GEN: WDWN, NAD, Non-toxic, A & O x 3 HEENT: Atraumatic, Normocephalic. Neck supple. No masses, No LAD. Ears and Nose: No external deformity. CV: irreg irreg, 3/6 sem.  PULM: CTA B, no wheezes, crackles, rhonchi. No retractions. No resp. distress. No accessory muscle use. ABD: S, NT, ND, +BS. No rebound tenderness. No HSM.  EXTR: Tr le Edema NEURO Normal gait.  PSYCH: Normally interactive. Conversant. Not depressed or anxious appearing.  Calm demeanor.        Assessment & Plan:   1. Flu vaccine need  Flu vaccine greater than or equal to 3yo preservative free IM  2. CKD (chronic kidney disease) stage 4, GFR 15-29 ml/min : In this complex case with 2 recent hospitalizations for ARF, most recently with Cr 4.4 at admission, complex diuretic regiment, multiple medical problems, anemia of chronic disease, and significantly worsening renal function over the last 6 months, I am going to consult Nephrology for their assistance. Appreciate their input. Basic metabolic panel, Ambulatory referral to Nephrology  3. Anemia of chronic disease : I suspect that the patient would feel better and could  benefit from Procrit and possibly IV iron as Dr. Myna Hidalgo has suggested in his inpatient consult. We will attempt to assist in arranging an outpatient Hematology follow-up for patient. Ambulatory referral to Hematology  4. Other malaise and fatigue  Basic metabolic panel

## 2010-12-10 NOTE — Patient Instructions (Signed)
REFERRAL: GO THE THE FRONT ROOM AT THE ENTRANCE OF OUR CLINIC, NEAR CHECK IN. ASK FOR MARION. SHE WILL HELP YOU SET UP YOUR REFERRAL. DATE: TIME:  

## 2010-12-11 LAB — BASIC METABOLIC PANEL
Calcium: 8.6 mg/dL (ref 8.4–10.5)
Creatinine, Ser: 1 mg/dL (ref 0.4–1.2)
GFR: 55.16 mL/min — ABNORMAL LOW (ref >60.00)
Glucose, Bld: 88 mg/dL (ref 70–99)
Sodium: 144 mEq/L (ref 135–145)

## 2010-12-13 ENCOUNTER — Ambulatory Visit: Payer: Medicare Other | Admitting: Cardiovascular Disease

## 2010-12-14 ENCOUNTER — Encounter: Payer: Self-pay | Admitting: Cardiovascular Disease

## 2010-12-14 ENCOUNTER — Ambulatory Visit (INDEPENDENT_AMBULATORY_CARE_PROVIDER_SITE_OTHER): Payer: Medicare Other | Admitting: Cardiovascular Disease

## 2010-12-14 DIAGNOSIS — I5022 Chronic systolic (congestive) heart failure: Secondary | ICD-10-CM

## 2010-12-14 DIAGNOSIS — I2510 Atherosclerotic heart disease of native coronary artery without angina pectoris: Secondary | ICD-10-CM

## 2010-12-14 DIAGNOSIS — I10 Essential (primary) hypertension: Secondary | ICD-10-CM

## 2010-12-14 NOTE — Assessment & Plan Note (Signed)
Blood pressure is stable on current medical regimen. She has not had major problems with orthostatic symptoms of late. Will continue current program.

## 2010-12-14 NOTE — Assessment & Plan Note (Signed)
The patient is stable without anginal symptoms. She is off of antiplatelet drugs and anticoagulants because of chronic bleeding.

## 2010-12-14 NOTE — Patient Instructions (Signed)
Your physician recommends that you schedule a follow-up appointment in: 8 weeks.   Weigh yourself today when you get home and record.  If your weight increases 3 pounds from this weight increase torsemide to 40 mg twice daily until back to this weight.  Otherwise stay on torsemide 20 mg twice daily.

## 2010-12-14 NOTE — Assessment & Plan Note (Signed)
The patient appears stable at present. She does have mild ankle edema. Her kidney function is as good as I cemented with a creatinine of 1.0. Her creatinine has been greater than 4 in the recent past. She is currently taking torsemide 20 mg twice daily. I've asked her to weigh herself when she gets home and if she gains 3 additional pounds then she should increase her torsemide to 40 mg twice daily until back to her baseline weight which will be recorded today. I reviewed her recent office notes and she really does not gain much benefit in her breathing even with significant volume removal. She had a 20 pound weight loss over the period of one month, most of which was fluid. Despite that, she remained short of breath without significant change in her breathing. I think there is more harm to be done from aggressive diuresis then there is benefit. We probably should limit diuretics as much as possible and I think a maintenance torsemide dose of 20 mg twice daily.  We discussed leg elevation and salt avoidance. I would like to see her back in 8 weeks for followup.

## 2010-12-14 NOTE — Progress Notes (Signed)
HPI:  Donna Reese returns for followup today. She is a 77 year old woman with multiple cardiac problems. Diastolic heart failure has been her biggest issue of late. She has coronary artery disease status post CABG, peripheral arterial disease status post aortobifemoral bypass, paroxysmal atrial fibrillation, chronic GI bleeding no longer a candidate for anticoagulation, renal artery stenosis, and IVC filter. She also has a history of marked autonomic dysfunction and orthostasis. The patient has developed acute renal failure now on 2 separate occasions. These have both been related to diuretic therapy.  She complains today of increasing ankle swelling. There really is been no change in her breathing. Her main complaint is generalized fatigue and weakness. She denies chest pain or pressure. She denies orthopnea or PND.  Outpatient Encounter Prescriptions as of 12/14/2010  Medication Sig Dispense Refill  . albuterol (VENTOLIN HFA) 108 (90 BASE) MCG/ACT inhaler Inhale 2 puffs into the lungs every 4 (four) hours as needed.        Marland Kitchen amLODipine (NORVASC) 10 MG tablet Take 1 tablet (10 mg total) by mouth daily.  30 tablet  6  . ezetimibe-simvastatin (VYTORIN) 10-20 MG per tablet Take 1 tablet by mouth at bedtime.        . ferrous sulfate 325 (65 FE) MG tablet Take 325 mg by mouth daily with breakfast.        . FLUoxetine (PROZAC) 10 MG tablet Take 1 tablet (10 mg total) by mouth daily.  30 tablet  5  . hydrALAZINE (APRESOLINE) 10 MG tablet Take 10 mg by mouth 3 (three) times daily.        . isosorbide mononitrate (IMDUR) 30 MG 24 hr tablet Take 1 tablet (30 mg total) by mouth daily.  30 tablet  11  . ketorolac (ACULAR) 0.4 % SOLN Place 1 drop into the right eye 4 (four) times daily.        Marland Kitchen latanoprost (XALATAN) 0.005 % ophthalmic solution Place 1 drop into both eyes daily.        . megestrol (MEGACE) 400 MG/10ML suspension Take 10 mLs (400 mg total) by mouth daily.  480 mL  5  . metoprolol (TOPROL-XL) 50 MG 24 hr  tablet Take 25 mg by mouth daily.       . Mometasone Furo-Formoterol Fum (DULERA) 200-5 MCG/ACT AERO Inhale 2 puffs into the lungs 2 (two) times daily.  1 Inhaler  11  . nitroGLYCERIN (NITROSTAT) 0.4 MG SL tablet Place 0.4 mg under the tongue every 5 (five) minutes as needed.        . NON FORMULARY 2 L/min by Nasal route continuous. oxygen       . omeprazole (PRILOSEC) 40 MG capsule Take 40 mg by mouth daily.        Marland Kitchen tiotropium (SPIRIVA) 18 MCG inhalation capsule Place 18 mcg into inhaler and inhale daily.        Marland Kitchen torsemide (DEMADEX) 20 MG tablet Take 20 mg by mouth 2 (two) times daily.        Marland Kitchen losartan (COZAAR) 100 MG tablet Take 1 tablet (100 mg total) by mouth daily.  30 tablet  11    Allergies  Allergen Reactions  . Codeine   . Meperidine Hcl   . Morphine   . Niacin     REACTION: rash  . Penicillins   . Propoxyphene Hcl   . Sulfonamide Derivatives     Past Medical History  Diagnosis Date  . Diabetes mellitus   . COPD (chronic obstructive pulmonary disease)   .  Diverticulosis   . Osteopenia   . PUD (peptic ulcer disease)   . Peripheral arterial disease   . Hypertension   . Hyperlipidemia   . Diastolic heart failure     a. echo 9/11: EF 60-65%, mild LVH, grade 2 diast dysfxn, AV mean gradient 9 mmHg, mild MR, RVE, RVSP 59  . Coronary artery disease     a. s/p CABG;  b. cath 8/09: LM occluded; RCA 50%; L-LAD ok, Radial-OM ok; bilat renal art stenosis  . Renovascular hypertension   . Renal artery stenosis     a. s/p L RA stent 8/09  . Paroxysmal a-fib     no coumadin 2/2 GI Blee  . DVT (deep venous thrombosis)   . Pulmonary embolus     s/p IVC filter  . History of gastrointestinal bleeding     no ASA or coumadin  . Contrast dye induced nephropathy   . Orthostatic hypotension     ROS: Negative except as per HPI  BP 144/60  Pulse 80  Ht 5\' 3"  (1.6 m)  Wt 168 lb 6.4 oz (76.386 kg)  BMI 29.83 kg/m2  PHYSICAL EXAM: Pt is alert and oriented, elderly woman in  NAD HEENT: normal Neck: JVP - normal, carotids 2+= with bilateral bruits Lungs: CTA bilaterally CV: RRR without murmur or gallop Abd: soft, NT, Positive BS, no hepatomegaly Ext: 1+ ankle edema bilaterally, distal pulses intact and equal Skin: warm/dry no rash  ASSESSMENT AND PLAN:

## 2010-12-17 ENCOUNTER — Telehealth: Payer: Self-pay | Admitting: Family Medicine

## 2010-12-17 NOTE — Telephone Encounter (Signed)
Donna Reese from [facility] office called to say that they will be making her an appt very soon , they are working on that now. Dr Arrie Aran also asked Donna Reese to relay to you to tell Donna Reese not to be taking Cozaar or Aces or Arbs at all!

## 2010-12-17 NOTE — Progress Notes (Signed)
Donna Reese, I definitely agree. Recent renal function and overdiuresis has been a big issue recently.

## 2010-12-18 NOTE — Telephone Encounter (Signed)
Can you call [name] and ask her to stop taking her losartan tablets? (The kidney Dr.'s suggestion, which I think is a good idea with her recent probs and visit to the hospital)  Keep off of them unless the kidney Dr. Aundria Rud you otherwise   (please update med list to reflect)

## 2010-12-18 NOTE — Telephone Encounter (Signed)
Patient advised and medication removed from the medication list

## 2010-12-19 ENCOUNTER — Other Ambulatory Visit: Payer: Self-pay | Admitting: Family Medicine

## 2010-12-19 ENCOUNTER — Encounter (HOSPITAL_BASED_OUTPATIENT_CLINIC_OR_DEPARTMENT_OTHER): Payer: Medicare Other | Admitting: Oncology

## 2010-12-19 ENCOUNTER — Other Ambulatory Visit: Payer: Self-pay | Admitting: Oncology

## 2010-12-19 ENCOUNTER — Encounter: Payer: Medicare Other | Admitting: Oncology

## 2010-12-19 ENCOUNTER — Ambulatory Visit: Payer: Medicare Other | Admitting: Family Medicine

## 2010-12-19 DIAGNOSIS — D649 Anemia, unspecified: Secondary | ICD-10-CM

## 2010-12-19 DIAGNOSIS — D61818 Other pancytopenia: Secondary | ICD-10-CM

## 2010-12-19 LAB — VITAMIN B12: Vitamin B-12: 141 — ABNORMAL LOW (ref 211–911)

## 2010-12-19 LAB — CROSSMATCH

## 2010-12-19 LAB — COMPREHENSIVE METABOLIC PANEL
ALT: 12
AST: 13
CO2: 31
Calcium: 9.3
Chloride: 100
Creatinine, Ser: 0.82
GFR calc non Af Amer: >60
Glucose, Bld: 128 — ABNORMAL HIGH
Sodium: 141
Total Bilirubin: 0.6

## 2010-12-19 LAB — CBC
HCT: 26.3 — ABNORMAL LOW
HCT: 27.7 — ABNORMAL LOW
HCT: 28 — ABNORMAL LOW
HCT: 28 — ABNORMAL LOW
Hemoglobin: 7.5 — CL
Hemoglobin: 8.5 — ABNORMAL LOW
Hemoglobin: 8.9 — ABNORMAL LOW
Hemoglobin: 9 — ABNORMAL LOW
Hemoglobin: 9.1 — ABNORMAL LOW
MCHC: 32
MCHC: 32.1
MCHC: 32.3
MCHC: 32.7
MCV: 79.5
MCV: 79.5
MCV: 79.6
MCV: 79.7
MCV: 80
MCV: 80.2
Platelets: 168
Platelets: 201
Platelets: 202
RBC: 2.99 — ABNORMAL LOW
RBC: 3.3 — ABNORMAL LOW
RBC: 3.42 — ABNORMAL LOW
RDW: 16.6 — ABNORMAL HIGH
RDW: 17.2 — ABNORMAL HIGH
WBC: 5.9
WBC: 6
WBC: 6
WBC: 6.1
WBC: 6.5

## 2010-12-19 LAB — BASIC METABOLIC PANEL
BUN: 23
BUN: 26 — ABNORMAL HIGH
CO2: 33 — ABNORMAL HIGH
CO2: 34 — ABNORMAL HIGH
CO2: 35 — ABNORMAL HIGH
Chloride: 103
Chloride: 97
Chloride: 97
Chloride: 98
Chloride: 99
Creatinine, Ser: 1.06
GFR calc Af Amer: >60
GFR calc non Af Amer: >60
Glucose, Bld: 105 — ABNORMAL HIGH
Glucose, Bld: 117 — ABNORMAL HIGH
Glucose, Bld: 97
Glucose, Bld: 98
Potassium: 3.5
Potassium: 3.7
Potassium: 3.8
Sodium: 141
Sodium: 141
Sodium: 144

## 2010-12-19 LAB — B-NATRIURETIC PEPTIDE (CONVERTED LAB): Pro B Natriuretic peptide (BNP): 548 — ABNORMAL HIGH

## 2010-12-19 LAB — CBC & DIFF AND RETIC
Basophils Absolute: 0 10*3/uL (ref 0.0–0.1)
HCT: 30.8 % — ABNORMAL LOW (ref 34.8–46.6)
HGB: 10.1 g/dL — ABNORMAL LOW (ref 11.6–15.9)
Immature Retic Fract: 8.6 % (ref 1.60–10.00)
MONO#: 0.3 10*3/uL (ref 0.1–0.9)
NEUT#: 3.3 10*3/uL (ref 1.5–6.5)
NEUT%: 70.4 % (ref 38.4–76.8)
RDW: 15.9 % — ABNORMAL HIGH (ref 11.2–14.5)
Retic Ct Abs: 34.94 10*3/uL (ref 33.70–90.70)
WBC: 4.8 10*3/uL (ref 3.9–10.3)
lymph#: 1 10*3/uL (ref 0.9–3.3)

## 2010-12-19 LAB — DIFFERENTIAL
Basophils Absolute: 0
Eosinophils Absolute: 0
Eosinophils Absolute: 0.1
Eosinophils Relative: 1
Eosinophils Relative: 2
Lymphocytes Relative: 19
Lymphs Abs: 0.6 — ABNORMAL LOW
Lymphs Abs: 1.2
Monocytes Relative: 9
Neutrophils Relative %: 85 — ABNORMAL HIGH

## 2010-12-19 LAB — URIC ACID: Uric Acid, Serum: 9 — ABNORMAL HIGH

## 2010-12-19 LAB — CK TOTAL AND CKMB (NOT AT ARMC)
CK, MB: 3.9
Total CK: 62

## 2010-12-19 LAB — D-DIMER, QUANTITATIVE: D-Dimer, Quant: 0.52 — ABNORMAL HIGH

## 2010-12-19 LAB — CHCC SMEAR

## 2010-12-19 LAB — PROTIME-INR
Prothrombin Time: 30.7 — ABNORMAL HIGH
Prothrombin Time: 31.3 — ABNORMAL HIGH

## 2010-12-19 LAB — MORPHOLOGY: PLT EST: NORMAL

## 2010-12-19 LAB — ABO/RH: ABO/RH(D): B POS

## 2010-12-19 LAB — TROPONIN I: Troponin I: 0.21 — ABNORMAL HIGH

## 2010-12-20 LAB — COMPREHENSIVE METABOLIC PANEL
Albumin: 3.5 g/dL (ref 3.5–5.2)
Alkaline Phosphatase: 99 U/L (ref 39–117)
Calcium: 8.3 mg/dL — ABNORMAL LOW (ref 8.4–10.5)
Chloride: 106 mEq/L (ref 96–112)
Creatinine, Ser: 1.09 mg/dL (ref 0.50–1.10)
Sodium: 142 mEq/L (ref 135–145)

## 2010-12-20 LAB — LACTATE DEHYDROGENASE: LDH: 203 U/L (ref 94–250)

## 2010-12-20 LAB — SEDIMENTATION RATE: Sed Rate: 8 mm/hr (ref 0–22)

## 2010-12-20 LAB — ANA: Anti Nuclear Antibody(ANA): NEGATIVE

## 2010-12-20 IMAGING — CR DG CHEST 2V
2 series · 2 of 2 positions shown · non-contrast
Comparison: 12/16/2009

CLINICAL DATA: Chronic systolic heart failure.  Swelling.

CHEST - 2 VIEW

[view not recorded (1 of 2)]
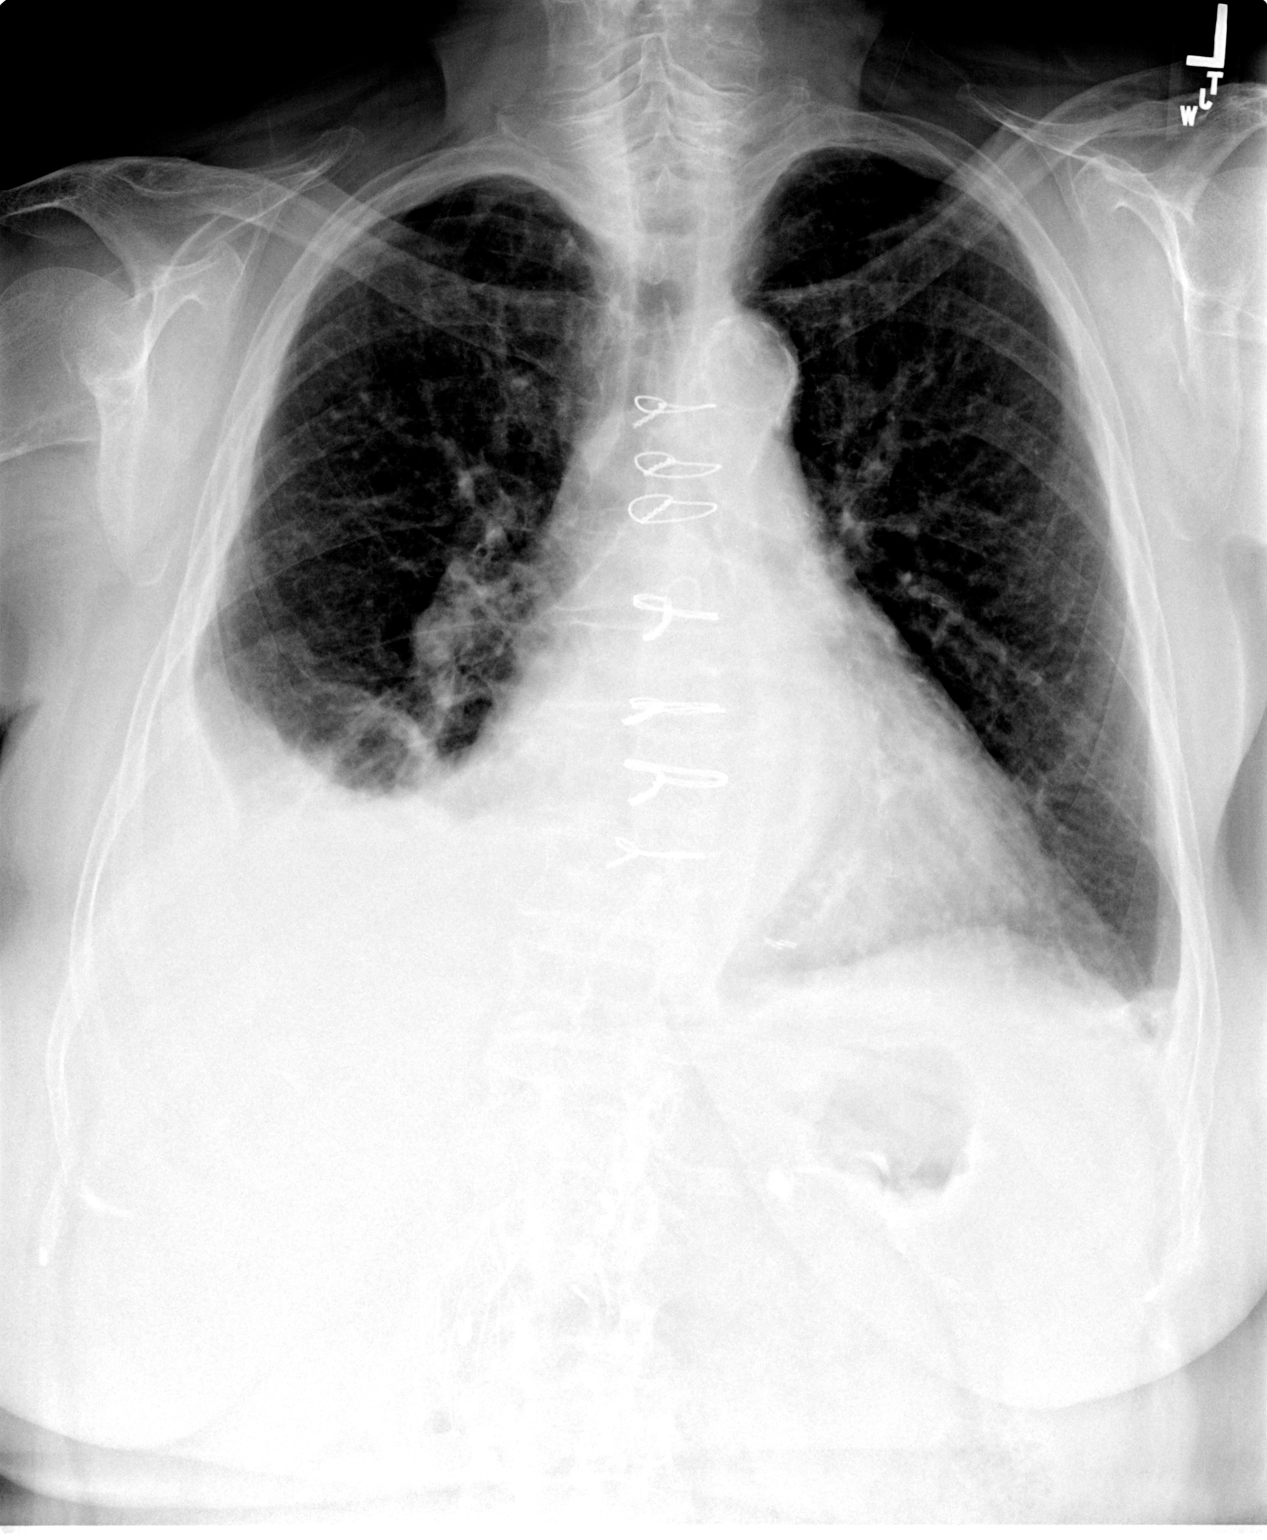

[view not recorded (2 of 2)]
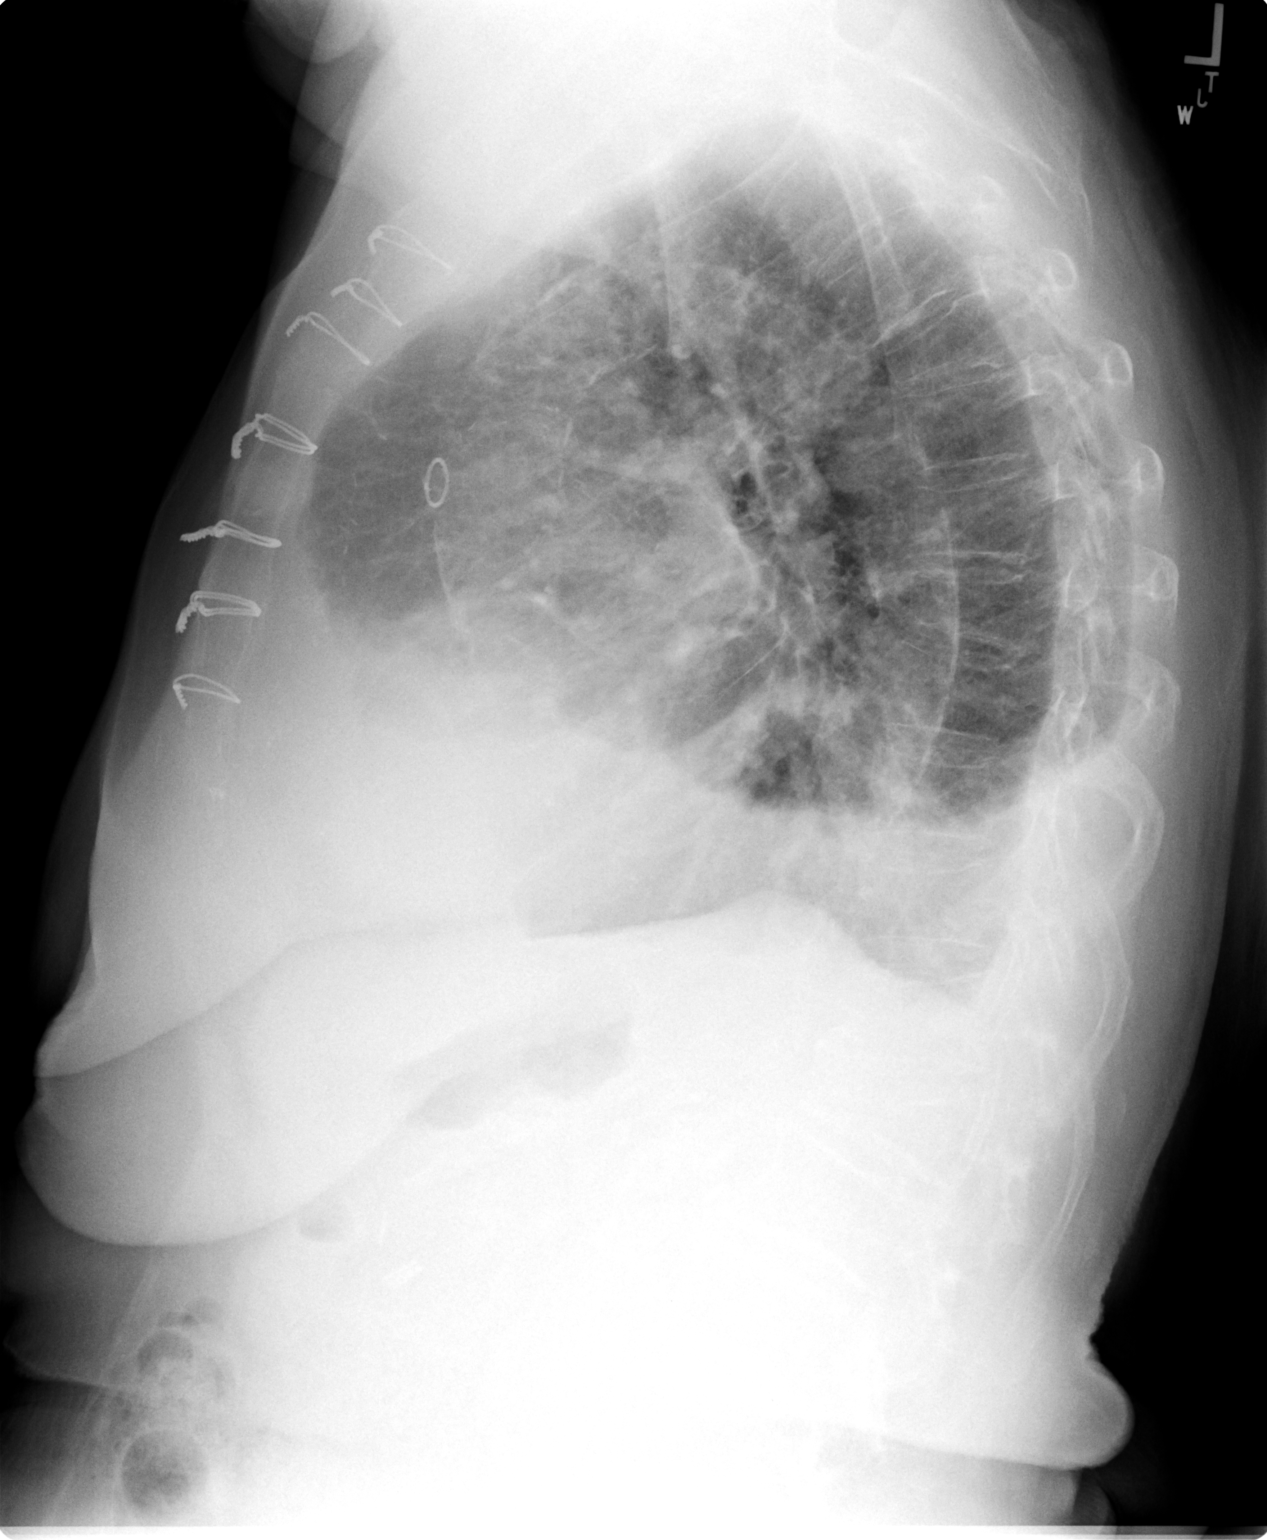

[2 of 2 positions shown; findings below may reference images not displayed]

FINDINGS: Mild hyperinflation. Prior median sternotomy.  Mild upper
and mid thoracic compression deformities are similar.  There is a
thoracolumbar compression deformity which is moderate and
unchanged.

Midline trachea.  Mild cardiomegaly.  Small right greater than left
pleural effusions.  The right-sided effusion is enlarged since the
prior.  Mild pulmonary venous congestion.  Persistent mild left
base atelectasis.  Right base air space disease.
IMPRESSION: 1.  Cardiomegaly with mild pulmonary venous congestion.
2.  Increased right and similar left pleural effusion.
3.  Left base atelectasis with right base air space disease.  Most
likely compressive atelectasis.  Concurrent infection cannot be
excluded.

## 2010-12-20 IMAGING — CR DG CHEST 2V
2 series · 2 of 2 positions shown · non-contrast
Comparison: 01/25/2010

CLINICAL DATA: Shortness of breath.  Hypertension.  COPD.

CHEST - 2 VIEW

[w chest pa]
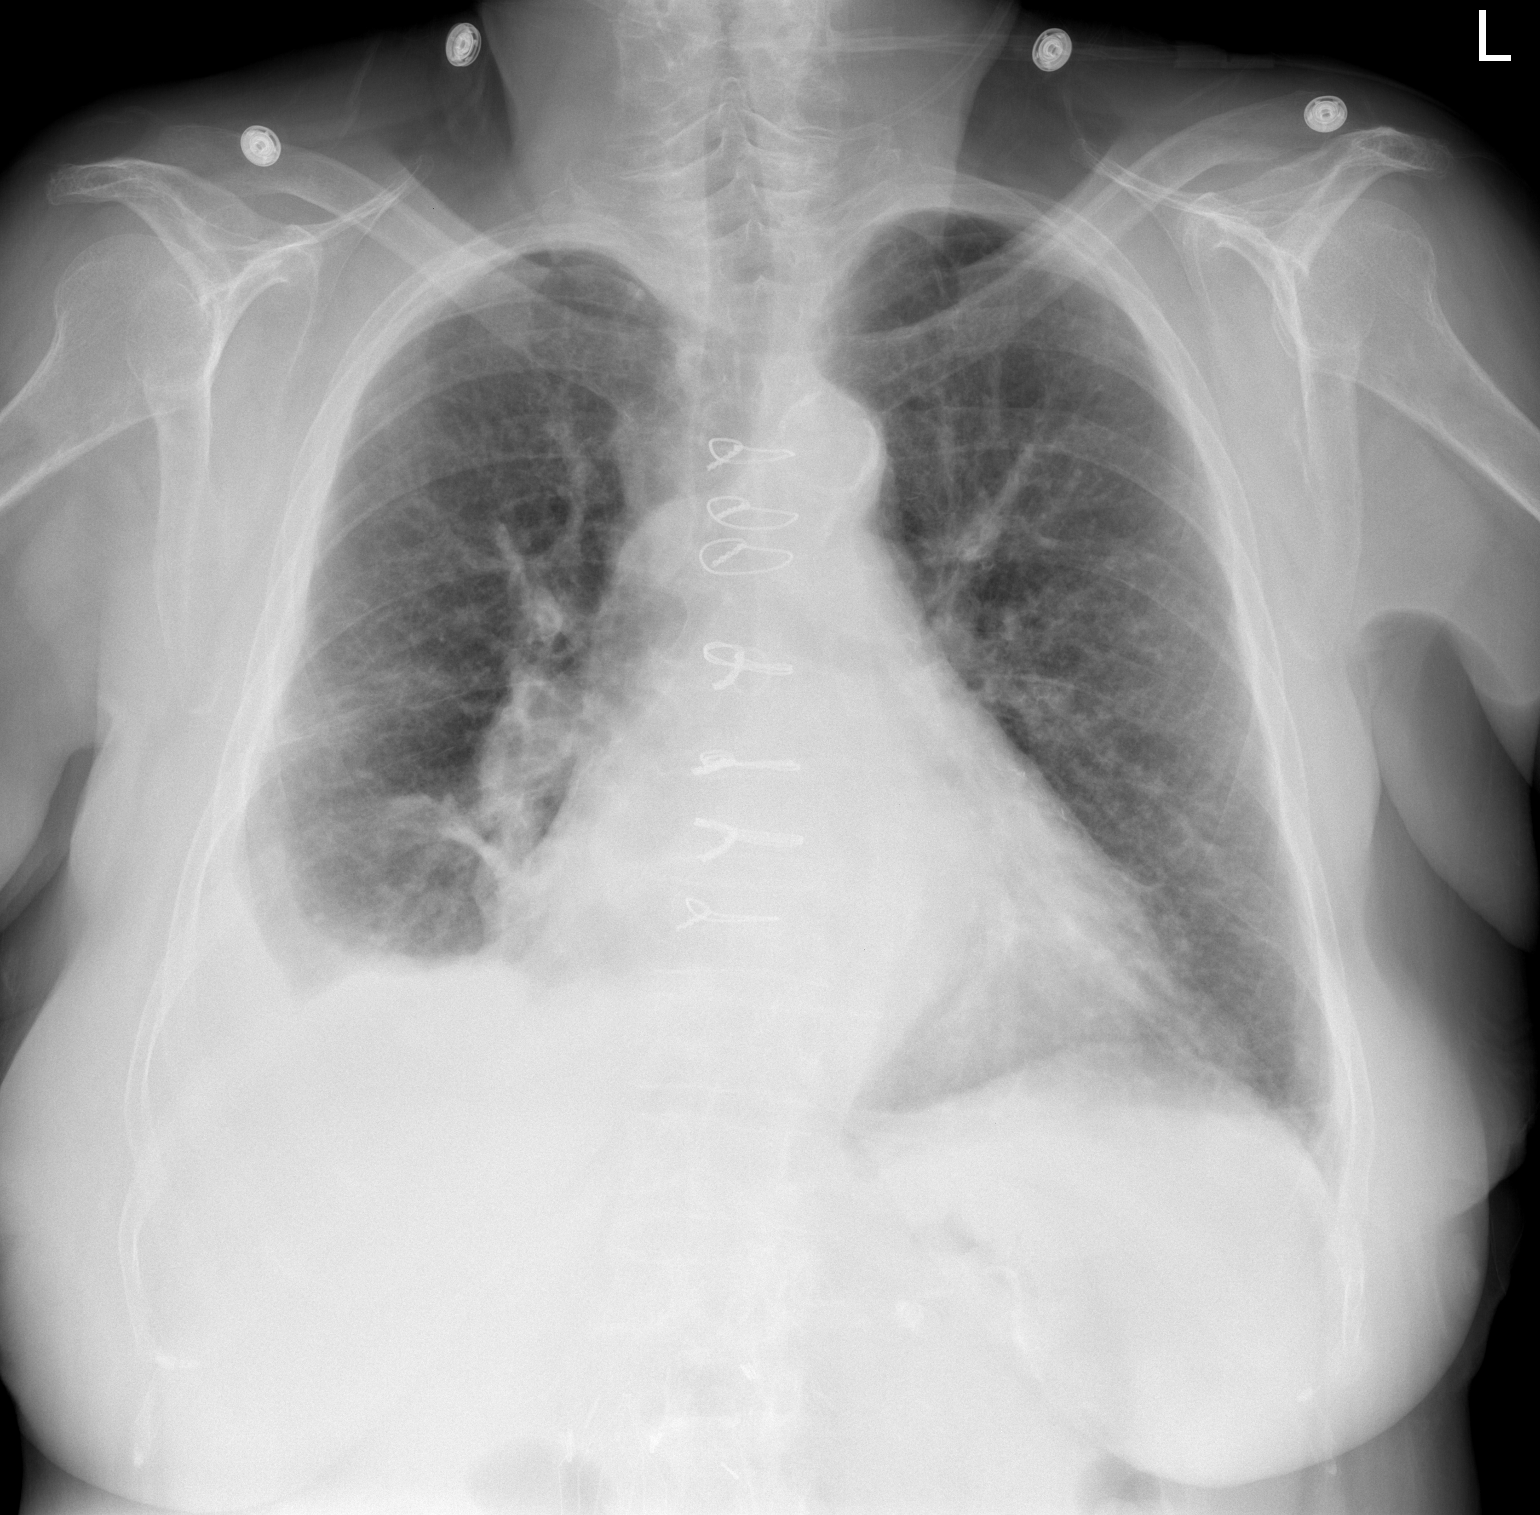

[w chest lat *]
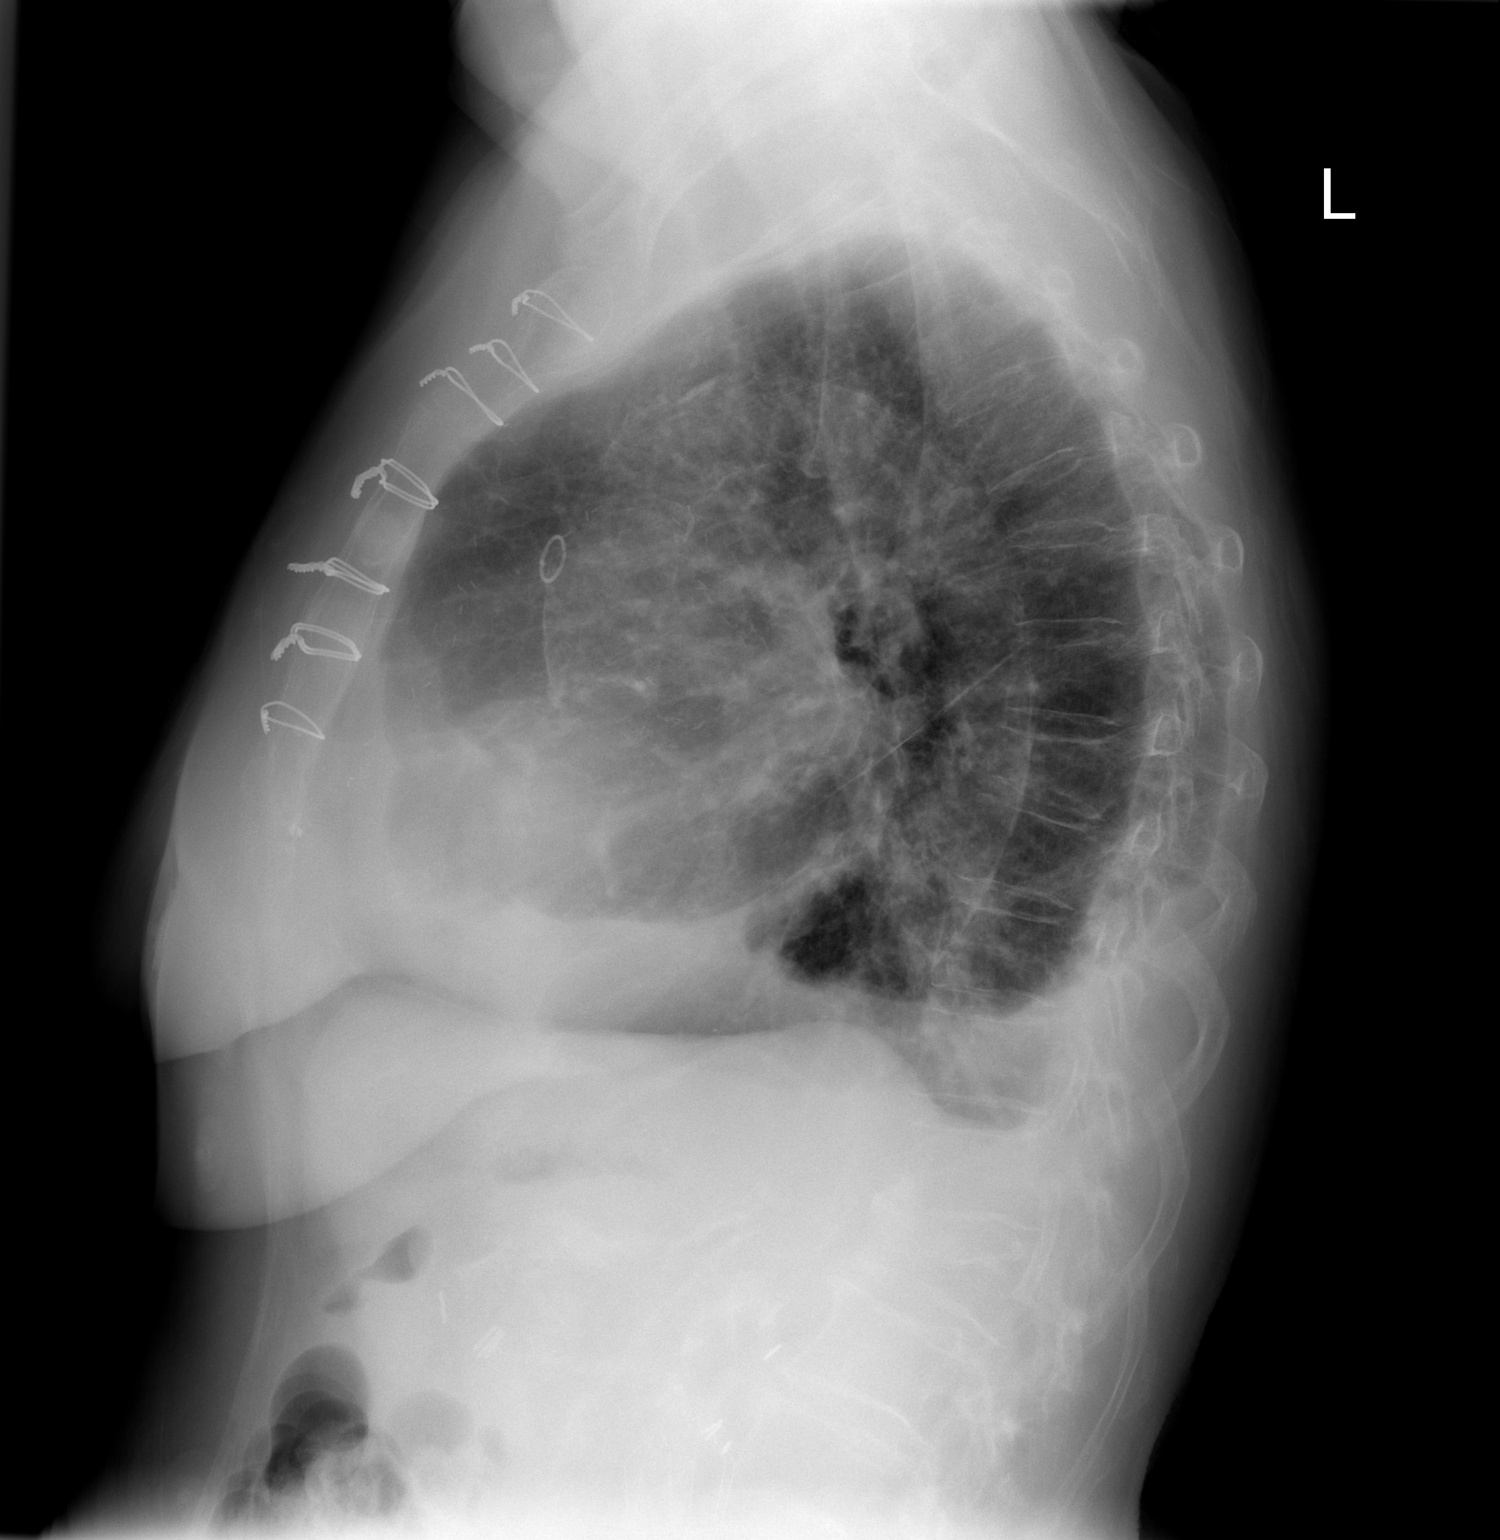

[2 of 2 positions shown; findings below may reference images not displayed]

FINDINGS: Stable moderate right and small left pleural effusions
noted with associated passive atelectasis.  Cardiomegaly is present
with interstitial prominence the lungs, unchanged.

Prior CABG noted.  Atherosclerotic calcification of the aortic arch
is present.

An IVC filter is noted.  Unchanged thoracolumbar compression
fractures noted.
IMPRESSION: 1.  Stable appearance of cardiomegaly with borderline interstitial
edema and bilateral pleural effusions.

## 2010-12-21 ENCOUNTER — Other Ambulatory Visit: Payer: Self-pay | Admitting: Cardiovascular Disease

## 2010-12-21 IMAGING — CR DG CHEST 2V
2 series · 2 of 2 positions shown · non-contrast
Comparison: 01/25/2010

CLINICAL DATA: CHF, short of breath

CHEST - 2 VIEW

[w chest pa]
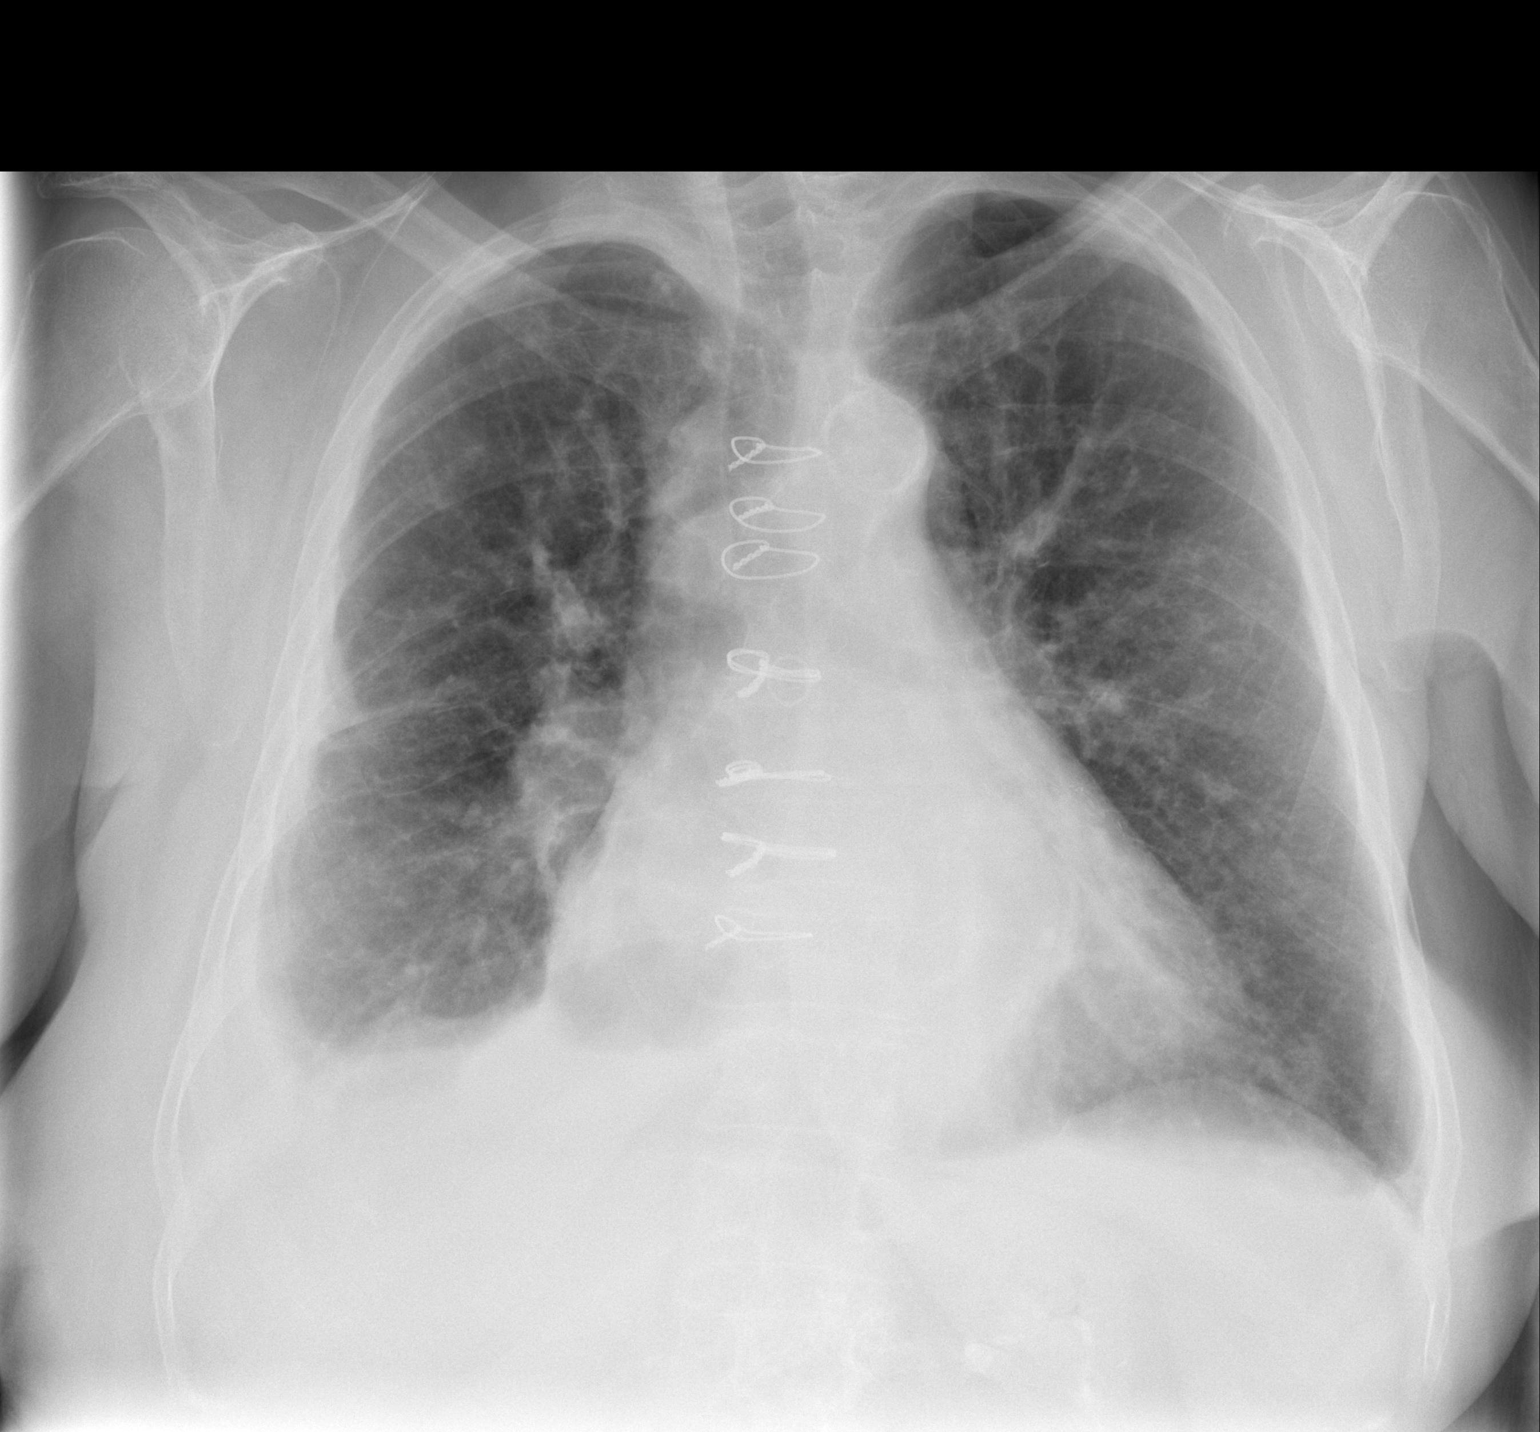

[w chest lat]
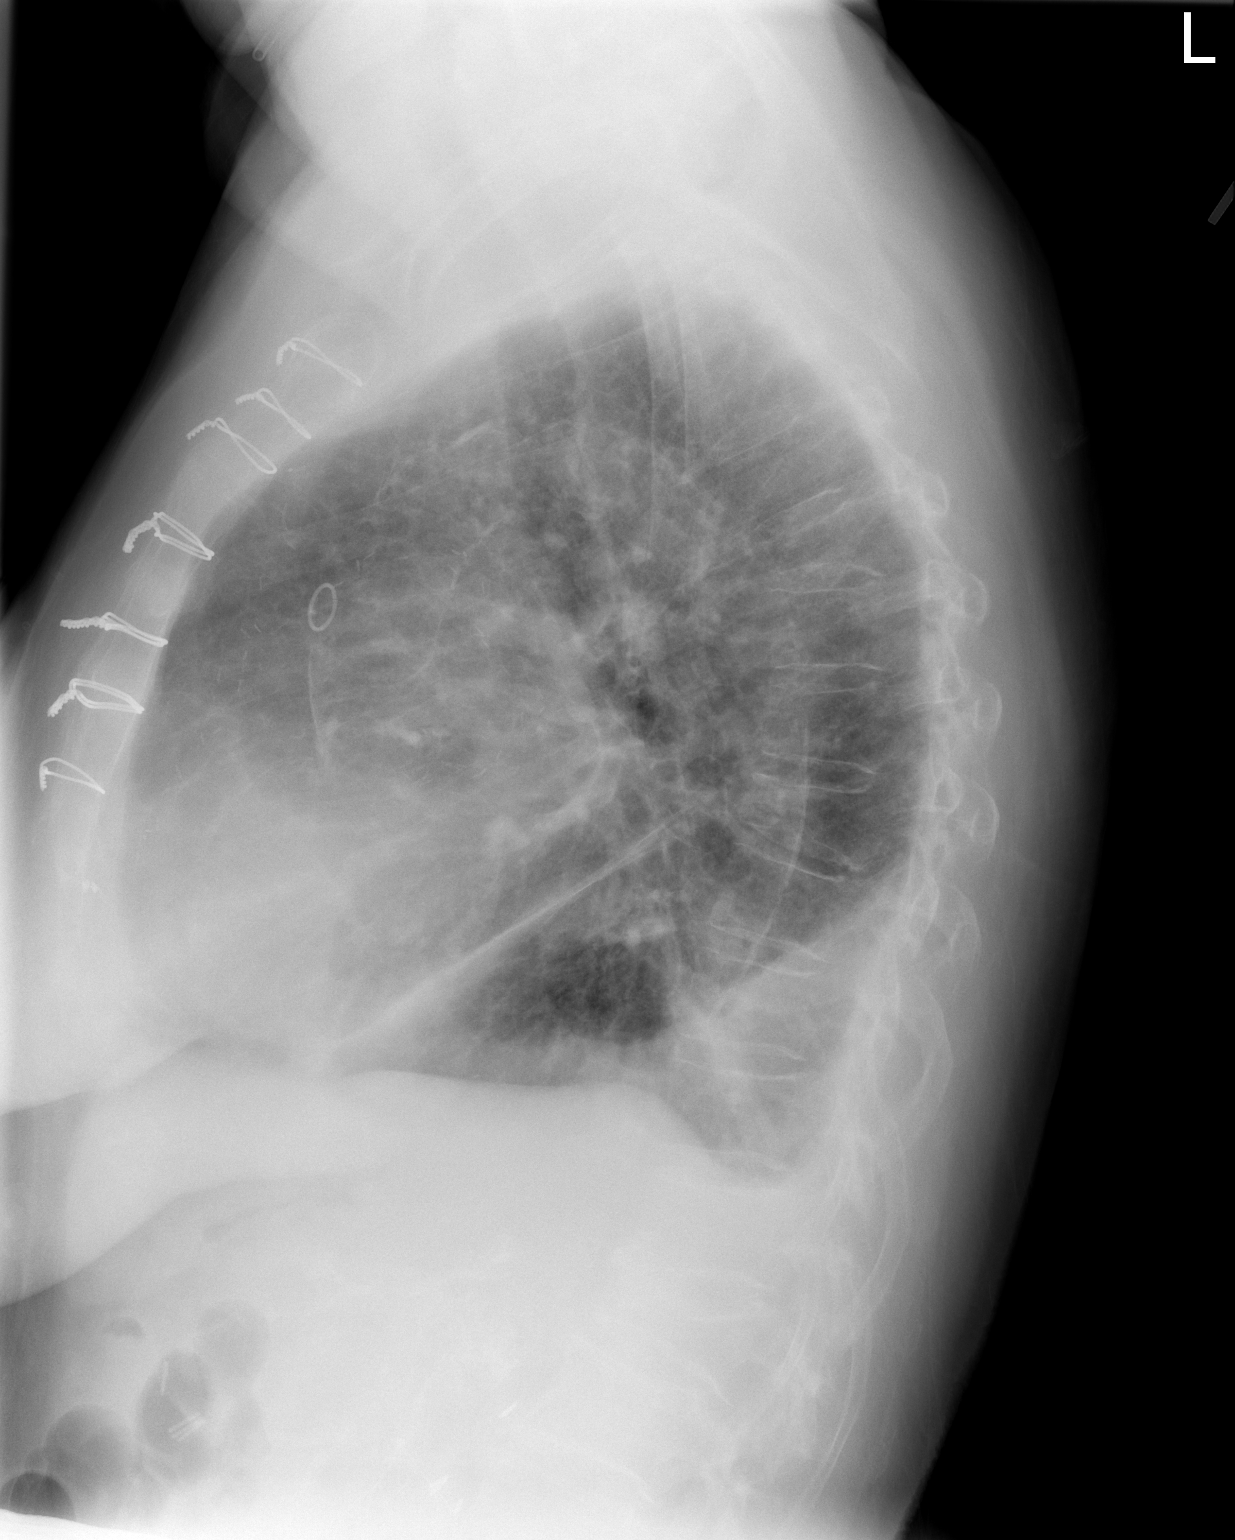

[2 of 2 positions shown; findings below may reference images not displayed]

FINDINGS: Cardiac enlargement with vascular congestion.  Bilateral
pleural effusions are unchanged.  Right lower lobe airspace disease
is slightly improved.  Underlying COPD.
IMPRESSION: Pulmonary vascular congestion with bilateral effusions, compatible
with fluid overload.  No significant change.

## 2010-12-24 ENCOUNTER — Emergency Department (HOSPITAL_COMMUNITY)
Admission: EM | Admit: 2010-12-24 | Discharge: 2010-12-24 | Disposition: A | Discharge status: Home / Self Care | Payer: Medicare Other | Attending: Emergency Medicine | Admitting: Emergency Medicine

## 2010-12-24 ENCOUNTER — Emergency Department (HOSPITAL_COMMUNITY): Payer: Medicare Other

## 2010-12-24 DIAGNOSIS — E119 Type 2 diabetes mellitus without complications: Secondary | ICD-10-CM | POA: Insufficient documentation

## 2010-12-24 DIAGNOSIS — Z951 Presence of aortocoronary bypass graft: Secondary | ICD-10-CM | POA: Insufficient documentation

## 2010-12-24 DIAGNOSIS — I10 Essential (primary) hypertension: Secondary | ICD-10-CM | POA: Insufficient documentation

## 2010-12-24 DIAGNOSIS — M542 Cervicalgia: Secondary | ICD-10-CM | POA: Insufficient documentation

## 2010-12-24 DIAGNOSIS — M62838 Other muscle spasm: Secondary | ICD-10-CM | POA: Insufficient documentation

## 2010-12-24 DIAGNOSIS — I4891 Unspecified atrial fibrillation: Secondary | ICD-10-CM | POA: Insufficient documentation

## 2010-12-24 DIAGNOSIS — I2510 Atherosclerotic heart disease of native coronary artery without angina pectoris: Secondary | ICD-10-CM | POA: Insufficient documentation

## 2010-12-24 LAB — CBC
HCT: 24.2 — ABNORMAL LOW
HCT: 25 — ABNORMAL LOW
HCT: 25.9 — ABNORMAL LOW
HCT: 29.7 — ABNORMAL LOW
Hemoglobin: 10 — ABNORMAL LOW
Hemoglobin: 8 — ABNORMAL LOW
MCHC: 33.7
MCHC: 33.7
MCV: 86.9
MCV: 87
MCV: 87.4
MCV: 87.4
MCV: 87.7
Platelets: 153
Platelets: 158
Platelets: 158
Platelets: 169
Platelets: 178
RBC: 2.76 — ABNORMAL LOW
RBC: 2.79 — ABNORMAL LOW
RBC: 2.88 — ABNORMAL LOW
RBC: 3.4 — ABNORMAL LOW
RDW: 18.1 — ABNORMAL HIGH
RDW: 22 — ABNORMAL HIGH
WBC: 6.1
WBC: 7.6
WBC: 7.6
WBC: 7.6

## 2010-12-24 LAB — COMPREHENSIVE METABOLIC PANEL
ALT: 11
AST: 15
Alkaline Phosphatase: 70
CO2: 27
Calcium: 8.3 — ABNORMAL LOW
GFR calc Af Amer: 58 — ABNORMAL LOW
GFR calc non Af Amer: 48 — ABNORMAL LOW
Glucose, Bld: 111 — ABNORMAL HIGH
Potassium: 3.4 — ABNORMAL LOW
Sodium: 138
Total Protein: 5.4 — ABNORMAL LOW

## 2010-12-24 LAB — URINALYSIS, ROUTINE W REFLEX MICROSCOPIC
Bilirubin Urine: NEGATIVE
Glucose, UA: NEGATIVE
Hgb urine dipstick: NEGATIVE
Ketones, ur: NEGATIVE
Protein, ur: NEGATIVE
pH: 5.5

## 2010-12-24 LAB — BASIC METABOLIC PANEL
BUN: 38 — ABNORMAL HIGH
CO2: 31
CO2: 31
Chloride: 102
Chloride: 103
Creatinine, Ser: 0.99
GFR calc Af Amer: >60
GFR calc Af Amer: >60
Potassium: 3.4 — ABNORMAL LOW
Potassium: 4.5
Sodium: 140

## 2010-12-24 LAB — HEMOGLOBIN A1C
Hgb A1c MFr Bld: 5.2
Mean Plasma Glucose: 108

## 2010-12-24 LAB — CULTURE, BLOOD (ROUTINE X 2)

## 2010-12-24 LAB — CROSSMATCH
ABO/RH(D): B POS
ABO/RH(D): B POS
Antibody Screen: NEGATIVE
Antibody Screen: NEGATIVE

## 2010-12-24 LAB — VITAMIN B12: Vitamin B-12: 402 (ref 211–911)

## 2010-12-24 LAB — DIFFERENTIAL
Basophils Absolute: 0
Lymphocytes Relative: 16
Monocytes Relative: 6
Neutrophils Relative %: 77

## 2010-12-24 LAB — FERRITIN: Ferritin: 24 (ref 10–291)

## 2010-12-24 LAB — URINE CULTURE

## 2010-12-24 LAB — CARDIAC PANEL(CRET KIN+CKTOT+MB+TROPI)
CK, MB: 1.1
CK, MB: 1.2
Relative Index: INVALID
Troponin I: 0.09 — ABNORMAL HIGH

## 2010-12-24 LAB — POCT I-STAT, CHEM 8
Calcium, Ion: 1.09 — ABNORMAL LOW
HCT: 21 — ABNORMAL LOW
TCO2: 29

## 2010-12-24 LAB — CK TOTAL AND CKMB (NOT AT ARMC): CK, MB: 1.3

## 2010-12-24 LAB — HEMOGLOBIN AND HEMATOCRIT, BLOOD
HCT: 25.9 — ABNORMAL LOW
Hemoglobin: 8.6 — ABNORMAL LOW
Hemoglobin: 9.2 — ABNORMAL LOW

## 2010-12-24 LAB — IRON AND TIBC
Iron: 42
Saturation Ratios: 15 — ABNORMAL LOW
UIBC: 242

## 2010-12-24 LAB — URINE MICROSCOPIC-ADD ON

## 2010-12-24 LAB — POCT CARDIAC MARKERS
Myoglobin, poc: 63.9
Troponin i, poc: <0.05

## 2010-12-24 LAB — B-NATRIURETIC PEPTIDE (CONVERTED LAB): Pro B Natriuretic peptide (BNP): 500 — ABNORMAL HIGH

## 2010-12-25 ENCOUNTER — Other Ambulatory Visit: Payer: Self-pay | Admitting: *Deleted

## 2010-12-25 LAB — BASIC METABOLIC PANEL
BUN: 12
Calcium: 8.6
Chloride: 103
Chloride: 99
Creatinine, Ser: 0.88
GFR calc Af Amer: >60
GFR calc Af Amer: >60
GFR calc non Af Amer: 60 — ABNORMAL LOW
Potassium: 3.5
Sodium: 143

## 2010-12-25 LAB — CROSSMATCH: ABO/RH(D): B POS

## 2010-12-25 LAB — CBC
Hemoglobin: 10.7 — ABNORMAL LOW
MCV: 88.2
RBC: 3.63 — ABNORMAL LOW
WBC: 5

## 2010-12-25 LAB — HEMATOCRIT: HCT: 26.2 — ABNORMAL LOW

## 2010-12-25 LAB — APTT: aPTT: 34

## 2010-12-25 LAB — CARDIAC PANEL(CRET KIN+CKTOT+MB+TROPI)
Relative Index: INVALID
Total CK: 71
Troponin I: 0.05

## 2010-12-25 LAB — B-NATRIURETIC PEPTIDE (CONVERTED LAB): Pro B Natriuretic peptide (BNP): 477 — ABNORMAL HIGH

## 2010-12-25 LAB — PROTIME-INR: Prothrombin Time: 15.2

## 2010-12-25 LAB — HEMOGLOBIN AND HEMATOCRIT, BLOOD: HCT: 28 — ABNORMAL LOW

## 2010-12-25 MED ORDER — NITROGLYCERIN 0.4 MG SL SUBL: 0.4000 mg | SUBLINGUAL_TABLET | SUBLINGUAL | Status: DC | PRN

## 2010-12-26 ENCOUNTER — Encounter: Payer: Self-pay | Admitting: Family Medicine

## 2010-12-26 ENCOUNTER — Ambulatory Visit (INDEPENDENT_AMBULATORY_CARE_PROVIDER_SITE_OTHER): Payer: Medicare Other | Admitting: Family Medicine

## 2010-12-26 VITALS — BP 160/60 | HR 80 | Temp 98.4°F | Ht 65.0 in | Wt 170.4 lb

## 2010-12-26 DIAGNOSIS — B029 Zoster without complications: Secondary | ICD-10-CM

## 2010-12-26 DIAGNOSIS — R609 Edema, unspecified: Secondary | ICD-10-CM

## 2010-12-26 DIAGNOSIS — R0602 Shortness of breath: Secondary | ICD-10-CM

## 2010-12-26 DIAGNOSIS — N184 Chronic kidney disease, stage 4 (severe): Secondary | ICD-10-CM

## 2010-12-26 DIAGNOSIS — D638 Anemia in other chronic diseases classified elsewhere: Secondary | ICD-10-CM

## 2010-12-26 DIAGNOSIS — M542 Cervicalgia: Secondary | ICD-10-CM

## 2010-12-26 DIAGNOSIS — I509 Heart failure, unspecified: Secondary | ICD-10-CM

## 2010-12-26 DIAGNOSIS — R600 Localized edema: Secondary | ICD-10-CM

## 2010-12-26 DIAGNOSIS — I5032 Chronic diastolic (congestive) heart failure: Secondary | ICD-10-CM

## 2010-12-26 MED ORDER — VALACYCLOVIR HCL 1 G PO TABS: 1000.0000 mg | ORAL_TABLET | Freq: Three times a day (TID) | ORAL | Status: DC

## 2010-12-26 NOTE — Progress Notes (Signed)
Subjective:    Patient ID: Donna Reese, female    DOB: 1932/04/01, 77 y.o.   MRN: [id number]  HPI  1. Shingles  valACYclovir (VALTREX) 1000 MG tablet  2. CKD (chronic kidney disease) stage 4, GFR 15-29 ml/min    3. SOB (shortness of breath)    4. Peripheral edema    5. Chronic diastolic heart failure    6. Anemia of chronic disease    7. Neck pain      Followup on multiple issues. The patient recently went to the emergency room, when she had some neck pain, and was able to drive, so was transported by ambulance. She was seen and evaluated. Actually gave her some ibuprofen and some Vicodin. Her neck is improved since that time point and is mildly stiff posteriorly.  She also has some new pain on her left lower posterior thorax with some mild degree of skin changes there is early and purplish but without vesicular appearance.  She short of breath at baseline without any activity. She also has some pitting edema.  Renal function has radically improved on the less diuretics. Basic Metabolic Panel:    Component Value Date/Time   NA 142 12/19/2010 1121   K 4.1 12/19/2010 1121   CL 106 12/19/2010 1121   CO2 24 12/19/2010 1121   BUN 11 12/19/2010 1121   CREATININE 1.09 12/19/2010 1121   GLUCOSE 104* 12/19/2010 1121   CALCIUM 8.3* 12/19/2010 1121   Anemia of chronic disease. Hematology does have been reviewed. To make the suggestion to discontinue hydralazine, which they felt was likely having an effect of suppressing her bone marrow somewhat in leading to decreased hemoglobin.  The PMH, PSH, Social History, Family History, Medications, and allergies have been reviewed in [facility], and have been updated if relevant.  Review of Systems No chest pain. Short of breath at baseline. Wearing her oxygen. No fever or chills. She is eating better. Weight is up 2 pounds from her last office visit to cardiology. Yesterday her weight was 165 pounds. She does have a scale.    Objective:   Physical  Exam   Physical Exam  Blood pressure 160/60, pulse 80, temperature 98.4 F (36.9 C), temperature source Oral, height 5\' 5"  (1.651 m), weight 170 lb 6.4 oz (77.293 kg), SpO2 96.00%.  GEN: WDWN, NAD, Non-toxic, A & O x 3 HEENT: Atraumatic, Normocephalic. Neck supple. No masses, No LAD. Ears and Nose: No external deformity. CV: irreg, irreg PULM: Partial crackles at the bases bilaterally mildly. No rhonchi no wheezes.  ABD: S, NT, ND, +BS. No rebound tenderness. No HSM.  Skin: mild purplish color without vesicles, small area R thorax EXTR: 1+ LE edema LE throughout shin NEURO Normal gait.  PSYCH: Normally interactive. Conversant. Not depressed or anxious appearing.  Calm demeanor.       Assessment & Plan:   1. Shingles - treat for presumptive shingles. Rash not clearly shingles, but pain at that site valACYclovir (VALTREX) 1000 MG tablet  2. CKD (chronic kidney disease) stage 4, GFR 15-29 ml/min - kidney function continues to improve   3. SOB (shortness of breath) - 1 extra dose of torsemide today. Refer to the patient instructions sections for details of plan shared with patient.    4. Peripheral edema    5. Chronic diastolic heart failure - battle between fluid and kidney injury. 2 recent hosp with ARF   6. Anemia of chronic disease - "the injections" she was told would cost her 6000  dollars out of pocket. I assume Procrit or Epo. Not an option right now   7. Neck pain - d/c all NSAIDS. Ok to use heat. Pain meds ok prn

## 2010-12-26 NOTE — Patient Instructions (Addendum)
STOP IBUPROFEN OR ANY KIND LIKE THAT (ADVIL, MOTRIN, ALLEVE) STOP HYDRALAZINE (YOU SHOULD ALREADY BE OFF)  For neck, moist heat or massage. OK to take pain medication if you need it.  (Hydrocodone/Tylenol)  Your labs recently have looked better.    Goal weight 165 pounds. If you weight more than 168 pounds, take 1 extra tablet of your diuretic (Demadex/torsemide)  Recheck in 1 month

## 2010-12-31 ENCOUNTER — Other Ambulatory Visit: Payer: Self-pay | Admitting: Family Medicine

## 2010-12-31 NOTE — H&P (Signed)
Donna Reese, VELIE NO.:  [id number]  MEDICAL RECORD NO.:  [id number]  LOCATION:  4735                         FACILITY:  [facility]  PHYSICIAN:  Richarda Overlie, MD       DATE OF BIRTH:  July 25, 1931  DATE OF ADMISSION:  11/28/2010 DATE OF DISCHARGE:                             HISTORY & PHYSICAL   PRIMARY CARE PHYSICIAN:  Spencer T. Copland, MD  PRIMARY CARDIOLOGIST:  Veverly Fells. Excell Seltzer, MD  CHIEF COMPLAINT:  Acute renal failure.  SUBJECTIVE:  This is a 77 year old female who presents to Dr. Cyndie Chime office today where she had routine blood work and was found to have a creatinine of 4.5 and was therefore referred to them for admission to [facility] today for her acute renal failure.  The patient has noticed decreased urine output, at least for the last couple of days. She states that she has had intractable nausea, vomiting, and diarrhea for the last 2 weeks.  Earlier the diarrhea was 5 times a day and currently the diarrhea has slowed down to up to 2 times a day.  The stool is semi-formed, not particularly runny but is dark in color without any evidence of melena, hematochezia.  She states that she has hemorrhoids and occasionally she sees blood on her toilet paper.  She has chronic shortness of breath and is oxygen dependent at home but her shortness of breath is unchanged from her baseline.  She has not had any chest pain per se.  The patient has also experienced some dizziness and lightheadedness over the course of the last 2 weeks.  She has had a couple of episodes of nausea and vomiting on a daily basis for at least the last 2 weeks.  She denies any fever, chills, rigors, or cough.  PAST MEDICAL HISTORY: 1. Recent admission for acute on chronic renal failure secondary to     over diuresis. 2. History of chronic congestive diastolic heart failure with the last     known EF of 65%. 3. Chronic atrial fibrillation, not considered to be a candidate  for     Coumadin. 4. Coronary artery disease status post CABG in 2009. 5. History of peripheral arterial disease. 6. Hypertension. 7. Type 2 diabetes. 8. Dyslipidemia. 9. COPD, on home oxygen 2 L at home. 10.History of gastrointestinal bleeding secondary to AV malformations. 11.History of peptic ulcer disease. 12.Diverticulosis.  PAST SURGICAL HISTORY: 1. CABG in 2009. 2. History of left renal artery stent in August 2009. 3. AAA repair in 1997. 4. IVC filter placement in 2009.  ALLERGIES: 1. IV CONTRAST causes renal failure. 2. PENICILLIN. 3. SULFA. 4. NIASPAN. 5. CODEINE. 6. MORPHINE. 7. DEMEROL. 8. DARVON.  SOCIAL HISTORY:  She is an ex-smoker.  Denies use of alcohol.  No IV drug use.  She lives with her husband.  HOME MEDICATIONS:  Torsemide, Cozaar, ferrous sulfate, hydralazine, Imdur, Klor-Con, Dulera, nitroglycerin, Prilosec, Remeron, Spiriva, Vytorin, Xalatan.  FAMILY HISTORY:  Mother died of cancer.  Father died of CVA.  REVIEW OF SYSTEMS:  Fourteen point review of systems was done as documented in the HPI.  PHYSICAL EXAMINATION:  VITAL SIGNS:  Blood pressure 156/58, respirations 20,  pulse 66, temperature 97.7, and 98% on room air. GENERAL:  Currently comfortable in no acute cardiopulmonary distress. HEENT:  Pupils equal and reactive.  Extraocular movements are intact. NECK:  Supple.  No JVD. LUNGS:  Bibasilar crackles at the bases. CARDIOVASCULAR:  Irregularly irregular. ABDOMEN:  Soft, nontender, and nondistended. EXTREMITIES:  Without cyanosis, clubbing, or edema. NEUROLOGIC:  Cranial nerves II-XII grossly intact. PSYCHIATRIC:  Appropriate mood and affect. LYMPHATIC:  No axillary, inguinal, or cervical lymphadenopathy.  LABORATORY DATA:  WBC 5.1, hemoglobin 11.5, hematocrit 34.2, and a platelet count of 151. Sodium 142, potassium 3.6, chloride 101, bicarb 27, glucose 108, BUN 67, and creatinine 4.4. EKG shows atrial fibrillation with a slow  ventricular response and 54 beats per minute, QTc is mildly prolonged at 474.  ASSESSMENT AND PLAN: 1. Acute on chronic renal failure with a baseline creatinine of around     1.4 in the setting of nausea, vomiting, diarrhea, and dehydration,     likely prerenal in etiology. 2. History of chronic atrial fibrillation, not on anticoagulation due     to prior history of gastrointestinal bleeding, currently with a     slow ventricular response without any rate controlling drugs. 3. History of coronary artery disease status post coronary artery     bypass graft in 2009. 4. Hypertension. 5. Type 2 diabetes, diet controlled. 6. Dyslipidemia. 7. Chronic obstructive pulmonary disease, oxygen-dependent. 8. Diarrhea, rule out infectious process.  PLAN: 1. The patient will be admitted to telemetry because of her atrial     fibrillation with a slow ventricular response.  We will rule out an     acute coronary syndrome and cycle cardiac enzymes, check a thyroid     function.  The patient is holding her blood pressure but she could     have sick sinus syndrome and therefore we could consider consulting     Cardiology in the morning.  However, the patient is currently     hemodynamically stable. 2. The patient's acute renal failure is likely secondary to prerenal     in etiology in the setting of her nausea, vomiting, diarrhea for     the last 2 weeks and use of ARB and her torsemide.  We will hold     both her ARB and her loop diuretic and hydrate her gently with IV     fluids to see if her kidney function improves.  We will also obtain     a urine sodium, urine creatinine with CK and a uric acid.  We will     obtain a CT urogram to rule out obstruction. 3. Atrial fibrillation, currently rate controlled, not a candidate for     anticoagulation given the history of GI bleeding. 4. Hypertension.  We will continue her on hydralazine and Imdur.  She     is a full code.     Richarda Overlie,  MD     NA/MEDQ  D:  11/28/2010  T:  11/28/2010  Job:  [id number]  cc:   Juleen China, MD  Electronically Signed by Richarda Overlie MD on 12/31/2010 02:44:35 PM

## 2011-01-03 ENCOUNTER — Ambulatory Visit (INDEPENDENT_AMBULATORY_CARE_PROVIDER_SITE_OTHER): Payer: Medicare Other | Admitting: Family Medicine

## 2011-01-03 ENCOUNTER — Encounter: Payer: Self-pay | Admitting: Oncology

## 2011-01-03 ENCOUNTER — Ambulatory Visit: Payer: Medicare Other | Admitting: Family Medicine

## 2011-01-03 ENCOUNTER — Encounter: Payer: Self-pay | Admitting: Family Medicine

## 2011-01-03 DIAGNOSIS — R600 Localized edema: Secondary | ICD-10-CM

## 2011-01-03 DIAGNOSIS — R5381 Other malaise: Secondary | ICD-10-CM

## 2011-01-03 DIAGNOSIS — R0602 Shortness of breath: Secondary | ICD-10-CM

## 2011-01-03 DIAGNOSIS — G629 Polyneuropathy, unspecified: Secondary | ICD-10-CM

## 2011-01-03 DIAGNOSIS — R5383 Other fatigue: Secondary | ICD-10-CM

## 2011-01-03 DIAGNOSIS — G609 Hereditary and idiopathic neuropathy, unspecified: Secondary | ICD-10-CM

## 2011-01-03 DIAGNOSIS — R609 Edema, unspecified: Secondary | ICD-10-CM

## 2011-01-03 DIAGNOSIS — D638 Anemia in other chronic diseases classified elsewhere: Secondary | ICD-10-CM

## 2011-01-03 LAB — CBC
HCT: 34.4 % — ABNORMAL LOW (ref 36.0–46.0)
Platelets: 140 10*3/uL — ABNORMAL LOW (ref 150–400)
RDW: 16.7 % — ABNORMAL HIGH (ref 11.5–15.5)

## 2011-01-03 LAB — CBC WITH DIFFERENTIAL/PLATELET
Basophils Absolute: 0 10*3/uL (ref 0.0–0.1)
Eosinophils Absolute: 0 10*3/uL (ref 0.0–0.7)
HCT: 30.4 % — ABNORMAL LOW (ref 36.0–46.0)
Hemoglobin: 9.9 g/dL — ABNORMAL LOW (ref 12.0–15.0)
Lymphs Abs: 0.7 10*3/uL (ref 0.7–4.0)
MCHC: 32.7 g/dL (ref 30.0–36.0)
MCV: 88.1 fl (ref 78.0–100.0)
Monocytes Absolute: 0.4 10*3/uL (ref 0.1–1.0)
Monocytes Relative: 5.7 % (ref 3.0–12.0)
Neutro Abs: 6.3 10*3/uL (ref 1.4–7.7)
RDW: 16.8 % — ABNORMAL HIGH (ref 11.5–14.6)

## 2011-01-03 LAB — VITAMIN B12: Vitamin B-12: 223 pg/mL (ref 211–911)

## 2011-01-03 LAB — TROPONIN I: Troponin I: 0.02 ng/mL (ref 0.00–0.06)

## 2011-01-03 LAB — GLUCOSE, CAPILLARY
Glucose-Capillary: 106 mg/dL — ABNORMAL HIGH (ref 70–99)
Glucose-Capillary: 108 mg/dL — ABNORMAL HIGH (ref 70–99)
Glucose-Capillary: 112 mg/dL — ABNORMAL HIGH (ref 70–99)
Glucose-Capillary: 113 mg/dL — ABNORMAL HIGH (ref 70–99)
Glucose-Capillary: 113 mg/dL — ABNORMAL HIGH (ref 70–99)
Glucose-Capillary: 89 mg/dL (ref 70–99)
Glucose-Capillary: 98 mg/dL (ref 70–99)

## 2011-01-03 LAB — COMPREHENSIVE METABOLIC PANEL
ALT: 23 U/L (ref 0–35)
AST: 36 U/L (ref 0–37)
Albumin: 3.6 g/dL (ref 3.5–5.2)
BUN: 13 mg/dL (ref 6–23)
Chloride: 100 mEq/L (ref 96–112)
Creatinine, Ser: 1 mg/dL (ref 0.4–1.2)
Creatinine, Ser: 1.02 mg/dL (ref 0.4–1.2)
GFR calc non Af Amer: 53 mL/min — ABNORMAL LOW (ref >60)
Sodium: 140 mEq/L (ref 135–145)
Total Bilirubin: 0.6 mg/dL (ref 0.3–1.2)
Total Bilirubin: 1 mg/dL (ref 0.3–1.2)

## 2011-01-03 LAB — BASIC METABOLIC PANEL
BUN: 14 mg/dL (ref 6–23)
BUN: 22 mg/dL (ref 6–23)
BUN: 25 mg/dL — ABNORMAL HIGH (ref 6–23)
CO2: 31 mEq/L (ref 19–32)
CO2: 33 mEq/L — ABNORMAL HIGH (ref 19–32)
Calcium: 8.4 mg/dL (ref 8.4–10.5)
Calcium: 8.4 mg/dL (ref 8.4–10.5)
Chloride: 100 mEq/L (ref 96–112)
Chloride: 96 mEq/L (ref 96–112)
Creatinine, Ser: 1.5 mg/dL — ABNORMAL HIGH (ref 0.4–1.2)
GFR calc non Af Amer: 34 mL/min — ABNORMAL LOW (ref >60)
GFR calc non Af Amer: 45 mL/min — ABNORMAL LOW (ref >60)
GFR calc non Af Amer: 52 mL/min — ABNORMAL LOW (ref >60)
Glucose, Bld: 93 mg/dL (ref 70–99)
Glucose, Bld: 94 mg/dL (ref 70–99)
Potassium: 3.5 mEq/L (ref 3.5–5.1)
Sodium: 138 mEq/L (ref 135–145)
Sodium: 140 mEq/L (ref 135–145)

## 2011-01-03 LAB — CK TOTAL AND CKMB (NOT AT ARMC): CK, MB: 2.4 ng/mL (ref 0.3–4.0)

## 2011-01-03 LAB — B-NATRIURETIC PEPTIDE (CONVERTED LAB): Pro B Natriuretic peptide (BNP): 446 pg/mL — ABNORMAL HIGH (ref 0.0–100.0)

## 2011-01-03 LAB — DIFFERENTIAL
Basophils Absolute: 0 10*3/uL (ref 0.0–0.1)
Lymphocytes Relative: 14 % (ref 12–46)
Monocytes Absolute: 0.3 10*3/uL (ref 0.1–1.0)
Neutro Abs: 4.6 10*3/uL (ref 1.7–7.7)

## 2011-01-03 NOTE — Progress Notes (Signed)
Subjective:    Patient ID: Donna Reese, female    DOB: 11/22/1931, 77 y.o.   MRN: [id number]  HPI 77 year old female patient of Dr. Cyndie Chime with complicated medical history present with the complaint of not feeling well. On continuous oxygen.   She was seen by Dr. C 8 days ago for the following issues 1. Shingles - treat for presumptive shingles. Rash not clearly shingles, but pain at that site  valACYclovir (VALTREX) 1000 MG tablet  2. CKD (chronic kidney disease) stage 4, GFR 15-29 ml/min - kidney function continues to improve  3. SOB (shortness of breath) - 1 extra dose of torsemide today. Refer to the patient instructions sections for details of plan shared with patient.  4. Peripheral edema  5. Chronic diastolic heart failure - battle between fluid and kidney injury. 2 recent hosp with ARF  6. Anemia of chronic disease - "the injections" she was told would cost her 6000 dollars out of pocket. I assume Procrit or Epo. Not an option right now  7. Neck pain - d/c all NSAIDS. Ok to use heat. Pain meds ok prn   Today she reports increasing weakness. Feels very fatigued. Gradually worseing Her BP is elevated today as well.. Not checking at home. No new chest pain.  Did have some poor breathing yesterday, she does have fluid in ankles, stable per pt.  Weight trending up slightly.. Up 1.8 lbs from last OV.  No cough, no fever. Took extra dose of torsemide day before yesterday.   No blood loss seen.  She feels this is what she feels like when blood count has been severely low in past... Last Hg early September was 9.0, down from 9.7 prior. Last TSH nml 8/30.  ? Last B12  Review of Systems  Constitutional: Positive for fatigue. Negative for fever.  HENT: Negative for ear pain.   Eyes: Negative for pain.  Respiratory: Positive for shortness of breath. Negative for cough, choking, chest tightness and wheezing.   Cardiovascular: Negative for chest pain, palpitations and leg  swelling.  Gastrointestinal: Negative for abdominal pain and blood in stool.  Genitourinary: Negative for dysuria and hematuria.  Skin: Negative for rash.       Objective:   Physical Exam  Constitutional: Vital signs are normal. She appears well-developed and well-nourished. She is cooperative.  Non-toxic appearance. She does not appear ill. No distress.       Conjunctiva, nail beds moderately pale  HENT:  Head: Normocephalic.  Right Ear: Hearing, tympanic membrane, external ear and ear canal normal. Tympanic membrane is not erythematous, not retracted and not bulging.  Left Ear: Hearing, tympanic membrane, external ear and ear canal normal. Tympanic membrane is not erythematous, not retracted and not bulging.  Nose: No mucosal edema or rhinorrhea. Right sinus exhibits no maxillary sinus tenderness and no frontal sinus tenderness. Left sinus exhibits no maxillary sinus tenderness and no frontal sinus tenderness.  Mouth/Throat: Uvula is midline, oropharynx is clear and moist and mucous membranes are normal.  Eyes: Conjunctivae, EOM and lids are normal. Pupils are equal, round, and reactive to light. No foreign bodies found.  Neck: Trachea normal and normal range of motion. Neck supple. Carotid bruit is not present. No mass and no thyromegaly present.  Cardiovascular: Normal rate, S1 normal, S2 normal, intact distal pulses and normal pulses.  An irregularly irregular rhythm present. Exam reveals no gallop, no distant heart sounds and no friction rub.   Murmur heard.  Systolic murmur is present  B 2 plus non pitting edema  Pulmonary/Chest: Effort normal and breath sounds normal. Not tachypneic. No respiratory distress. She has no decreased breath sounds. She has no wheezes. She has no rhonchi. She has no rales.  Abdominal: Soft. Normal appearance and bowel sounds are normal. There is no tenderness.  Neurological: She is alert.  Skin: Skin is warm, dry and intact. No rash noted.    Psychiatric: Her speech is normal and behavior is normal. Judgment and thought content normal. Her mood appears not anxious. Cognition and memory are normal. She does not exhibit a depressed mood.          Assessment & Plan:

## 2011-01-03 NOTE — Assessment & Plan Note (Signed)
See section on SOB.

## 2011-01-03 NOTE — Assessment & Plan Note (Signed)
Has seen hemetology... Does not seem that there are any current cost effective options to treat.

## 2011-01-03 NOTE — Assessment & Plan Note (Signed)
?   Due to worsening hemoglobin. Will alos eval with other routine labs.  Recent TSH nml.

## 2011-01-03 NOTE — Progress Notes (Signed)
Addended by: Baldomero Lamy on: 01/03/2011 09:58 AM   Modules accepted: Orders

## 2011-01-03 NOTE — Patient Instructions (Signed)
Call with lab report later today. Take additional dose of torsemide today for fluid overload.  If feeling much worse.. Call. If severe Shortness of breath go to ER.

## 2011-01-03 NOTE — Assessment & Plan Note (Signed)
Fairly stable... No clear sign of infection. Trending towards fluid overload.. So she will take an extra torsemide today.

## 2011-01-04 NOTE — Consult Note (Signed)
NAMEAINARA, ELDRIDGE NO.:  [id number]  MEDICAL RECORD NO.:  [id number]  LOCATION:  4735                         FACILITY:  [facility]  PHYSICIAN:  Wilber Bihari. Caryn Section, M.D.   DATE OF BIRTH:  1931/06/29  DATE OF CONSULTATION:  11/29/2010 DATE OF DISCHARGE:                                CONSULTATION   REASON FOR CONSULTATION:  Acute-on-chronic disease.  HISTORY OF PRESENT ILLNESS:  Donna Reese is a pleasant 77 year old white woman with significant past medical history of vascular disorders, AFib, COPD who comes to the hospital due to acute kidney injury with creatinine 4.4.  She had profuse watery diarrhea for about 2 weeks now from 2-5 bowel movements daily.  She says she was not able to keep up with the fluid loss.  She has also haddecreased appetite and weight loss for about last 6 months.  She denies any abdominal pain or fever.  She has had nausea and vomiting lately. She denies any chest pain, shortness of breath, headache.  She does have a history of contrast induced encephalopathy in September 2011 and recent admission with acute kidney injury with creatinine of 3.42 on November 02, 2010, due to over diuresis - she is on diuretic for diastolic CHF.  ALLERGIES:  CODEINE, PENICILLIN, SULFA, MORPHINE, DEMEROL, NIACIN, CONTRAST MEDIA.  REVIEW OF SYSTEMS:  As per HPI.  All other systems reviewed and negative.  HOME MEDICATIONS:  Torsemide, Cozaar, ferrous sulfate, hydralazine, Imdur, Klor-Con, Dulera, nitroglycerin, Prilosec, Remeron, Spiriva, Vytorin, and Xalatan.  PAST MEDICAL HISTORY: 1. CHF, diastolic dysfunction grade II with EF of 60% to 65% in     September 2011. 2. Chronic atrial fibrillation, not considered to be a candidate on     Coumadin due to history of GI bleeds. 3. CAD, status post CABG in 2009. 4. AAA repair + peripheral vascular disease. 5. Renal artery stenosis bilateral with stent on left side placed in     2009.  Mild right renal artery  stenosis (Dr. Tonny Bollman). 6. Hypertension. 7. Diabetes mellitus type II. 8. Dyslipidemia. 9. COPD on home oxygen 2 L. 10.History of GI bleed secondary to AV malformations. 11.History of peptic ulcer disease. 12.Diverticulosis.  PAST SURGICAL HISTORY: 1. CABG in 2009. 2. Left renal artery stent placed in August 2009. 3. AAA repair in 1997.  SOCIAL HISTORY:  The patient grew up in [place], [place]. [place] in [place]  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  She smoked cigarettes for about 30 years, then quit in 1990.  No alcohol or drug use.  FAMILY HISTORY:  Has one son who is healthy (married to Mirian Mo-- [facility] [employer]).  Daughter died at age of 75 due to bowel obstruction.  PHYSICAL EXAMINATION:  VITAL SIGNS:  Blood pressure 155/55, temperature 97.7, heart rate 77, respirations 20, and O2 sats 99% on room air. GENERAL:  The patient is in no acute distress, sitting in chair, comfortable. CARDIOVASCULAR:  Irregular heart rate.  S1, S2.  No murmurs, rubs, or gallops. RESPIRATIONS:  Clear to auscultation bilaterally. ABDOMEN:  Soft, nontender, nondistended.  Scar from CABG and AAA repair. EXTREMITIES:  1+ edema on the left leg and no edema on the right lower extremity. NEUROLOGIC:  Alert and oriented x3.  ASSESSMENT AND PLAN:  Donna Reese is a 77 year old woman with acute-on- chronic kidney disease due to volume depletion due to diarrhea while being on ARB and torsemide. 1. Renal.  She had creatinine of 1.35 on November 04, 2010, following     hospitalization for overdiuresis with weight 75.5     kg.  Her weight today is 70 kg and the creatinine is 4.4.  This     is most likely from volume depletion along with being on     ARB and torsemide.  She also had a history of left renal artery     stent placed in August 2009 by Dr. Excell Seltzer and mild right renal     artery stenosis and also had  admission to CHF this year.     Recommendations, Duplex scan of renal arteries in vascular lab in     to check for progression of stenosis in the stented L side or     on the right side.  Continue IV fluid with normal saline at 200 mL     per hour, iron studies and we will add SPEP and UPEP studies.  Uric acid     is likely elevated from volume depletion we'd expect it to      improve with volume repletion.     Ultrasoundbwas done in November 02, 2010, which showed right     kidney 9 cm and left kidney 10.8 cm with no hydronephrosis. 2. History of AFib.  The patient is not on Coumadin--no     anticoagulation due to history of GI bleeding secondary to AV     malformation--management per primary team. 3. History of CAD, status post CABG and diastolic CHF.  Hold diuretic     for now and after that management per primary team. 4. Hypertension.  Blood pressure mildly elevated at this point of     time.  Management per primary team. 5. Diabetes mellitus type 2, diet controlled, not on any medications. 6. COPD with home O2 - continue O2 as required with nasal cannula. 7. Diarrhea.  Stool studies sent to check for an infectious process     along with Clostridium difficile.    ______________________________ Lyn Hollingshead, MD   ______________________________ Wilber Bihari. Caryn Section, M.D.    RP/MEDQ  D:  11/29/2010  T:  11/29/2010  Job:  [id number]  Electronically Signed by Lyn Hollingshead MD on 01/03/2011 11:46:26 AM Electronically Signed by Marina Gravel M.D. on 01/04/2011 08:45:40 AM

## 2011-01-07 ENCOUNTER — Encounter: Payer: Self-pay | Admitting: Family Medicine

## 2011-01-07 ENCOUNTER — Ambulatory Visit (INDEPENDENT_AMBULATORY_CARE_PROVIDER_SITE_OTHER): Payer: Medicare Other | Admitting: Family Medicine

## 2011-01-07 ENCOUNTER — Ambulatory Visit: Payer: Medicare Other | Admitting: Family Medicine

## 2011-01-07 DIAGNOSIS — D638 Anemia in other chronic diseases classified elsewhere: Secondary | ICD-10-CM

## 2011-01-07 DIAGNOSIS — R0602 Shortness of breath: Secondary | ICD-10-CM

## 2011-01-07 DIAGNOSIS — R5383 Other fatigue: Secondary | ICD-10-CM

## 2011-01-07 DIAGNOSIS — E538 Deficiency of other specified B group vitamins: Secondary | ICD-10-CM

## 2011-01-07 DIAGNOSIS — J449 Chronic obstructive pulmonary disease, unspecified: Secondary | ICD-10-CM

## 2011-01-07 NOTE — Progress Notes (Signed)
Subjective:    Patient ID: Donna Reese, female    DOB: [birth year], 77 y.o.   MRN: [id number]  HPI  Donna Reese, a 77 y.o. female presents today in the office for the following:    Followup in a patient with severe diastolic heart failure, COPD, oxygen dependence, persistent edema, shortness of breath, history of multiple thromboses, atrial fibrillation, and other medical problems. She is here in followup, having not felt very well. She does have anemia of chronic disease with her last hemoglobin less than 10. Her hydralazine has been stopped. She has seen to have a hematologist as well. Additionally, her B12 is been found to be borderline low, but she did not understand that she was supposed to take additional B12.  Overall, she does not feel that great, has baseline SOB.   Prior OV, Dr. Ermalene Searing last week 77 year old female patient of Dr. Cyndie Chime with complicated medical history present with the complaint of not feeling well. On continuous oxygen.   She was seen by Dr. C 8 days ago for the following issues 1. Shingles - treat for presumptive shingles. Rash not clearly shingles, but pain at that site   valACYclovir (VALTREX) 1000 MG tablet   2. CKD (chronic kidney disease) stage 4, GFR 15-29 ml/min - kidney function continues to improve   3. SOB (shortness of breath) - 1 extra dose of torsemide today. Refer to the patient instructions sections for details of plan shared with patient.   4. Peripheral edema   5. Chronic diastolic heart failure - battle between fluid and kidney injury. 2 recent hosp with ARF   6. Anemia of chronic disease - "the injections" she was told would cost her 6000 dollars out of pocket. I assume Procrit or Epo. Not an option right now   7. Neck pain - d/c all NSAIDS. Ok to use heat. Pain meds ok prn   Today she reports increasing weakness. Feels very fatigued. Gradually worseing Her BP is elevated today as well.. Not checking at home. No new chest pain.     Did have some poor breathing yesterday, she does have fluid in ankles, stable per pt.  Weight trending up slightly.. Up 1.8 lbs from last OV.  No cough, no fever. Took extra dose of torsemide day before yesterday.   No blood loss seen.  She feels this is what she feels like when blood count has been severely low in past... Last Hg early September was 9.0, down from 9.7 prior. Last TSH nml 8/30.  ? Last B12  Patient Active Problem List  Diagnoses  . HYPERLIPIDEMIA  . GOUT, ACUTE  . DEPRESSION  . MIGRAINE WITHOUT AURA  . POLYNEUROPATHY IN DIABETES  . HYPERTENSION  . Coronary atherosclerosis of native coronary artery  . Atrial fibrillation  . CHRONIC SYSTOLIC HEART FAILURE  . RENAL ARTERY STENOSIS  . PERIPHERAL VASCULAR DISEASE  . COPD  . PEPTIC ULCER DISEASE  . CKD (chronic kidney disease) stage 4, GFR 15-29 ml/min  . DEGENERATIVE DISC DISEASE  . OSTEOPENIA  . Personal History of Venous Thrombosis and Embolism  . SOB (shortness of breath)  . Peripheral edema  . Chronic diastolic heart failure  . Other malaise and fatigue  . Anemia of chronic disease   Past Medical History  Diagnosis Date  . Diabetes mellitus   . COPD (chronic obstructive pulmonary disease)   . Diverticulosis   . Osteopenia   . PUD (peptic ulcer disease)   . Peripheral  arterial disease   . Hypertension   . Hyperlipidemia   . Diastolic heart failure     a. echo 9/11: EF 60-65%, mild LVH, grade 2 diast dysfxn, AV mean gradient 9 mmHg, mild MR, RVE, RVSP 59  . Coronary artery disease     a. s/p CABG;  b. cath 8/09: LM occluded; RCA 50%; L-LAD ok, Radial-OM ok; bilat renal art stenosis  . Renovascular hypertension   . Renal artery stenosis     a. s/p L RA stent 8/09  . Paroxysmal a-fib     no coumadin 2/2 GI Blee  . DVT (deep venous thrombosis)   . Pulmonary embolus     s/p IVC filter  . History of gastrointestinal bleeding     no ASA or coumadin  . Contrast dye induced nephropathy   .  Orthostatic hypotension    Past Surgical History  Procedure Date  . Appendectomy   . Cholecystectomy   . Abdominal hysterectomy   . Coronary artery bypass graft 09-04-2001    carotid u/s unchnaged 40-50% ICA stenosis 12/1998  . Cataract extraction, bilateral 2001  . Abdominal aortic aneurysm repair 1997  . Nm myoview ltd     neg EF 76%--06/26/2004  . Carotid doppler     12/05 unchanged 9-06  . Cardiac catheterization 09-03-01  . Esophagogastroduodenoscopy     abnormal 2007, esophageal stricture 10-12-03-07 gastritis and major bleef  . Colonoscopy     abnormal 03-13-06  . Vena cava filter placement 07-2007    Dr. Excell Seltzer  . Renal artery doppler 11-17-2008    severe iliac stenosis   History  Substance Use Topics  . Smoking status: Former Smoker -- 1.0 packs/day for 40 years    Types: Cigarettes    Quit date: 04/01/1990  . Smokeless tobacco: Not on file  . Alcohol Use: No   Family History  Problem Relation Age of Onset  . Cancer Mother   . Stroke Father   . Stroke Brother   . Heart attack Brother   . Heart attack Brother   . Heart attack Brother    Allergies  Allergen Reactions  . Codeine   . Meperidine Hcl   . Morphine   . Niacin     REACTION: rash  . Penicillins   . Propoxyphene Hcl   . Sulfonamide Derivatives    Current Outpatient Prescriptions on File Prior to Visit  Medication Sig Dispense Refill  . albuterol (VENTOLIN HFA) 108 (90 BASE) MCG/ACT inhaler Inhale 2 puffs into the lungs every 4 (four) hours as needed.        Marland Kitchen amLODipine (NORVASC) 10 MG tablet Take 1 tablet (10 mg total) by mouth daily.  30 tablet  6  . COLCRYS 0.6 MG tablet TAKE 1 TABLET (0.6 MG TOTAL) BY MOUTH 2 (TWO) TIMES DAILY.  60 tablet  1  . ezetimibe-simvastatin (VYTORIN) 10-20 MG per tablet Take 1 tablet by mouth at bedtime.        . ferrous sulfate 325 (65 FE) MG tablet Take 325 mg by mouth daily with breakfast.        . FLUoxetine (PROZAC) 10 MG tablet Take 1 tablet (10 mg total) by mouth  daily.  30 tablet  5  . HYDROcodone-acetaminophen (NORCO) 5-325 MG per tablet Take 1 tablet by mouth every 4 (four) hours.       . isosorbide mononitrate (IMDUR) 30 MG 24 hr tablet Take 1 tablet (30 mg total) by mouth daily.  30 tablet  11  . ketorolac (ACULAR) 0.4 % SOLN Place 1 drop into the right eye 4 (four) times daily.        Marland Kitchen latanoprost (XALATAN) 0.005 % ophthalmic solution Place 1 drop into both eyes daily.        . megestrol (MEGACE) 400 MG/10ML suspension Take 10 mLs (400 mg total) by mouth daily.  480 mL  5  . metoprolol (TOPROL-XL) 50 MG 24 hr tablet Take 25 mg by mouth daily.       . mirtazapine (REMERON) 15 MG tablet TAKE 1 TABLET BY MOUTH AT BEDTIME  30 tablet  5  . Mometasone Furo-Formoterol Fum (DULERA) 200-5 MCG/ACT AERO Inhale 2 puffs into the lungs 2 (two) times daily.  1 Inhaler  11  . nitroGLYCERIN (NITROSTAT) 0.4 MG SL tablet Place 1 tablet (0.4 mg total) under the tongue every 5 (five) minutes as needed for chest pain.  25 tablet  2  . NON FORMULARY 2 L/min by Nasal route continuous. oxygen       . omeprazole (PRILOSEC) 40 MG capsule Take 40 mg by mouth daily.        Marland Kitchen tiotropium (SPIRIVA) 18 MCG inhalation capsule Place 18 mcg into inhaler and inhale daily.        Marland Kitchen torsemide (DEMADEX) 20 MG tablet Take 20 mg by mouth 2 (two) times daily.        . valACYclovir (VALTREX) 1000 MG tablet Take 1 tablet (1,000 mg total) by mouth 3 (three) times daily.  21 tablet  0     Review of Systems Feels fatigued, short of breath at baseline. Has been short of breath at baseline for some time. Edema is not significant today. Weight is steady compared to prior office visits. No dep. Eating well.    Objective:   Physical Exam   Physical Exam  Blood pressure 160/60, pulse 73, temperature 97.8 F (36.6 C), temperature source Oral, height 5\' 5"  (1.651 m), weight 165 lb (74.844 kg), SpO2 94.00%.  GEN: WDWN, NAD, Non-toxic, A & O x 3 HEENT: Atraumatic, Normocephalic. Neck supple. No  masses, No LAD. Ears and Nose: No external deformity. CV: irreg, irreg PULM: faint crackles, B lungs - scarce ABD: S, NT, ND, +BS. No rebound tenderness. No HSM.  EXTR: tr LE edema NEURO Normal gait.  PSYCH: Normally interactive. Conversant. Not depressed or anxious appearing.  Calm demeanor.        Assessment & Plan:   1. SOB (shortness of breath)   2. Other malaise and fatigue   3. Anemia of chronic disease   4. COPD   5. B12 deficiency     I suspect her fatigue is multifactorial in nature. Overall, at least on my assessment, the patient appears to be feeling significantly better compared to multiple prior evaluations in the past. She does have some fatigue and some shortness of breath at baseline.  Appreciate hematology input. Hydralazine has been stopped. I suspect that it will take some time for her bone marrow to compensate in producing red blood cells. She also continues on iron and we started B12.  Reinforced the importance of using her inhalers.

## 2011-01-07 NOTE — Patient Instructions (Signed)
Restart Spiriva (the inhaler with the little capsule)  Take B12 a day  Recheck in 6 weeks

## 2011-01-08 ENCOUNTER — Ambulatory Visit: Payer: Medicare Other | Admitting: Family Medicine

## 2011-01-10 NOTE — Discharge Summary (Signed)
Donna Reese, TRULSON NO.:  [id number]  MEDICAL RECORD NO.:  [id number]  LOCATION:  4735                         FACILITY:  [facility]  PHYSICIAN:  Zannie Cove, MD     DATE OF BIRTH:  01/24/1932  DATE OF ADMISSION:  11/28/2010 DATE OF DISCHARGE:                        DISCHARGE SUMMARY - REFERRING   CARDIOLOGIST:  Dr. Tonny Bollman.  DISCHARGE DIAGNOSIS: 1. Acute renal failure secondary to prerenal azotemia/volume     depletion, improved. 2. Chronic kidney disease, stage III. 3. Mild pancytopenia/thrombocytopenia. 4. Diarrhea, resolved. 5. History of diastolic congestive heart failure. 6. History of gastrointestinal bleed. 7. History of paroxysmal atrial fibrillation, not on anticoagulation     due to gastrointestinal bleed. 8. Hypertension. 9. Type 2 diabetes, diet controlled. 10.Dyslipidemia. 11.Chronic obstructive pulmonary disease on home O2. 12.History of diverticulosis. 13.History of abdominal aortic aneurysm repair in 1997. 14.History of left renal artery stent in August 2009. 15.History of coronary artery disease, status post coronary artery     bypass graft. 16.Anemia secondary to chronic disease and iron deficiency.  DISCHARGE MEDICATIONS: 1. Albuterol inhaler 2 puffs q.4 hours p.r.n. 2. Amlodipine 10 mg daily. 3. Ferrous sulfate 325 mg daily. 4. Imdur 30 mg daily. 5. Ketorolac ophthalmic 0.4% 1 drop in right eye q.i.d. 6. Toprol-XL 50 mg half tablet daily. 7. Mometasone inhaler 2 puffs inhaled b.i.d. 8. Sublingual nitroglycerin 0.4 sublingual q. 5 minutes up to 3 doses     as needed. 9. Prilosec 40 mg daily. 10.Spiriva 18 mcg 1 capsule daily. 11.Vytorin 10/20 one tablets nightly. 12.Xalatan ophthalmic 1 drop in both eyes nightly.  CONSULTANTS: 1. Dr. Marina Gravel with [facility]. 2. Dr. Arlan Organ with Hematology.  DIAGNOSTICS AND INVESTIGATIONS:  CT of abdomen and pelvis noncontrast shows no renal calculi,  hydronephrosis, advanced atherosclerotic disease with aorta graft in place and vasculature evaluation otherwise limited without IV contrast.  Chest x-ray on August 23 showed stable findings of trace interstitial edema, no new focal abnormality.  HOSPITAL COURSE:  Donna Reese is a very pleasant 77 year old white female who presented to the hospital with renal failure. 1. Acute renal failure on chronic kidney disease stage III, on     admission, she was found to have a creatinine of 4.4 which was much     different from her baseline of 1.45.  The etiology of her renal     cell failure felt to be prerenal etiology from her volume     depletion.  She had been having ongoing diarrhea off and on for 2     weeks prior and was also on a diuretic and ARB along with this.  On     admission to the hospital, she did have a CTA of her abdomen,     noncontrast, which did not show any hydronephrosis.  Her ARB was     discontinued and diuretics were held as well, was started with     aggressive fluid resuscitation, with this her creatinine improved     to 2.8 and subsequently to 1.7 today, and will be discharged home     today off her diuretics as well as her ARB.  Her diuretics probably  be resumed upon followup. 2. Mild pancytopenia/thrombocytopenia.  This was felt to be     multifactorial in etiology.  Her hydralazine which has been causing     lupus-like features was discontinued and she did not receive any     heparin or heparin like products in the hospital, was seen by Dr.     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 Myna Hidalgo felt that she could be     followed as outpatient by one of his colleagues at the cancer     center since this would be logistically closer to her in location.     The  patient is advised to call the cancer center to set up for     hospital followup appointment with one of hematologist here,     unfortunately, I am not able to do this now because of the weekend     and hence this will need to be set up by the patient with her PCP's     assistance.  She will also need Procrit shots for her anemia and     possibly IV iron if her hemoglobin continues to remain low despite     p.o. iron replacement. 3. His diarrhea felt to be infectious.  This had resolved through her     hospital course. 4. Diastolic CHF, stable.  Her diuretic torsemide is being held for     now which will need to be resumed as outpatient upon followup.     Rest of her chronic medical problems remained stable.  DISCHARGE CONDITION:  Stable.  VITAL SIGNS:  Temperature 98.7, pulse 79, blood pressure 160/65, respirations 18, satting on 96% on 2 L.  DISCHARGE LABS:  White count of 2.8, hemoglobin 9.0, platelets are 73. Chemistry sodium 143, potassium 2.1, chloride 107, bicarb 24, BUN 50, creatinine 1.7 glucose 92.  DISCHARGE FOLLOWUP: 1. With Dr. Karleen Hampshire Copland within 1 week. 2. Hematologist at the [facility] in the next 2-3 weeks.     She will need to be set up for Procrit and/or IV iron on followup.     Zannie Cove, MD     PJ/MEDQ  D:  12/01/2010  T:  12/01/2010  Job:  [id number]  cc:   Juleen China, MD [facility]  Electronically Signed by Zannie Cove  on 01/10/2011 02:12:03 PM

## 2011-01-19 ENCOUNTER — Other Ambulatory Visit: Payer: Self-pay | Admitting: Family Medicine

## 2011-01-29 ENCOUNTER — Ambulatory Visit: Payer: Medicare Other | Admitting: Family Medicine

## 2011-01-30 ENCOUNTER — Ambulatory Visit (INDEPENDENT_AMBULATORY_CARE_PROVIDER_SITE_OTHER): Payer: Medicare Other | Admitting: Family Medicine

## 2011-01-30 ENCOUNTER — Encounter: Payer: Self-pay | Admitting: Family Medicine

## 2011-01-30 ENCOUNTER — Telehealth: Payer: Self-pay | Admitting: Radiology

## 2011-01-30 DIAGNOSIS — R112 Nausea with vomiting, unspecified: Secondary | ICD-10-CM

## 2011-01-30 DIAGNOSIS — N184 Chronic kidney disease, stage 4 (severe): Secondary | ICD-10-CM

## 2011-01-30 DIAGNOSIS — D638 Anemia in other chronic diseases classified elsewhere: Secondary | ICD-10-CM

## 2011-01-30 LAB — CBC WITH DIFFERENTIAL/PLATELET
Basophils Absolute: 0 10*3/uL (ref 0.0–0.1)
Eosinophils Absolute: 0 10*3/uL (ref 0.0–0.7)
Eosinophils Relative: 0.1 % (ref 0.0–5.0)
HCT: 36.8 % (ref 36.0–46.0)
Lymphs Abs: 1 10*3/uL (ref 0.7–4.0)
MCHC: 33.5 g/dL (ref 30.0–36.0)
MCV: 87.6 fl (ref 78.0–100.0)
Monocytes Absolute: 0.4 10*3/uL (ref 0.1–1.0)
Neutrophils Relative %: 81.4 % — ABNORMAL HIGH (ref 43.0–77.0)
Platelets: 120 10*3/uL — ABNORMAL LOW (ref 150.0–400.0)
RDW: 17.2 % — ABNORMAL HIGH (ref 11.5–14.6)

## 2011-01-30 LAB — BASIC METABOLIC PANEL
Calcium: 6.9 mg/dL — ABNORMAL LOW (ref 8.4–10.5)
GFR: 22.73 mL/min — ABNORMAL LOW (ref >60.00)
Glucose, Bld: 98 mg/dL (ref 70–99)
Potassium: 2.6 mEq/L — CL (ref 3.5–5.1)
Sodium: 138 mEq/L (ref 135–145)

## 2011-01-30 MED ORDER — ONDANSETRON HCL 4 MG PO TABS: ORAL_TABLET | ORAL | Status: DC

## 2011-01-30 NOTE — Telephone Encounter (Signed)
Called  Will take 20 meq extra KCL today 30 meq tomorrow today  Then start with 20 meQ KCL daily   Can you help schedule a potassium recheck for next Tuesday for her?

## 2011-01-30 NOTE — Telephone Encounter (Signed)
Lab appt set up for Monday not Thursday b/c dr. Patsy Lager changed when he wanted

## 2011-01-30 NOTE — Progress Notes (Signed)
Subjective:    Patient ID: Donna Reese, female    DOB: [birth year], 77 y.o.   MRN: [id number]  HPI  Donna Reese, a 77 y.o. female presents today in the office for the following:    25 pound weight loss.  F/u anemia: feeling OK. Taking B12, not iron CBC:    Component Value Date/Time   WBC 7.5 01/03/2011 0958   HGB 9.9* 01/03/2011 0958   HCT 30.4* 01/03/2011 0958   PLT 195.0 01/03/2011 0958   MCV 88.1 01/03/2011 0958   NEUTROABS 6.3 01/03/2011 0958   LYMPHSABS 0.7 01/03/2011 0958   MONOABS 0.4 01/03/2011 0958   EOSABS 0.0 01/03/2011 0958   BASOSABS 0.0 01/03/2011 0958     F/u CKD:  Basic Metabolic Panel:    Component Value Date/Time   NA 140 01/03/2011 0943   K 4.0 01/03/2011 0943   CL 103 01/03/2011 0943   CO2 32 01/03/2011 0943   BUN 7 01/03/2011 0943   CREATININE 1.0 01/03/2011 0943   GLUCOSE 88 01/03/2011 0943   CALCIUM 8.4 01/03/2011 0943   Cr has improved with decreased diuretic use.  Feeling nauseous much of the time. Trouble eating and will sometimes throw up after eating.  25 pound weight loss. Taking diuretics as prescribed.  Stopped Megace.  Right now the stomach is the biggest thing.   01/07/2011 OV Followup in a patient with severe diastolic heart failure, COPD, oxygen dependence, persistent edema, shortness of breath, history of multiple thromboses, atrial fibrillation, and other medical problems. She is here in followup, having not felt very well. She does have anemia of chronic disease with her last hemoglobin less than 10. Her hydralazine has been stopped. She has seen to have a hematologist as well. Additionally, her B12 is been found to be borderline low, but she did not understand that she was supposed to take additional B12.  Prior OV: 01/03/2011 77 year old female patient of Dr. Cyndie Chime with complicated medical history present with the complaint of not feeling well. On continuous oxygen.   She was seen by Dr. C 8 days ago for the following issues 1.  Shingles - treat for presumptive shingles. Rash not clearly shingles, but pain at that site   valACYclovir (VALTREX) 1000 MG tablet   2. CKD (chronic kidney disease) stage 4, GFR 15-29 ml/min - kidney function continues to improve   3. SOB (shortness of breath) - 1 extra dose of torsemide today. Refer to the patient instructions sections for details of plan shared with patient.   4. Peripheral edema   5. Chronic diastolic heart failure - battle between fluid and kidney injury. 2 recent hosp with ARF   6. Anemia of chronic disease - "the injections" she was told would cost her 6000 dollars out of pocket. I assume Procrit or Epo. Not an option right now   7. Neck pain - d/c all NSAIDS. Ok to use heat. Pain meds ok prn   Today she reports increasing weakness. Feels very fatigued. Gradually worseing Her BP is elevated today as well.. Not checking at home. No new chest pain.   Did have some poor breathing yesterday, she does have fluid in ankles, stable per pt.  Weight trending up slightly.. Up 1.8 lbs from last OV.  No cough, no fever. Took extra dose of torsemide day before yesterday.   No blood loss seen.  She feels this is what she feels like when blood count has been severely low in past... Last  Hg early September was 9.0, down from 9.7 prior. Last TSH nml 8/30.  ? Last B12   The PMH, PSH, Social History, Family History, Medications, and allergies have been reviewed in [facility], and have been updated if relevant.   Review of Systems ROS: GEN: No acute illnesses, no fevers, chills. GI: No n/v/d, eating normally Pulm: sob has improved Decreased edema Interactive and getting along well at home.  Otherwise, ROS is as per the HPI.     Objective:   Physical Exam   Physical Exam  Blood pressure 140/60, pulse 73, temperature 97.6 F (36.4 C), temperature source Oral, height 5\' 5"  (1.651 m), weight 140 lb (63.504 kg), SpO2 94.00%.  GEN: WDWN, NAD, Non-toxic, A & O x 3 HEENT:  Atraumatic, Normocephalic. Neck supple. No masses, No LAD. Ears and Nose: No external deformity. CV: irreg, irreg PULM: CTA B. No retractions. No resp. distress. No accessory muscle use. EXTR: No c/c/e NEURO Normal gait.  PSYCH: Normally interactive. Conversant. Not depressed or anxious appearing.  Calm demeanor.       Assessment & Plan:   1. CKD (chronic kidney disease) stage 4, GFR 15-29 ml/min  Basic metabolic panel  2. Anemia of chronic disease  CBC with Differential  3. Nausea & vomiting  ondansetron (ZOFRAN) 4 MG tablet    Recheck labs  Biggest issue at this point is the chronic nausea, decreased appetite. Multiple meds could interact with kidney, given recent history will try scheduled zofran prior to meals.

## 2011-01-30 NOTE — Patient Instructions (Signed)
Recheck in 4-6 weeks

## 2011-01-30 NOTE — Telephone Encounter (Signed)
Call  

## 2011-01-30 NOTE — Telephone Encounter (Signed)
[facility] called a critical K+ - 2.6 

## 2011-02-04 ENCOUNTER — Other Ambulatory Visit (INDEPENDENT_AMBULATORY_CARE_PROVIDER_SITE_OTHER): Payer: Medicare Other

## 2011-02-04 DIAGNOSIS — N259 Disorder resulting from impaired renal tubular function, unspecified: Secondary | ICD-10-CM

## 2011-02-04 DIAGNOSIS — I5022 Chronic systolic (congestive) heart failure: Secondary | ICD-10-CM

## 2011-02-04 LAB — BASIC METABOLIC PANEL
Calcium: 7 mg/dL — ABNORMAL LOW (ref 8.4–10.5)
Chloride: 92 mEq/L — ABNORMAL LOW (ref 96–112)
Creatinine, Ser: 2.4 mg/dL — ABNORMAL HIGH (ref 0.4–1.2)
Sodium: 140 mEq/L (ref 135–145)

## 2011-02-05 LAB — LACTATE DEHYDROGENASE: LDH: 203 U/L (ref 94–250)

## 2011-02-05 LAB — SEDIMENTATION RATE: Sed Rate: 8 mm/hr (ref 0–22)

## 2011-02-07 ENCOUNTER — Encounter: Payer: Self-pay | Admitting: Cardiovascular Disease

## 2011-02-07 ENCOUNTER — Ambulatory Visit (INDEPENDENT_AMBULATORY_CARE_PROVIDER_SITE_OTHER): Payer: Medicare Other | Admitting: Cardiovascular Disease

## 2011-02-07 DIAGNOSIS — R109 Unspecified abdominal pain: Secondary | ICD-10-CM

## 2011-02-07 NOTE — Patient Instructions (Signed)
Your physician recommends that you schedule a follow-up appointment in: 4 MONTHS  Your physician recommends that you continue on your current medications as directed. Please refer to the Current Medication list given to you today.  Your physician recommends that you have a visceral duplex to evaluate for mesenteric ischemia.

## 2011-02-07 NOTE — Assessment & Plan Note (Signed)
The patient has abdominal pain associated with weight loss. She has severe diffuse vascular disease. She is at high risk for mesenteric ischemia. I went back and reviewed her imaging studies and she did have a CT angiogram about one year ago demonstrating heavy calcification at the SMA and celiac origins. It was difficult to grade the degree of stenosis because of calcification. I have recommended an abdominal duplex ultrasound to evaluate for mesenteric ischemia. Depending on what the findings are in the index of suspicion for severe mesenteric ischemia, we would have to carefully decide about her treatment options. She has become increasingly frail and has a history of contrast nephropathy so our treatment options may be limited. However, considering the patient's progressive weight loss and overall decline, evaluation of this problem seems reasonable.

## 2011-02-07 NOTE — Progress Notes (Signed)
HPI:  This Yiu returns for followup evaluation. She is a 77 year old woman with multiple cardiac and medical comorbidities.  She has a history of remote aortobifemoral bypass, coronary bypass surgery, diastolic heart failure, chronic kidney disease, DVT/pulmonary embolus with inability to anticoagulate because of chronic GI bleeding. She has had an IVC filter placed. She also has had bilateral renal stents.  Her biggest problems recently have related to recurrent acute renal failure and difficulty balancing treatment of congestive heart failure with progressive chronic kidney disease.  She continues to have more difficulty with abdominal discomfort, nausea, and weight loss. She has lost 21 pounds in the last month and has lost over 100 pounds in the last year. She reports burning abdominal pain with eating. She has nausea both before and after eating. She states "I just don't want to eat." She complains of chronic shortness of breath which is essentially unchanged. She is on home oxygen 24 hours per day. She has occasional chest pains unrelated to physical activity. Her leg edema has completely resolved. She feels fatigued and weak. She ambulates with a walker. She is not able to do much at all around the house anymore.  Outpatient Encounter Prescriptions as of 02/07/2011  Medication Sig Dispense Refill  . albuterol (VENTOLIN HFA) 108 (90 BASE) MCG/ACT inhaler Inhale 2 puffs into the lungs every 4 (four) hours as needed.        Marland Kitchen amLODipine (NORVASC) 10 MG tablet Take 1 tablet (10 mg total) by mouth daily.  30 tablet  6  . COLCRYS 0.6 MG tablet TAKE 1 TABLET (0.6 MG TOTAL) BY MOUTH 2 (TWO) TIMES DAILY.  60 tablet  1  . ezetimibe-simvastatin (VYTORIN) 10-20 MG per tablet Take 1 tablet by mouth at bedtime.        . ferrous sulfate 325 (65 FE) MG tablet Take 325 mg by mouth daily with breakfast.        . HYDROcodone-acetaminophen (NORCO) 5-325 MG per tablet Take 1 tablet by mouth every 4 (four) hours.         . isosorbide mononitrate (IMDUR) 30 MG 24 hr tablet Take 1 tablet (30 mg total) by mouth daily.  30 tablet  11  . ketorolac (ACULAR) 0.4 % SOLN Place 1 drop into the right eye 4 (four) times daily.        Marland Kitchen KLOR-CON 10 10 MEQ CR tablet Take 1 tablet by mouth daily.       Marland Kitchen latanoprost (XALATAN) 0.005 % ophthalmic solution Place 1 drop into both eyes daily.        . metoprolol (TOPROL-XL) 50 MG 24 hr tablet Take 25 mg by mouth daily.       . mirtazapine (REMERON) 15 MG tablet TAKE 1 TABLET BY MOUTH AT BEDTIME  30 tablet  5  . Mometasone Furo-Formoterol Fum (DULERA) 200-5 MCG/ACT AERO Inhale 2 puffs into the lungs 2 (two) times daily.  1 Inhaler  11  . nitroGLYCERIN (NITROSTAT) 0.4 MG SL tablet Place 1 tablet (0.4 mg total) under the tongue every 5 (five) minutes as needed for chest pain.  25 tablet  2  . NON FORMULARY 2 L/min by Nasal route continuous. oxygen       . omeprazole (PRILOSEC) 40 MG capsule Take 40 mg by mouth daily.        . ondansetron (ZOFRAN) 4 MG tablet 1 tab 30 minutes before meals three times a day  90 tablet  3  . tiotropium (SPIRIVA) 18 MCG inhalation capsule  Place 18 mcg into inhaler and inhale daily.        Marland Kitchen torsemide (DEMADEX) 20 MG tablet Take 1 tablet (20 mg total) by mouth 2 (two) times daily.  60 tablet  4    Allergies  Allergen Reactions  . Codeine   . Meperidine Hcl   . Morphine   . Niacin     REACTION: rash  . Penicillins   . Propoxyphene Hcl   . Sulfonamide Derivatives     Past Medical History  Diagnosis Date  . Diabetes mellitus   . COPD (chronic obstructive pulmonary disease)   . Diverticulosis   . Osteopenia   . PUD (peptic ulcer disease)   . Peripheral arterial disease   . Hypertension   . Hyperlipidemia   . Diastolic heart failure     a. echo 9/11: EF 60-65%, mild LVH, grade 2 diast dysfxn, AV mean gradient 9 mmHg, mild MR, RVE, RVSP 59  . Coronary artery disease     a. s/p CABG;  b. cath 8/09: LM occluded; RCA 50%; L-LAD ok, Radial-OM  ok; bilat renal art stenosis  . Renovascular hypertension   . Renal artery stenosis     a. s/p L RA stent 8/09  . Paroxysmal a-fib     no coumadin 2/2 GI Blee  . DVT (deep venous thrombosis)   . Pulmonary embolus     s/p IVC filter  . History of gastrointestinal bleeding     no ASA or coumadin  . Contrast dye induced nephropathy   . Orthostatic hypotension     ROS: Negative except as per HPI  BP 120/52  Pulse 88  Resp 20  Ht 5\' 5"  (1.651 m)  Wt 65.681 kg (144 lb 12.8 oz)  BMI 24.10 kg/m2  SpO2 99%  PHYSICAL EXAM: Pt is alert and oriented, elderly woman on nasal cannula oxygen in NAD, she is very pleasant. She is visibly much thinner than in the past. HEENT: normal Neck: JVP - normal, carotids 2+= with bilateral bruits Lungs: CTA bilaterally CV: RRR with grade 2/6 systolic murmur at the left sternal border Abd: soft, there is mild tenderness to deep palpation at the epigastrium, Positive BS, no hepatomegaly Ext: no C/C/E Skin: warm/dry no rash  ASSESSMENT AND PLAN:

## 2011-02-07 NOTE — Assessment & Plan Note (Signed)
Heart rate is well controlled. She is not a candidate for anticoagulation because of GI bleeding.

## 2011-02-07 NOTE — Assessment & Plan Note (Signed)
The patient is stable on her current medical therapy. Her blood pressure is in a good range today. She continues on torsemide 20 mg twice daily. She is tolerating the beta blocker and isosorbide. She cannot take an ACE inhibitor because of significant chronic kidney disease.

## 2011-02-07 NOTE — Assessment & Plan Note (Signed)
Stable without change in her anginal pattern.

## 2011-02-08 ENCOUNTER — Encounter (HOSPITAL_COMMUNITY): Payer: Self-pay | Admitting: *Deleted

## 2011-02-08 ENCOUNTER — Emergency Department (HOSPITAL_COMMUNITY): Payer: Medicare Other

## 2011-02-08 ENCOUNTER — Telehealth: Payer: Self-pay | Admitting: *Deleted

## 2011-02-08 ENCOUNTER — Other Ambulatory Visit: Payer: Medicare Other

## 2011-02-08 ENCOUNTER — Inpatient Hospital Stay (HOSPITAL_COMMUNITY)
Admission: EM | Admit: 2011-02-08 | Discharge: 2011-02-13 | DRG: 683 | Disposition: A | Discharge status: Home Health | Payer: Medicare Other | Attending: Internal Medicine | Admitting: Internal Medicine

## 2011-02-08 DIAGNOSIS — R531 Weakness: Secondary | ICD-10-CM

## 2011-02-08 DIAGNOSIS — R0602 Shortness of breath: Secondary | ICD-10-CM

## 2011-02-08 DIAGNOSIS — I5042 Chronic combined systolic (congestive) and diastolic (congestive) heart failure: Secondary | ICD-10-CM | POA: Diagnosis present

## 2011-02-08 DIAGNOSIS — Z23 Encounter for immunization: Secondary | ICD-10-CM

## 2011-02-08 DIAGNOSIS — I4891 Unspecified atrial fibrillation: Secondary | ICD-10-CM

## 2011-02-08 DIAGNOSIS — D638 Anemia in other chronic diseases classified elsewhere: Secondary | ICD-10-CM | POA: Diagnosis present

## 2011-02-08 DIAGNOSIS — N179 Acute kidney failure, unspecified: Secondary | ICD-10-CM | POA: Diagnosis present

## 2011-02-08 DIAGNOSIS — Z86711 Personal history of pulmonary embolism: Secondary | ICD-10-CM

## 2011-02-08 DIAGNOSIS — N19 Unspecified kidney failure: Secondary | ICD-10-CM

## 2011-02-08 DIAGNOSIS — Z951 Presence of aortocoronary bypass graft: Secondary | ICD-10-CM

## 2011-02-08 DIAGNOSIS — D696 Thrombocytopenia, unspecified: Secondary | ICD-10-CM | POA: Diagnosis present

## 2011-02-08 DIAGNOSIS — J449 Chronic obstructive pulmonary disease, unspecified: Secondary | ICD-10-CM | POA: Diagnosis present

## 2011-02-08 DIAGNOSIS — R112 Nausea with vomiting, unspecified: Secondary | ICD-10-CM

## 2011-02-08 DIAGNOSIS — E785 Hyperlipidemia, unspecified: Secondary | ICD-10-CM

## 2011-02-08 DIAGNOSIS — E876 Hypokalemia: Secondary | ICD-10-CM | POA: Diagnosis present

## 2011-02-08 DIAGNOSIS — E1149 Type 2 diabetes mellitus with other diabetic neurological complication: Secondary | ICD-10-CM | POA: Diagnosis present

## 2011-02-08 DIAGNOSIS — N390 Urinary tract infection, site not specified: Secondary | ICD-10-CM | POA: Diagnosis present

## 2011-02-08 DIAGNOSIS — I2510 Atherosclerotic heart disease of native coronary artery without angina pectoris: Secondary | ICD-10-CM | POA: Diagnosis present

## 2011-02-08 DIAGNOSIS — I10 Essential (primary) hypertension: Secondary | ICD-10-CM

## 2011-02-08 DIAGNOSIS — R109 Unspecified abdominal pain: Secondary | ICD-10-CM | POA: Diagnosis present

## 2011-02-08 DIAGNOSIS — I509 Heart failure, unspecified: Secondary | ICD-10-CM

## 2011-02-08 DIAGNOSIS — E538 Deficiency of other specified B group vitamins: Secondary | ICD-10-CM | POA: Diagnosis present

## 2011-02-08 DIAGNOSIS — M899 Disorder of bone, unspecified: Secondary | ICD-10-CM | POA: Diagnosis present

## 2011-02-08 DIAGNOSIS — M109 Gout, unspecified: Secondary | ICD-10-CM | POA: Diagnosis not present

## 2011-02-08 DIAGNOSIS — E875 Hyperkalemia: Secondary | ICD-10-CM | POA: Diagnosis not present

## 2011-02-08 DIAGNOSIS — K279 Peptic ulcer, site unspecified, unspecified as acute or chronic, without hemorrhage or perforation: Secondary | ICD-10-CM | POA: Diagnosis present

## 2011-02-08 DIAGNOSIS — Z86718 Personal history of other venous thrombosis and embolism: Secondary | ICD-10-CM

## 2011-02-08 DIAGNOSIS — N184 Chronic kidney disease, stage 4 (severe): Secondary | ICD-10-CM | POA: Diagnosis present

## 2011-02-08 DIAGNOSIS — I129 Hypertensive chronic kidney disease with stage 1 through stage 4 chronic kidney disease, or unspecified chronic kidney disease: Secondary | ICD-10-CM | POA: Diagnosis present

## 2011-02-08 DIAGNOSIS — I739 Peripheral vascular disease, unspecified: Secondary | ICD-10-CM

## 2011-02-08 DIAGNOSIS — I5022 Chronic systolic (congestive) heart failure: Secondary | ICD-10-CM

## 2011-02-08 DIAGNOSIS — Z79899 Other long term (current) drug therapy: Secondary | ICD-10-CM

## 2011-02-08 DIAGNOSIS — E1142 Type 2 diabetes mellitus with diabetic polyneuropathy: Secondary | ICD-10-CM | POA: Diagnosis present

## 2011-02-08 DIAGNOSIS — J4489 Other specified chronic obstructive pulmonary disease: Secondary | ICD-10-CM | POA: Diagnosis present

## 2011-02-08 DIAGNOSIS — Z87891 Personal history of nicotine dependence: Secondary | ICD-10-CM

## 2011-02-08 DIAGNOSIS — G43009 Migraine without aura, not intractable, without status migrainosus: Secondary | ICD-10-CM | POA: Diagnosis present

## 2011-02-08 HISTORY — DX: Heart failure, unspecified: I50.9

## 2011-02-08 LAB — DIFFERENTIAL
Basophils Absolute: 0 10*3/uL (ref 0.0–0.1)
Eosinophils Absolute: 0 10*3/uL (ref 0.0–0.7)
Lymphs Abs: 0.7 10*3/uL (ref 0.7–4.0)
Neutro Abs: 7.9 10*3/uL — ABNORMAL HIGH (ref 1.7–7.7)

## 2011-02-08 LAB — CBC
HCT: 38.1 % (ref 36.0–46.0)
MCH: 28.5 pg (ref 26.0–34.0)
MCHC: 34.4 g/dL (ref 30.0–36.0)
MCHC: 34 g/dL (ref 30.0–36.0)
MCV: 83.9 fL (ref 78.0–100.0)
Platelets: 109 10*3/uL — ABNORMAL LOW (ref 150–400)
RDW: 15.6 % — ABNORMAL HIGH (ref 11.5–15.5)
RDW: 15.6 % — ABNORMAL HIGH (ref 11.5–15.5)

## 2011-02-08 LAB — MRSA PCR SCREENING: MRSA by PCR: NEGATIVE

## 2011-02-08 LAB — CARDIAC PANEL(CRET KIN+CKTOT+MB+TROPI)
CK, MB: 3.8 ng/mL (ref 0.3–4.0)
Relative Index: 1 (ref 0.0–2.5)
Troponin I: <0.3 ng/mL (ref <0.30)

## 2011-02-08 LAB — CLOSTRIDIUM DIFFICILE BY PCR: Toxigenic C. Difficile by PCR: NEGATIVE

## 2011-02-08 LAB — COMPREHENSIVE METABOLIC PANEL
BUN: 65 mg/dL — ABNORMAL HIGH (ref 6–23)
CO2: 31 mEq/L (ref 19–32)
Calcium: 7.3 mg/dL — ABNORMAL LOW (ref 8.4–10.5)
Creatinine, Ser: 4.5 mg/dL — ABNORMAL HIGH (ref 0.50–1.10)
GFR calc non Af Amer: 9 mL/min — ABNORMAL LOW (ref >90)
Glucose, Bld: 99 mg/dL (ref 70–99)

## 2011-02-08 LAB — BASIC METABOLIC PANEL
BUN: 67 mg/dL — ABNORMAL HIGH (ref 6–23)
Calcium: 7.2 mg/dL — ABNORMAL LOW (ref 8.4–10.5)
GFR calc Af Amer: 9 mL/min — ABNORMAL LOW (ref >90)
GFR calc non Af Amer: 8 mL/min — ABNORMAL LOW (ref >90)
Potassium: 3.4 mEq/L — ABNORMAL LOW (ref 3.5–5.1)
Sodium: 133 mEq/L — ABNORMAL LOW (ref 135–145)

## 2011-02-08 LAB — LACTIC ACID, PLASMA: Lactic Acid, Venous: 1.8 mmol/L (ref 0.5–2.2)

## 2011-02-08 LAB — PHOSPHORUS: Phosphorus: 5.9 mg/dL — ABNORMAL HIGH (ref 2.3–4.6)

## 2011-02-08 MED ORDER — FLUOXETINE HCL 10 MG PO CAPS
10.0000 mg | ORAL_CAPSULE | Freq: Every day | ORAL | Status: DC
  Administered 2011-02-08 – 2011-02-13 (×6): 10 mg via ORAL
  Filled 2011-02-08 (×6): qty 1

## 2011-02-08 MED ORDER — SODIUM CHLORIDE 0.9 % IV SOLN
Freq: Once | INTRAVENOUS | Status: AC
  Administered 2011-02-08: 23:00:00 via INTRAVENOUS

## 2011-02-08 MED ORDER — POTASSIUM CHLORIDE 10 MEQ/100ML IV SOLN
10.0000 meq | Freq: Once | INTRAVENOUS | Status: AC
  Administered 2011-02-08: 5 meq via INTRAVENOUS

## 2011-02-08 MED ORDER — MOMETASONE FURO-FORMOTEROL FUM 200-5 MCG/ACT IN AERO
2.0000 | INHALATION_SPRAY | Freq: Two times a day (BID) | RESPIRATORY_TRACT | Status: DC
  Filled 2011-02-08 (×4): qty 1

## 2011-02-08 MED ORDER — FERROUS SULFATE 325 (65 FE) MG PO TABS
325.0000 mg | ORAL_TABLET | Freq: Every day | ORAL | Status: DC
  Administered 2011-02-09 – 2011-02-13 (×5): 325 mg via ORAL
  Filled 2011-02-08 (×6): qty 1

## 2011-02-08 MED ORDER — POTASSIUM CHLORIDE CRYS ER 20 MEQ PO TBCR
40.0000 meq | EXTENDED_RELEASE_TABLET | Freq: Once | ORAL | Status: AC
  Administered 2011-02-08: 40 meq via ORAL
  Filled 2011-02-08: qty 2

## 2011-02-08 MED ORDER — TIOTROPIUM BROMIDE MONOHYDRATE 18 MCG IN CAPS
18.0000 ug | ORAL_CAPSULE | Freq: Every day | RESPIRATORY_TRACT | Status: DC
  Administered 2011-02-09 – 2011-02-13 (×5): 18 ug via RESPIRATORY_TRACT
  Filled 2011-02-08: qty 5

## 2011-02-08 MED ORDER — SODIUM CHLORIDE 0.9 % IV BOLUS (SEPSIS)
500.0000 mL | Freq: Once | INTRAVENOUS | Status: AC
  Administered 2011-02-08: 1000 mL via INTRAVENOUS

## 2011-02-08 MED ORDER — HYDRALAZINE HCL 10 MG PO TABS
10.0000 mg | ORAL_TABLET | Freq: Three times a day (TID) | ORAL | Status: DC
  Administered 2011-02-08 – 2011-02-13 (×15): 10 mg via ORAL
  Filled 2011-02-08 (×17): qty 1

## 2011-02-08 MED ORDER — TORSEMIDE 20 MG PO TABS
20.0000 mg | ORAL_TABLET | Freq: Every day | ORAL | Status: DC
  Filled 2011-02-08 (×2): qty 1

## 2011-02-08 MED ORDER — ONDANSETRON HCL 4 MG/2ML IJ SOLN
4.0000 mg | Freq: Once | INTRAMUSCULAR | Status: AC
  Administered 2011-02-08: 4 mg via INTRAVENOUS
  Filled 2011-02-08: qty 2

## 2011-02-08 MED ORDER — ENOXAPARIN SODIUM 30 MG/0.3ML ~~LOC~~ SOLN
30.0000 mg | SUBCUTANEOUS | Status: DC
  Administered 2011-02-08 – 2011-02-12 (×5): 30 mg via SUBCUTANEOUS
  Filled 2011-02-08 (×6): qty 0.3

## 2011-02-08 MED ORDER — HYDROCODONE-ACETAMINOPHEN 5-325 MG PO TABS: 1.0000 | ORAL_TABLET | ORAL | Status: DC | PRN

## 2011-02-08 MED ORDER — ISOSORBIDE MONONITRATE ER 30 MG PO TB24
30.0000 mg | ORAL_TABLET | Freq: Every day | ORAL | Status: DC
  Administered 2011-02-08 – 2011-02-13 (×6): 30 mg via ORAL
  Filled 2011-02-08 (×6): qty 1

## 2011-02-08 MED ORDER — PANTOPRAZOLE SODIUM 40 MG PO TBEC
40.0000 mg | DELAYED_RELEASE_TABLET | Freq: Every day | ORAL | Status: DC
  Administered 2011-02-08 – 2011-02-13 (×6): 40 mg via ORAL
  Filled 2011-02-08 (×5): qty 1

## 2011-02-08 MED ORDER — SENNOSIDES-DOCUSATE SODIUM 8.6-50 MG PO TABS
1.0000 | ORAL_TABLET | Freq: Every day | ORAL | Status: DC | PRN
  Filled 2011-02-08: qty 1

## 2011-02-08 MED ORDER — SODIUM CHLORIDE 0.9 % IJ SOLN
3.0000 mL | Freq: Two times a day (BID) | INTRAMUSCULAR | Status: DC
  Administered 2011-02-08 – 2011-02-13 (×8): 3 mL via INTRAVENOUS

## 2011-02-08 MED ORDER — LATANOPROST 0.005 % OP SOLN
1.0000 [drp] | Freq: Every day | OPHTHALMIC | Status: DC
  Administered 2011-02-08 – 2011-02-12 (×4): 1 [drp] via OPHTHALMIC
  Filled 2011-02-08 (×2): qty 2.5

## 2011-02-08 MED ORDER — ALBUTEROL SULFATE HFA 108 (90 BASE) MCG/ACT IN AERS
2.0000 | INHALATION_SPRAY | RESPIRATORY_TRACT | Status: DC | PRN
  Filled 2011-02-08: qty 6.7

## 2011-02-08 MED ORDER — PNEUMOCOCCAL VAC POLYVALENT 25 MCG/0.5ML IJ INJ
0.5000 mL | INJECTION | INTRAMUSCULAR | Status: AC
  Administered 2011-02-09: 0.5 mL via INTRAMUSCULAR
  Filled 2011-02-08: qty 0.5

## 2011-02-08 MED ORDER — ONDANSETRON HCL 4 MG/2ML IJ SOLN
INTRAMUSCULAR | Status: AC
  Administered 2011-02-08: 4 mg via INTRAVENOUS
  Filled 2011-02-08: qty 2

## 2011-02-08 MED ORDER — METOPROLOL SUCCINATE ER 25 MG PO TB24
25.0000 mg | ORAL_TABLET | Freq: Every day | ORAL | Status: DC
  Administered 2011-02-08 – 2011-02-13 (×6): 25 mg via ORAL
  Filled 2011-02-08 (×6): qty 1

## 2011-02-08 MED ORDER — NITROGLYCERIN 0.4 MG SL SUBL: 0.4000 mg | SUBLINGUAL_TABLET | SUBLINGUAL | Status: DC | PRN

## 2011-02-08 MED ORDER — ONDANSETRON HCL 4 MG PO TABS
4.0000 mg | ORAL_TABLET | Freq: Three times a day (TID) | ORAL | Status: DC | PRN
  Filled 2011-02-08 (×2): qty 1

## 2011-02-08 MED ORDER — ALBUTEROL SULFATE (5 MG/ML) 0.5% IN NEBU: 2.5000 mg | INHALATION_SOLUTION | RESPIRATORY_TRACT | Status: DC | PRN

## 2011-02-08 MED ORDER — ACETAMINOPHEN 325 MG PO TABS: 650.0000 mg | ORAL_TABLET | Freq: Four times a day (QID) | ORAL | Status: DC | PRN

## 2011-02-08 MED ORDER — MIRTAZAPINE 7.5 MG PO TABS
7.5000 mg | ORAL_TABLET | Freq: Every evening | ORAL | Status: DC | PRN
  Administered 2011-02-08: 7.5 mg via ORAL
  Filled 2011-02-08: qty 1

## 2011-02-08 MED ORDER — SODIUM CHLORIDE 0.9 % IV SOLN: 250.0000 mL | INTRAVENOUS | Status: DC

## 2011-02-08 MED ORDER — ACETAMINOPHEN 650 MG RE SUPP: 650.0000 mg | Freq: Four times a day (QID) | RECTAL | Status: DC | PRN

## 2011-02-08 MED ORDER — SODIUM CHLORIDE 0.9 % IJ SOLN: 3.0000 mL | INTRAMUSCULAR | Status: DC | PRN

## 2011-02-08 MED ORDER — METOLAZONE 5 MG PO TABS
5.0000 mg | ORAL_TABLET | ORAL | Status: DC
  Administered 2011-02-09 – 2011-02-12 (×3): 5 mg via ORAL
  Filled 2011-02-08 (×4): qty 1

## 2011-02-08 MED ORDER — EZETIMIBE-SIMVASTATIN 10-20 MG PO TABS
1.0000 | ORAL_TABLET | Freq: Every day | ORAL | Status: DC
  Administered 2011-02-08 – 2011-02-12 (×5): 1 via ORAL
  Filled 2011-02-08 (×6): qty 1

## 2011-02-08 NOTE — ED Notes (Signed)
Patient transported to Ultrasound 

## 2011-02-08 NOTE — ED Notes (Signed)
Pt transported to Ultrasound at 1720.

## 2011-02-08 NOTE — ED Notes (Signed)
Old ecg &new ecg printed shown to DR, [facility]

## 2011-02-08 NOTE — H&P (Addendum)
PCP:  Hannah Beat, MD, MD   DOA:  02/08/2011  9:09 AM  Chief Complaint:  Worsening abdominal pain  HPI: Patient presents via EMS with epigastric and periumbilical abdominal pain, non radiating, and 5/10 in severity, associated with generalized weakness, nausea, and decreased appetite for several weeks, which has been getting progressively worse. She saw her cardiologist Dr. Excell Seltzer 1 day prior to admission. She has a history of remote aortobifemoral bypass, coronary bypass surgery, diastolic heart failure, chronic kidney disease, DVT/pulmonary embolus with inability to anticoagulate because of chronic GI bleeding. She has had an IVC filter placed. She also has had bilateral renal stents.  She denies any vomiting, chest pain, but reports chronic shortness of breath and 3 pillow orthopnea. She denies fevers, chills, urinary concerns such as dysuria or hematuria, frequency and urgency. She has had significant weight loss in the past year but is not sure how much weight she has actually lost. She denies any change in bowel habits. Per Dr. Earmon Phoenix note she has a significant history of vascular disease and mesenteric ischemia as a concern.   Allergies: Allergies  Allergen Reactions  . Meperidine Hcl   . Niacin     REACTION: rash  . Penicillins   . Sulfonamide Derivatives   . Codeine Rash  . Morphine Rash  . Propoxyphene Hcl Rash    Prior to Admission medications   Medication Sig Start Date End Date Taking? Authorizing Provider  amLODipine (NORVASC) 10 MG tablet Take 1 tablet (10 mg total) by mouth daily. 10/16/10  Yes Spencer Copland, MD  COLCRYS 0.6 MG tablet TAKE 1 TABLET (0.6 MG TOTAL) BY MOUTH 2 (TWO) TIMES DAILY. 12/31/10  Yes Spencer Copland, MD  ezetimibe-simvastatin (VYTORIN) 10-20 MG per tablet Take 1 tablet by mouth at bedtime.     Yes Historical Provider, MD  ferrous sulfate 325 (65 FE) MG tablet Take 325 mg by mouth daily with breakfast.     Yes Historical Provider, MD  FLUoxetine  (PROZAC) 10 MG capsule Take 10 mg by mouth daily.     Yes Historical Provider, MD  hydrALAZINE (APRESOLINE) 10 MG tablet Take 10 mg by mouth Three times a day. 01/14/11  Yes Historical Provider, MD  isosorbide mononitrate (IMDUR) 30 MG 24 hr tablet Take 1 tablet (30 mg total) by mouth daily. 07/26/10  Yes Spencer Copland, MD  KLOR-CON 10 10 MEQ CR tablet Take 1 tablet by mouth daily.  01/05/11  Yes Historical Provider, MD  latanoprost (XALATAN) 0.005 % ophthalmic solution Place 1 drop into both eyes daily.     Yes Historical Provider, MD  metolazone (ZAROXOLYN) 5 MG tablet Take 5 mg by mouth 4 (four) times a week. Monday, Wednesday, Friday, and Saturdays     Yes Historical Provider, MD  metoprolol (TOPROL-XL) 50 MG 24 hr tablet Take 25 mg by mouth daily.    Yes Historical Provider, MD  mirtazapine (REMERON) 15 MG tablet TAKE 1 TABLET BY MOUTH AT BEDTIME 12/19/10  Yes Spencer Copland, MD  Mometasone Furo-Formoterol Fum (DULERA) 200-5 MCG/ACT AERO Inhale 2 puffs into the lungs 2 (two) times daily. 09/06/10  Yes Spencer Copland, MD  NON FORMULARY 2 L/min by Nasal route continuous. oxygen    Yes Historical Provider, MD  omeprazole (PRILOSEC) 40 MG capsule Take 40 mg by mouth daily.     Yes Historical Provider, MD  ondansetron (ZOFRAN) 4 MG tablet 4 mg every 8 (eight) hours as needed. 1 tab 30 minutes before meals three times a day for  nausea  01/30/11  Yes Spencer Copland, MD  tiotropium (SPIRIVA) 18 MCG inhalation capsule Place 18 mcg into inhaler and inhale daily.     Yes Historical Provider, MD  torsemide (DEMADEX) 20 MG tablet Take 1 tablet (20 mg total) by mouth 2 (two) times daily. 01/19/11  Yes Spencer Copland, MD  albuterol (VENTOLIN HFA) 108 (90 BASE) MCG/ACT inhaler Inhale 2 puffs into the lungs every 4 (four) hours as needed. For wheeze or shortness of breath    Historical Provider, MD  nitroGLYCERIN (NITROSTAT) 0.4 MG SL tablet Place 0.4 mg under the tongue every 5 (five) minutes as needed. For  chest pain  12/25/10   Micheline Chapman, MD    Past Medical History  Diagnosis Date  . Diabetes mellitus   . Diverticulosis   . Osteopenia   . PUD (peptic ulcer disease)   . Peripheral arterial disease   . Hypertension   . Hyperlipidemia   . Diastolic heart failure     a. echo 9/11: EF 60-65%, mild LVH, grade 2 diast dysfxn, AV mean gradient 9 mmHg, mild MR, RVE, RVSP 59  . Coronary artery disease     a. s/p CABG;  b. cath 8/09: LM occluded; RCA 50%; L-LAD ok, Radial-OM ok; bilat renal art stenosis  . Renovascular hypertension   . Renal artery stenosis     a. s/p L RA stent 8/09  . Paroxysmal a-fib     no coumadin 2/2 GI Blee  . DVT (deep venous thrombosis)   . Pulmonary embolus     s/p IVC filter  . History of gastrointestinal bleeding     no ASA or coumadin  . Contrast dye induced nephropathy   . Orthostatic hypotension   . CHF (congestive heart failure)   . COPD (chronic obstructive pulmonary disease)     Past Surgical History  Procedure Date  . Appendectomy   . Cholecystectomy   . Abdominal hysterectomy   . Coronary artery bypass graft 09-04-2001    carotid u/s unchnaged 40-50% ICA stenosis 12/1998  . Cataract extraction, bilateral 2001  . Abdominal aortic aneurysm repair 1997  . Nm myoview ltd     neg EF 76%--06/26/2004  . Carotid doppler     12/05 unchanged 9-06  . Cardiac catheterization 09-03-01  . Esophagogastroduodenoscopy     abnormal 2007, esophageal stricture 10-12-03-07 gastritis and major bleef  . Colonoscopy     abnormal 03-13-06  . Vena cava filter placement 07-2007    Dr. Excell Seltzer  . Renal artery doppler 11-17-2008    severe iliac stenosis    Social History:  reports that she quit smoking about 20 years ago. Her smoking use included Cigarettes. She has a 40 pack-year smoking history. She does not have any smokeless tobacco history on file. She reports that she does not drink alcohol or use illicit drugs.  Family History  Problem Relation Age of Onset    . Cancer Mother   . Stroke Father   . Stroke Brother   . Heart attack Brother   . Heart attack Brother   . Heart attack Brother     Review of Systems:  Constitutional: Denies fever, chills, diaphoresis. HEENT: Denies photophobia, eye pain, redness, hearing loss, ear pain, congestion, sore throat, rhinorrhea, sneezing, mouth sores, trouble swallowing, neck pain, neck stiffness and tinnitus.   Respiratory: Denies cough, chest tightness,  and wheezing.   Cardiovascular: Denies chest pain, palpitations and leg swelling.  Genitourinary: Denies dysuria, urgency, frequency, hematuria, flank pain  and difficulty urinating.  Musculoskeletal: Denies myalgias, back pain, joint swelling, arthralgias and gait problem.  Skin: Denies pallor, rash and wound.  Neurological: Denies dizziness, seizures, syncope, weakness, light-headedness, numbness and headaches.  Hematological: Denies adenopathy. Easy bruising, personal or family bleeding history  Psychiatric/Behavioral: Denies suicidal ideation, mood changes, confusion, nervousness, sleep disturbance and agitation  Physical Exam:  Filed Vitals:   02/08/11 1023 02/08/11 1201 02/08/11 1337 02/08/11 1407  BP: 96/31 131/26 153/34 141/36  Pulse: 82 62 91 78  Temp:  97.8 F (36.6 C)  97.8 F (36.6 C)  TempSrc:  Oral  Oral  Resp:  18 18 16   SpO2:  100% 100% 100%    Constitutional: Vital signs reviewed.  Patient is well-nourished in no acute distress and cooperative with exam. Alert and oriented x3.  Head: Normocephalic and atraumatic Ear: TM normal bilaterally Mouth: no erythema or exudates, dry mucous membranes Eyes: PERRL, EOMI, conjunctivae normal, No scleral icterus.  Neck: Supple, Trachea midline normal ROM, No JVD, mass, thyromegaly, or carotid bruit present.  Cardiovascular: IRRR, no MRG, pulses symmetric and intact bilaterally, no JVD, no carotid bruits Pulmonary/Chest: CTAB with minimal bibasilar crackles and slightly decreased sounds at  the bases, no wheezing Abdominal: Soft. Periumbilical tenderness, non-distended, bowel sounds are normal, no masses, organomegaly, or guarding present.  GU: no CVA tenderness Musculoskeletal: No joint deformities, erythema, or stiffness, ROM full and nontender Ext: +1 bilateral pitting edema, no cyanosis, pulses palpable bilaterally (DP and PT) Hematology: no cervical, inginal, or axillary adenopathy.  Neurological: A&O x3, Strenght is normal and symmetric bilaterally, cranial nerve II-XII are grossly intact, no focal motor deficit, sensory intact to light touch bilaterally.  Skin: Warm, dry and intact. No rash, cyanosis, or clubbing.  Psychiatric: Normal mood and affect. speech and behavior is normal. Judgment and thought content normal. Cognition and memory are normal.   Labs on Admission:  Results for orders placed during the hospital encounter of 02/08/11 (from the past 48 hour(s))  LACTIC ACID, PLASMA     Status: Normal   Collection Time   02/08/11  9:50 AM      Component Value Range Comment   Lactic Acid, Venous 1.8  0.5 - 2.2 (mmol/L)   CBC     Status: Abnormal   Collection Time   02/08/11  9:50 AM      Component Value Range Comment   WBC 9.2  4.0 - 10.5 (K/uL)    RBC 4.54  3.87 - 5.11 (MIL/uL)    Hemoglobin 13.1  12.0 - 15.0 (g/dL)    HCT 16.1  09.6 - 04.5 (%)    MCV 83.9  78.0 - 100.0 (fL)    MCH 28.9  26.0 - 34.0 (pg)    MCHC 34.4  30.0 - 36.0 (g/dL)    RDW 40.9 (*) 81.1 - 15.5 (%)    Platelets 109 (*) 150 - 400 (K/uL) PLATELET COUNT CONFIRMED BY SMEAR  DIFFERENTIAL     Status: Abnormal   Collection Time   02/08/11  9:50 AM      Component Value Range Comment   Neutrophils Relative 86 (*) 43 - 77 (%)    Lymphocytes Relative 8 (*) 12 - 46 (%)    Monocytes Relative 6  3 - 12 (%)    Eosinophils Relative 0  0 - 5 (%)    Basophils Relative 0  0 - 1 (%)    Neutro Abs 7.9 (*) 1.7 - 7.7 (K/uL)    Lymphs Abs 0.7  0.7 - 4.0 (K/uL)    Monocytes Absolute 0.6  0.1 - 1.0 (K/uL)     Eosinophils Absolute 0.0  0.0 - 0.7 (K/uL)    Basophils Absolute 0.0  0.0 - 0.1 (K/uL)    RBC Morphology ELLIPTOCYTES      WBC Morphology ATYPICAL LYMPHOCYTES      Smear Review PLATELET CLUMPS NOTED ON SMEAR     COMPREHENSIVE METABOLIC PANEL     Status: Abnormal   Collection Time   02/08/11  9:50 AM      Component Value Range Comment   Sodium 137  135 - 145 (mEq/L)    Potassium 2.9 (*) 3.5 - 5.1 (mEq/L)    Chloride 89 (*) 96 - 112 (mEq/L)    CO2 31  19 - 32 (mEq/L)    Glucose, Bld 99  70 - 99 (mg/dL)    BUN 65 (*) 6 - 23 (mg/dL)    Creatinine, Ser 1.61 (*) 0.50 - 1.10 (mg/dL)    Calcium 7.3 (*) 8.4 - 10.5 (mg/dL)    Total Protein 6.7  6.0 - 8.3 (g/dL)    Albumin 3.7  3.5 - 5.2 (g/dL)    AST 55 (*) 0 - 37 (U/L)    ALT 25  0 - 35 (U/L)    Alkaline Phosphatase 135 (*) 39 - 117 (U/L)    Total Bilirubin 0.8  0.3 - 1.2 (mg/dL)    GFR calc non Af Amer 9 (*) >90 (mL/min)    GFR calc Af Amer 10 (*) >90 (mL/min)   PROTIME-INR     Status: Normal   Collection Time   02/08/11  9:50 AM      Component Value Range Comment   Prothrombin Time 15.0  11.6 - 15.2 (seconds)    INR 1.16  0.00 - 1.49    LIPASE, BLOOD     Status: Normal   Collection Time   02/08/11  9:50 AM      Component Value Range Comment   Lipase 47  11 - 59 (U/L)   CARDIAC PANEL(CRET KIN+CKTOT+MB+TROPI)     Status: Abnormal   Collection Time   02/08/11  9:53 AM      Component Value Range Comment   Total CK 390 (*) 7 - 177 (U/L)    CK, MB 3.8  0.3 - 4.0 (ng/mL)    Troponin I <0.30  <0.30 (ng/mL)    Relative Index 1.0  0.0 - 2.5    PRO B NATRIURETIC PEPTIDE     Status: Abnormal   Collection Time   02/08/11 12:34 PM      Component Value Range Comment   BNP, POC 12240.0 (*) 0 - 450 (pg/mL)   CLOSTRIDIUM DIFFICILE BY PCR     Status: Normal   Collection Time   02/08/11  1:15 PM      Component Value Range Comment   C difficile by pcr NEGATIVE  NEGATIVE      Radiological Exams on Admission: No results  found.  Assessment/Plan Principal Problem:  *Abdominal pain - worrisome for mesenteric ischemia but lactic acid is 1.8 which is WNL. Imaging studies pending at this time. Continue supportive care for now. Hold off on IVF fluids until we evaluate for pulmonary congestion.  Pt looks clinically dry rather than volume overloaded and has received 1 L bolus of NS in ED. Currently fluids running at 75 cc/hr. Continue analgesia for pain control and initiate medication for bowel regimen.   Active Problems:  Chronic systolic heart  failure - continue zaroxolyn and follow up on CXR which is still pending. Obtain 2D ECHO. Follow daily weights and strict I's and O's   CKD (chronic kidney disease) stage 4, GFR 15-29 ml/min - at baseline 1 month ago Cr = 1.0 and one week ago 2.4. This could be prerenal in etiology or progression of kidney disease, possible rhabdomyollysis given elevated CK  which appears to be more of a chronic elevation looking back at the records. Will hold offending agents such as colchicine at this time and will obtain urine Cr and Na for further evaluation. Follow strict I's and O's and obtain renal    [place].   HYPERTENSION - currently at goal. Will continue home medications for now except Norvasc and will add if indicated.   Coronary atherosclerosis of native coronary artery - follow up on 2D ECHO and cycle cardiac enzymes   Atrial fibrillation - rate controlled, continue metoprolol   PEPTIC ULCER DISEASE - continue protonix   Hypokalemia - replete and check Mg level   HYPERLIPIDEMIA - check FLP   GOUT, ACUTE - hold colchicine for now given acute on chronic renal failure    DEPRESSION - continue home medication regimen   Thrombocytopenia - stable and at pt's baseline of 70-100    Time Spent on Admission: Over 30 minutes  MAGICK-Donna Reese 02/08/2011, 3:11 PM

## 2011-02-08 NOTE — ED Notes (Signed)
Patient presents to ed via [facility] states she has had n/v/d onset several days ago. C/o abd.  And feeling weak. States she is only able to keep boost down the past several days.

## 2011-02-08 NOTE — Progress Notes (Signed)
Spiritual Care  Visited with patient and provided emotional support and prayer.  Donna Reese [phone]

## 2011-02-08 NOTE — ED Notes (Signed)
Pt assisted on bedpan to void, pt had small amount of BM with small amount of urine mixed in. Will try to recollect sample at a later time

## 2011-02-08 NOTE — ED Notes (Signed)
In/out cath pt. No urine return.pt. Is having loose stool.

## 2011-02-08 NOTE — Plan of Care (Signed)
Problem: Consults Goal: General Medical Patient Education See Patient Education Module for specific education. Outcome: Progressing Abdomen pain

## 2011-02-08 NOTE — ED Notes (Signed)
Stopped potassium; only 5 meq kcl administered. Dr. Manus Gunning aware.

## 2011-02-08 NOTE — ED Provider Notes (Signed)
History     CSN: [id number] Arrival date & time: 02/08/2011  9:09 AM   First MD Initiated Contact with Patient 02/08/11 [phone]      Chief Complaint  Patient presents with  . Nausea  . Diarrhea    (Consider location/radiation/quality/duration/timing/severity/associated sxs/prior treatment) HPI Comments: Patient presents via EMS with nausea, abdominal pain, generalized weakness and decreased appetite for several weeks. She saw her cardiologist Dr. Excell Seltzer yesterday. She has a history of remote aortobifemoral bypass, coronary bypass surgery, diastolic heart failure, chronic kidney disease, DVT/pulmonary embolus with inability to anticoagulate because of chronic GI bleeding. She has had an IVC filter placed. She also has had bilateral renal stents.  She complains of periumbilical abdominal pain that comes and goes associated with nausea and decreased appetite. She denies any vomiting, chest pain or shortness of breath. She denies any fevers or chills. She has no pain any pain with urination. She has had significant weight loss in the past year and only wants to drink boost and sometimes can keep that down. She denies any change in bowel habits. Per Dr. Earmon Phoenix note she has a significant history of vascular disease and mesenteric ischemia as a concern.  The history is provided by the patient and the EMS personnel.    Past Medical History  Diagnosis Date  . Diabetes mellitus   . Diverticulosis   . Osteopenia   . PUD (peptic ulcer disease)   . Peripheral arterial disease   . Hypertension   . Hyperlipidemia   . Diastolic heart failure     a. echo 9/11: EF 60-65%, mild LVH, grade 2 diast dysfxn, AV mean gradient 9 mmHg, mild MR, RVE, RVSP 59  . Coronary artery disease     a. s/p CABG;  b. cath 8/09: LM occluded; RCA 50%; L-LAD ok, Radial-OM ok; bilat renal art stenosis  . Renovascular hypertension   . Renal artery stenosis     a. s/p L RA stent 8/09  . Paroxysmal a-fib     no coumadin 2/2  GI Blee  . DVT (deep venous thrombosis)   . Pulmonary embolus     s/p IVC filter  . History of gastrointestinal bleeding     no ASA or coumadin  . Contrast dye induced nephropathy   . Orthostatic hypotension   . CHF (congestive heart failure)   . COPD (chronic obstructive pulmonary disease)     Past Surgical History  Procedure Date  . Appendectomy   . Cholecystectomy   . Abdominal hysterectomy   . Coronary artery bypass graft 09-04-2001    carotid u/s unchnaged 40-50% ICA stenosis 12/1998  . Cataract extraction, bilateral 2001  . Abdominal aortic aneurysm repair 1997  . Nm myoview ltd     neg EF 76%--06/26/2004  . Carotid doppler     12/05 unchanged 9-06  . Cardiac catheterization 09-03-01  . Esophagogastroduodenoscopy     abnormal 2007, esophageal stricture 10-12-03-07 gastritis and major bleef  . Colonoscopy     abnormal 03-13-06  . Vena cava filter placement 07-2007    Dr. Excell Seltzer  . Renal artery doppler 11-17-2008    severe iliac stenosis    Family History  Problem Relation Age of Onset  . Cancer Mother   . Stroke Father   . Stroke Brother   . Heart attack Brother   . Heart attack Brother   . Heart attack Brother     History  Substance Use Topics  . Smoking status: Former Smoker -- 1.0 packs/day  for 40 years    Types: Cigarettes    Quit date: 04/01/1990  . Smokeless tobacco: Not on file  . Alcohol Use: No    OB History    Grav Para Term Preterm Abortions TAB SAB Ect Mult Living                  Review of Systems  Constitutional: Positive for chills, activity change, appetite change and fatigue. Negative for fever.  HENT: Negative for congestion and rhinorrhea.   Respiratory: Negative for cough, chest tightness and shortness of breath.   Cardiovascular: Negative for chest pain.  Gastrointestinal: Positive for nausea and abdominal pain. Negative for vomiting, diarrhea and constipation.  Genitourinary: Negative for dysuria and hematuria.  Musculoskeletal:  Negative for back pain.  Neurological: Positive for dizziness, weakness, light-headedness and headaches.    Allergies  Meperidine hcl; Niacin; Penicillins; Sulfonamide derivatives; Codeine; Morphine; and Propoxyphene hcl  Home Medications   Current Outpatient Rx  Name Route Sig Dispense Refill  . AMLODIPINE BESYLATE 10 MG PO TABS Oral Take 1 tablet (10 mg total) by mouth daily. 30 tablet 6  . COLCRYS 0.6 MG PO TABS  TAKE 1 TABLET (0.6 MG TOTAL) BY MOUTH 2 (TWO) TIMES DAILY. 60 tablet 1  . EZETIMIBE-SIMVASTATIN 10-20 MG PO TABS Oral Take 1 tablet by mouth at bedtime.      Marland Kitchen FERROUS SULFATE 325 (65 FE) MG PO TABS Oral Take 325 mg by mouth daily with breakfast.      . FLUOXETINE HCL 10 MG PO CAPS Oral Take 10 mg by mouth daily.      Marland Kitchen HYDRALAZINE HCL 10 MG PO TABS Oral Take 10 mg by mouth Three times a day.    . ISOSORBIDE MONONITRATE ER 30 MG PO TB24 Oral Take 1 tablet (30 mg total) by mouth daily. 30 tablet 11  . KLOR-CON 10 10 MEQ PO TBCR Oral Take 1 tablet by mouth daily.     Marland Kitchen LATANOPROST 0.005 % OP SOLN Both Eyes Place 1 drop into both eyes daily.      Marland Kitchen METOLAZONE 5 MG PO TABS Oral Take 5 mg by mouth 4 (four) times a week. Monday, Wednesday, Friday, and Saturdays      . METOPROLOL SUCCINATE 50 MG PO TB24 Oral Take 25 mg by mouth daily.     Marland Kitchen MIRTAZAPINE 15 MG PO TABS  TAKE 1 TABLET BY MOUTH AT BEDTIME 30 tablet 5  . MOMETASONE FURO-FORMOTEROL FUM 200-5 MCG/ACT IN AERO Inhalation Inhale 2 puffs into the lungs 2 (two) times daily. 1 Inhaler 11  . NON FORMULARY Nasal 2 L/min by Nasal route continuous. oxygen     . OMEPRAZOLE 40 MG PO CPDR Oral Take 40 mg by mouth daily.      Marland Kitchen ONDANSETRON HCL 4 MG PO TABS  4 mg every 8 (eight) hours as needed. 1 tab 30 minutes before meals three times a day for nausea     . TIOTROPIUM BROMIDE MONOHYDRATE 18 MCG IN CAPS Inhalation Place 18 mcg into inhaler and inhale daily.      . TORSEMIDE 20 MG PO TABS Oral Take 1 tablet (20 mg total) by mouth 2 (two)  times daily. 60 tablet 4    NOTE THE CHANGED DOSAGE AND #TABLETS PATIENT IS TO .Marland Kitchen.  . ALBUTEROL SULFATE HFA 108 (90 BASE) MCG/ACT IN AERS Inhalation Inhale 2 puffs into the lungs every 4 (four) hours as needed. For wheeze or shortness of breath    .  NITROGLYCERIN 0.4 MG SL SUBL Sublingual Place 0.4 mg under the tongue every 5 (five) minutes as needed. For chest pain       BP 135/36  Pulse 65  Temp(Src) 97.8 F (36.6 C) (Oral)  Resp 16  SpO2 99%  Physical Exam  Constitutional: She is oriented to person, place, and time. She appears well-developed and well-nourished. No distress.  HENT:  Head: Normocephalic and atraumatic.  Mouth/Throat: Oropharynx is clear and moist. No oropharyngeal exudate.  Eyes: Conjunctivae are normal. Pupils are equal, round, and reactive to light.  Neck: Normal range of motion.  Cardiovascular: Normal rate, regular rhythm and normal heart sounds.   Pulmonary/Chest: Effort normal and breath sounds normal. No respiratory distress.  Abdominal: Soft. There is tenderness. There is no rebound and no guarding.       Mild periumbilical and right lower quadrant tenderness  Musculoskeletal: Normal range of motion. She exhibits no edema and no tenderness.  Neurological: She is alert and oriented to person, place, and time. No cranial nerve deficit.  Skin: Skin is warm.    ED Course  Procedures (including critical care time)  Labs Reviewed  CBC - Abnormal; Notable for the following:    RDW 15.6 (*)    Platelets 109 (*) PLATELET COUNT CONFIRMED BY SMEAR   All other components within normal limits  DIFFERENTIAL - Abnormal; Notable for the following:    Neutrophils Relative 86 (*)    Lymphocytes Relative 8 (*)    Neutro Abs 7.9 (*)    All other components within normal limits  COMPREHENSIVE METABOLIC PANEL - Abnormal; Notable for the following:    Potassium 2.9 (*)    Chloride 89 (*)    BUN 65 (*)    Creatinine, Ser 4.50 (*)    Calcium 7.3 (*)    AST 55 (*)     Alkaline Phosphatase 135 (*)    GFR calc non Af Amer 9 (*)    GFR calc Af Amer 10 (*)    All other components within normal limits  CARDIAC PANEL(CRET KIN+CKTOT+MB+TROPI) - Abnormal; Notable for the following:    Total CK 390 (*)    All other components within normal limits  PRO B NATRIURETIC PEPTIDE - Abnormal; Notable for the following:    BNP, POC 12240.0 (*)    All other components within normal limits  LACTIC ACID, PLASMA  PROTIME-INR  LIPASE, BLOOD  CLOSTRIDIUM DIFFICILE BY PCR  URINALYSIS, ROUTINE W REFLEX MICROSCOPIC  OCCULT BLOOD X 1 CARD TO LAB, STOOL   Ct Abdomen Pelvis Wo Contrast  02/08/2011  *RADIOLOGY REPORT*  Clinical Data: Nausea.  Weakness.  Diabetic.  High blood pressure. History of pulmonary embolus and deep venous thrombosis.  Prior appendectomy, cholecystectomy, hysterectomy and abdominal aortic aneurysm repair.  CT ABDOMEN AND PELVIS WITHOUT CONTRAST  Technique:  Multidetector CT imaging of the abdomen and pelvis was performed following the standard protocol without intravenous contrast.  Comparison: 11/28/2010.  Findings: Prominent coronary artery calcifications.  Cardiomegaly.  Significant atherosclerotic type changes in calcification all visualized vascular structures.  Post abdominal bifemoral bypass without complication noted.  Inferior vena cava filter is in place.  Noncontrast imaging reveals a small calcification within the liver otherwise no focal hepatic lesion.  Tiny calcification within the spleen otherwise no lesion noted.  Diffuse fatty replacement of the pancreas without surrounding inflammation.  No adrenal lesion.  No hydronephrosis.  Left renal sub centimeter low density structure probably a cyst although incompletely assessed without change.  Post  hysterectomy and cholecystectomy.  Small hiatal hernia.  Sigmoid colon colonic diverticula with prominent muscular hypertrophy. The minimal haziness of fat planes surrounding the sigmoid colon are unchanged from  the prior exam.  No definitive findings of diverticulitis.  No free fluid or free intraperitoneal air.  Portion of the jejunum with apparent slight wall thickening which may be related to under distension rather than true abnormality (series 2 image 24).    If there are any progressive symptoms this can be reevaluated follow-up.  Hip joint degenerative changes greater left.  The bones are osteopenic. Chronic compression fracture of the L1 vertebral body with mild kyphosis and retropulsion unchanged from prior exam.  IMPRESSION: Sigmoid colon colonic diverticula with prominent muscular hypertrophy. The minimal haziness of fat planes surrounding the sigmoid colon are unchanged from the prior exam.  No definitive findings of acute diverticulitis.  Portion of the jejunum with apparent slight wall thickening which may be related to under distension rather than true abnormality (series 2 image 24).    If there are any progressive symptoms this can be reevaluated follow-up.  Prominent atherosclerotic type changes as noted above.  Original Report Authenticated By: Fuller Canada, M.D.   Ct Head Wo Contrast  02/08/2011  *RADIOLOGY REPORT*  Clinical Data: Headache.  Nausea.  Generalized weakness.  History of diabetes, hypertension, and coronary artery disease.  CT HEAD WITHOUT CONTRAST 02/08/2011:  Technique:  Contiguous axial images were obtained from the base of the skull through the vertex without contrast.  Comparison: Unenhanced cranial CT 01/29/2010 and 06/28/2007 [facility].  MRI brain 05/29/2008 [facility] [address].  Findings: Mild to moderate age appropriate cortical atrophy. Moderate changes of small vessel disease of the white matter diffusely, unchanged. Ventricular system normal in size and appearance for age.  No mass lesion.  No midline shift.  No acute hemorrhage or hematoma.  No extra-axial fluid collections.  No evidence of acute infarction.  No focal osseous abnormality involving  the skull.  Mild hyperostosis frontalis interna. Visualized paranasal sinuses, mastoid air cells, and middle ear cavities well-aerated.  Extensive bilateral carotid siphon and vertebral artery atherosclerosis. Note again made of dense calcification involving the choroid plexus of the lateral ventricles and in the foramina of L[place]chka.  IMPRESSION:  1.  No acute intracranial abnormality. 2.  Stable mild to moderate cortical atrophy and moderate chronic microvascular ischemic changes of the white matter.  Original Report Authenticated By: Arnell Sieving, M.D.   US Renal  02/08/2011  The *RADIOLOGY REPORT*  Clinical Data: Acute on chronic renal failure.  RENAL/URINARY TRACT ULTRASOUND COMPLETE  Comparison:  CT scan 02/18/2011.Prior ultrasound 11/02/2010.  Findings:  Right Kidney:  9.2 cm in length.  Stable renal cortical thinning without focal mass or hydronephrosis.  Extensive arterial calcifications are noted.  Left Kidney:  9.9 cm in length.  Renal cortical thinning is stable. No hydronephrosis or renal mass.  Extensive renal artery calcifications.  A simple appearing 1.1 cm cyst is noted.  Bladder:  Decompressed but grossly normal.  IMPRESSION: Renal cortical thinning and extensive renal artery calcifications but no hydronephrosis.  Original Report Authenticated By: P. Loralie Champagne, M.D.   Dg Chest Portable 1 View  02/08/2011  *RADIOLOGY REPORT*  Clinical Data: Chest pain, vomiting, short of breath  PORTABLE CHEST - 1 VIEW  Comparison:  chest radiograph 11/28/2010  Findings: Sternotomy wires overlie stable mildly enlarged heart silhouette with ectatic aorta.  Chronic elevation of left hemi diaphragm is stable.  No effusion, infiltrate,  or pneumothorax.  IMPRESSION: No acute cardiopulmonary process.  Original Report Authenticated By: Genevive Bi, M.D.     1. Renal failure   2. Hypokalemia   3. CHF (congestive heart failure)   4. Generalized weakness       MDM  Generalized weakness,  fatigue, nausea and abdominal pain. Patient with multiple comorbidities including vascular disease, renal failure and coronary artery disease.  Will obtain imaging of abdomen given pain, ARF, vascular disease to rule out obstructive cause of ARF.  Tenuous fluid status given CHF but no rhonchi and lungs clear on CXR.  Will continue hydration.  Admission discussed with Dr. Izola Price for ARF, hypokalemia, generalized weakness, orthostatic hypotension.   Date: 02/08/2011  Rate: 58  Rhythm: normal sinus rhythm  QRS Axis: normal  Intervals: normal  ST/T Wave abnormalities: nonspecific ST/T changes  Conduction Disutrbances:none  Narrative Interpretation: new Q waves anterior leads  Old EKG Reviewed: changes noted          Glynn Octave, MD 02/08/11 1819

## 2011-02-08 NOTE — Telephone Encounter (Signed)
Agree with need for IV fluids likely. To ER.

## 2011-02-08 NOTE — Telephone Encounter (Signed)
Patient neighbor called saying patient has been throwing up since yesterday and she is now very weak and can not come in for labs. Patient advised if not keeping anything in her stomach is probably dyhadrated. Advised ER probably the best bet. Patient granddaughter advised and she said for neighbor to call ambulance.

## 2011-02-08 NOTE — ED Notes (Signed)
DR. Aware of delay in oral contrast

## 2011-02-09 DIAGNOSIS — N390 Urinary tract infection, site not specified: Secondary | ICD-10-CM | POA: Diagnosis present

## 2011-02-09 LAB — BASIC METABOLIC PANEL
BUN: 72 mg/dL — ABNORMAL HIGH (ref 6–23)
BUN: 73 mg/dL — ABNORMAL HIGH (ref 6–23)
CO2: 26 mEq/L (ref 19–32)
Calcium: 7 mg/dL — ABNORMAL LOW (ref 8.4–10.5)
Chloride: 94 mEq/L — ABNORMAL LOW (ref 96–112)
Creatinine, Ser: 5.37 mg/dL — ABNORMAL HIGH (ref 0.50–1.10)
GFR calc Af Amer: 9 mL/min — ABNORMAL LOW (ref >90)
Glucose, Bld: 117 mg/dL — ABNORMAL HIGH (ref 70–99)
Potassium: 3.3 mEq/L — ABNORMAL LOW (ref 3.5–5.1)
Sodium: 137 mEq/L (ref 135–145)

## 2011-02-09 LAB — CBC
HCT: 34.7 % — ABNORMAL LOW (ref 36.0–46.0)
MCH: 28.4 pg (ref 26.0–34.0)
MCHC: 33.7 g/dL (ref 30.0–36.0)
MCV: 84.2 fL (ref 78.0–100.0)
Platelets: 120 10*3/uL — ABNORMAL LOW (ref 150–400)
RDW: 15.8 % — ABNORMAL HIGH (ref 11.5–15.5)
WBC: 10.7 10*3/uL — ABNORMAL HIGH (ref 4.0–10.5)

## 2011-02-09 LAB — CARDIAC PANEL(CRET KIN+CKTOT+MB+TROPI)
CK, MB: 3.7 ng/mL (ref 0.3–4.0)
Relative Index: 1.3 (ref 0.0–2.5)
Total CK: 295 U/L — ABNORMAL HIGH (ref 7–177)
Total CK: 305 U/L — ABNORMAL HIGH (ref 7–177)
Troponin I: <0.3 ng/mL (ref <0.30)
Troponin I: <0.3 ng/mL (ref <0.30)

## 2011-02-09 LAB — URINALYSIS, ROUTINE W REFLEX MICROSCOPIC
Ketones, ur: 15 mg/dL — AB
Protein, ur: 100 mg/dL — AB
Urobilinogen, UA: 1 mg/dL (ref 0.0–1.0)

## 2011-02-09 LAB — URINE MICROSCOPIC-ADD ON

## 2011-02-09 LAB — LIPID PANEL
Cholesterol: 131 mg/dL (ref 0–200)
HDL: 48 mg/dL (ref >39)
Total CHOL/HDL Ratio: 2.7 RATIO
Triglycerides: 118 mg/dL (ref <150)

## 2011-02-09 LAB — TSH: TSH: 1.577 u[IU]/mL (ref 0.350–4.500)

## 2011-02-09 LAB — HEMOGLOBIN A1C: Mean Plasma Glucose: 100 mg/dL (ref <117)

## 2011-02-09 MED ORDER — ONDANSETRON HCL 4 MG/2ML IJ SOLN
4.0000 mg | Freq: Four times a day (QID) | INTRAMUSCULAR | Status: DC | PRN
  Administered 2011-02-09: 4 mg via INTRAVENOUS

## 2011-02-09 MED ORDER — SODIUM CHLORIDE 0.9 % IV SOLN
250.0000 mL | INTRAVENOUS | Status: DC
  Administered 2011-02-09: 1000 mL via INTRAVENOUS

## 2011-02-09 MED ORDER — BUDESONIDE-FORMOTEROL FUMARATE 160-4.5 MCG/ACT IN AERO
2.0000 | INHALATION_SPRAY | Freq: Two times a day (BID) | RESPIRATORY_TRACT | Status: DC
  Administered 2011-02-09 – 2011-02-13 (×9): 2 via RESPIRATORY_TRACT
  Filled 2011-02-09: qty 6

## 2011-02-09 MED ORDER — DEXTROSE 5 % IV SOLN
1.0000 g | INTRAVENOUS | Status: DC
  Filled 2011-02-09: qty 10

## 2011-02-09 MED ORDER — ONDANSETRON HCL 4 MG/2ML IJ SOLN
INTRAMUSCULAR | Status: AC
  Administered 2011-02-09: 4 mg via INTRAVENOUS
  Filled 2011-02-09: qty 2

## 2011-02-09 MED ORDER — BOOST PLUS PO LIQD
237.0000 mL | Freq: Two times a day (BID) | ORAL | Status: DC
  Administered 2011-02-09 – 2011-02-13 (×6): 237 mL via ORAL
  Filled 2011-02-09 (×12): qty 237

## 2011-02-09 MED ORDER — POTASSIUM CHLORIDE CRYS ER 20 MEQ PO TBCR
40.0000 meq | EXTENDED_RELEASE_TABLET | Freq: Every day | ORAL | Status: DC
  Administered 2011-02-09 – 2011-02-12 (×4): 40 meq via ORAL
  Filled 2011-02-09 (×4): qty 2

## 2011-02-09 MED ORDER — LEVOFLOXACIN IN D5W 250 MG/50ML IV SOLN
250.0000 mg | INTRAVENOUS | Status: DC
  Administered 2011-02-09 – 2011-02-11 (×2): 250 mg via INTRAVENOUS
  Filled 2011-02-09 (×3): qty 50

## 2011-02-09 MED ORDER — NON FORMULARY: 237.0000 mL | Freq: Two times a day (BID) | Status: DC

## 2011-02-09 MED ORDER — MAGNESIUM OXIDE 400 MG PO TABS
400.0000 mg | ORAL_TABLET | Freq: Two times a day (BID) | ORAL | Status: DC
  Administered 2011-02-09 – 2011-02-10 (×3): 400 mg via ORAL
  Filled 2011-02-09 (×4): qty 1

## 2011-02-09 NOTE — Progress Notes (Signed)
Subjective: No events overnight. The patient was feeling better indicating improvement in abdominal pain. She denies fevers and chills, no urinary concerns at this time. Her abdominal pain is still in periumbilical area.  Objective:  Vital signs in last 24 hours:  Filed Vitals:   02/09/11 0700 02/09/11 0800 02/09/11 0808 02/09/11 0820  BP: 101/36 113/38    Pulse: 67 69    Temp:    98 F (36.7 C)  TempSrc:    Oral  Resp: 21 17    Height:      Weight:      SpO2: 96% 99% 100%     Intake/Output from previous day:   Intake/Output Summary (Last 24 hours) at 02/09/11 0854 Last data filed at 02/09/11 0700  Gross per 24 hour  Intake    795 ml  Output      4 ml  Net    791 ml    Physical Exam: General: Alert, awake, oriented x3, in no acute distress. HEENT: No bruits, no goiter. Dry mucous membranes Heart: Iregular rate and rhythm, without murmurs, rubs, gallops. No JVD Lungs: Clear to auscultation bilaterally. Decreased breath sounds at bases with minimal bibasilar crackles Abdomen: Soft, mild periumbilical tenderness, nondistended, positive bowel sounds. Extremities: No clubbing, cyanosis. +1 bilateral pitting edema with positive pedal pulses. Neuro: Grossly intact, nonfocal.   Lab Results:  Basic Metabolic Panel:    Component Value Date/Time   NA 136 02/09/2011 0500   K 3.0* 02/09/2011 0500   CL 91* 02/09/2011 0500   CO2 26 02/09/2011 0500   BUN 72* 02/09/2011 0500   CREATININE 5.37* 02/09/2011 0500   GLUCOSE 77 02/09/2011 0500   CALCIUM 7.0* 02/09/2011 0500   CBC:    Component Value Date/Time   WBC 10.7* 02/09/2011 0500   HGB 11.7* 02/09/2011 0500   HCT 34.7* 02/09/2011 0500   PLT 120* 02/09/2011 0500   MCV 84.2 02/09/2011 0500   NEUTROABS 7.9* 02/08/2011 0950   LYMPHSABS 0.7 02/08/2011 0950   MONOABS 0.6 02/08/2011 0950   EOSABS 0.0 02/08/2011 0950   BASOSABS 0.0 02/08/2011 0950    Recent Results (from the past 240 hour(s))  CLOSTRIDIUM DIFFICILE BY PCR      Status: Normal   Collection Time   02/08/11  1:15 PM      Component Value Range Status Comment   C difficile by pcr NEGATIVE  NEGATIVE  Final   MRSA PCR SCREENING     Status: Normal   Collection Time   02/08/11  8:52 PM      Component Value Range Status Comment   MRSA by PCR NEGATIVE  NEGATIVE  Final     Studies/Results: Ct Abdomen Pelvis Wo Contrast 02/08/2011   IMPRESSION: Sigmoid colon colonic diverticula with prominent muscular hypertrophy. The minimal haziness of fat planes surrounding the sigmoid colon are unchanged from the prior exam.  No definitive findings of acute diverticulitis.  Portion of the jejunum with apparent slight wall thickening which may be related to under distension rather than true abnormality (series 2 image 24).    If there are any progressive symptoms this can be reevaluated follow-up.  Prominent atherosclerotic type changes as noted above.    Ct Head Wo Contrast 02/08/2011    IMPRESSION:  1.  No acute intracranial abnormality. 2.  Stable mild to moderate cortical atrophy and moderate chronic microvascular ischemic changes of the white matter.    US Renal 02/08/2011  RENAL/URINARY TRACT ULTRASOUND Comparison:  CT scan 02/18/2011.Prior ultrasound 11/02/2010.  Findings:   Right Kidney:  9.2 cm in length.  Stable renal cortical thinning without focal mass or hydronephrosis.  Extensive arterial calcifications are noted.   Left Kidney:  9.9 cm in length.  Renal cortical thinning is stable. No hydronephrosis or renal mass.  Extensive renal artery calcifications.  A simple appearing 1.1 cm cyst is noted.   Bladder:  Decompressed but grossly normal.   Dg Chest Portable 1 View 02/08/2011     IMPRESSION: No acute cardiopulmonary process.    Medications: Scheduled Meds:   . sodium chloride   Intravenous Once  . budesonide-formoterol  2 puff Inhalation BID  . enoxaparin  30 mg Subcutaneous Q24H  . ezetimibe-simvastatin  1 tablet Oral QHS  . ferrous sulfate  325  mg Oral Q breakfast  . FLUoxetine  10 mg Oral Daily  . hydrALAZINE  10 mg Oral Q8H  . isosorbide mononitrate  30 mg Oral Daily  . latanoprost  1 drop Both Eyes QHS  . metolazone  5 mg Oral 4 times weekly  . metoprolol  25 mg Oral Daily  . ondansetron      . ondansetron (ZOFRAN) IV  4 mg Intravenous Once  . pantoprazole  40 mg Oral Q1200  . pneumococcal 23 valent vaccine  0.5 mL Intramuscular Tomorrow-1000  . potassium chloride  10 mEq Intravenous Once  . potassium chloride  40 mEq Oral Once  . sodium chloride  500 mL Intravenous Once  . sodium chloride  3 mL Intravenous Q12H  . tiotropium  18 mcg Inhalation Daily  . torsemide  20 mg Oral Daily  . DISCONTD: Mometasone Furo-Formoterol Fum  2 puff Inhalation BID   Continuous Infusions:   . sodium chloride     PRN Meds:.acetaminophen, acetaminophen, albuterol, albuterol, HYDROcodone-acetaminophen, mirtazapine, nitroGLYCERIN, ondansetron, senna-docusate, sodium chloride  Assessment/Plan:  Principal Problem:  *Abdominal pain - clinically improving with current medical measures. Patient is tolerating current diet. Mesenteric duplex still pending at this time. This could be related to mesenteric ischemia however at this time lactic acid is within normal limits and CT of the abdomen and pelvis was not suggestive of any acute findings. TSH is within normal limits. Renal ultrasound was significant for extensive calcifications but no acute findings. We will proceed with ordering ANA and evaluating for potential vasculitis disorder.  Active Problems:  Chronic systolic/diastolicc heart failure - previous records, 2-D echo September 2011 is indicative of ejection fraction of 60-65%, findings consistent with grade 2 diastolic dysfunction. Patient has chronic symptoms of 3 pillow orthopnea however, presented to [facility] more hypovolemic rather than volume overloaded. We will continue to hold torsemide and continue IV fluids with gentle hydration  while we're monitoring vital signs ensuring to keep oxygen saturations above 90%. 2-D echo is pending at this time. Will continue to follow daily weights.   CKD (chronic kidney disease) stage 4, GFR 15-29 ml/min - worsening creatinine, unclear etiology at this time. I have discussed this case with nephrology specialist Dr. Eliott Nine and the recommendation was to proceed with further workup obtaining spot urine sodium/creatinine, SPEP, UPEP, ANA. Will continue with gentle hydration, monitor urine input and output. We'll continue to hold off standing agents colchicine, torsemide for now.   UTI (lower urinary tract infection) - Will initiate treatment with Rocephin 1 g once daily empirically until urine culture results are back.   HYPERTENSION - currently remains stable. We'll continue the same medication regimen for now.   Atrial fibrillation - rate controlled on metoprolol. We will  continue metoprolol at current rate. Patient is not on anticoagulation secondary to history of GI bleed. Please note she has IVC filter placed   PEPTIC ULCER DISEASE - continue Protonix   Hypokalemia - along with low magnesium levels. We will continue to replete and check electrolyte panel in the AM.   Hypomagnesemia - replete   HYPERLIPIDEMIA - fasting lipid panel pending   GOUT, ACUTE - hold colchicine   DEPRESSION - continue home medication   Thrombocytopenia - remains stable and if patient's baseline    LOS: 1 day   MAGICK-Shavonn Convey 02/09/2011, 8:54 AM

## 2011-02-09 NOTE — Progress Notes (Signed)
PT/OT/SLP Cancellation Note  Treatment cancelled today due to medical issues with patient which prohibited therapy Potassium level of 3.0.  Will check on pt again tomorrow.  Donna Reese [phone]

## 2011-02-09 NOTE — Progress Notes (Signed)
INITIAL ADULT NUTRITION ASSESSMENT Date: 02/09/2011   Time: 11:06 AM Reason for Assessment: consult  ASSESSMENT: Female 77 y.o.  Dx: Abdominal pain  Hx:  Past Medical History  Diagnosis Date  . Diabetes mellitus   . Diverticulosis   . Osteopenia   . PUD (peptic ulcer disease)   . Peripheral arterial disease   . Hypertension   . Hyperlipidemia   . Diastolic heart failure     a. echo 9/11: EF 60-65%, mild LVH, grade 2 diast dysfxn, AV mean gradient 9 mmHg, mild MR, RVE, RVSP 59  . Coronary artery disease     a. s/p CABG;  b. cath 8/09: LM occluded; RCA 50%; L-LAD ok, Radial-OM ok; bilat renal art stenosis  . Renovascular hypertension   . Renal artery stenosis     a. s/p L RA stent 8/09  . Paroxysmal a-fib     no coumadin 2/2 GI Blee  . DVT (deep venous thrombosis)   . Pulmonary embolus     s/p IVC filter  . History of gastrointestinal bleeding     no ASA or coumadin  . Contrast dye induced nephropathy   . Orthostatic hypotension   . CHF (congestive heart failure)   . COPD (chronic obstructive pulmonary disease)      Scheduled Meds:   . sodium chloride   Intravenous Once  . budesonide-formoterol  2 puff Inhalation BID  . enoxaparin  30 mg Subcutaneous Q24H  . ezetimibe-simvastatin  1 tablet Oral QHS  . ferrous sulfate  325 mg Oral Q breakfast  . FLUoxetine  10 mg Oral Daily  . hydrALAZINE  10 mg Oral Q8H  . isosorbide mononitrate  30 mg Oral Daily  . latanoprost  1 drop Both Eyes QHS  . levofloxacin (LEVAQUIN) IV  250 mg Intravenous Q48H  . magnesium oxide  400 mg Oral BID  . metolazone  5 mg Oral 4 times weekly  . metoprolol  25 mg Oral Daily  . ondansetron      . pantoprazole  40 mg Oral Q1200  . pneumococcal 23 valent vaccine  0.5 mL Intramuscular Tomorrow-1000  . potassium chloride  10 mEq Intravenous Once  . potassium chloride  40 mEq Oral Once  . potassium chloride  40 mEq Oral Daily  . sodium chloride  500 mL Intravenous Once  . sodium chloride  3 mL  Intravenous Q12H  . tiotropium  18 mcg Inhalation Daily  . DISCONTD: cefTRIAXone (ROCEPHIN) IV  1 g Intravenous Q24H  . DISCONTD: Mometasone Furo-Formoterol Fum  2 puff Inhalation BID  . DISCONTD: ondansetron      . DISCONTD: torsemide  20 mg Oral Daily   Continuous Infusions:   . sodium chloride 1,000 mL (02/09/11 1029)  . DISCONTD: sodium chloride     PRN Meds:.acetaminophen, acetaminophen, albuterol, albuterol, HYDROcodone-acetaminophen, mirtazapine, nitroGLYCERIN, ondansetron (ZOFRAN) IV, senna-docusate, sodium chloride, DISCONTD: ondansetron    Ht: 5\' 5"  (165.1 cm)  Wt: 125 lb 3.5 oz (56.8 kg)  Ideal Wt: 68.1 kg % Ideal Wt: 83%  Usual Wt: 230 lbs % Usual Wt: 54%  Body mass index is 20.84 kg/(m^2).  Food/Nutrition Related Hx:   Pt admitted with abdominal pain which she states has been present for 4 months.  Pt reports several episodes of vomiting daily, usually associated with meals/solid foods.  Denies feeling as though foods get stuck or difficulty swallowing. Per RN, pt consumed approximate 10% of meal this am.  At time of visit, pt had consumed 4 oz of  orange juice and was very nauseous.  Pt states she can't drink coffee any more because it irritates her stomach.  Per pt, sometimes liquids are tolerated best, however, she has really not been eating or drinking at all except with meds if needed.  Pt reports 105 lbs of wt loss in the past 4 months, a 45% wt loss.  Pt does show some physical signs of wt loss- sagging skin, wasting at clavicles.  Pt has a h/o peptic ulcer disease.  Pt qualifies for severe malnutrition in setting of chronic illness. Pt with 4 BMs yesterday, none so far today.  No episode of emesis since admission, however, pt currently very nauseous.  Length of discussion limited by pt's nausea.  Labs:  CMP     Component Value Date/Time   NA 136 02/09/2011 0500   K 3.0* 02/09/2011 0500   CL 91* 02/09/2011 0500   CO2 26 02/09/2011 0500   GLUCOSE 77  02/09/2011 0500   BUN 72* 02/09/2011 0500   CREATININE 5.37* 02/09/2011 0500   CALCIUM 7.0* 02/09/2011 0500   PROT 6.7 02/08/2011 0950   ALBUMIN 3.7 02/08/2011 0950   AST 55* 02/08/2011 0950   ALT 25 02/08/2011 0950   ALKPHOS 135* 02/08/2011 0950   BILITOT 0.8 02/08/2011 0950   GFRNONAA 7* 02/09/2011 0500   GFRAA 8* 02/09/2011 0500    CBC    Component Value Date/Time   WBC 10.7* 02/09/2011 0500   RBC 4.12 02/09/2011 0500   HGB 11.7* 02/09/2011 0500   HCT 34.7* 02/09/2011 0500   PLT 120* 02/09/2011 0500   MCV 84.2 02/09/2011 0500   MCH 28.4 02/09/2011 0500   MCHC 33.7 02/09/2011 0500   RDW 15.8* 02/09/2011 0500   LYMPHSABS 0.7 02/08/2011 0950   MONOABS 0.6 02/08/2011 0950   EOSABS 0.0 02/08/2011 0950   BASOSABS 0.0 02/08/2011 0950      Intake: 10-25% of meals Output: 4 BMs yesterday, none so far today  Diet Order: Sodium Restricted  Supplements/Tube Feeding: none  IVF:    sodium chloride Last Rate: 1,000 mL (02/09/11 1029)  DISCONTD: sodium chloride     Estimated Nutritional Needs:   Kcal: 1565-1700 Protein:68-81 g/day Fluid: >1.5 mL/day  NUTRITION DIAGNOSIS: -Inadequate protein energy intake (NI-5.3).  Status: Ongoing  RELATED TO: abdominal pain, vomiting  AS EVIDENCE BY: pt with self-reported 105 lbs of wt loss, several episodes of emesis daily leading to restriction in intake.  MONITORING/EVALUATION(Goals): 1.  Food/Beverage; improvement in intake to >25% of smalls.  Diet changed to [facility]. 2.  Gastrointestinal; resolve of diarrhea/excessive stool output.  Resolve of persistent N/V 3.  Wt/wt change; deter further loss.  EDUCATION NEEDS: -Education needs addressed  INTERVENTION: 1.  Modify diet; recommend change diet to BLAND to decrease consumption of acidic foods and potential irritants.  2.  Supplements; pt states she has been drinking some Boost at home, chocolate.  Encouraged pt to drink vanilla flavored to decrease possible gastric irritation.  Will  order for pt to try.  Dietitian #: [phone]  DOCUMENTATION CODES Per approved criteria  -Severe malnutrition in the context of chronic illness    Loyce Dys [facility] 02/09/2011, 11:06 AM

## 2011-02-09 NOTE — Progress Notes (Signed)
*  PRELIMINARY RESULTS* Echocardiogram 2D Echocardiogram has been performed.  Clide Deutscher RDCS 02/09/2011, 12:39 PM

## 2011-02-10 LAB — BASIC METABOLIC PANEL
BUN: 76 mg/dL — ABNORMAL HIGH (ref 6–23)
CO2: 23 mEq/L (ref 19–32)
Calcium: 7 mg/dL — ABNORMAL LOW (ref 8.4–10.5)
Creatinine, Ser: 4.93 mg/dL — ABNORMAL HIGH (ref 0.50–1.10)
GFR calc non Af Amer: 8 mL/min — ABNORMAL LOW (ref >90)
Glucose, Bld: 90 mg/dL (ref 70–99)
Sodium: 137 mEq/L (ref 135–145)

## 2011-02-10 LAB — CBC
MCH: 29.4 pg (ref 26.0–34.0)
MCHC: 35.1 g/dL (ref 30.0–36.0)
MCV: 83.6 fL (ref 78.0–100.0)
Platelets: 95 10*3/uL — ABNORMAL LOW (ref 150–400)
RDW: 16.1 % — ABNORMAL HIGH (ref 11.5–15.5)

## 2011-02-10 LAB — MAGNESIUM: Magnesium: 1.2 mg/dL — ABNORMAL LOW (ref 1.5–2.5)

## 2011-02-10 MED ORDER — POTASSIUM CHLORIDE CRYS ER 20 MEQ PO TBCR
40.0000 meq | EXTENDED_RELEASE_TABLET | Freq: Once | ORAL | Status: AC
  Administered 2011-02-10: 40 meq via ORAL

## 2011-02-10 MED ORDER — MAGNESIUM OXIDE 400 MG PO TABS
400.0000 mg | ORAL_TABLET | Freq: Three times a day (TID) | ORAL | Status: DC
  Administered 2011-02-10 – 2011-02-13 (×9): 400 mg via ORAL
  Filled 2011-02-10 (×12): qty 1

## 2011-02-10 MED ORDER — SODIUM CHLORIDE 0.9 % IV SOLN: INTRAVENOUS | Status: DC

## 2011-02-10 NOTE — Progress Notes (Signed)
Subjective: No events overnight. Pt reports feeling better and tells me she has had significant improvement with abdominal pain. Her BM is soft at this point and she reports tolerating current diet.   Objective:  Vital signs in last 24 hours:  Filed Vitals:   02/09/11 2229 02/10/11 0509 02/10/11 0815 02/10/11 1007  BP: 114/62 120/49  130/61  Pulse: 78 72  76  Temp: 98 F (36.7 C) 97.6 F (36.4 C)    TempSrc: Oral Oral    Resp: 18 18    Height:      Weight:  64.456 kg (142 lb 1.6 oz)    SpO2: 95% 95% 93%     Intake/Output from previous day:   Intake/Output Summary (Last 24 hours) at 02/10/11 1109 Last data filed at 02/10/11 1010  Gross per 24 hour  Intake   1097 ml  Output    308 ml  Net    789 ml    Physical Exam: General: Alert, awake, oriented x3, in no acute distress. HEENT: No bruits, no goiter. Heart: Iregular rate and rhythm, without murmurs, rubs, gallops. Lungs: Clear to auscultation bilaterally. Abdomen: Soft, nontender, nondistended, positive bowel sounds. Extremities: No clubbing cyanosis or edema with positive pedal pulses. Neuro: Grossly intact, nonfocal.   Lab Results:  Basic Metabolic Panel:    Component Value Date/Time   NA 137 02/10/2011 0631   K 3.0* 02/10/2011 0631   CL 96 02/10/2011 0631   CO2 23 02/10/2011 0631   BUN 76* 02/10/2011 0631   CREATININE 4.93* 02/10/2011 0631   GLUCOSE 90 02/10/2011 0631   CALCIUM 7.0* 02/10/2011 0631   CBC:    Component Value Date/Time   WBC 7.4 02/10/2011 0631   HGB 11.1* 02/10/2011 0631   HCT 31.6* 02/10/2011 0631   PLT 95* 02/10/2011 0631   MCV 83.6 02/10/2011 0631   NEUTROABS 7.9* 02/08/2011 0950   LYMPHSABS 0.7 02/08/2011 0950   MONOABS 0.6 02/08/2011 0950   EOSABS 0.0 02/08/2011 0950   BASOSABS 0.0 02/08/2011 0950    Recent Results (from the past 240 hour(s))  CLOSTRIDIUM DIFFICILE BY PCR     Status: Normal   Collection Time   02/08/11  1:15 PM      Component Value Range Status Comment   C  difficile by pcr NEGATIVE  NEGATIVE  Final   MRSA PCR SCREENING     Status: Normal   Collection Time   02/08/11  8:52 PM      Component Value Range Status Comment   MRSA by PCR NEGATIVE  NEGATIVE  Final     Studies/Results: Ct Abdomen Pelvis Wo Contrast  02/08/2011  *RADIOLOGY REPORT*  Clinical Data: Nausea.  Weakness.  Diabetic.  High blood pressure. History of pulmonary embolus and deep venous thrombosis.  Prior appendectomy, cholecystectomy, hysterectomy and abdominal aortic aneurysm repair.  CT ABDOMEN AND PELVIS WITHOUT CONTRAST  Technique:  Multidetector CT imaging of the abdomen and pelvis was performed following the standard protocol without intravenous contrast.  Comparison: 11/28/2010.  Findings: Prominent coronary artery calcifications.  Cardiomegaly.  Significant atherosclerotic type changes in calcification all visualized vascular structures.  Post abdominal bifemoral bypass without complication noted.  Inferior vena cava filter is in place.  Noncontrast imaging reveals a small calcification within the liver otherwise no focal hepatic lesion.  Tiny calcification within the spleen otherwise no lesion noted.  Diffuse fatty replacement of the pancreas without surrounding inflammation.  No adrenal lesion.  No hydronephrosis.  Left renal sub centimeter low density  structure probably a cyst although incompletely assessed without change.  Post hysterectomy and cholecystectomy.  Small hiatal hernia.  Sigmoid colon colonic diverticula with prominent muscular hypertrophy. The minimal haziness of fat planes surrounding the sigmoid colon are unchanged from the prior exam.  No definitive findings of diverticulitis.  No free fluid or free intraperitoneal air.  Portion of the jejunum with apparent slight wall thickening which may be related to under distension rather than true abnormality (series 2 image 24).    If there are any progressive symptoms this can be reevaluated follow-up.  Hip joint degenerative  changes greater left.  The bones are osteopenic. Chronic compression fracture of the L1 vertebral body with mild kyphosis and retropulsion unchanged from prior exam.  IMPRESSION: Sigmoid colon colonic diverticula with prominent muscular hypertrophy. The minimal haziness of fat planes surrounding the sigmoid colon are unchanged from the prior exam.  No definitive findings of acute diverticulitis.  Portion of the jejunum with apparent slight wall thickening which may be related to under distension rather than true abnormality (series 2 image 24).    If there are any progressive symptoms this can be reevaluated follow-up.  Prominent atherosclerotic type changes as noted above.  Original Report Authenticated By: Fuller Canada, M.D.   Ct Head Wo Contrast 02/08/2011    IMPRESSION:  1.  No acute intracranial abnormality. 2.  Stable mild to moderate cortical atrophy and moderate chronic microvascular ischemic changes of the white matter.    US Renal 02/08/2011   IMPRESSION: Renal cortical thinning and extensive renal artery calcifications but no hydronephrosis.   Dg Chest Portable 1 View 02/08/2011   IMPRESSION: No acute cardiopulmonary process.   Medications: Scheduled Meds:   . Boost Plus  237 mL Oral BID WC  . budesonide-formoterol  2 puff Inhalation BID  . enoxaparin  30 mg Subcutaneous Q24H  . ezetimibe-simvastatin  1 tablet Oral QHS  . ferrous sulfate  325 mg Oral Q breakfast  . FLUoxetine  10 mg Oral Daily  . hydrALAZINE  10 mg Oral Q8H  . isosorbide mononitrate  30 mg Oral Daily  . latanoprost  1 drop Both Eyes QHS  . levofloxacin (LEVAQUIN) IV  250 mg Intravenous Q48H  . magnesium oxide  400 mg Oral BID  . metolazone  5 mg Oral 4 times weekly  . metoprolol  25 mg Oral Daily  . pantoprazole  40 mg Oral Q1200  . pneumococcal 23 valent vaccine  0.5 mL Intramuscular Tomorrow-1000  . potassium chloride  40 mEq Oral Daily  . sodium chloride  3 mL Intravenous Q12H  . tiotropium  18 mcg  Inhalation Daily  . DISCONTD: NON FORMULARY 237 mL  237 mL Oral BID   Continuous Infusions:   . DISCONTD: sodium chloride 1,000 mL (02/09/11 1029)   PRN Meds:.acetaminophen, acetaminophen, albuterol, albuterol, HYDROcodone-acetaminophen, mirtazapine, nitroGLYCERIN, ondansetron (ZOFRAN) IV, senna-docusate, sodium chloride  Assessment/Plan:  Principal Problem:  *Abdominal pain - clinically improving with current medical measures. Patient is tolerating current diet. Mesenteric duplex still pending at this time. This could be related to mesenteric ischemia however at this time lactic acid is within normal limits and CT of the abdomen and pelvis was not suggestive of any acute findings. TSH is within normal limits. Renal ultrasound was significant for extensive calcifications but no acute findings. ANA still pending.  Active Problems:  Chronic systolic/diastolicc heart failure - per previous records, 2-D echo September 2011 is indicative of ejection fraction of 60-65%, findings consistent with grade 2  diastolic dysfunction. Patient has chronic symptoms of 3 pillow orthopnea however, presented to [facility] more hypovolemic rather than volume overloaded. We will continue to hold torsemide and continue IV fluids with gentle hydration while we're monitoring vital signs ensuring to keep oxygen saturations above 90%. 2-D echo is pending at this time. Will continue to follow daily weights.   CKD (chronic kidney disease) stage 4, GFR 15-29 ml/min - creatinine improving and now trending down, 5.37--> 4.93, unclear etiology at this time. I have discussed this case with nephrology specialist Dr. Eliott Nine and we following recommendations. UPEP/SPEP/ANA still pending. Will continue with gentle hydration, monitor urine input and output. We'll continue to hold off nephrotoxic agents colchicine, torsemide for now.   UTI (lower urinary tract infection) - Will initiate treatment with Levaquin and renally  dosed.  HYPERTENSION - currently remains stable. We'll continue the same medication regimen for now.   Atrial fibrillation - rate controlled on metoprolol. We will continue metoprolol at current rate. Patient is not on anticoagulation secondary to history of GI bleed. Please note she has IVC filter placed   PEPTIC ULCER DISEASE - continue Protonix   Hypokalemia - along with low magnesium levels. We will continue to replete and check electrolyte panel in the AM.   Hypomagnesemia - replete   HYPERLIPIDEMIA - LDL at goal, will continue the same medication regimen  GOUT, ACUTE - hold colchicine   DEPRESSION - continue home medication   Thrombocytopenia - remains stable and if patient's baseline    LOS: 2 days   MAGICK-Savana Spina 02/10/2011, 11:09 AM

## 2011-02-10 NOTE — Progress Notes (Signed)
Physical Therapy Evaluation Patient Details Name: Donna Reese MRN: [id number] DOB: 06-09-1931 Today's Date: 02/10/2011  Problem List:  Patient Active Problem List  Diagnoses  . HYPERLIPIDEMIA  . GOUT, ACUTE  . DEPRESSION  . MIGRAINE WITHOUT AURA  . POLYNEUROPATHY IN DIABETES  . HYPERTENSION  . Coronary atherosclerosis of native coronary artery  . Atrial fibrillation  . Chronic systolic heart failure  . RENAL ARTERY STENOSIS  . PERIPHERAL VASCULAR DISEASE  . COPD  . PEPTIC ULCER DISEASE  . CKD (chronic kidney disease) stage 4, GFR 15-29 ml/min  . DEGENERATIVE DISC DISEASE  . OSTEOPENIA  . Personal History of Venous Thrombosis and Embolism  . SOB (shortness of breath)  . Peripheral edema  . Chronic diastolic heart failure  . Other malaise and fatigue  . Anemia of chronic disease  . B12 deficiency  . Abdominal pain  . Hypokalemia  . Thrombocytopenia  . Hypomagnesemia  . UTI (lower urinary tract infection)    Past Medical History:  Past Medical History  Diagnosis Date  . Diabetes mellitus   . Diverticulosis   . Osteopenia   . PUD (peptic ulcer disease)   . Peripheral arterial disease   . Hypertension   . Hyperlipidemia   . Diastolic heart failure     a. echo 9/11: EF 60-65%, mild LVH, grade 2 diast dysfxn, AV mean gradient 9 mmHg, mild MR, RVE, RVSP 59  . Coronary artery disease     a. s/p CABG;  b. cath 8/09: LM occluded; RCA 50%; L-LAD ok, Radial-OM ok; bilat renal art stenosis  . Renovascular hypertension   . Renal artery stenosis     a. s/p L RA stent 8/09  . Paroxysmal a-fib     no coumadin 2/2 GI Blee  . DVT (deep venous thrombosis)   . Pulmonary embolus     s/p IVC filter  . History of gastrointestinal bleeding     no ASA or coumadin  . Contrast dye induced nephropathy   . Orthostatic hypotension   . CHF (congestive heart failure)   . COPD (chronic obstructive pulmonary disease)    Past Surgical History:  Past Surgical History  Procedure  Date  . Appendectomy   . Cholecystectomy   . Abdominal hysterectomy   . Coronary artery bypass graft 09-04-2001    carotid u/s unchnaged 40-50% ICA stenosis 12/1998  . Cataract extraction, bilateral 2001  . Abdominal aortic aneurysm repair 1997  . Nm myoview ltd     neg EF 75%--06/26/2004  . Carotid doppler     12/05 unchanged 9-06  . Cardiac catheterization 09-03-01  . Esophagogastroduodenoscopy     abnormal 2007, esophageal stricture 10-12-03-07 gastritis and major bleef  . Colonoscopy     abnormal 03-13-06  . Vena cava filter placement 07-2007    Dr. Excell Seltzer  . Renal artery doppler 11-17-2008    severe iliac stenosis    PT Assessment/Plan/Recommendation PT Assessment Clinical Impression Statement: 77 y.o. female admitted to [facility] with weakness, renal failure, hypokalemia who presents with decreased strength, decreased activity tolerance, decreased endurance, decreased balance.  The patient would benefit from skilled PT to maximize functional mobility, independence and safety so that patient may be able to return home with husband's supervision safely at discharge.   PT Recommendation/Assessment: Patient will need skilled PT in the acute care venue PT Problem List: Decreased strength;Decreased activity tolerance;Decreased balance;Decreased mobility;Decreased knowledge of use of DME;Cardiopulmonary status limiting activity Problem List Comments: increased DOE with mobility PT Therapy Diagnosis :  Difficulty walking;Abnormality of gait;Generalized weakness PT Plan PT Frequency: Min 3X/week PT Treatment/Interventions: DME instruction;Gait training;Stair training;Functional mobility training;Therapeutic exercise;Balance training;Neuromuscular re-education;Patient/family education PT Recommendation Follow Up Recommendations: None Equipment Recommended: None recommended by PT PT Goals  Acute Rehab PT Goals PT Goal Formulation: With patient Time For Goal Achievement: 2 weeks Pt will go  Supine/Side to Sit: with modified independence;with HOB 0 degrees PT Goal: Supine/Side to Sit - Progress: Other (comment) (not tested today) Pt will go Sit to Supine/Side: with modified independence;with HOB 0 degrees PT Goal: Sit to Supine/Side - Progress: Progressing toward goal Pt will Transfer Sit to Stand/Stand to Sit: with modified independence PT Transfer Goal: Sit to Stand/Stand to Sit - Progress: Progressing toward goal Pt will Transfer Bed to Chair/Chair to Bed: with modified independence PT Transfer Goal: Bed to Chair/Chair to Bed - Progress: Other (comment) (not tested today) Pt will Ambulate: >150 feet;with modified independence;with rolling walker PT Goal: Ambulate - Progress: Progressing toward goal Pt will Go Up / Down Stairs: 1-2 stairs;with modified independence;with rail(s) PT Goal: Up/Down Stairs - Progress: Other (comment) (not tested today) Pt will Perform Home Exercise Program: with supervision, verbal cues required/provided PT Goal: Perform Home Exercise Program - Progress: Other (comment) (not tested today)  PT Evaluation Precautions/Restrictions  Precautions Precautions: Fall Prior Functioning  Home Living Lives With: Spouse Receives Help From: Family Type of Home: House Home Layout: One level Home Access: Stairs to enter Entrance Stairs-Rails: Right Entrance Stairs-Number of Steps: 2 Home Adaptive Equipment: Walker - rolling Prior Function Level of Independence: Independent with basic ADLs;Independent with transfers;Requires assistive device for independence Able to Take Stairs?: Yes Driving: Yes Cognition Cognition Arousal/Alertness: Awake/alert Overall Cognitive Status: Appears within functional limits for tasks assessed Sensation/Coordination   Extremity Assessment RLE Assessment RLE Assessment: Exceptions to [facility] RLE Strength RLE Overall Strength: Deficits RLE Overall Strength Comments: grossly 3/5 per functional assessment  LLE  Assessment LLE Assessment: Exceptions to [facility] LLE Strength LLE Overall Strength: Deficits LLE Overall Strength Comments: grossly 3/5 per functional assessment.   Mobility (including Balance) Bed Mobility Bed Mobility: Yes Sit to Supine - Right: 6: Modified independent (Device/Increase time);With rail;HOB flat Transfers Transfers: Yes Sit to Stand: 4: Min assist;With upper extremity assist;With armrests;From chair/3-in-1 Sit to Stand Details (indicate cue type and reason): min assist to balance trunk during transitions.  Verbal cues to push up from support surface.   Stand to Sit: 5: Supervision;With upper extremity assist;With armrests;To bed;To chair/3-in-1 Stand to Sit Details: supervision for safety.  Patient relying heavily on arms to control descent to sit.   Ambulation/Gait Ambulation/Gait: Yes Ambulation/Gait Assistance: 4: Min assist Ambulation/Gait Assistance Details (indicate cue type and reason): min assist for safety secondary to patient reporting that her legs are feeling weak.   Ambulation Distance (Feet): 180 Feet Assistive device: Rolling walker Gait Pattern: Shuffle  Balance Balance Assessed: Yes Dynamic Standing Balance Dynamic Standing - Balance Support: No upper extremity supported;During functional activity (while wiping after BM on 3-in-1, LOB x2 min to recover.  ) Dynamic Standing - Level of Assistance: 4: Min assist Exercise    End of Session PT - End of Session Equipment Utilized During Treatment: Other (comment) (RW, O2 2 L [place]) Activity Tolerance: Patient limited by fatigue;Other (comment) (limited by fatigue and dyspnea) Patient left: in bed;with call bell in reach Nurse Communication: Other (comment) (informed RN tech of BM) General Behavior During Session: [facility] for tasks performed Cognition: [facility] for tasks performed  Destiny Hagin B. Zahriyah Joo, PT, DPT [phone] 02/10/2011,  3:24 PM

## 2011-02-10 NOTE — Plan of Care (Signed)
Problem: Phase II Progression Outcomes Goal: Discharge plan established Outcome: Progressing No f/u recommended by PT

## 2011-02-11 ENCOUNTER — Telehealth: Payer: Self-pay | Admitting: Oncology

## 2011-02-11 LAB — BASIC METABOLIC PANEL
BUN: 67 mg/dL — ABNORMAL HIGH (ref 6–23)
Creatinine, Ser: 3.1 mg/dL — ABNORMAL HIGH (ref 0.50–1.10)
GFR calc Af Amer: 16 mL/min — ABNORMAL LOW (ref >90)
GFR calc non Af Amer: 13 mL/min — ABNORMAL LOW (ref >90)
Glucose, Bld: 91 mg/dL (ref 70–99)

## 2011-02-11 LAB — CBC
HCT: 30.4 % — ABNORMAL LOW (ref 36.0–46.0)
Hemoglobin: 10.3 g/dL — ABNORMAL LOW (ref 12.0–15.0)
MCHC: 33.9 g/dL (ref 30.0–36.0)
MCV: 84 fL (ref 78.0–100.0)
RDW: 16.2 % — ABNORMAL HIGH (ref 11.5–15.5)

## 2011-02-11 LAB — ANA: Anti Nuclear Antibody(ANA): NEGATIVE

## 2011-02-11 NOTE — Telephone Encounter (Signed)
Talked to pt's daughter Debi informed her of MD visit is cancelled , but lab is still scheduled. She wants to wait until pt gets out of hospital to r/s appt.

## 2011-02-11 NOTE — Progress Notes (Signed)
Physical Therapy Treatment Patient Details Name: Donna Reese MRN: [id number] DOB: 04/17/1931 Today's Date: 02/11/2011  PT Assessment/Plan  PT - Assessment/Plan PT Plan: Discharge plan remains appropriate PT Frequency: Min 3X/week Follow Up Recommendations: None Equipment Recommended: None recommended by PT PT Goals  Acute Rehab PT Goals PT Goal: Supine/Side to Sit - Progress: Progressing toward goal PT Goal: Sit to Supine/Side - Progress: Progressing toward goal PT Transfer Goal: Sit to Stand/Stand to Sit - Progress: Progressing toward goal PT Transfer Goal: Bed to Chair/Chair to Bed - Progress: Progressing toward goal PT Goal: Ambulate - Progress: Progressing toward goal PT Goal: Up/Down Stairs - Progress: Progressing toward goal PT Goal: Perform Home Exercise Program - Progress: Progressing toward goal  PT Treatment Precautions/Restrictions  Precautions Precautions: Fall Mobility (including Balance) Bed Mobility Bed Mobility: No Transfers Transfers: Yes Sit to Stand: 4: Min assist;With upper extremity assist;With armrests;From chair/3-in-1 Sit to Stand Details (indicate cue type and reason): pt with posterior lean requring MinA with initial standing Stand to Sit: 5: Supervision;With upper extremity assist;With armrests;To bed;To chair/3-in-1 Ambulation/Gait Ambulation/Gait: Yes Ambulation/Gait Assistance: 4: Min assist Ambulation/Gait Assistance Details (indicate cue type and reason): cues for deep breathing, upright posture, pacing (pt required 3 standing rest breaks) Ambulation Distance (Feet): 250 Feet Assistive device: Rolling walker Gait Pattern: Shuffle    Exercise    End of Session PT - End of Session Equipment Utilized During Treatment: Other (comment) (2L O2 via [place]) Activity Tolerance: Patient limited by fatigue;Other (comment) Patient left: in chair;with call bell in reach General Behavior During Session: [facility] for tasks performed Cognition: [facility] for  tasks performed  [facility], PT [phone] 02/11/2011, 11:30 AM

## 2011-02-11 NOTE — Progress Notes (Signed)
Subjective: Patient states that she's feeling much better today. She's been tolerating her diet without any difficulty. The patient denies any difficulty with breathing. She states she ambulated with physical therapy today to begin the hall without any difficulty. The patient intends to return home to her prior living situation with her husband. The patient states that she's had intermittent diarrhea for years. She states that her diarrhea is much improved since this hospitalization. Objective: Filed Vitals:   02/11/11 0646 02/11/11 0849 02/11/11 1000 02/11/11 1359  BP: 128/62  150/76 153/67  Pulse:   87 72  Temp:    98 F (36.7 C)  TempSrc:    Oral  Resp:   16 20  Height:      Weight:      SpO2:  94%  100%   Weight change: 2.451 kg (5 lb 6.5 oz)  Intake/Output Summary (Last 24 hours) at 02/11/11 1749 Last data filed at 02/11/11 1306  Gross per 24 hour  Intake    720 ml  Output    401 ml  Net    319 ml    General: Alert, awake, oriented x3, in no acute distress.  HEENT: [place]/AT PEERL, EOMI Neck: Trachea midline,  no masses, no thyromegal,y no JVD, no carotid bruit OROPHARYNX:  Moist, No exudate/ erythema/lesions.  Heart: Regular rate and rhythm, without murmurs, rubs, gallops, PMI non-displaced, no heaves or thrills on palpation.  Lungs: Clear to auscultation, no wheezing or rhonchi noted. No increased vocal fremitus resonant to percussion  Abdomen: Soft, nontender, nondistended, positive bowel sounds, no masses no hepatosplenomegaly noted..  Neuro: No focal neurological deficits noted cranial nerves II through XII grossly intact. DTRs 2+ bilaterally upper and lower extremities. Strength 5 out of 5 in bilateral upper and lower extremities. Musculoskeletal: No warm swelling or erythema around joints, no spinal tenderness noted. Psychiatric: Patient alert and oriented x3, good insight and cognition, good recent to remote recall. Lymph node survey: No cervical axillary or inguinal  lymphadenopathy noted.     Lab Results:  Basename 02/11/11 0502 02/10/11 0631 02/08/11 2147  NA 139 137 --  K 4.4 3.0* --  CL 103 96 --  CO2 24 23 --  GLUCOSE 91 90 --  BUN 67* 76* --  CREATININE 3.10* 4.93* --  CALCIUM 7.2* 7.0* --  MG 1.2* 1.2* --  PHOS -- 5.7* 5.9*   No results found for this basename: AST:2,ALT:2,ALKPHOS:2,BILITOT:2,PROT:2,ALBUMIN:2 in the last 72 hours No results found for this basename: LIPASE:2,AMYLASE:2 in the last 72 hours  Basename 02/11/11 0502 02/10/11 0631  WBC 4.3 7.4  NEUTROABS -- --  HGB 10.3* 11.1*  HCT 30.4* 31.6*  MCV 84.0 83.6  PLT 73* 95*    Basename 02/09/11 1548 02/09/11 0831 02/08/11 2357  CKTOTAL 305* 295* 367*  CKMB 4.0 3.7 4.3*  CKMBINDEX -- -- --  TROPONINI <0.30 <0.30 <0.30    Basename 02/09/11 0500  POCBNP 14073.0*   No results found for this basename: DDIMER:2 in the last 72 hours  Basename 02/08/11 2147  HGBA1C 5.1    Basename 02/09/11 1120  CHOL 131  HDL 48  LDLCALC 59  TRIG 118  CHOLHDL 2.7  LDLDIRECT --    Basename 02/08/11 2147  TSH 1.577  T4TOTAL --  T3FREE --  THYROIDAB --   No results found for this basename: VITAMINB12:2,FOLATE:2,FERRITIN:2,TIBC:2,IRON:2,RETICCTPCT:2 in the last 72 hours  Micro Results: Recent Results (from the past 240 hour(s))  CLOSTRIDIUM DIFFICILE BY PCR     Status: Normal  Collection Time   02/08/11  1:15 PM      Component Value Range Status Comment   C difficile by pcr NEGATIVE  NEGATIVE  Final   MRSA PCR SCREENING     Status: Normal   Collection Time   02/08/11  8:52 PM      Component Value Range Status Comment   MRSA by PCR NEGATIVE  NEGATIVE  Final     Studies/Results: Ct Abdomen Pelvis Wo Contrast  02/08/2011  *RADIOLOGY REPORT*  Clinical Data: Nausea.  Weakness.  Diabetic.  High blood pressure. History of pulmonary embolus and deep venous thrombosis.  Prior appendectomy, cholecystectomy, hysterectomy and abdominal aortic aneurysm repair.  CT ABDOMEN AND  PELVIS WITHOUT CONTRAST  Technique:  Multidetector CT imaging of the abdomen and pelvis was performed following the standard protocol without intravenous contrast.  Comparison: 11/28/2010.  Findings: Prominent coronary artery calcifications.  Cardiomegaly.  Significant atherosclerotic type changes in calcification all visualized vascular structures.  Post abdominal bifemoral bypass without complication noted.  Inferior vena cava filter is in place.  Noncontrast imaging reveals a small calcification within the liver otherwise no focal hepatic lesion.  Tiny calcification within the spleen otherwise no lesion noted.  Diffuse fatty replacement of the pancreas without surrounding inflammation.  No adrenal lesion.  No hydronephrosis.  Left renal sub centimeter low density structure probably a cyst although incompletely assessed without change.  Post hysterectomy and cholecystectomy.  Small hiatal hernia.  Sigmoid colon colonic diverticula with prominent muscular hypertrophy. The minimal haziness of fat planes surrounding the sigmoid colon are unchanged from the prior exam.  No definitive findings of diverticulitis.  No free fluid or free intraperitoneal air.  Portion of the jejunum with apparent slight wall thickening which may be related to under distension rather than true abnormality (series 2 image 24).    If there are any progressive symptoms this can be reevaluated follow-up.  Hip joint degenerative changes greater left.  The bones are osteopenic. Chronic compression fracture of the L1 vertebral body with mild kyphosis and retropulsion unchanged from prior exam.  IMPRESSION: Sigmoid colon colonic diverticula with prominent muscular hypertrophy. The minimal haziness of fat planes surrounding the sigmoid colon are unchanged from the prior exam.  No definitive findings of acute diverticulitis.  Portion of the jejunum with apparent slight wall thickening which may be related to under distension rather than true  abnormality (series 2 image 24).    If there are any progressive symptoms this can be reevaluated follow-up.  Prominent atherosclerotic type changes as noted above.  Original Report Authenticated By: Fuller Canada, M.D.   Ct Head Wo Contrast  02/08/2011  *RADIOLOGY REPORT*  Clinical Data: Headache.  Nausea.  Generalized weakness.  History of diabetes, hypertension, and coronary artery disease.  CT HEAD WITHOUT CONTRAST 02/08/2011:  Technique:  Contiguous axial images were obtained from the base of the skull through the vertex without contrast.  Comparison: Unenhanced cranial CT 01/29/2010 and 06/28/2007 [facility].  MRI brain 05/29/2008 [facility] [address].  Findings: Mild to moderate age appropriate cortical atrophy. Moderate changes of small vessel disease of the white matter diffusely, unchanged. Ventricular system normal in size and appearance for age.  No mass lesion.  No midline shift.  No acute hemorrhage or hematoma.  No extra-axial fluid collections.  No evidence of acute infarction.  No focal osseous abnormality involving the skull.  Mild hyperostosis frontalis interna. Visualized paranasal sinuses, mastoid air cells, and middle ear cavities well-aerated.  Extensive bilateral  carotid siphon and vertebral artery atherosclerosis. Note again made of dense calcification involving the choroid plexus of the lateral ventricles and in the foramina of [place].  IMPRESSION:  1.  No acute intracranial abnormality. 2.  Stable mild to moderate cortical atrophy and moderate chronic microvascular ischemic changes of the white matter.  Original Report Authenticated By: Arnell Sieving, M.D.   US Renal  02/08/2011  The *RADIOLOGY REPORT*  Clinical Data: Acute on chronic renal failure.  RENAL/URINARY TRACT ULTRASOUND COMPLETE  Comparison:  CT scan 02/18/2011.Prior ultrasound 11/02/2010.  Findings:  Right Kidney:  9.2 cm in length.  Stable renal cortical thinning without focal mass or  hydronephrosis.  Extensive arterial calcifications are noted.  Left Kidney:  9.9 cm in length.  Renal cortical thinning is stable. No hydronephrosis or renal mass.  Extensive renal artery calcifications.  A simple appearing 1.1 cm cyst is noted.  Bladder:  Decompressed but grossly normal.  IMPRESSION: Renal cortical thinning and extensive renal artery calcifications but no hydronephrosis.  Original Report Authenticated By: P. Loralie Champagne, M.D.   Dg Chest Portable 1 View  02/08/2011  *RADIOLOGY REPORT*  Clinical Data: Chest pain, vomiting, short of breath  PORTABLE CHEST - 1 VIEW  Comparison:  chest radiograph 11/28/2010  Findings: Sternotomy wires overlie stable mildly enlarged heart silhouette with ectatic aorta.  Chronic elevation of left hemi diaphragm is stable.  No effusion, infiltrate, or pneumothorax.  IMPRESSION: No acute cardiopulmonary process.  Original Report Authenticated By: Genevive Bi, M.D.    Medications: I have reviewed the patient's current medications. Scheduled Meds:   . Boost Plus  237 mL Oral BID WC  . budesonide-formoterol  2 puff Inhalation BID  . enoxaparin  30 mg Subcutaneous Q24H  . ezetimibe-simvastatin  1 tablet Oral QHS  . ferrous sulfate  325 mg Oral Q breakfast  . FLUoxetine  10 mg Oral Daily  . hydrALAZINE  10 mg Oral Q8H  . isosorbide mononitrate  30 mg Oral Daily  . latanoprost  1 drop Both Eyes QHS  . levofloxacin (LEVAQUIN) IV  250 mg Intravenous Q48H  . magnesium oxide  400 mg Oral TID  . metolazone  5 mg Oral 4 times weekly  . metoprolol  25 mg Oral Daily  . pantoprazole  40 mg Oral Q1200  . potassium chloride  40 mEq Oral Daily  . sodium chloride  3 mL Intravenous Q12H  . tiotropium  18 mcg Inhalation Daily   Continuous Infusions:   . DISCONTD: sodium chloride 75 mL/hr at 02/10/11 1215   PRN Meds:.acetaminophen, acetaminophen, albuterol, albuterol, HYDROcodone-acetaminophen, mirtazapine, nitroGLYCERIN, ondansetron (ZOFRAN) IV,  senna-docusate, sodium chloride Assessment/Plan: Patient Active Hospital Problem List: Abdominal pain (02/07/2011)   Assessment: The patient's abdominal pain has resolved and she is tolerating her diet without any difficulty. I do not believe that this patient is having ischemic changes. The patient states that this problem has been occurring for many years and is likely associated with irritable bowel syndrome.    Plan: We'll continue to observe the patient with diet.  UTI (lower urinary tract infection) (02/09/2011)   Assessment: The patient's urinalysis demonstrated a likely urinary tract infection with with positive nitrates. The patient was started empirically on Levaquin. However there no urine cultures sent for guidance. The patient appears to be improving clinically and I will repeat a urinalysis on tomorrow and have a reflex culture contact the patient does have any worsening of the urinalysis profile.      LOS: 3 days

## 2011-02-11 NOTE — Telephone Encounter (Signed)
Notes reviewed. Appreciate assistance with this patient known very well.

## 2011-02-11 NOTE — Progress Notes (Signed)
Spoke to pt and she uses [facility] for oxygen. States her portable tank is empty. Made [facility] aware of pt needing portable tank to go home.

## 2011-02-11 NOTE — Plan of Care (Signed)
Problem: Phase II Progression Outcomes Goal: Vital signs remain stable Outcome: Progressing progressing     

## 2011-02-12 DIAGNOSIS — N179 Acute kidney failure, unspecified: Secondary | ICD-10-CM | POA: Diagnosis present

## 2011-02-12 LAB — BASIC METABOLIC PANEL
BUN: 52 mg/dL — ABNORMAL HIGH (ref 6–23)
Chloride: 102 mEq/L (ref 96–112)
Creatinine, Ser: 1.63 mg/dL — ABNORMAL HIGH (ref 0.50–1.10)
GFR calc Af Amer: 34 mL/min — ABNORMAL LOW (ref >90)
GFR calc non Af Amer: 29 mL/min — ABNORMAL LOW (ref >90)
Glucose, Bld: 97 mg/dL (ref 70–99)
Potassium: 6.7 mEq/L (ref 3.5–5.1)

## 2011-02-12 LAB — PROTEIN ELECTROPHORESIS, SERUM
Albumin ELP: 56.6 % (ref 55.8–66.1)
Alpha-1-Globulin: 7.2 % — ABNORMAL HIGH (ref 2.9–4.9)
Alpha-2-Globulin: 12.6 % — ABNORMAL HIGH (ref 7.1–11.8)
Gamma Globulin: 13.3 % (ref 11.1–18.8)

## 2011-02-12 MED ORDER — BOOST PLUS PO LIQD: 237.0000 mL | Freq: Two times a day (BID) | ORAL | Status: DC

## 2011-02-12 MED ORDER — SODIUM CHLORIDE 0.9 % IV SOLN
1.0000 g | Freq: Once | INTRAVENOUS | Status: AC
  Administered 2011-02-12: 1 g via INTRAVENOUS
  Filled 2011-02-12: qty 10

## 2011-02-12 MED ORDER — LEVOFLOXACIN 250 MG PO TABS: 250.0000 mg | ORAL_TABLET | Freq: Every day | ORAL | Status: AC

## 2011-02-12 MED ORDER — SODIUM POLYSTYRENE SULFONATE 15 GM/60ML PO SUSP
30.0000 g | ORAL | Status: AC
  Administered 2011-02-12: 30 g via ORAL
  Filled 2011-02-12: qty 120

## 2011-02-12 NOTE — Progress Notes (Signed)
This is an addendum to the note from today. The patient's electrolytes revealed that she had an elevated potassium of 6.5. The patient was given Kayexalate and calcium carbonate. Repeat her potassium and proceed as planned according to the results. Otherwise the patient is clinically stable. There are no peak T waves on her telemetry tracings.

## 2011-02-12 NOTE — Progress Notes (Signed)
1600 notified by lab RE: k level 6.7 made Dr Ashley Royalty aware with orders carried out

## 2011-02-12 NOTE — Progress Notes (Signed)
Occupational Therapy Evaluation Patient Details Name: Donna Reese MRN: [id number] DOB: [birth year] Today's Date: 02/12/2011  Problem List:  Patient Active Problem List  Diagnoses  . HYPERLIPIDEMIA  . GOUT, ACUTE  . DEPRESSION  . MIGRAINE WITHOUT AURA  . POLYNEUROPATHY IN DIABETES  . HYPERTENSION  . Coronary atherosclerosis of native coronary artery  . Atrial fibrillation  . Chronic systolic heart failure  . RENAL ARTERY STENOSIS  . PERIPHERAL VASCULAR DISEASE  . COPD  . PEPTIC ULCER DISEASE  . CKD (chronic kidney disease) stage 4, GFR 15-29 ml/min  . DEGENERATIVE DISC DISEASE  . OSTEOPENIA  . Personal History of Venous Thrombosis and Embolism  . SOB (shortness of breath)  . Peripheral edema  . Chronic diastolic heart failure  . Other malaise and fatigue  . Anemia of chronic disease  . B12 deficiency  . Abdominal pain  . Hypokalemia  . Thrombocytopenia  . Hypomagnesemia  . UTI (lower urinary tract infection)    Past Medical History:  Past Medical History  Diagnosis Date  . Diabetes mellitus   . Diverticulosis   . Osteopenia   . PUD (peptic ulcer disease)   . Peripheral arterial disease   . Hypertension   . Hyperlipidemia   . Diastolic heart failure     a. echo 9/11: EF 60-65%, mild LVH, grade 2 diast dysfxn, AV mean gradient 9 mmHg, mild MR, RVE, RVSP 59  . Coronary artery disease     a. s/p CABG;  b. cath 8/09: LM occluded; RCA 50%; L-LAD ok, Radial-OM ok; bilat renal art stenosis  . Renovascular hypertension   . Renal artery stenosis     a. s/p L RA stent 8/09  . Paroxysmal a-fib     no coumadin 2/2 GI Blee  . DVT (deep venous thrombosis)   . Pulmonary embolus     s/p IVC filter  . History of gastrointestinal bleeding     no ASA or coumadin  . Contrast dye induced nephropathy   . Orthostatic hypotension   . CHF (congestive heart failure)   . COPD (chronic obstructive pulmonary disease)    Past Surgical History:  Past Surgical History  Procedure  Date  . Appendectomy   . Cholecystectomy   . Abdominal hysterectomy   . Coronary artery bypass graft 09-04-2001    carotid u/s unchnaged 40-50% ICA stenosis 12/1998  . Cataract extraction, bilateral 2001  . Abdominal aortic aneurysm repair 1997  . Nm myoview ltd     neg EF 76%--06/26/2004  . Carotid doppler     12/05 unchanged 9-06  . Cardiac catheterization 09-03-01  . Esophagogastroduodenoscopy     abnormal 2007, esophageal stricture 10-12-03-07 gastritis and major bleef  . Colonoscopy     abnormal 03-13-06  . Vena cava filter placement 07-2007    Dr. Excell Seltzer  . Renal artery doppler 11-17-2008    severe iliac stenosis    OT Assessment/Plan/Recommendation OT Assessment Clinical Impression Statement: Pt presents to OT with decreased I with ADL activity and will benefit from OT to increase I with ADL activity OT Recommendation Equipment Recommended: None recommended by OT OT Goals Acute Rehab OT Goals OT Goal Formulation: With patient Time For Goal Achievement: 7 days ADL Goals Pt Will Transfer to Toilet: with modified independence  OT Evaluation Precautions/Restrictions  Precautions Precautions: Fall Prior Functioning Home Living Bathroom Shower/Tub: Tub/shower unit;Walk-in shower Bathroom Toilet: Standard Home Adaptive Equipment: Grab bars in shower;Shower chair with back Prior Function Level of Independence: Needs assistance with homemaking;Independent  with basic ADLs ADL ADL Eating/Feeding: Independent;Simulated Where Assessed - Eating/Feeding: Chair Grooming: Performed;Supervision/safety Where Assessed - Grooming: Unsupported Upper Body Bathing: Simulated Where Assessed - Upper Body Bathing: Sitting at sink Lower Body Bathing: Set up Where Assessed - Lower Body Bathing: Sit to stand from chair Upper Body Dressing: Simulated;Set up Where Assessed - Upper Body Dressing: Sitting, chair Lower Body Dressing: Supervision/safety Toilet Transfer: +1 Total  assistance Toilet Transfer Equipment: Bedside commode Toileting - Clothing Manipulation: Performed;Minimal assistance Where Assessed - Toileting Clothing Manipulation: Sit on 3-in-1 or toilet Toileting - Hygiene: Minimal assistance Where Assessed - Toileting Hygiene: Sit to stand from 3-in-1 or toilet Tub/Shower Transfer: Not assessed Tub/Shower Transfer Method: Not assessed Vision/Perception  Vision - History Baseline Vision: Wears glasses only for reading Patient Visual Report: No change from baseline Vision - Assessment Eye Alignment: Within Functional Limits Perception Perception: Within Functional Limits Cognition   Sensation/Coordination   Extremity Assessment RUE Assessment RUE Assessment: Within Functional Limits Mobility  Bed Mobility Sit to Supine - Right: 5: Supervision Transfers Transfers: Yes Sit to Stand: 5: Supervision Exercises   End of Session OT - End of Session Activity Tolerance: Patient limited by fatigue Patient left: in chair General Behavior During Session: [facility] for tasks performed   Kirt Boys 02/12/2011, 1:17 PM

## 2011-02-12 NOTE — Progress Notes (Signed)
UTILIZATION REVIEW COMPLETE Donna Reese 02/12/2011 [phone] OR [phone]  

## 2011-02-12 NOTE — Progress Notes (Signed)
1730 blood drawn for repeat potassium before  Having bowel movement. Resulted as. 6.5 Pm nurse made aware to repeat . Lab notified

## 2011-02-12 NOTE — Progress Notes (Signed)
Physical Therapy Treatment Patient Details Name: Donna Reese MRN: [id number] DOB: 1931-11-10 Today's Date: 02/12/2011  Pt already up with Mobility Team and OT today.  Pt declining PT secondary anticipating D/C to home this pm.    Sunny Schlein, [place] [phone] 02/12/2011, 2:23 PM

## 2011-02-12 NOTE — Discharge Summary (Signed)
Donna Reese MRN: [id number] DOB/AGE: 30-Jun-1931 77 y.o.  Admit date: 02/08/2011 Discharge date: 02/12/2011  Primary Care Physician:  Hannah Beat, MD, MD   Discharge Diagnoses:   Patient Active Problem List  Diagnoses  . HYPERLIPIDEMIA  . MIGRAINE WITHOUT AURA  . POLYNEUROPATHY IN DIABETES  . HYPERTENSION  . Atrial fibrillation  . Chronic systolic heart failure  . RENAL ARTERY STENOSIS  . PERIPHERAL VASCULAR DISEASE  . COPD  . CKD (chronic kidney disease) stage 4, GFR 15-29 ml/min  . DEGENERATIVE DISC DISEASE  . OSTEOPENIA  . Personal History of Venous Thrombosis and Embolism  . SOB (shortness of breath)  . Peripheral edema  . Chronic diastolic heart failure  . Other malaise and fatigue  . Anemia of chronic disease  . B12 deficiency  . Abdominal pain  . Hypokalemia  . Thrombocytopenia  . Hypomagnesemia  . UTI (lower urinary tract infection)  . Acute renal failure    DISCHARGE MEDICATION: Current Discharge Medication List    CONTINUE these medications which have NOT CHANGED   Details  amLODipine (NORVASC) 10 MG tablet Take 1 tablet (10 mg total) by mouth daily. Qty: 30 tablet, Refills: 6    COLCRYS 0.6 MG tablet TAKE 1 TABLET (0.6 MG TOTAL) BY MOUTH 2 (TWO) TIMES DAILY. Qty: 60 tablet, Refills: 1    ezetimibe-simvastatin (VYTORIN) 10-20 MG per tablet Take 1 tablet by mouth at bedtime.      ferrous sulfate 325 (65 FE) MG tablet Take 325 mg by mouth daily with breakfast.      FLUoxetine (PROZAC) 10 MG capsule Take 10 mg by mouth daily.      hydrALAZINE (APRESOLINE) 10 MG tablet Take 10 mg by mouth Three times a day.    isosorbide mononitrate (IMDUR) 30 MG 24 hr tablet Take 1 tablet (30 mg total) by mouth daily. Qty: 30 tablet, Refills: 11    KLOR-CON 10 10 MEQ CR tablet Take 1 tablet by mouth daily.     latanoprost (XALATAN) 0.005 % ophthalmic solution Place 1 drop into both eyes daily.      metolazone (ZAROXOLYN) 5 MG tablet Take 5 mg by mouth 4  (four) times a week. Monday, Wednesday, Friday, and Saturdays      metoprolol (TOPROL-XL) 50 MG 24 hr tablet Take 25 mg by mouth daily.     mirtazapine (REMERON) 15 MG tablet TAKE 1 TABLET BY MOUTH AT BEDTIME Qty: 30 tablet, Refills: 5    Mometasone Furo-Formoterol Fum (DULERA) 200-5 MCG/ACT AERO Inhale 2 puffs into the lungs 2 (two) times daily. Qty: 1 Inhaler, Refills: 11    NON FORMULARY 2 L/min by Nasal route continuous. oxygen     omeprazole (PRILOSEC) 40 MG capsule Take 40 mg by mouth daily.      ondansetron (ZOFRAN) 4 MG tablet 4 mg every 8 (eight) hours as needed. 1 tab 30 minutes before meals three times a day for nausea     tiotropium (SPIRIVA) 18 MCG inhalation capsule Place 18 mcg into inhaler and inhale daily.      torsemide (DEMADEX) 20 MG tablet Take 1 tablet (20 mg total) by mouth 2 (two) times daily. Qty: 60 tablet, Refills: 4    albuterol (VENTOLIN HFA) 108 (90 BASE) MCG/ACT inhaler Inhale 2 puffs into the lungs every 4 (four) hours as needed. For wheeze or shortness of breath    nitroGLYCERIN (NITROSTAT) 0.4 MG SL tablet Place 0.4 mg under the tongue every 5 (five) minutes as needed. For chest pain  Consults:     SIGNIFICANT DIAGNOSTIC STUDIES:  Ct Abdomen Pelvis Wo Contrast  02/08/2011  *RADIOLOGY REPORT*  Clinical Data: Nausea.  Weakness.  Diabetic.  High blood pressure. History of pulmonary embolus and deep venous thrombosis.  Prior appendectomy, cholecystectomy, hysterectomy and abdominal aortic aneurysm repair.  CT ABDOMEN AND PELVIS WITHOUT CONTRAST  Technique:  Multidetector CT imaging of the abdomen and pelvis was performed following the standard protocol without intravenous contrast.  Comparison: 11/28/2010.  Findings: Prominent coronary artery calcifications.  Cardiomegaly.  Significant atherosclerotic type changes in calcification all visualized vascular structures.  Post abdominal bifemoral bypass without complication noted.  Inferior vena  cava filter is in place.  Noncontrast imaging reveals a small calcification within the liver otherwise no focal hepatic lesion.  Tiny calcification within the spleen otherwise no lesion noted.  Diffuse fatty replacement of the pancreas without surrounding inflammation.  No adrenal lesion.  No hydronephrosis.  Left renal sub centimeter low density structure probably a cyst although incompletely assessed without change.  Post hysterectomy and cholecystectomy.  Small hiatal hernia.  Sigmoid colon colonic diverticula with prominent muscular hypertrophy. The minimal haziness of fat planes surrounding the sigmoid colon are unchanged from the prior exam.  No definitive findings of diverticulitis.  No free fluid or free intraperitoneal air.  Portion of the jejunum with apparent slight wall thickening which may be related to under distension rather than true abnormality (series 2 image 24).    If there are any progressive symptoms this can be reevaluated follow-up.  Hip joint degenerative changes greater left.  The bones are osteopenic. Chronic compression fracture of the L1 vertebral body with mild kyphosis and retropulsion unchanged from prior exam.  IMPRESSION: Sigmoid colon colonic diverticula with prominent muscular hypertrophy. The minimal haziness of fat planes surrounding the sigmoid colon are unchanged from the prior exam.  No definitive findings of acute diverticulitis.  Portion of the jejunum with apparent slight wall thickening which may be related to under distension rather than true abnormality (series 2 image 24).    If there are any progressive symptoms this can be reevaluated follow-up.  Prominent atherosclerotic type changes as noted above.  Original Report Authenticated By: Fuller Canada, M.D.   Ct Head Wo Contrast  02/08/2011  *RADIOLOGY REPORT*  Clinical Data: Headache.  Nausea.  Generalized weakness.  History of diabetes, hypertension, and coronary artery disease.  CT HEAD WITHOUT CONTRAST  02/08/2011:  Technique:  Contiguous axial images were obtained from the base of the skull through the vertex without contrast.  Comparison: Unenhanced cranial CT 01/29/2010 and 06/28/2007 [facility].  MRI brain 05/29/2008 [facility] [address].  Findings: Mild to moderate age appropriate cortical atrophy. Moderate changes of small vessel disease of the white matter diffusely, unchanged. Ventricular system normal in size and appearance for age.  No mass lesion.  No midline shift.  No acute hemorrhage or hematoma.  No extra-axial fluid collections.  No evidence of acute infarction.  No focal osseous abnormality involving the skull.  Mild hyperostosis frontalis interna. Visualized paranasal sinuses, mastoid air cells, and middle ear cavities well-aerated.  Extensive bilateral carotid siphon and vertebral artery atherosclerosis. Note again made of dense calcification involving the choroid plexus of the lateral ventricles and in the foramina of [place].  IMPRESSION:  1.  No acute intracranial abnormality. 2.  Stable mild to moderate cortical atrophy and moderate chronic microvascular ischemic changes of the white matter.  Original Report Authenticated By: Arnell Sieving, M.D.   US Renal  02/08/2011  The *RADIOLOGY REPORT*  Clinical Data: Acute on chronic renal failure.  RENAL/URINARY TRACT ULTRASOUND COMPLETE  Comparison:  CT scan 02/18/2011.Prior ultrasound 11/02/2010.  Findings:  Right Kidney:  9.2 cm in length.  Stable renal cortical thinning without focal mass or hydronephrosis.  Extensive arterial calcifications are noted.  Left Kidney:  9.9 cm in length.  Renal cortical thinning is stable. No hydronephrosis or renal mass.  Extensive renal artery calcifications.  A simple appearing 1.1 cm cyst is noted.  Bladder:  Decompressed but grossly normal.  IMPRESSION: Renal cortical thinning and extensive renal artery calcifications but no hydronephrosis.  Original Report Authenticated By: P. Loralie Champagne, M.D.   Dg Chest Portable 1 View  02/08/2011  *RADIOLOGY REPORT*  Clinical Data: Chest pain, vomiting, short of breath  PORTABLE CHEST - 1 VIEW  Comparison:  chest radiograph 11/28/2010  Findings: Sternotomy wires overlie stable mildly enlarged heart silhouette with ectatic aorta.  Chronic elevation of left hemi diaphragm is stable.  No effusion, infiltrate, or pneumothorax.  IMPRESSION: No acute cardiopulmonary process.  Original Report Authenticated By: Genevive Bi, M.D.     ECHO:The cavity size was normal. Wall thickness was increased in a pattern of moderate LVH. Systolic function was vigorous. The estimated ejection fraction was in the range of 65% to 70%. Wall motion was normal; there were no regional wall motion abnormalities       Recent Results (from the past 240 hour(s))  CLOSTRIDIUM DIFFICILE BY PCR     Status: Normal   Collection Time   02/08/11  1:15 PM      Component Value Range Status Comment   C difficile by pcr NEGATIVE  NEGATIVE  Final   MRSA PCR SCREENING     Status: Normal   Collection Time   02/08/11  8:52 PM      Component Value Range Status Comment   MRSA by PCR NEGATIVE  NEGATIVE  Final     BRIEF ADMITTING H & P:  Patient presents via EMS with epigastric and periumbilical abdominal pain, non radiating, and 5/10 in severity, associated with generalized weakness, nausea, and decreased appetite for several weeks, which has been getting progressively worse. She saw her cardiologist Dr. Excell Seltzer 1 day prior to admission. She has a history of remote aortobifemoral bypass, coronary bypass surgery, diastolic heart failure, chronic kidney disease, DVT/pulmonary embolus with inability to anticoagulate because of chronic GI bleeding. She has had an IVC filter placed. She also has had bilateral renal stents. She denies any vomiting, chest pain, but reports chronic shortness of breath and 3 pillow orthopnea. She denies fevers, chills, urinary concerns such as  dysuria or hematuria, frequency and urgency. She has had significant weight loss in the past year but is not sure how much weight she has actually lost. She denies any change in bowel habits. Per Dr. Earmon Phoenix note she has a significant history of vascular disease and mesenteric ischemia as a concern.      Hospital Course:  Present on Admission: .Acute renal failure the patient presented with acute renal failure. Her usual creatinine is approximately 2.2 however on presentation to the emergency room the patient had a creatinine of 4.5. He felt this was multifactorial owing to overdiuresis and urinary tract infection concurrently. The patient had been on Zaroxolyn and torsemide in the torsemide was held. The patient has had a steady decline in her creatinine. At the time of discharge the patient will not be restarted on her torsemide. The patient was unable  to tell me what her dry weight was have asked her to do her daily weights. We will ask the home care nurse and see the patient at discharge. Marland KitchenUTI (lower urinary tract infection): The patient's urinalysis suggested a urinary tract infection. The patient was started empirically on Levaquin however there was a urine culture to guide therapy. The patient did improve clinically. On currently awaiting a repeat urinalysis to assess the patient's baseline urine at the time of discharge.  .Abdominal pain: The patient presented with abdominal pain which resolved without intervention. The patient also had a diary accompanying this. The patient reported that her diarrhea was long-standing and intermittent this may be consistent with a diagnosis of irritable bowel syndrome. It is quite possible that the patient's renal failure may also have precipitated the abdominal pain and diarrhea which is now resolved. The patient's stool was sent for evaluation and she was found to be C. difficile negative as well as having her stool culture being negative for any pathogens.    .Chronic diastolic heart failure: The patient does have chronic diastolic heart failure. As noted on echocardiogram the patient has moderate LVH. Presently the patient is clinically compensated at her baseline oxygen requirement. Marland KitchenHYPERLIPIDEMIA: Noted  .HYPERTENSION: Patient's blood pressure still elevated she will likely need further titration of her medications as an outpatient.  .CKD (chronic kidney disease) stage 4, GFR 15-29 ml/min: See above. I would recommend if the patient follows an outpatient with nephrology. Presently the patient's renal function and electrolytes are being evaluated. Any further plans will be dependent upon the results       Disposition and Follow-up: Patient is being discharged home with home health nursing. Physical therapy to see the patient and have not recommended the patient have home care physical therapy. The patient is to followup with her primary care physician Dr. Dallas Schimke in 3-5 days. Home health nursing should start the patient on daily weights. The patient should have her creatinine evaluated within 3-5 days. There may need to be reconsideration of restarting her torsemide as her creatinine improves and depending upon her volume status.   Discharge Orders    Future Appointments: Provider: Department: Dept Phone: Center:   02/15/2011 8:30 AM Melissa C. Al[facility]Rammal [facility]  None   02/18/2011 3:00 PM Gwenith Spitz [facility] [phone] None   02/19/2011 11:00 AM Hannah Beat, MD [facility] [phone] [facility]   02/19/2011 2:30 PM [facility] [place] [phone] [facility]   02/19/2011 3:30 PM [facility] [phone] [facility]   06/19/2011 8:30 AM Micheline Chapman, MD [facility] [phone] [facility]      DISCHARGE EXAM: Blood pressure 145/90  pulse 95, temperature 98.7 F (37.1 C) temperature source Oral  resp. rate 22, height 5\' 5"  (1.651 m) weight 68.2 kg (150 lb 5.7 oz) SpO2  96.00%.  General: Alert, awake, oriented x3, in no acute distress.  HEENT: [place]/AT PEERL, EOMI  Neck: Trachea midline, no masses, no thyromegal,y no JVD, no carotid bruit  OROPHARYNX: Moist, No exudate/ erythema/lesions.  Heart: Regular rate and rhythm, without murmurs, rubs, gallops, PMI non-displaced, no heaves or thrills on palpation.  Lungs: Clear to auscultation, no wheezing or rhonchi noted. No increased vocal fremitus resonant to percussion  Abdomen: Soft, nontender, nondistended, positive bowel sounds, no masses no hepatosplenomegaly noted..  Neuro: No focal neurological deficits noted cranial nerves II through XII grossly intact. DTRs 2+ bilaterally upper and lower extremities. Strength 5 out of 5 in bilateral upper and lower extremities.  Musculoskeletal: No warm swelling or erythema around joints, no spinal tenderness noted.  Psychiatric: Patient alert and oriented x3, good insight and cognition, good recent to remote recall.  Lymph node survey: No cervical axillary or inguinal lymphadenopathy noted.      Basename 02/11/11 0502 02/10/11 0631  NA 139 137  K 4.4 3.0*  CL 103 96  CO2 24 23  GLUCOSE 91 90  BUN 67* 76*  CREATININE 3.10* 4.93*  CALCIUM 7.2* 7.0*  MG 1.2* 1.2*  PHOS -- 5.7*   No results found for this basename: AST:2,ALT:2,ALKPHOS:2,BILITOT:2,PROT:2,ALBUMIN:2 in the last 72 hours No results found for this basename: LIPASE:2,AMYLASE:2 in the last 72 hours  Basename 02/11/11 0502 02/10/11 0631  WBC 4.3 7.4  NEUTROABS -- --  HGB 10.3* 11.1*  HCT 30.4* 31.6*  MCV 84.0 83.6  PLT 73* 95*    Signed: Tiffanye Hartmann A. 02/12/2011, 4:28 PM

## 2011-02-13 LAB — URINE MICROSCOPIC-ADD ON

## 2011-02-13 LAB — URINALYSIS, ROUTINE W REFLEX MICROSCOPIC
Bilirubin Urine: NEGATIVE
Hgb urine dipstick: NEGATIVE
Ketones, ur: NEGATIVE mg/dL
Nitrite: NEGATIVE
Specific Gravity, Urine: 1.016 (ref 1.005–1.030)
pH: 5 (ref 5.0–8.0)

## 2011-02-13 LAB — BASIC METABOLIC PANEL
BUN: 44 mg/dL — ABNORMAL HIGH (ref 6–23)
CO2: 28 mEq/L (ref 19–32)
Chloride: 104 mEq/L (ref 96–112)
Glucose, Bld: 119 mg/dL — ABNORMAL HIGH (ref 70–99)
Potassium: 4.5 mEq/L (ref 3.5–5.1)

## 2011-02-13 MED ORDER — TORSEMIDE 10 MG PO TABS: 10.0000 mg | ORAL_TABLET | Freq: Every day | ORAL | Status: DC

## 2011-02-13 MED ORDER — ONDANSETRON HCL 4 MG PO TABS: ORAL_TABLET | ORAL | Status: DC

## 2011-02-13 MED ORDER — NITROGLYCERIN 0.4 MG SL SUBL: 0.4000 mg | SUBLINGUAL_TABLET | SUBLINGUAL | Status: DC | PRN

## 2011-02-13 NOTE — Progress Notes (Signed)
Subjective: Patient has no complaints this morning states that she had the night last night. She's eager to be discharged home. The patient is followed by the heart failure clinic and will be given to the resources to the clinic for her CHF. Objective: Filed Vitals:   02/13/11 0455 02/13/11 0555 02/13/11 0936 02/13/11 1000  BP: 152/60 158/64  177/60  Pulse: 73     Temp: 97.6 F (36.4 C)     TempSrc:      Resp: 16     Height:      Weight:      SpO2: 99%  97%    Weight change: 0.3 kg (10.6 oz)  Intake/Output Summary (Last 24 hours) at 02/13/11 1208 Last data filed at 02/13/11 1107  Gross per 24 hour  Intake    843 ml  Output    950 ml  Net   -107 ml    General: Alert, awake, oriented x3, in no acute distress.  HEENT: /AT PEERL, EOMI Neck: Trachea midline,  no masses, no thyromegal,y no JVD, no carotid bruit OROPHARYNX:  Moist, No exudate/ erythema/lesions.  Heart: Regular rate and rhythm, without murmurs, rubs, gallops, PMI non-displaced, no heaves or thrills on palpation.  Lungs: Clear to auscultation, no wheezing or rhonchi noted. No increased vocal fremitus resonant to percussion  Abdomen: Soft, nontender, nondistended, positive bowel sounds, no masses no hepatosplenomegaly noted..  Neuro: No focal neurological deficits noted cranial nerves II through XII grossly intact. DTRs 2+ bilaterally upper and lower extremities. Strength 3+/5 in bilateral upper and lower extremities. Musculoskeletal: No warm swelling or erythema around joints, no spinal tenderness noted. Psychiatric: Patient alert and oriented x3, good insight and cognition, good recent to remote recall. Lymph node survey: No cervical axillary or inguinal lymphadenopathy noted. Extremities: Patient has 1+ to 2 edema in bilateral lower extremities.     Lab Results:  Basename 02/13/11 0500 02/12/11 1735 02/12/11 1558 02/11/11 0502  NA 139 -- 136 --  K 4.5 6.5* -- --  CL 104 -- 102 --  CO2 28 -- 27 --  GLUCOSE  119* -- 97 --  BUN 44* -- 52* --  CREATININE 1.55* -- 1.63* --  CALCIUM 8.5 -- 8.5 --  MG -- -- -- 1.2*  PHOS -- -- -- --   No results found for this basename: AST:2,ALT:2,ALKPHOS:2,BILITOT:2,PROT:2,ALBUMIN:2 in the last 72 hours No results found for this basename: LIPASE:2,AMYLASE:2 in the last 72 hours  Basename 02/11/11 0502  WBC 4.3  NEUTROABS --  HGB 10.3*  HCT 30.4*  MCV 84.0  PLT 73*   No results found for this basename: CKTOTAL:3,CKMB:3,CKMBINDEX:3,TROPONINI:3 in the last 72 hours No results found for this basename: POCBNP:3 in the last 72 hours No results found for this basename: DDIMER:2 in the last 72 hours No results found for this basename: HGBA1C:2 in the last 72 hours No results found for this basename: CHOL:2,HDL:2,LDLCALC:2,TRIG:2,CHOLHDL:2,LDLDIRECT:2 in the last 72 hours No results found for this basename: TSH,T4TOTAL,FREET3,T3FREE,THYROIDAB in the last 72 hours No results found for this basename: VITAMINB12:2,FOLATE:2,FERRITIN:2,TIBC:2,IRON:2,RETICCTPCT:2 in the last 72 hours  Micro Results: Recent Results (from the past 240 hour(s))  CLOSTRIDIUM DIFFICILE BY PCR     Status: Normal   Collection Time   02/08/11  1:15 PM      Component Value Range Status Comment   C difficile by pcr NEGATIVE  NEGATIVE  Final   MRSA PCR SCREENING     Status: Normal   Collection Time   02/08/11  8:52 PM  Component Value Range Status Comment   MRSA by PCR NEGATIVE  NEGATIVE  Final     Studies/Results: Ct Abdomen Pelvis Wo Contrast  02/08/2011  *RADIOLOGY REPORT*  Clinical Data: Nausea.  Weakness.  Diabetic.  High blood pressure. History of pulmonary embolus and deep venous thrombosis.  Prior appendectomy, cholecystectomy, hysterectomy and abdominal aortic aneurysm repair.  CT ABDOMEN AND PELVIS WITHOUT CONTRAST  Technique:  Multidetector CT imaging of the abdomen and pelvis was performed following the standard protocol without intravenous contrast.  Comparison: 11/28/2010.   Findings: Prominent coronary artery calcifications.  Cardiomegaly.  Significant atherosclerotic type changes in calcification all visualized vascular structures.  Post abdominal bifemoral bypass without complication noted.  Inferior vena cava filter is in place.  Noncontrast imaging reveals a small calcification within the liver otherwise no focal hepatic lesion.  Tiny calcification within the spleen otherwise no lesion noted.  Diffuse fatty replacement of the pancreas without surrounding inflammation.  No adrenal lesion.  No hydronephrosis.  Left renal sub centimeter low density structure probably a cyst although incompletely assessed without change.  Post hysterectomy and cholecystectomy.  Small hiatal hernia.  Sigmoid colon colonic diverticula with prominent muscular hypertrophy. The minimal haziness of fat planes surrounding the sigmoid colon are unchanged from the prior exam.  No definitive findings of diverticulitis.  No free fluid or free intraperitoneal air.  Portion of the jejunum with apparent slight wall thickening which may be related to under distension rather than true abnormality (series 2 image 24).    If there are any progressive symptoms this can be reevaluated follow-up.  Hip joint degenerative changes greater left.  The bones are osteopenic. Chronic compression fracture of the L1 vertebral body with mild kyphosis and retropulsion unchanged from prior exam.  IMPRESSION: Sigmoid colon colonic diverticula with prominent muscular hypertrophy. The minimal haziness of fat planes surrounding the sigmoid colon are unchanged from the prior exam.  No definitive findings of acute diverticulitis.  Portion of the jejunum with apparent slight wall thickening which may be related to under distension rather than true abnormality (series 2 image 24).    If there are any progressive symptoms this can be reevaluated follow-up.  Prominent atherosclerotic type changes as noted above.  Original Report Authenticated  By: Fuller Canada, M.D.   Ct Head Wo Contrast  02/08/2011  *RADIOLOGY REPORT*  Clinical Data: Headache.  Nausea.  Generalized weakness.  History of diabetes, hypertension, and coronary artery disease.  CT HEAD WITHOUT CONTRAST 02/08/2011:  Technique:  Contiguous axial images were obtained from the base of the skull through the vertex without contrast.  Comparison: Unenhanced cranial CT 01/29/2010 and 06/28/2007 [facility].  MRI brain 05/29/2008 [facility] [address].  Findings: Mild to moderate age appropriate cortical atrophy. Moderate changes of small vessel disease of the white matter diffusely, unchanged. Ventricular system normal in size and appearance for age.  No mass lesion.  No midline shift.  No acute hemorrhage or hematoma.  No extra-axial fluid collections.  No evidence of acute infarction.  No focal osseous abnormality involving the skull.  Mild hyperostosis frontalis interna. Visualized paranasal sinuses, mastoid air cells, and middle ear cavities well-aerated.  Extensive bilateral carotid siphon and vertebral artery atherosclerosis. Note again made of dense calcification involving the choroid plexus of the lateral ventricles and in the foramina of [place]chka.  IMPRESSION:  1.  No acute intracranial abnormality. 2.  Stable mild to moderate cortical atrophy and moderate chronic microvascular ischemic changes of the white matter.  Original  Report Authenticated By: Arnell Sieving, M.D.   US Renal  02/08/2011  The *RADIOLOGY REPORT*  Clinical Data: Acute on chronic renal failure.  RENAL/URINARY TRACT ULTRASOUND COMPLETE  Comparison:  CT scan 02/18/2011.Prior ultrasound 11/02/2010.  Findings:  Right Kidney:  9.2 cm in length.  Stable renal cortical thinning without focal mass or hydronephrosis.  Extensive arterial calcifications are noted.  Left Kidney:  9.9 cm in length.  Renal cortical thinning is stable. No hydronephrosis or renal mass.  Extensive renal artery calcifications.   A simple appearing 1.1 cm cyst is noted.  Bladder:  Decompressed but grossly normal.  IMPRESSION: Renal cortical thinning and extensive renal artery calcifications but no hydronephrosis.  Original Report Authenticated By: P. Loralie Champagne, M.D.   Dg Chest Portable 1 View  02/08/2011  *RADIOLOGY REPORT*  Clinical Data: Chest pain, vomiting, short of breath  PORTABLE CHEST - 1 VIEW  Comparison:  chest radiograph 11/28/2010  Findings: Sternotomy wires overlie stable mildly enlarged heart silhouette with ectatic aorta.  Chronic elevation of left hemi diaphragm is stable.  No effusion, infiltrate, or pneumothorax.  IMPRESSION: No acute cardiopulmonary process.  Original Report Authenticated By: Genevive Bi, M.D.    Medications: I have reviewed the patient's current medications. Scheduled Meds:   . Boost Plus  237 mL Oral BID WC  . budesonide-formoterol  2 puff Inhalation BID  . calcium gluconate  1 g Intravenous Once  . enoxaparin  30 mg Subcutaneous Q24H  . ezetimibe-simvastatin  1 tablet Oral QHS  . ferrous sulfate  325 mg Oral Q breakfast  . FLUoxetine  10 mg Oral Daily  . hydrALAZINE  10 mg Oral Q8H  . isosorbide mononitrate  30 mg Oral Daily  . latanoprost  1 drop Both Eyes QHS  . levofloxacin (LEVAQUIN) IV  250 mg Intravenous Q48H  . magnesium oxide  400 mg Oral TID  . metolazone  5 mg Oral 4 times weekly  . metoprolol  25 mg Oral Daily  . pantoprazole  40 mg Oral Q1200  . sodium chloride  3 mL Intravenous Q12H  . sodium polystyrene  30 g Oral STAT  . tiotropium  18 mcg Inhalation Daily  . DISCONTD: potassium chloride  40 mEq Oral Daily   Continuous Infusions:  PRN Meds:.acetaminophen, acetaminophen, albuterol, albuterol, HYDROcodone-acetaminophen, mirtazapine, nitroGLYCERIN, ondansetron (ZOFRAN) IV, senna-docusate, sodium chloride Assessment/Plan: Patient Active Hospital Problem List: Acute renal failure (02/12/2011)   Assessment patient's renal function continues to improve.  Her BUN today's 44 creatinine 1.55    Plan: The patient does have some swelling occurring in her lower extremities thus I will restart her torsemide a lower dose.  Abdominal pain (02/07/2011)   Assessment: Resolved completely patient tolerating diet without any difficulty.    CKD (chronic kidney disease) stage 4, GFR 15-29 ml/min (12/27/2009)   Assessment: Patient approaching baseline creatinine of about 1.5-2.    HYPERLIPIDEMIA (06/25/2006)   Assessment: Noted    HYPERTENSION (06/25/2006)   Assessment: Medications need further titration to achieve goal blood pressure. However will defer to outpatient physician.    Chronic systolic heart failure (11/06/2009)   Assessment: Patient clinically compensated continue with heart failure clinic.   Hyperkalemia (02/08/2011)   Assessment: Patient received Kayexalate and calcium gluconate yesterday. Potassium 4.5.   Hypomagnesemia (02/09/2011)   Assessment: Resolved    UTI (lower urinary tract infection) (02/09/2011)   Assessment: Patient treated empirically with Levaquin treatment completed. Urinalysis profile and improved. Urine no nitrites negative    plan is to DC  home today.   LOS: 5 days

## 2011-02-13 NOTE — Discharge Summary (Signed)
Please see D/C summary from yesterday for details of hospital course.   Hospital Course: 1. Hyperkalemia:Pt's D/C was held due to potassium level of     6.4 which was d/t over supplimentation.     The patient received Kayexalate and Calcium Gluconate.       After treatment pt had a potassium of 4.6 at the time of    discharge.  2. Chronic diastolic heart failure: The patient's Torsemide          was held during the hospitalization due to Acute Renal           Failure. However, on today Donna Reese started to have        some swelling of the lower extremities and thus                     Torsemide was restarted at a lower dose of 10 mg daily.       She will need to have her electrolytes evaluated on Friday      02/15/2011.  Medications: Please see medication list from yesterday's D/C summary.  Additional medication:Torsemide 10 mg po daily

## 2011-02-13 NOTE — Progress Notes (Signed)
PT HAS BEEN REFERRED TO [facility] TO FOLLOW CHF DISEASE AND PT IS HAVING HH RN FOR DISEASE MANAGEMENT WITH [facility] AND [facility] TO BE DRAWN ON Friday NOV. 16TH AND REPORTED TO DR. Dallas Schimke.  Willa Rough 02/13/2011 [phone] OR [phone]

## 2011-02-14 ENCOUNTER — Other Ambulatory Visit: Payer: Self-pay | Admitting: Family Medicine

## 2011-02-14 DIAGNOSIS — E875 Hyperkalemia: Secondary | ICD-10-CM

## 2011-02-15 ENCOUNTER — Encounter (INDEPENDENT_AMBULATORY_CARE_PROVIDER_SITE_OTHER): Payer: Medicare Other | Admitting: Cardiology

## 2011-02-15 ENCOUNTER — Telehealth: Payer: Self-pay | Admitting: Cardiovascular Disease

## 2011-02-15 DIAGNOSIS — K551 Chronic vascular disorders of intestine: Secondary | ICD-10-CM

## 2011-02-15 DIAGNOSIS — R109 Unspecified abdominal pain: Secondary | ICD-10-CM

## 2011-02-15 NOTE — Telephone Encounter (Signed)
New message:  Nurse called and they have some confusion with medication, please call her back regarding this.

## 2011-02-15 NOTE — Telephone Encounter (Signed)
Judeth Cornfield contacted our office because she needs a current list of the pt's medication from discharge.  I read all of the medications listed on the 02/12/11 discharge summary.  I also made her aware of an append on 02/13/11 that states the pt will restart Torsemide at 10mg  daily.  The pt is scheduled to see her PCP next week and a BMP is being checked today for PCP to follow.

## 2011-02-18 ENCOUNTER — Other Ambulatory Visit: Payer: Medicare Other | Admitting: Lab

## 2011-02-18 ENCOUNTER — Other Ambulatory Visit: Payer: Medicare Other

## 2011-02-18 ENCOUNTER — Telehealth: Payer: Self-pay | Admitting: *Deleted

## 2011-02-18 NOTE — Telephone Encounter (Signed)
Advised pt to bring all pill bottles, she said she would.

## 2011-02-18 NOTE — Telephone Encounter (Signed)
Home health nurse called to report that pt's BP today was 172/64 in right arm, 158/66 in left arm.  She has one episode of chest pain last night but didn't take ntg because the episode didn't last very long.  She has a script for hydralazine 10 mg's tid, but this isn't listed on her hospital d/c summary.  She has appt to see you tomorrow.

## 2011-02-18 NOTE — Telephone Encounter (Signed)
Please send / bring ALL pill bottles. They altered some of her medications, so I may have to adjust tomorrow.   She is not supposed to be on hydralazine --- we stopped that due to some problems it was causing with her platelet count.  I want to see all med bottles tomorrow to give a good reconciliation

## 2011-02-19 ENCOUNTER — Other Ambulatory Visit (INDEPENDENT_AMBULATORY_CARE_PROVIDER_SITE_OTHER): Payer: Medicare Other | Admitting: *Deleted

## 2011-02-19 ENCOUNTER — Other Ambulatory Visit: Payer: Self-pay | Admitting: *Deleted

## 2011-02-19 ENCOUNTER — Ambulatory Visit (INDEPENDENT_AMBULATORY_CARE_PROVIDER_SITE_OTHER): Payer: Medicare Other | Admitting: *Deleted

## 2011-02-19 ENCOUNTER — Ambulatory Visit (INDEPENDENT_AMBULATORY_CARE_PROVIDER_SITE_OTHER): Payer: Medicare Other | Admitting: Family Medicine

## 2011-02-19 ENCOUNTER — Encounter: Payer: Self-pay | Admitting: Family Medicine

## 2011-02-19 VITALS — BP 130/78 | HR 70 | Temp 97.9°F | Ht 65.0 in | Wt 145.0 lb

## 2011-02-19 DIAGNOSIS — N184 Chronic kidney disease, stage 4 (severe): Secondary | ICD-10-CM

## 2011-02-19 DIAGNOSIS — K551 Chronic vascular disorders of intestine: Secondary | ICD-10-CM | POA: Insufficient documentation

## 2011-02-19 DIAGNOSIS — I5032 Chronic diastolic (congestive) heart failure: Secondary | ICD-10-CM

## 2011-02-19 DIAGNOSIS — J449 Chronic obstructive pulmonary disease, unspecified: Secondary | ICD-10-CM

## 2011-02-19 DIAGNOSIS — D638 Anemia in other chronic diseases classified elsewhere: Secondary | ICD-10-CM

## 2011-02-19 DIAGNOSIS — R0989 Other specified symptoms and signs involving the circulatory and respiratory systems: Secondary | ICD-10-CM

## 2011-02-19 DIAGNOSIS — I739 Peripheral vascular disease, unspecified: Secondary | ICD-10-CM

## 2011-02-19 DIAGNOSIS — I509 Heart failure, unspecified: Secondary | ICD-10-CM

## 2011-02-19 MED ORDER — MOMETASONE FURO-FORMOTEROL FUM 200-5 MCG/ACT IN AERO: 2.0000 | INHALATION_SPRAY | Freq: Two times a day (BID) | RESPIRATORY_TRACT | Status: DC

## 2011-02-19 MED ORDER — FERROUS SULFATE 325 (65 FE) MG PO TABS: 325.0000 mg | ORAL_TABLET | Freq: Every day | ORAL | Status: DC

## 2011-02-19 NOTE — Patient Instructions (Signed)
Recheck 1 month

## 2011-02-19 NOTE — Progress Notes (Signed)
Patient Name: Donna Reese Date of Birth: 31-Jul-1931 Age: 77 y.o. Medical Record Number: [id number] Gender: female  History of Present Illness:  Donna Reese is a 77 y.o. very pleasant female patient who presents with the following:  Followup in a patient with severe diastolic heart failure, COPD, oxygen dependence, persistent edema, shortness of breath, history of multiple thromboses, atrial fibrillation, and other medical problems. She was recently hospitalized with a urinary tract infection, dehydration, and acute renal failure with a creatinine greater than 4. While in the hospital they adjusted her diuretics, her creatinine decreased down significantly, and now she is on torsemide 10 mg daily. He also restarted her metolazone that had previously been discontinued.  The hospitalist team also restarted her hydralazine, which previously had been stopped secondary to thrombocytopenia.  Hosp f/u: still feels weak - still hurting some in right lung. She also has had a great deal of weight loss, approaching 100 pounds in the last year. She is a 25 pound weight loss in the last few months. She is having some persistent nausea and abdominal discomfort, but those symptoms have been relieved slightly in taking some Zofran prior to meals.  Past Medical History, Surgical History, Social History, Family History, and Problem List have been reviewed in EHR and updated if relevant.  Review of Systems: Increase in edema. Shortness of breath at baseline. The patient is out of her [facility]. No significant pain. She is in better spirits compared to her last office visit.  Physical Examination: Filed Vitals:   02/19/11 1057  BP: 130/78  Pulse: 70  Temp: 97.9 F (36.6 C)  TempSrc: Oral  Height: 5\' 5"  (1.651 m)  Weight: 145 lb (65.772 kg)  SpO2: 98%    GEN: WDWN, NAD, Non-toxic, A & O x 3 HEENT: Atraumatic, Normocephalic. Neck supple. No masses, No LAD. Ears and Nose: No external deformity. CV:  irreg irreg. + JVD. No thrill.  PULM: CTA B, no wheezes, crackles, rhonchi. No retractions. No resp. distress. No accessory muscle use. EXTR: 1+ LE Edema NEURO Normal gait.  PSYCH: Normally interactive. Conversant. Not depressed or anxious appearing.  Calm demeanor.   Assessment and Plan:   1. COPD  Mometasone Furo-Formoterol Fum (DULERA) 200-5 MCG/ACT AERO  2. Anemia of chronic disease    3. Chronic diastolic heart failure    4. CKD (chronic kidney disease) stage 4, GFR 15-29 ml/min    5. Mesenteric ischemia, chronic     Refilled Dulera.  D/c hydralazine Iron Sulfate 325 mg daily  Keep the patient on a lower dose of diuretics, given her recurrent problems with renal failure, dehydration, and acute renal failure. I given her shortness of breath is primarily coming from COPD.  I discussed the case with Dr. Excell Seltzer, and Ms. Hladik unfortunately has some very significant mesenteric ischemia. I think that this is our cause of her significant weight loss. Is a very challenging case in light of her multiple medical comorbidities and her problems with renal failure recently. We discussed involving vascular surgery, which I think is reasonable. We also discussed involving potentially hospice, which I also think is very reasonable. Previously, I discussed this several times with Ms. Schrecengost, and she has not come to the point where she wanted to consult Hospice.  Medications Discontinued During This Encounter  Medication Reason  . ferrous sulfate 325 (65 FE) MG tablet   . hydrALAZINE (APRESOLINE) 10 MG tablet   . Ferrous Sulfate (IRON) 28 MG TABS   . Mometasone Furo-Formoterol  Fum (DULERA) 200-5 MCG/ACT AERO Reorder

## 2011-02-20 ENCOUNTER — Telehealth: Payer: Self-pay | Admitting: Family Medicine

## 2011-02-20 NOTE — Telephone Encounter (Signed)
Requesting Dr. Patsy Lager send letter to Dr. Caryn Section.  She was unable to get letter delivered to Dr. Caryn Section.  Please call back.

## 2011-02-25 ENCOUNTER — Other Ambulatory Visit: Payer: Self-pay

## 2011-02-25 ENCOUNTER — Other Ambulatory Visit: Payer: Self-pay | Admitting: Family Medicine

## 2011-02-25 NOTE — Telephone Encounter (Signed)
I faxed them a copy, can you also print and mail to Dr. Caryn Section

## 2011-02-25 NOTE — Telephone Encounter (Signed)
Copy mailed to dr fox at [facility]

## 2011-02-27 ENCOUNTER — Encounter: Payer: Self-pay | Admitting: Vascular Surgery

## 2011-02-27 ENCOUNTER — Telehealth: Payer: Self-pay | Admitting: Cardiology

## 2011-02-27 NOTE — Telephone Encounter (Signed)
FYI pt has appt tomorrow, 11-29 @ 11:15am with dr fields for mesenteric ischemia

## 2011-02-27 NOTE — Telephone Encounter (Signed)
Will make dr crenshaw aware Donna Reese  

## 2011-02-28 ENCOUNTER — Ambulatory Visit (INDEPENDENT_AMBULATORY_CARE_PROVIDER_SITE_OTHER): Payer: Medicare Other | Admitting: Vascular Surgery

## 2011-02-28 ENCOUNTER — Encounter: Payer: Self-pay | Admitting: Vascular Surgery

## 2011-02-28 VITALS — BP 170/74 | Resp 67 | Ht 65.0 in | Wt 152.0 lb

## 2011-02-28 DIAGNOSIS — K551 Chronic vascular disorders of intestine: Secondary | ICD-10-CM

## 2011-02-28 NOTE — Progress Notes (Signed)
[facility] [place] HISTORY AND PHYSICAL   History of Present Illness:  Patient is a 77 y.o. year old female who presents for evaluation of chronic mesenteric ischemia.  The patient recently had a duplex exam at [facility] which showed occlusion of the inferior mesenteric artery, occlusion of the celiac artery, and high grade subtotal occlusion of the superior mesenteric artery. The patient denies food fear. She has occasional postprandial pain. She has diarrhea after every meal. This has been going on 6-7 months. She has lost close to 20 pounds. She also has known renovascular disease and has had previous bilateral renal stents. She also previously underwent aortobifemoral bypass graft by Dr. Hart Rochester in 2006. She has a history of chronic atrial fibrillation and has not been on Coumadin due to recent history of GI bleed. She also has a previous history of an inferior vena cava filter. She also has a history of contrast nephropathy in the past. She is a former tobacco user and quit 20 years ago. She has severe COPD requiring home oxygen therapy continuously. Other medical problems include hypertension hyperlipidemia and diastolic heart failure. These problems are currently controlled and followed by Dr. Excell Seltzer.  Past Medical History  Diagnosis Date  . Diabetes mellitus   . Diverticulosis   . Osteopenia   . PUD (peptic ulcer disease)   . Peripheral arterial disease   . Hypertension   . Hyperlipidemia   . Diastolic heart failure     a. echo 9/11: EF 60-65%, mild LVH, grade 2 diast dysfxn, AV mean gradient 9 mmHg, mild MR, RVE, RVSP 59  . Coronary artery disease     a. s/p CABG;  b. cath 8/09: LM occluded; RCA 50%; L-LAD ok, Radial-OM ok; bilat renal art stenosis  . Renovascular hypertension   . Renal artery stenosis     a. s/p L RA stent 8/09  . Paroxysmal a-fib     no coumadin 2/2 GI Blee  . DVT (deep venous thrombosis)   . Pulmonary embolus     s/p IVC filter  .  History of gastrointestinal bleeding     no ASA or coumadin  . Contrast dye induced nephropathy   . Orthostatic hypotension   . CHF (congestive heart failure)   . COPD (chronic obstructive pulmonary disease)   . Mesenteric ischemia, chronic     Past Surgical History  Procedure Date  . Appendectomy   . Cholecystectomy   . Abdominal hysterectomy   . Coronary artery bypass graft 09-04-2001    carotid u/s unchnaged 40-50% ICA stenosis 12/1998  . Cataract extraction, bilateral 2001  . Abdominal aortic aneurysm repair 1997  . Nm myoview ltd     neg EF 76%--06/26/2004  . Cardiac catheterization 09-03-01  . Esophagogastroduodenoscopy     abnormal 2007, esophageal stricture 10-12-03-07 gastritis and major bleef  . Vena cava filter placement 07-2007    Dr. Excell Seltzer  . Renal artery doppler 11-17-2008    severe iliac stenosis     Social History History  Substance Use Topics  . Smoking status: Former Smoker -- 1.0 packs/day for 40 years    Types: Cigarettes    Quit date: 04/01/1990  . Smokeless tobacco: Not on file  . Alcohol Use: No    Family History Family History  Problem Relation Age of Onset  . Cancer Mother   . Stroke Father   . Stroke Brother   . Heart attack Brother   . Heart attack Brother   .  Heart attack Brother     Allergies  Allergies  Allergen Reactions  . Meperidine Hcl   . Niacin     REACTION: rash  . Penicillins   . Sulfonamide Derivatives   . Codeine Rash  . Morphine Rash  . Propoxyphene Hcl Rash     Current Outpatient Prescriptions  Medication Sig Dispense Refill  . albuterol (VENTOLIN HFA) 108 (90 BASE) MCG/ACT inhaler Inhale 2 puffs into the lungs every 4 (four) hours as needed. For wheeze or shortness of breath      . amLODipine (NORVASC) 10 MG tablet Take 1 tablet (10 mg total) by mouth daily.  30 tablet  6  . Boost Plus (BOOST PLUS) LIQD Take 237 mLs by mouth 2 (two) times daily with a meal.  30 Can  1  . COLCRYS 0.6 MG tablet TAKE 1 TABLET (0.6  MG TOTAL) BY MOUTH 2 (TWO) TIMES DAILY.  60 tablet  1  . ezetimibe-simvastatin (VYTORIN) 10-20 MG per tablet Take 1 tablet by mouth at bedtime.        . ferrous sulfate 325 (65 FE) MG tablet Take 1 tablet (325 mg total) by mouth daily with breakfast.  30 tablet  11  . FLUoxetine (PROZAC) 10 MG capsule Take 10 mg by mouth daily.        . isosorbide mononitrate (IMDUR) 30 MG 24 hr tablet Take 1 tablet (30 mg total) by mouth daily.  30 tablet  11  . KLOR-CON 10 10 MEQ CR tablet Take 1 tablet by mouth daily.       Marland Kitchen latanoprost (XALATAN) 0.005 % ophthalmic solution Place 1 drop into both eyes daily.        . metolazone (ZAROXOLYN) 5 MG tablet Take 5 mg by mouth 4 (four) times a week. Monday, Wednesday, Friday, and Saturdays        . metoprolol (TOPROL-XL) 50 MG 24 hr tablet Take 25 mg by mouth daily.       . mirtazapine (REMERON) 15 MG tablet TAKE 1 TABLET BY MOUTH AT BEDTIME  30 tablet  5  . Mometasone Furo-Formoterol Fum (DULERA) 200-5 MCG/ACT AERO Inhale 2 puffs into the lungs 2 (two) times daily.  1 Inhaler  11  . nitroGLYCERIN (NITROSTAT) 0.4 MG SL tablet Place 1 tablet (0.4 mg total) under the tongue every 5 (five) minutes as needed for chest pain.  25 tablet  2  . NON FORMULARY 2 L/min by Nasal route continuous. oxygen       . omeprazole (PRILOSEC) 40 MG capsule Take 40 mg by mouth daily.        . ondansetron (ZOFRAN) 4 MG tablet 1 tab 30 minutes before meals three times a day  90 tablet  3  . tiotropium (SPIRIVA) 18 MCG inhalation capsule Place 18 mcg into inhaler and inhale daily.        Marland Kitchen torsemide (DEMADEX) 10 MG tablet Take 1 tablet (10 mg total) by mouth daily.  30 tablet  0    ROS:   General:  weight loss, no Fever, chills  HEENT: No recent headaches, no nasal bleeding, no visual changes, no sore throat  Neurologic: No dizziness, blackouts, seizures. No recent symptoms of stroke or mini- stroke. No recent episodes of slurred speech, or temporary blindness.  Cardiac: has  occasional  recent episodes of chest pain/pressure, no shortness of breath at rest.  Vascular: No history of rest pain in feet.  No history of claudication.  No history of non-healing ulcer  Pulmonary: uses home oxygen, no productive cough, no hemoptysis  Musculoskeletal:  [ ]  Arthritis, [ ]  Low back pain,  [ ]  Joint pain  Hematologic:No history of hypercoagulable state.  No history of easy bleeding.  No history of anemia  Gastrointestinal: No hematochezia or melena,  No gastroesophageal reflux, no trouble swallowing  Urinary: [x ] chronic Kidney disease, [ ]  on HD - [ ]  MWF or [ ]  TTHS, [ ]  Burning with urination, [ ]  Frequent urination, [ ]  Difficulty urinating;   Skin: No rashes  Psychological: No history of anxiety,  No history of depression   Physical Examination  Filed Vitals:   02/28/11 1143  BP: 170/74  Resp: 67  Height: 5\' 5"  (1.651 m)  Weight: 152 lb (68.947 kg)  SpO2: 95%    Body mass index is 25.29 kg/(m^2).  General:  Alert and oriented, no acute distress HEENT: Normal Neck: No bruit or JVD Pulmonary: Clear to auscultation bilaterally Cardiac: Regular Rate and Rhythm without murmur Abdomen: Soft, non-tender, non-distended, no mass, midline scar, epigastric bruit not appreciated Skin: No rash Extremity Pulses:  2+  Femoral pulses bilaterally Musculoskeletal: No deformity or edema  Neurologic: Upper and lower extremity motor 5/5 and symmetric  DATA: I reviewed her mesenteric duplex exam which is consistent with three-vessel vascular disease   ASSESSMENT: Patient with symptoms of chronic mesenteric ischemia with evidence of three-vessel mesenteric disease. However, she is not an open operation candidate due to her severe COPD and cardiac dysfunction. She would potentially be a candidate for endovascular stenting of her superior mesenteric or celiac artery. However, the appearance of these on CT scan shows it is heavily calcified it is a chronic total occlusion  I do not believe she is going to be a very good candidate for this. She would also be at risk of contrast nephropathy and possible dialysis.   PLAN: I had a lengthy discussion with the patient today regarding his options. At this point she has opted for no intervention. I also discussed with her that she presents to the emergency room with thrombosis and get infarction that we would not entertain an emergent operation. She understands this as well. I discussed with her the possibility of doing a diagnostic arteriogram at some point future if she wishes. Currently she does not wish to undergo this procedure and is at peace with her decision not to intervene and feels that her quality of life is not severely compromised currently. She will followup with [place] on as-needed basis.  Fabienne Bruns, MD [facility] [place] Office: [phone] Pager: [phone]

## 2011-03-04 ENCOUNTER — Other Ambulatory Visit: Payer: Self-pay | Admitting: Family Medicine

## 2011-03-04 DIAGNOSIS — N184 Chronic kidney disease, stage 4 (severe): Secondary | ICD-10-CM

## 2011-03-04 DIAGNOSIS — I5022 Chronic systolic (congestive) heart failure: Secondary | ICD-10-CM

## 2011-03-04 DIAGNOSIS — I5032 Chronic diastolic (congestive) heart failure: Secondary | ICD-10-CM

## 2011-03-04 DIAGNOSIS — J449 Chronic obstructive pulmonary disease, unspecified: Secondary | ICD-10-CM

## 2011-03-04 DIAGNOSIS — K551 Chronic vascular disorders of intestine: Secondary | ICD-10-CM

## 2011-03-04 NOTE — Procedures (Unsigned)
CAROTID DUPLEX EXAM  INDICATION:  Bilateral bruit.  History, left renal stent, DVT.  HISTORY: Diabetes:  Yes. Cardiac:  Yes. Hypertension: Smoking:  Previous. Previous Surgery:  AAA repair with aortobifem graft in 1997. CV History: Amaurosis Fugax No, Paresthesias No, Hemiparesis No.                                      RIGHT               LEFT Brachial systolic pressure:         179                 168 Brachial Doppler waveforms:         WNL                 WNL Vertebral direction of flow:        Abnormal            Antegrade DUPLEX VELOCITIES (cm/sec) CCA peak systolic                   78                  101 ECA peak systolic                   143                 85 ICA peak systolic                   155                 157 ICA end diastolic                   17                  33 PLAQUE MORPHOLOGY:                  Heterogenous/calcific                  Heterogenous/calcific PLAQUE AMOUNT:                      Moderate            Moderate PLAQUE LOCATION:                    ICA                 ICA  IMPRESSION: 1. 1% to 39% bilateral internal carotid artery stenosis, left worse     than right.  Plaque is calcific, and recorded velocities may be     underestimated. 2. Right vertebral artery is abnormally antegrade; left vertebral     artery is within normal limits. 3. Right subclavian artery is moderately stenotic; left subclavian     artery is within normal limits.  ___________________________________________ Quita Skye Hart Rochester, M.D.  LT/MEDQ  D:  02/20/2011  T:  02/20/2011  Job:  [id number]

## 2011-03-13 ENCOUNTER — Other Ambulatory Visit: Payer: Self-pay | Admitting: Family Medicine

## 2011-03-15 ENCOUNTER — Telehealth: Payer: Self-pay | Admitting: Internal Medicine

## 2011-03-15 NOTE — Telephone Encounter (Signed)
Patient has an appointment on Tuesday and would like a DNR form signed for her at her appointment.

## 2011-03-18 DIAGNOSIS — J449 Chronic obstructive pulmonary disease, unspecified: Secondary | ICD-10-CM

## 2011-03-18 DIAGNOSIS — E1149 Type 2 diabetes mellitus with other diabetic neurological complication: Secondary | ICD-10-CM

## 2011-03-18 DIAGNOSIS — I509 Heart failure, unspecified: Secondary | ICD-10-CM

## 2011-03-18 DIAGNOSIS — E1142 Type 2 diabetes mellitus with diabetic polyneuropathy: Secondary | ICD-10-CM

## 2011-03-19 ENCOUNTER — Ambulatory Visit (INDEPENDENT_AMBULATORY_CARE_PROVIDER_SITE_OTHER): Payer: Medicare Other | Admitting: Family Medicine

## 2011-03-19 ENCOUNTER — Encounter: Payer: Self-pay | Admitting: Family Medicine

## 2011-03-19 DIAGNOSIS — I509 Heart failure, unspecified: Secondary | ICD-10-CM

## 2011-03-19 DIAGNOSIS — J449 Chronic obstructive pulmonary disease, unspecified: Secondary | ICD-10-CM

## 2011-03-19 DIAGNOSIS — J4489 Other specified chronic obstructive pulmonary disease: Secondary | ICD-10-CM

## 2011-03-19 DIAGNOSIS — I5032 Chronic diastolic (congestive) heart failure: Secondary | ICD-10-CM

## 2011-03-19 DIAGNOSIS — Z515 Encounter for palliative care: Secondary | ICD-10-CM

## 2011-03-19 DIAGNOSIS — Z66 Do not resuscitate: Secondary | ICD-10-CM

## 2011-03-19 DIAGNOSIS — K551 Chronic vascular disorders of intestine: Secondary | ICD-10-CM

## 2011-03-19 NOTE — Progress Notes (Signed)
  Patient Name: Donna Reese Date of Birth: 04-06-1931 Age: 77 y.o. Medical Record Number: [id number] Gender: female  History of Present Illness:  Donna Reese is a 77 y.o. very pleasant female patient who presents with the following:  F/u with severe inoperable mesenteric ischemia, with a great deal of weight loss, nausea, difficulty eating, daily diarrhea. COPD on oxygen at baseline with CHF, CKD, AF.  We recently placed her on hospice, and she is at peace with this and the transition in life and the afterlife. She is not in any pain and understands her condition.  She does not want any further invasive testing or procedures and desires no further hospitalization. I signed a goldenrod DNR for her to keep at home also.  Past Medical History, Surgical History, Social History, Family History, and Problem List have been reviewed in EHR and updated if relevant.  Review of Systems: SOB at baseline, some mild edema. No pain. No fever.  Physical Examination: Filed Vitals:   03/19/11 0934  BP: 110/60  Pulse: 64  Temp: 97.5 F (36.4 C)  TempSrc: Oral  Height: 5\' 5"  (1.651 m)  Weight: 139 lb 3.2 oz (63.141 kg)  SpO2: 100%    Body mass index is 23.16 kg/(m^2).   GEN: WDWN, NAD, Non-toxic, A & O x 3 HEENT: Atraumatic, Normocephalic. Neck supple. No masses, No LAD. Ears and Nose: No external deformity. CV: irreg irreg PULM: CTA B, no wheezes, crackles, rhonchi. No retractions. No resp. distress. No accessory muscle use. EXTR: 1+ LE edema NEURO Normal gait.  PSYCH: Normally interactive. Conversant. Not depressed or anxious appearing.  Calm demeanor.    Assessment and Plan: 1. Mesenteric ischemia, chronic   2. COPD   3. Chronic diastolic heart failure   4. DNR (do not resuscitate)   5. Hospice care    >25 minutes spent in face to face time with patient, >50% spent in counselling or coordination of care:  At peace with transition to hospice. Will follow along with hospice  and assist with her care at home. We took some time to discuss end of life. She is not driving now, has assistance in the home. Plans are set for her and her husband. Overall, doing reasonably well. F/u with me in 3 mo if able.

## 2011-03-29 ENCOUNTER — Telehealth: Payer: Self-pay | Admitting: *Deleted

## 2011-03-29 MED ORDER — CEPHALEXIN 500 MG PO CAPS: 500.0000 mg | ORAL_CAPSULE | Freq: Three times a day (TID) | ORAL | Status: AC

## 2011-03-29 NOTE — Telephone Encounter (Signed)
Patient refusing to be seen. There are no blisters and the spot is warm to touch also no fever

## 2011-03-29 NOTE — Telephone Encounter (Signed)
More concferning for infection than for shingles...the patient needs to have area examined.. Can she be seen by anyone in office today?Donna Reese. If not consider urgent care or [facility] .. I would not wait until Monday.

## 2011-03-29 NOTE — Telephone Encounter (Signed)
Hospice nurse called says patient has red spot on foot the is red and warm(maybe a bite or cellulitis) Patient says it feels like the shingles when she had them. Patient say their is a little pain but, mostly just burning. Please advise on what to day. Patient has had this since the weekend.

## 2011-03-29 NOTE — Telephone Encounter (Signed)
Patient family advised 

## 2011-03-29 NOTE — Telephone Encounter (Signed)
Will send in antibiotic to cover emperically (sent in keflex, but please verify pt has had not SE to this in past, small risk of crossreactivity in penicillin allergic patients)... But I would recommend follow up with PCP next week for eval of rash for worsening etc. This is high risk pt. If redness spreading or fever despite antibiotics... Go to Urgent care/ER.

## 2011-04-03 ENCOUNTER — Telehealth: Payer: Self-pay | Admitting: Internal Medicine

## 2011-04-03 ENCOUNTER — Telehealth: Payer: Self-pay | Admitting: *Deleted

## 2011-04-03 MED ORDER — PROMETHAZINE HCL 12.5 MG PO TABS: ORAL_TABLET | ORAL | Status: DC

## 2011-04-03 MED ORDER — DOLASETRON MESYLATE 100 MG PO TABS: 100.0000 mg | ORAL_TABLET | Freq: Three times a day (TID) | ORAL | Status: AC | PRN

## 2011-04-03 NOTE — Telephone Encounter (Signed)
Rx called to pharmacist at [facility].  Asher Muir advised via telephone.

## 2011-04-03 NOTE — Telephone Encounter (Signed)
Phenergan 12.5 mg po q 6 hours prn nausea. OK to crush. #50, 2 refills

## 2011-04-03 NOTE — Telephone Encounter (Signed)
rx sent to pharmacy and patient advised. 

## 2011-04-03 NOTE — Telephone Encounter (Signed)
Cordelia Pen called from [facility] and stated the patient is having nausea and vomiting and the Zofran isn't controlling it and wanted to know if we could call in something else for her.    Cordelia Pen [phone]

## 2011-04-03 NOTE — Telephone Encounter (Signed)
Anzemet 100 mg, 1 po q 8 hours prn nausea, #30, 2 refills

## 2011-04-03 NOTE — Telephone Encounter (Signed)
Donna Reese went to [facility] to pick up a Rx for Mrs. Maita and was told it wasn't ready.  I called and spoke to the pharmacist and was advised that the Rx was on hold because the cost of the medication exceeded what patients insurance will cover.  Donna Reese wanted to know if a different medication be sent in that maybe isn't so expensive because she knows that her grandmother will not be able to afford it.  Please advise.

## 2011-04-04 ENCOUNTER — Ambulatory Visit: Payer: Medicare Other | Admitting: Family Medicine

## 2011-05-27 ENCOUNTER — Other Ambulatory Visit: Payer: Self-pay

## 2011-05-27 ENCOUNTER — Encounter (HOSPITAL_COMMUNITY): Payer: Self-pay

## 2011-05-27 ENCOUNTER — Emergency Department (HOSPITAL_COMMUNITY)

## 2011-05-27 ENCOUNTER — Emergency Department (HOSPITAL_COMMUNITY)
Admission: EM | Admit: 2011-05-27 | Discharge: 2011-05-27 | Disposition: A | Discharge status: Home / Self Care | Attending: Emergency Medicine | Admitting: Emergency Medicine

## 2011-05-27 DIAGNOSIS — R0989 Other specified symptoms and signs involving the circulatory and respiratory systems: Secondary | ICD-10-CM | POA: Insufficient documentation

## 2011-05-27 DIAGNOSIS — I509 Heart failure, unspecified: Secondary | ICD-10-CM

## 2011-05-27 DIAGNOSIS — E119 Type 2 diabetes mellitus without complications: Secondary | ICD-10-CM | POA: Insufficient documentation

## 2011-05-27 DIAGNOSIS — R0682 Tachypnea, not elsewhere classified: Secondary | ICD-10-CM | POA: Insufficient documentation

## 2011-05-27 DIAGNOSIS — Z79899 Other long term (current) drug therapy: Secondary | ICD-10-CM | POA: Insufficient documentation

## 2011-05-27 DIAGNOSIS — I10 Essential (primary) hypertension: Secondary | ICD-10-CM | POA: Insufficient documentation

## 2011-05-27 DIAGNOSIS — I499 Cardiac arrhythmia, unspecified: Secondary | ICD-10-CM | POA: Insufficient documentation

## 2011-05-27 DIAGNOSIS — I25810 Atherosclerosis of coronary artery bypass graft(s) without angina pectoris: Secondary | ICD-10-CM | POA: Insufficient documentation

## 2011-05-27 DIAGNOSIS — R0602 Shortness of breath: Secondary | ICD-10-CM | POA: Insufficient documentation

## 2011-05-27 DIAGNOSIS — Z86718 Personal history of other venous thrombosis and embolism: Secondary | ICD-10-CM | POA: Insufficient documentation

## 2011-05-27 DIAGNOSIS — M549 Dorsalgia, unspecified: Secondary | ICD-10-CM

## 2011-05-27 LAB — DIFFERENTIAL
Basophils Absolute: 0 10*3/uL (ref 0.0–0.1)
Basophils Relative: 0 % (ref 0–1)
Eosinophils Absolute: 0 K/uL (ref 0.0–0.7)
Eosinophils Relative: 0 % (ref 0–5)
Lymphocytes Relative: 6 % — ABNORMAL LOW (ref 12–46)
Lymphs Abs: 0.4 K/uL — ABNORMAL LOW (ref 0.7–4.0)
Monocytes Absolute: 0.1 10*3/uL (ref 0.1–1.0)
Monocytes Relative: 1 % — ABNORMAL LOW (ref 3–12)
Neutro Abs: 5.5 10*3/uL (ref 1.7–7.7)
Neutrophils Relative %: 92 % — ABNORMAL HIGH (ref 43–77)

## 2011-05-27 LAB — APTT: aPTT: 32 s (ref 24–37)

## 2011-05-27 LAB — COMPREHENSIVE METABOLIC PANEL
AST: 49 U/L — ABNORMAL HIGH (ref 0–37)
Albumin: 2.7 g/dL — ABNORMAL LOW (ref 3.5–5.2)
Alkaline Phosphatase: 179 U/L — ABNORMAL HIGH (ref 39–117)
CO2: 37 mEq/L — ABNORMAL HIGH (ref 19–32)
Chloride: 92 mEq/L — ABNORMAL LOW (ref 96–112)
Creatinine, Ser: 1.06 mg/dL (ref 0.50–1.10)
GFR calc non Af Amer: 49 mL/min — ABNORMAL LOW (ref >90)
Potassium: 3.1 mEq/L — ABNORMAL LOW (ref 3.5–5.1)
Total Bilirubin: 0.6 mg/dL (ref 0.3–1.2)

## 2011-05-27 LAB — TROPONIN I
Troponin I: <0.3 ng/mL (ref <0.30)
Troponin I: <0.3 ng/mL (ref <0.30)

## 2011-05-27 LAB — CBC
HCT: 29.6 % — ABNORMAL LOW (ref 36.0–46.0)
Hemoglobin: 9.4 g/dL — ABNORMAL LOW (ref 12.0–15.0)
MCH: 29.2 pg (ref 26.0–34.0)
MCHC: 31.8 g/dL (ref 30.0–36.0)
MCV: 91.9 fL (ref 78.0–100.0)
Platelets: 167 K/uL (ref 150–400)
RBC: 3.22 MIL/uL — ABNORMAL LOW (ref 3.87–5.11)
RDW: 16.4 % — ABNORMAL HIGH (ref 11.5–15.5)
WBC: 6 10*3/uL (ref 4.0–10.5)

## 2011-05-27 LAB — COMPREHENSIVE METABOLIC PANEL WITH GFR
ALT: 52 U/L — ABNORMAL HIGH (ref 0–35)
BUN: 19 mg/dL (ref 6–23)
Calcium: 7.9 mg/dL — ABNORMAL LOW (ref 8.4–10.5)
GFR calc Af Amer: 57 mL/min — ABNORMAL LOW (ref >90)
Glucose, Bld: 139 mg/dL — ABNORMAL HIGH (ref 70–99)
Sodium: 140 meq/L (ref 135–145)
Total Protein: 6 g/dL (ref 6.0–8.3)

## 2011-05-27 LAB — PRO B NATRIURETIC PEPTIDE: Pro B Natriuretic peptide (BNP): 7596 pg/mL — ABNORMAL HIGH (ref 0–450)

## 2011-05-27 LAB — PROTIME-INR
INR: 1.27 (ref 0.00–1.49)
Prothrombin Time: 16.2 seconds — ABNORMAL HIGH (ref 11.6–15.2)

## 2011-05-27 LAB — LIPASE, BLOOD: Lipase: 15 U/L (ref 11–59)

## 2011-05-27 MED ORDER — KETOROLAC TROMETHAMINE 15 MG/ML IJ SOLN
15.0000 mg | Freq: Once | INTRAMUSCULAR | Status: DC
  Filled 2011-05-27: qty 1

## 2011-05-27 MED ORDER — ASPIRIN 81 MG PO CHEW
162.0000 mg | CHEWABLE_TABLET | Freq: Once | ORAL | Status: AC
  Administered 2011-05-27: 162 mg via ORAL
  Filled 2011-05-27: qty 1

## 2011-05-27 MED ORDER — NITROGLYCERIN 0.4 MG SL SUBL
0.4000 mg | SUBLINGUAL_TABLET | SUBLINGUAL | Status: DC | PRN
  Administered 2011-05-27: 0.4 mg via SUBLINGUAL
  Filled 2011-05-27: qty 25

## 2011-05-27 MED ORDER — IOHEXOL 300 MG/ML  SOLN
100.0000 mL | Freq: Once | INTRAMUSCULAR | Status: AC | PRN
  Administered 2011-05-27: 80 mL via INTRAVENOUS

## 2011-05-27 MED ORDER — ALBUTEROL SULFATE (5 MG/ML) 0.5% IN NEBU
5.0000 mg | INHALATION_SOLUTION | Freq: Once | RESPIRATORY_TRACT | Status: AC
  Administered 2011-05-27: 5 mg via RESPIRATORY_TRACT
  Filled 2011-05-27: qty 1

## 2011-05-27 MED ORDER — SODIUM CHLORIDE 0.9 % IV SOLN
Freq: Once | INTRAVENOUS | Status: AC
  Administered 2011-05-27: 19:00:00 via INTRAVENOUS

## 2011-05-27 MED ORDER — POTASSIUM CHLORIDE CRYS ER 20 MEQ PO TBCR
20.0000 meq | EXTENDED_RELEASE_TABLET | Freq: Once | ORAL | Status: AC
  Administered 2011-05-27: 20 meq via ORAL
  Filled 2011-05-27: qty 1

## 2011-05-27 MED ORDER — FENTANYL CITRATE 0.05 MG/ML IJ SOLN
25.0000 ug | Freq: Once | INTRAMUSCULAR | Status: AC
  Administered 2011-05-27: 25 ug via INTRAVENOUS
  Filled 2011-05-27: qty 2

## 2011-05-27 MED ORDER — KETOROLAC TROMETHAMINE 30 MG/ML IJ SOLN
INTRAMUSCULAR | Status: AC
  Administered 2011-05-27: 15 mg
  Filled 2011-05-27: qty 1

## 2011-05-27 MED ORDER — IPRATROPIUM BROMIDE 0.02 % IN SOLN
0.5000 mg | Freq: Once | RESPIRATORY_TRACT | Status: AC
Start: 2011-05-27 — End: 2011-05-27
  Administered 2011-05-27: 0.5 mg via RESPIRATORY_TRACT
  Filled 2011-05-27: qty 2.5

## 2011-05-27 MED ORDER — FUROSEMIDE 10 MG/ML IJ SOLN
40.0000 mg | Freq: Once | INTRAMUSCULAR | Status: AC
  Administered 2011-05-27: 40 mg via INTRAVENOUS
  Filled 2011-05-27: qty 4

## 2011-05-27 NOTE — ED Notes (Signed)
[facility] called  

## 2011-05-27 NOTE — ED Notes (Signed)
ZOX:WRUE<AV> Expected date:05/27/11<BR> Expected time: 2:13 PM<BR> Means of arrival:Ambulance<BR> Comments:<BR> M1. 78 yo f. From [facility]. SOB, diminshed lungs. 10 mins

## 2011-05-27 NOTE — Discharge Instructions (Signed)
 Heart Failure Heart failure (HF) is a condition in which the heart has trouble pumping blood. This means your heart does not pump blood efficiently for your body to work well. In some cases of HF, fluid may back up into your lungs or you may have swelling (edema) in your lower legs. HF is a long-term (chronic) condition. It is important for you to take good care of yourself and follow your caregiver's treatment plan. CAUSES   Health conditions:   High blood pressure (hypertension) causes the heart muscle to work harder than normal. When pressure in the blood vessels is high, the heart needs to pump (contract) with more force in order to circulate blood throughout the body. High blood pressure eventually causes the heart to become stiff and weak.   Coronary artery disease (CAD) is the buildup of cholesterol and fat (plaques) in the arteries of the heart. The blockage in the arteries deprives the heart muscle of oxygen and blood. This can cause chest pain and may lead to a heart attack. High blood pressure can also contribute to CAD.   Heart attack (myocardial infarction) occurs when 1 or more arteries in the heart become blocked. The loss of oxygen damages the muscle tissue of the heart. When this happens, part of the heart muscle dies. The injured tissue does not contract as well and weakens the heart's ability to pump blood.   Abnormal heart valves can cause HF when the heart valves do not open and close properly. This makes the heart muscle pump harder to keep the blood flowing.   Heart muscle disease (cardiomyopathy or myocarditis) is damage to the heart muscle from a variety of causes. These can include drug or alcohol abuse, infections, or unknown reasons. These can increase the risk of HF.   Lung disease makes the heart work harder because the lungs do not work properly. This can cause a strain on the heart leading it to fail.   Diabetes increases the risk of HF. High blood sugar contributes  to high fat (lipid) levels in the blood. Diabetes can also cause slow damage to tiny blood vessels that carry important nutrients to the heart muscle. When the heart does not get enough oxygen and food, it can cause the heart to become weak and stiff. This leads to a heart that does not contract efficiently.   Other diseases can contribute to HF. These include abnormal heart rhythms, thyroid problems, and low blood counts (anemia).   Unhealthy lifestyle habits:   Obesity.   Smoking.   Eating foods high in fat and cholesterol.   Eating or drinking beverages high in salt.   Drug or alcohol abuse.   Lack of exercise.  SYMPTOMS  HF symptoms may vary and can be hard to detect. Symptoms may include:  Shortness of breath with activity, such as climbing stairs.   Persistent cough.   Swelling of the feet, ankles, legs, or abdomen.   Unexplained weight gain.   Difficulty breathing when lying flat.   Waking from sleep because of the need to sit up and get more air.   Rapid heartbeat.   Fatigue and loss of energy.   Feeling lightheaded or close to fainting.  DIAGNOSIS  A diagnosis of HF is based on your history, symptoms, physical examination, and diagnostic tests. Diagnostic tests for HF may include:  EKG.   Chest X-ray.   Blood tests.   Exercise stress test.   Blood oxygen test (arterial blood gas).   Evaluation  by a heart doctor (cardiologist).   Ultrasound evaluation of the heart (echocardiogram).   Heart artery test to look for blockages (angiogram).   Radioactive imaging to look at the heart (radionuclide test).  TREATMENT  Treatment is aimed at managing the symptoms of HF. Medicines, lifestyle changes, or surgical intervention may be necessary to treat HF.  Medicines to help treat HF may include:   Angiotensin-converting enzyme (ACE) inhibitors. These block the effects of a blood protein called angiotensin-converting enzyme. ACE inhibitors relax (dilate) the  blood vessels and help lower blood pressure. This decreases the workload of the heart, slows the progression of HF, and improves symptoms.   Angiotensin receptor blockers (ARBs). These medications work similar to ACE inhibitors. ARBs may be an alternative for people who cannot tolerate an ACE inhibitor.   Aldosterone antagonists. This medication helps get rid of extra fluid from your body. This lowers the volume of blood the heart has to pump.   Water pills (diuretics). Diuretics cause the kidneys to remove salt and water from the blood. The extra fluid is removed by urination. By removing extra fluid from the body, diuretics help lower the workload of the heart and help prevent fluid buildup in the lungs so breathing is easier.   Beta blockers. These prevent the heart from beating too fast and improve heart muscle strength. Beta blockers help maintain a normal heart rate, control blood pressure, and improve HF symptoms.   Digitalis. This increases the force of the heartbeat and may be helpful to people with HF or heart rhythm problems.   Healthy lifestyle changes include:   Stopping smoking.   Eating a healthy diet. Avoid foods high in fat. Avoid foods fried in oil or made with fat. A dietician can help with healthy food choices.   Limiting how much salt you eat.   Limiting alcohol intake to no more than 1 drink per day for women and 2 drinks per day for men. Drinking more than that is harmful to your heart. If your heart has already been damaged by alcohol or you have severe HF, drinking alcohol should be stopped completely.   Exercising as directed by your caregiver.   Surgical treatment for HF may include:   Procedures to open blocked arteries, repair damaged heart valves, or remove damaged heart muscle tissue.   A pacemaker to help heart muscle function and to control certain abnormal heart rhythms.   A defibrillator to possibly prevent sudden cardiac death.  HOME CARE  INSTRUCTIONS   Activity level. Your caregiver can help you determine what type of exercise program may be helpful. It is important to maintain your strength. Pace your physical activity to avoid shortness of breath or chest pain. Rest for 1 hour before and after meals. A cardiac rehabilitation program may be helpful to some people with HF.   Diet. Eat a heart healthy diet. Food choices should be low in saturated fat and cholesterol. Talk to a dietician to learn about heart healthy foods.   Salt intake. When you have HF, you need to limit the amount of salt you eat. Eat less than 1500 milligrams (mg) of salt per day or as recommended by your caregiver.   Weight monitoring. Weigh yourself every day. You should weigh yourself in the morning after you urinate and before you eat breakfast. Wear the same amount of clothing each time you weigh yourself. Record your weight daily. Bring your recorded weights to your clinic visits. Tell your caregiver right away if  you have gained 3 lb/1.4 kg in 1 day, or 5 lb/2.3 kg in a week or whatever amount you were told to report.   Blood pressure monitoring. This should be done as directed by your caregiver. A home blood pressure cuff can be purchased at a drugstore. Record your blood pressure numbers and bring them to your clinic visits. Tell your caregiver if you become dizzy or lightheaded upon standing up.   Smoking. If you are currently a smoker, it is time to quit. Nicotine makes your heart work harder by causing your blood vessels to constrict. Do not use nicotine gum or patches before talking to your caregiver.   Follow up. Be sure to schedule a follow-up visit with your caregiver. Keep all your appointments.  SEEK MEDICAL CARE IF:   Your weight increases by 3 lb/1.4 kg in 1 day or 5 lb/2.3 kg in a week.   You notice increasing shortness of breath that is unusual for you. This may happen during rest, sleep, or with activity.   You cough more than normal,  especially with physical activity.   You notice more swelling in your hands, feet, ankles, or belly (abdomen).   You are unable to sleep because it is hard to breathe.   You cough up bloody mucus (sputum).   You begin to feel jumping or fluttering sensations (palpitations) in your chest.  SEEK IMMEDIATE MEDICAL CARE IF:   You have severe chest pain or pressure which may include symptoms such as:   Pain or pressure in the arms, neck, jaw, or back.   Feeling sweaty.   Feeling sick to your stomach (nauseous).   Feeling short of breath while at rest.   Having a fast or irregular heartbeat.   You experience stroke symptoms. These symptoms include:   Facial weakness or numbness.   Weakness or numbness in an arm, leg, or on one side of your body.   Blurred vision.   Difficulty talking or thinking.   Dizziness or fainting.   Severe headache.  THESE ARE MEDICAL EMERGENCIES. Do not wait to see if the symptoms go away. Call your local emergency services (911 in [place] DO NOT drive yourself to the hospital. IMPORTANT  Make a list of every medicine, vitamin, or herbal supplement you are taking. Keep the list with you at all times. Show it to your caregiver at every visit. Keep the list up-to-date.   Ask your caregiver or pharmacist to write an explanation of each medicine you are taking. This should include:   Why you are taking it.   The possible side effects.   The best time of day to take it.   Foods to take with it or what foods to avoid.   When to stop taking it.  MAKE SURE YOU:   Understand these instructions.   Will watch your condition.   Will get help right away if you are not doing well or get worse.  Document Released: 03/18/2005 Document Revised: 11/28/2010 Document Reviewed: 06/30/2009 [facility] Patient Information 2012 [place], [place].      You had a chest xray which showed congestive heart failure.  You also had a CT of your chest which showed  small effusions, but not pneumonia and no blood clots.  The edema had improved following administration of IV lasix  or furosemide . You had 2 sets of troponin blood tests which were also normal.  Continue your other current medications and continue to use your oxygen. Please have Dr. Coplan re-evaluate  you in 1-2 days for a close recheck. A repeat blood panel to check your kidney function may be of benefit as well.

## 2011-05-27 NOTE — ED Provider Notes (Signed)
History     CSN: [id number]  Arrival date & time 05/27/11  1424   First MD Initiated Contact with Patient 05/27/11 1508      Chief Complaint  Patient presents with  . Shortness of Breath    (Consider location/radiation/quality/duration/timing/severity/associated sxs/prior treatment) HPI Comments: Level 5 caveat due to dementia and poor memory.  Staff at [facility] reports pt has been on abx since 2/22 for pneumonia.  Today, NP evaluated her and pt complained of back pain on left side, pt reports not much coughing, but when it happens, it is severe and she reports right side "lung pain" to me.  Pain seems to be new, and pt's SOB is not improving despite abx over the weekend so sent to the ED for evaluation.  It seems due to these concerns, admission is preferred by the facility.  Pt reports feeling somewhat improved here upon my eval.  Pt received a neb treatment per EMS PTA.  Pt has a h/o CHF and COPD as well as a fib.  Pt is no longer on coumadin.  Pt is a DNR.  Pt denies N/V/D.  No dysuria, rash, itching.  She reports poor appetite, but that is normal for her.    The history is provided by the patient, medical records, the nursing home and the EMS personnel.    Past Medical History  Diagnosis Date  . Diabetes mellitus   . Diverticulosis   . Osteopenia   . PUD (peptic ulcer disease)   . Peripheral arterial disease   . Hypertension   . Hyperlipidemia   . Diastolic heart failure     a. echo 9/11: EF 60-65%, mild LVH, grade 2 diast dysfxn, AV mean gradient 9 mmHg, mild MR, RVE, RVSP 59  . Coronary artery disease     a. s/p CABG;  b. cath 8/09: LM occluded; RCA 50%; L-LAD ok, Radial-OM ok; bilat renal art stenosis  . Renovascular hypertension   . Renal artery stenosis     a. s/p L RA stent 8/09  . Paroxysmal a-fib     no coumadin 2/2 GI Blee  . DVT (deep venous thrombosis)   . Pulmonary embolus     s/p IVC filter  . History of gastrointestinal bleeding     no ASA or coumadin    . Contrast dye induced nephropathy   . Orthostatic hypotension   . CHF (congestive heart failure)   . COPD (chronic obstructive pulmonary disease)   . Mesenteric ischemia, chronic     Past Surgical History  Procedure Date  . Appendectomy   . Cholecystectomy   . Abdominal hysterectomy   . Coronary artery bypass graft 09-04-2001    carotid u/s unchnaged 40-50% ICA stenosis 12/1998  . Cataract extraction, bilateral 2001  . Abdominal aortic aneurysm repair 1997  . Nm myoview ltd     neg EF 76%--06/26/2004  . Cardiac catheterization 09-03-01  . Esophagogastroduodenoscopy     abnormal 2007, esophageal stricture 10-12-03-07 gastritis and major bleef  . Vena cava filter placement 07-2007    Dr. Excell Seltzer  . Renal artery doppler 11-17-2008    severe iliac stenosis    Family History  Problem Relation Age of Onset  . Cancer Mother   . Stroke Father   . Stroke Brother   . Heart attack Brother   . Heart attack Brother   . Heart attack Brother     History  Substance Use Topics  . Smoking status: Former Smoker -- 1.0 packs/day  for 40 years    Types: Cigarettes    Quit date: 04/01/1990  . Smokeless tobacco: Not on file  . Alcohol Use: No    OB History    Grav Para Term Preterm Abortions TAB SAB Ect Mult Living                  Review of Systems  Unable to perform ROS: Dementia    Allergies  Meperidine hcl; Niacin; Penicillins; Sulfonamide derivatives; Codeine; Morphine; and Propoxyphene hcl  Home Medications   Current Outpatient Rx  Name Route Sig Dispense Refill  . ACETAMINOPHEN 500 MG PO TABS Oral Take 1,000 mg by mouth every 4 (four) hours as needed. Pain.    . ALBUTEROL SULFATE (2.5 MG/3ML) 0.083% IN NEBU Nebulization Take 2.5 mg by nebulization every 6 (six) hours as needed. Dyspnea.    . AMLODIPINE BESYLATE 10 MG PO TABS Oral Take 5 mg by mouth daily.    Marland Kitchen CALCIUM CARBONATE ANTACID 500 MG PO CHEW Oral Chew 1 tablet by mouth every 4 (four) hours as needed. indigestion     . ISOSORBIDE MONONITRATE ER 30 MG PO TB24 Oral Take 1 tablet (30 mg total) by mouth daily. 30 tablet 11  . KLOR-CON 10 10 MEQ PO TBCR Oral Take 1 tablet by mouth daily.     Marland Kitchen LATANOPROST 0.005 % OP SOLN Both Eyes Place 1 drop into both eyes at bedtime.     Marland Kitchen LEVOFLOXACIN 500 MG PO TABS Oral Take 500 mg by mouth daily. For 7 days for PNA.    Marland Kitchen METOLAZONE 5 MG PO TABS Oral Take 2.5 mg by mouth 4 (four) times a week. Monday, Wednesday, Friday, and Saturdays     . METOPROLOL TARTRATE 25 MG PO TABS Oral Take 25 mg by mouth 2 (two) times daily.    Marland Kitchen MIRTAZAPINE 15 MG PO TABS  TAKE 1 TABLET BY MOUTH AT BEDTIME 30 tablet 5  . NITROGLYCERIN 0.4 MG SL SUBL Sublingual Place 0.4 mg under the tongue every 5 (five) minutes as needed. May repeat for 2 doses as needed for chest pain.    Marland Kitchen OMEPRAZOLE 40 MG PO CPDR Oral Take 40 mg by mouth daily. For GI bleed.    . OXYCODONE HCL ER 10 MG PO TB12 Oral Take 10 mg by mouth every 4 (four) hours as needed. Pain.    . OXYCODONE HCL 20 MG/ML PO CONC Oral Take 10 mg by mouth every 4 (four) hours as needed. Pain,dyspnea, shortness of breath.    Marland Kitchen PREDNISONE 10 MG PO TABS Oral Take by mouth daily.    Marland Kitchen PROMETHAZINE HCL 12.5 MG PO TABS  Take one tablet by mouth every six hours as needed for nausea, ok to crush 50 tablet 2  . SACCHAROMYCES BOULARDII 250 MG PO CAPS Oral Take 250 mg by mouth 2 (two) times daily. For 10 days.    Marland Kitchen SIMVASTATIN 20 MG PO TABS Oral Take 20 mg by mouth every evening.    . TORSEMIDE 20 MG PO TABS Oral Take 20 mg by mouth daily.      BP 140/44  Pulse 82  Temp(Src) 97.7 F (36.5 C) (Oral)  Resp 18  SpO2 97%  Physical Exam  Nursing note and vitals reviewed. Constitutional: She appears well-developed and well-nourished.  Eyes: Pupils are equal, round, and reactive to light. No scleral icterus.  Neck: Normal range of motion. Neck supple. No thyromegaly present.  Cardiovascular: Normal rate, S1 normal, S2 normal and  intact distal pulses.  An  irregular rhythm present.  No murmur heard. Pulmonary/Chest: Tachypnea noted. No apnea. No respiratory distress. She has no wheezes. She has rales in the left lower field.  Abdominal: Soft. She exhibits no distension. There is no tenderness. There is no CVA tenderness.    ED Course  Procedures (including critical care time)  Labs Reviewed  CBC - Abnormal; Notable for the following:    RBC 3.22 (*)    Hemoglobin 9.4 (*)    HCT 29.6 (*)    RDW 16.4 (*)    All other components within normal limits  DIFFERENTIAL - Abnormal; Notable for the following:    Neutrophils Relative 92 (*)    Lymphocytes Relative 6 (*)    Lymphs Abs 0.4 (*)    Monocytes Relative 1 (*)    All other components within normal limits  COMPREHENSIVE METABOLIC PANEL - Abnormal; Notable for the following:    Potassium 3.1 (*)    Chloride 92 (*)    CO2 37 (*)    Glucose, Bld 139 (*)    Calcium 7.9 (*)    Albumin 2.7 (*)    AST 49 (*)    ALT 52 (*)    Alkaline Phosphatase 179 (*)    GFR calc non Af Amer 49 (*)    GFR calc Af Amer 57 (*)    All other components within normal limits  PROTIME-INR - Abnormal; Notable for the following:    Prothrombin Time 16.2 (*)    All other components within normal limits  PRO B NATRIURETIC PEPTIDE - Abnormal; Notable for the following:    Pro B Natriuretic peptide (BNP) 7596.0 (*)    All other components within normal limits  LIPASE, BLOOD  TROPONIN I  APTT  TROPONIN I   Ct Angio Chest W/cm &/or Wo Cm  05/27/2011  *RADIOLOGY REPORT*  Clinical Data: Shortness of breath, back   pain, pleurisy.  History DVT.  CT ANGIOGRAPHY CHEST  Technique:  Multidetector CT imaging of the chest using the standard protocol during bolus administration of intravenous contrast. Multiplanar reconstructed images including MIPs were obtained and reviewed to evaluate the vascular anatomy.  Contrast: 80mL OMNIPAQUE IOHEXOL 300 MG/ML IV SOLN  Comparison: 09/07/2010  Findings: There is good contrast  opacification of the pulmonary artery branches.  No discrete filling defect to suggest acute PE.Previous CABG. Good contrast opacification of the thoracic aorta with no evidence of dissection, aneurysm, or stenosis. There is classic 3-vessel brachiocephalic arch anatomy with calcified plaque at the origins.  Small left and moderate right pleural effusions.  There is biatrial cardiac enlargement.  Sub centimeter anterior mediastinal, prevascular, AP window, pretracheal, precarinal lymph nodes.  There is dependent atelectasis through all basilar segments of the right lower lobe and the dependent aspect of the left lower lobe.  There is reflux of contrast from the right atrium into the hepatic veins.  Remainder of visualized upper abdomen unremarkable. Compression deformities of T5 and T8 are stable.  IMPRESSION:  1.  Negative for acute PE or thoracic aortic dissection. 2.  Moderate right and small left pleural effusions with dependent atelectasis in the lower lobes.  Original Report Authenticated By: Osa Craver, M.D.   Dg Chest Portable 1 View  05/27/2011  *RADIOLOGY REPORT*  Clinical Data: Short of breath.  Back pain.  PORTABLE CHEST - 1 VIEW  Comparison: 02/08/2011  Findings: Artifact overlies chest.  There is been median sternotomy and CABG.  The heart is  enlarged.  There is venous hypertension with interstitial and early alveolar edema.  There are effusions with bilateral lower lobe volume loss.  IMPRESSION: Congestive heart failure.  Pulmonary edema with effusions and basilar volume loss.  Original Report Authenticated By: Thomasenia Sales, M.D.     1. CHF (congestive heart failure)   2. Back pain     Saturation is 98% which is likely to be normal.  ECG at time 14:38 shows atrial fib at rate 96, Q waves in V1-V2, repol abn in lateral leads.  No sig change from ECG on Feb 09, 2011.   MDM   Pt reportedly with pneumonia diagnosed about 4 days ago, treatment failure with worsening SOB and  chest and back discomfort which is new.  No prior h/o CAD that is found in review of prior records.  Pt has a h/o DVT and is no longer on coumadin it seems.  Will need labs, troponin, CXR and will consider CT Scan after checking renal function if her INR is normal.  Will give neb treatment as well.       6:19 PM CXR and BNP suggests CHF, no fever, WBC is not elevated, going against pneumonia.  Pt had left upper back pain for facility, here compalins of right side upper back pain.  Will give ASA and NTG as well for possibly CAD.  Will need rule out and treatment of CHF in my opinion.  Pt's facility also is concerned that her symptoms are worsening rather than improving.    8:40 PM Pulse ox and vital signs have remained stable.  Pt remains in a stable atrial fib.  Chest CT shows no pneumonia, no PE.  If second troponin is neg, will d/c back to facility where she can be continued to be cared by primary NP.    Gavin Pound. Oletta Lamas, MD 05/27/11 2127

## 2011-05-27 NOTE — ED Notes (Signed)
Per EMS- Patient is a resident of [facility]. ECF called after patient did not have improvement from albuterol/Atroven t neb given earlier. EMS repeated neb and gave Solumedrol 125 mg IV

## 2011-05-31 MED FILL — Methylprednisolone Sod Succ For Inj 125 MG (Base Equiv): INTRAMUSCULAR | Qty: 1 | Status: AC

## 2011-06-18 ENCOUNTER — Other Ambulatory Visit: Payer: Self-pay | Admitting: *Deleted

## 2011-06-18 MED ORDER — METOPROLOL TARTRATE 25 MG PO TABS: 25.0000 mg | ORAL_TABLET | Freq: Two times a day (BID) | ORAL | Status: DC

## 2011-06-18 MED ORDER — METOLAZONE 5 MG PO TABS: 2.5000 mg | ORAL_TABLET | ORAL | Status: DC

## 2011-06-18 MED ORDER — SIMVASTATIN 20 MG PO TABS: 20.0000 mg | ORAL_TABLET | Freq: Every evening | ORAL | Status: DC

## 2011-06-18 MED ORDER — POTASSIUM CHLORIDE ER 10 MEQ PO TBCR: 10.0000 meq | EXTENDED_RELEASE_TABLET | Freq: Every day | ORAL | Status: DC

## 2011-06-18 MED ORDER — ISOSORBIDE MONONITRATE ER 30 MG PO TB24: 30.0000 mg | ORAL_TABLET | Freq: Every day | ORAL | Status: DC

## 2011-06-18 MED ORDER — MIRTAZAPINE 15 MG PO TABS: 15.0000 mg | ORAL_TABLET | Freq: Every day | ORAL | Status: DC

## 2011-06-19 ENCOUNTER — Ambulatory Visit: Payer: Medicare Other | Admitting: Cardiovascular Disease

## 2011-06-21 ENCOUNTER — Emergency Department (HOSPITAL_COMMUNITY)

## 2011-06-21 ENCOUNTER — Inpatient Hospital Stay (HOSPITAL_COMMUNITY)
Admission: EM | Admit: 2011-06-21 | Discharge: 2011-06-25 | DRG: 291 | Disposition: A | Discharge status: Skilled Nursing Facility | Source: Ambulatory Visit | Attending: Internal Medicine | Admitting: Internal Medicine

## 2011-06-21 ENCOUNTER — Encounter (HOSPITAL_COMMUNITY): Payer: Self-pay | Admitting: Emergency Medicine

## 2011-06-21 ENCOUNTER — Telehealth: Payer: Self-pay | Admitting: Family Medicine

## 2011-06-21 ENCOUNTER — Other Ambulatory Visit: Payer: Self-pay

## 2011-06-21 DIAGNOSIS — N184 Chronic kidney disease, stage 4 (severe): Secondary | ICD-10-CM | POA: Diagnosis present

## 2011-06-21 DIAGNOSIS — K5730 Diverticulosis of large intestine without perforation or abscess without bleeding: Secondary | ICD-10-CM | POA: Diagnosis present

## 2011-06-21 DIAGNOSIS — E785 Hyperlipidemia, unspecified: Secondary | ICD-10-CM | POA: Diagnosis present

## 2011-06-21 DIAGNOSIS — I2510 Atherosclerotic heart disease of native coronary artery without angina pectoris: Secondary | ICD-10-CM | POA: Diagnosis present

## 2011-06-21 DIAGNOSIS — Z86718 Personal history of other venous thrombosis and embolism: Secondary | ICD-10-CM

## 2011-06-21 DIAGNOSIS — E119 Type 2 diabetes mellitus without complications: Secondary | ICD-10-CM | POA: Diagnosis present

## 2011-06-21 DIAGNOSIS — F329 Major depressive disorder, single episode, unspecified: Secondary | ICD-10-CM | POA: Diagnosis present

## 2011-06-21 DIAGNOSIS — R634 Abnormal weight loss: Secondary | ICD-10-CM | POA: Diagnosis present

## 2011-06-21 DIAGNOSIS — Z8711 Personal history of peptic ulcer disease: Secondary | ICD-10-CM

## 2011-06-21 DIAGNOSIS — I5033 Acute on chronic diastolic (congestive) heart failure: Principal | ICD-10-CM | POA: Diagnosis present

## 2011-06-21 DIAGNOSIS — I509 Heart failure, unspecified: Secondary | ICD-10-CM | POA: Diagnosis present

## 2011-06-21 DIAGNOSIS — I252 Old myocardial infarction: Secondary | ICD-10-CM

## 2011-06-21 DIAGNOSIS — M899 Disorder of bone, unspecified: Secondary | ICD-10-CM | POA: Diagnosis present

## 2011-06-21 DIAGNOSIS — I10 Essential (primary) hypertension: Secondary | ICD-10-CM | POA: Diagnosis present

## 2011-06-21 DIAGNOSIS — K551 Chronic vascular disorders of intestine: Secondary | ICD-10-CM | POA: Diagnosis present

## 2011-06-21 DIAGNOSIS — I150 Renovascular hypertension: Secondary | ICD-10-CM | POA: Diagnosis present

## 2011-06-21 DIAGNOSIS — M949 Disorder of cartilage, unspecified: Secondary | ICD-10-CM | POA: Diagnosis present

## 2011-06-21 DIAGNOSIS — M129 Arthropathy, unspecified: Secondary | ICD-10-CM | POA: Diagnosis present

## 2011-06-21 DIAGNOSIS — F411 Generalized anxiety disorder: Secondary | ICD-10-CM | POA: Diagnosis present

## 2011-06-21 DIAGNOSIS — Z86711 Personal history of pulmonary embolism: Secondary | ICD-10-CM

## 2011-06-21 DIAGNOSIS — I739 Peripheral vascular disease, unspecified: Secondary | ICD-10-CM | POA: Diagnosis present

## 2011-06-21 DIAGNOSIS — I4891 Unspecified atrial fibrillation: Secondary | ICD-10-CM | POA: Diagnosis present

## 2011-06-21 DIAGNOSIS — Z66 Do not resuscitate: Secondary | ICD-10-CM | POA: Diagnosis present

## 2011-06-21 DIAGNOSIS — J441 Chronic obstructive pulmonary disease with (acute) exacerbation: Secondary | ICD-10-CM | POA: Diagnosis present

## 2011-06-21 DIAGNOSIS — E876 Hypokalemia: Secondary | ICD-10-CM | POA: Diagnosis present

## 2011-06-21 DIAGNOSIS — Z951 Presence of aortocoronary bypass graft: Secondary | ICD-10-CM

## 2011-06-21 DIAGNOSIS — J189 Pneumonia, unspecified organism: Secondary | ICD-10-CM | POA: Diagnosis present

## 2011-06-21 DIAGNOSIS — IMO0002 Reserved for concepts with insufficient information to code with codable children: Secondary | ICD-10-CM | POA: Diagnosis present

## 2011-06-21 DIAGNOSIS — F3289 Other specified depressive episodes: Secondary | ICD-10-CM | POA: Diagnosis present

## 2011-06-21 HISTORY — DX: Headache: R51

## 2011-06-21 HISTORY — DX: Encounter for other specified aftercare: Z51.89

## 2011-06-21 HISTORY — DX: Acute myocardial infarction, unspecified: I21.9

## 2011-06-21 HISTORY — DX: Anxiety disorder, unspecified: F41.9

## 2011-06-21 HISTORY — DX: Mental disorder, not otherwise specified: F99

## 2011-06-21 HISTORY — DX: Angina pectoris, unspecified: I20.9

## 2011-06-21 HISTORY — DX: Pneumonia, unspecified organism: J18.9

## 2011-06-21 HISTORY — DX: Anemia, unspecified: D64.9

## 2011-06-21 HISTORY — DX: Depression, unspecified: F32.A

## 2011-06-21 HISTORY — DX: Nausea with vomiting, unspecified: R11.2

## 2011-06-21 HISTORY — DX: Acute upper respiratory infection, unspecified: J06.9

## 2011-06-21 HISTORY — DX: Unspecified osteoarthritis, unspecified site: M19.90

## 2011-06-21 HISTORY — DX: Shortness of breath: R06.02

## 2011-06-21 HISTORY — DX: Cardiac arrhythmia, unspecified: I49.9

## 2011-06-21 HISTORY — DX: Reserved for inherently not codable concepts without codable children: IMO0001

## 2011-06-21 HISTORY — DX: Cardiac murmur, unspecified: R01.1

## 2011-06-21 HISTORY — DX: Major depressive disorder, single episode, unspecified: F32.9

## 2011-06-21 HISTORY — DX: Other specified postprocedural states: Z98.890

## 2011-06-21 HISTORY — DX: Personal history of other diseases of the digestive system: Z87.19

## 2011-06-21 LAB — DIFFERENTIAL
Basophils Relative: 0 % (ref 0–1)
Eosinophils Absolute: 0.1 10*3/uL (ref 0.0–0.7)
Lymphs Abs: 0.7 10*3/uL (ref 0.7–4.0)
Monocytes Absolute: 0.4 10*3/uL (ref 0.1–1.0)
Monocytes Relative: 6 % (ref 3–12)
Neutrophils Relative %: 84 % — ABNORMAL HIGH (ref 43–77)

## 2011-06-21 LAB — URINALYSIS, ROUTINE W REFLEX MICROSCOPIC
Ketones, ur: NEGATIVE mg/dL
Nitrite: NEGATIVE
Protein, ur: NEGATIVE mg/dL

## 2011-06-21 LAB — CBC
HCT: 30.3 % — ABNORMAL LOW (ref 36.0–46.0)
HCT: 32.3 % — ABNORMAL LOW (ref 36.0–46.0)
Hemoglobin: 10.4 g/dL — ABNORMAL LOW (ref 12.0–15.0)
Hemoglobin: 9.7 g/dL — ABNORMAL LOW (ref 12.0–15.0)
MCH: 29.1 pg (ref 26.0–34.0)
MCHC: 32 g/dL (ref 30.0–36.0)
RBC: 3.33 MIL/uL — ABNORMAL LOW (ref 3.87–5.11)
RBC: 3.51 MIL/uL — ABNORMAL LOW (ref 3.87–5.11)
RDW: 15 % (ref 11.5–15.5)
WBC: 9.2 10*3/uL (ref 4.0–10.5)

## 2011-06-21 LAB — COMPREHENSIVE METABOLIC PANEL
Alkaline Phosphatase: 115 U/L (ref 39–117)
BUN: 27 mg/dL — ABNORMAL HIGH (ref 6–23)
Chloride: 88 mEq/L — ABNORMAL LOW (ref 96–112)
Creatinine, Ser: 0.84 mg/dL (ref 0.50–1.10)
GFR calc Af Amer: 76 mL/min — ABNORMAL LOW (ref >90)
Glucose, Bld: 107 mg/dL — ABNORMAL HIGH (ref 70–99)
Potassium: 2.6 mEq/L — CL (ref 3.5–5.1)
Total Bilirubin: 0.6 mg/dL (ref 0.3–1.2)
Total Protein: 5.7 g/dL — ABNORMAL LOW (ref 6.0–8.3)

## 2011-06-21 LAB — LACTIC ACID, PLASMA: Lactic Acid, Venous: 0.8 mmol/L (ref 0.5–2.2)

## 2011-06-21 MED ORDER — PANTOPRAZOLE SODIUM 40 MG PO TBEC
40.0000 mg | DELAYED_RELEASE_TABLET | Freq: Every day | ORAL | Status: DC
  Administered 2011-06-22 – 2011-06-25 (×4): 40 mg via ORAL
  Filled 2011-06-21 (×4): qty 1

## 2011-06-21 MED ORDER — ALBUTEROL SULFATE HFA 108 (90 BASE) MCG/ACT IN AERS
2.0000 | INHALATION_SPRAY | Freq: Four times a day (QID) | RESPIRATORY_TRACT | Status: DC | PRN
  Administered 2011-06-23: 2 via RESPIRATORY_TRACT
  Filled 2011-06-21: qty 6.7

## 2011-06-21 MED ORDER — PREDNISONE 50 MG PO TABS
60.0000 mg | ORAL_TABLET | Freq: Once | ORAL | Status: AC
  Administered 2011-06-22: 60 mg via ORAL
  Filled 2011-06-21: qty 1

## 2011-06-21 MED ORDER — METOPROLOL TARTRATE 25 MG PO TABS
25.0000 mg | ORAL_TABLET | Freq: Two times a day (BID) | ORAL | Status: DC
  Administered 2011-06-22 – 2011-06-25 (×8): 25 mg via ORAL
  Filled 2011-06-21 (×9): qty 1

## 2011-06-21 MED ORDER — SODIUM CHLORIDE 0.9 % IV SOLN: 250.0000 mL | INTRAVENOUS | Status: DC | PRN

## 2011-06-21 MED ORDER — ENOXAPARIN SODIUM 40 MG/0.4ML ~~LOC~~ SOLN
40.0000 mg | SUBCUTANEOUS | Status: DC
  Administered 2011-06-22 – 2011-06-25 (×4): 40 mg via SUBCUTANEOUS
  Filled 2011-06-21 (×4): qty 0.4

## 2011-06-21 MED ORDER — ACETAMINOPHEN 325 MG PO TABS
650.0000 mg | ORAL_TABLET | ORAL | Status: DC | PRN
  Administered 2011-06-22 – 2011-06-24 (×3): 650 mg via ORAL
  Filled 2011-06-21 (×3): qty 2

## 2011-06-21 MED ORDER — POTASSIUM CHLORIDE CRYS ER 20 MEQ PO TBCR
40.0000 meq | EXTENDED_RELEASE_TABLET | Freq: Every day | ORAL | Status: DC
  Administered 2011-06-22 (×2): 40 meq via ORAL
  Filled 2011-06-21 (×2): qty 2

## 2011-06-21 MED ORDER — SODIUM CHLORIDE 0.9 % IJ SOLN: 3.0000 mL | INTRAMUSCULAR | Status: DC | PRN

## 2011-06-21 MED ORDER — MIRTAZAPINE 15 MG PO TABS
15.0000 mg | ORAL_TABLET | Freq: Every day | ORAL | Status: DC
  Administered 2011-06-22 – 2011-06-24 (×3): 15 mg via ORAL
  Filled 2011-06-21 (×4): qty 1

## 2011-06-21 MED ORDER — ONDANSETRON HCL 4 MG/2ML IJ SOLN: 4.0000 mg | Freq: Four times a day (QID) | INTRAMUSCULAR | Status: DC | PRN

## 2011-06-21 MED ORDER — ISOSORBIDE MONONITRATE ER 30 MG PO TB24
30.0000 mg | ORAL_TABLET | Freq: Every day | ORAL | Status: DC
  Administered 2011-06-22 – 2011-06-25 (×4): 30 mg via ORAL
  Filled 2011-06-21 (×5): qty 1

## 2011-06-21 MED ORDER — ACETAMINOPHEN 500 MG PO TABS: 1000.0000 mg | ORAL_TABLET | ORAL | Status: DC | PRN

## 2011-06-21 MED ORDER — POTASSIUM CHLORIDE CRYS ER 20 MEQ PO TBCR
40.0000 meq | EXTENDED_RELEASE_TABLET | Freq: Once | ORAL | Status: AC
  Administered 2011-06-21: 40 meq via ORAL
  Filled 2011-06-21: qty 2

## 2011-06-21 MED ORDER — TEMAZEPAM 7.5 MG PO CAPS: 7.5000 mg | ORAL_CAPSULE | Freq: Every evening | ORAL | Status: DC | PRN

## 2011-06-21 MED ORDER — METOLAZONE 2.5 MG PO TABS
2.5000 mg | ORAL_TABLET | Freq: Every day | ORAL | Status: DC
  Administered 2011-06-22 – 2011-06-23 (×3): 2.5 mg via ORAL
  Filled 2011-06-21 (×4): qty 1

## 2011-06-21 MED ORDER — ZOLPIDEM TARTRATE 5 MG PO TABS
5.0000 mg | ORAL_TABLET | Freq: Every evening | ORAL | Status: DC | PRN
  Filled 2011-06-21: qty 1

## 2011-06-21 MED ORDER — LATANOPROST 0.005 % OP SOLN
1.0000 [drp] | Freq: Every day | OPHTHALMIC | Status: DC
  Administered 2011-06-22 – 2011-06-24 (×3): 1 [drp] via OPHTHALMIC
  Filled 2011-06-21: qty 2.5

## 2011-06-21 MED ORDER — BUDESONIDE 0.25 MG/2ML IN SUSP
0.2500 mg | Freq: Two times a day (BID) | RESPIRATORY_TRACT | Status: DC
  Administered 2011-06-22 – 2011-06-25 (×6): 0.25 mg via RESPIRATORY_TRACT
  Filled 2011-06-21 (×9): qty 2

## 2011-06-21 MED ORDER — ALBUTEROL SULFATE (5 MG/ML) 0.5% IN NEBU
2.5000 mg | INHALATION_SOLUTION | Freq: Four times a day (QID) | RESPIRATORY_TRACT | Status: DC
  Administered 2011-06-22 – 2011-06-23 (×8): 2.5 mg via RESPIRATORY_TRACT
  Filled 2011-06-21 (×8): qty 0.5

## 2011-06-21 MED ORDER — LEVOFLOXACIN IN D5W 500 MG/100ML IV SOLN
500.0000 mg | INTRAVENOUS | Status: DC
  Filled 2011-06-21: qty 100

## 2011-06-21 MED ORDER — SODIUM CHLORIDE 0.9 % IJ SOLN
3.0000 mL | Freq: Two times a day (BID) | INTRAMUSCULAR | Status: DC
  Administered 2011-06-21 – 2011-06-25 (×8): 3 mL via INTRAVENOUS

## 2011-06-21 MED ORDER — SIMVASTATIN 20 MG PO TABS
20.0000 mg | ORAL_TABLET | Freq: Every evening | ORAL | Status: DC
  Administered 2011-06-22 – 2011-06-24 (×3): 20 mg via ORAL
  Filled 2011-06-21 (×4): qty 1

## 2011-06-21 MED ORDER — IPRATROPIUM BROMIDE 0.02 % IN SOLN
0.5000 mg | Freq: Four times a day (QID) | RESPIRATORY_TRACT | Status: DC
  Administered 2011-06-22 – 2011-06-25 (×15): 0.5 mg via RESPIRATORY_TRACT
  Filled 2011-06-21 (×14): qty 2.5

## 2011-06-21 MED ORDER — LEVOFLOXACIN IN D5W 750 MG/150ML IV SOLN
750.0000 mg | Freq: Every day | INTRAVENOUS | Status: DC
  Administered 2011-06-22 (×2): 750 mg via INTRAVENOUS
  Filled 2011-06-21 (×3): qty 150

## 2011-06-21 MED ORDER — FUROSEMIDE 10 MG/ML IJ SOLN
40.0000 mg | Freq: Two times a day (BID) | INTRAMUSCULAR | Status: DC
  Administered 2011-06-22 (×2): 40 mg via INTRAVENOUS
  Filled 2011-06-21 (×3): qty 4

## 2011-06-21 MED ORDER — NITROGLYCERIN 0.4 MG SL SUBL: 0.4000 mg | SUBLINGUAL_TABLET | SUBLINGUAL | Status: DC | PRN

## 2011-06-21 MED ORDER — LORAZEPAM 0.5 MG PO TABS
0.5000 mg | ORAL_TABLET | Freq: Four times a day (QID) | ORAL | Status: DC | PRN
  Administered 2011-06-23 – 2011-06-24 (×3): 0.5 mg via ORAL
  Filled 2011-06-21 (×3): qty 1

## 2011-06-21 MED ORDER — CITALOPRAM HYDROBROMIDE 10 MG PO TABS
10.0000 mg | ORAL_TABLET | Freq: Every day | ORAL | Status: DC
  Administered 2011-06-22 – 2011-06-25 (×4): 10 mg via ORAL
  Filled 2011-06-21 (×4): qty 1

## 2011-06-21 NOTE — Telephone Encounter (Signed)
Per Dr. Patsy Lager advised nurse to have continue meds as is from [facility] and he will review pt's chart when able.  Advised nurse that he will be MD of record for hospice.

## 2011-06-21 NOTE — ED Notes (Signed)
Informed 16 RN that pt has not received Levaquin because not up from pharmacy.

## 2011-06-21 NOTE — Telephone Encounter (Signed)
Amy with [facility] is visiting pts husband and Amy is concerned about [name] condition. Pt feet swollen 2-3+ new productive cough with yellow phlegm, pt feels weak and fever 101.9. Amy can barely hear air movement in lungs. Heart rate at rest is 121 and irreg. Pt is on 4 L of O2. Amy feels like pt should be evaluated at ER. I asked Dr Para March in Dr Brayton Layman absence and Dr Para March said pt should go to ER for eval. When I went back on phone to tell home care nurse she said pts daugher was on the other line and told Amy to call hospice for instructions. Amy appreciated our help.

## 2011-06-21 NOTE — ED Notes (Signed)
Admitting at bedside 

## 2011-06-21 NOTE — Telephone Encounter (Signed)
Hospice nurse called to report pt had fever, worsened LE edema.  Spoke with pt on phone.  She said her SOB was similar to baseline, maybe a bit worse. I told her I recommended she to the ED so she could be evaluated better in person rather than over the phone.  She initially said she preferred not to go to the ED but said "I'll go if you say that's what I should do".  I said I think she should go to ED, so she said she would do that.  (6:15pm)--PM

## 2011-06-21 NOTE — ED Provider Notes (Addendum)
I saw and evaluated the patient, reviewed the resident's note and I agree with the findings and plan.  Pt with HCAP and will start levaquin--pt to be admitted  Pt with dyspnea and cough x 1 day. LE edema noted. Denies chest pain but has had a cough. PE: lungs with rhonchi, CV- regular, ext-2 pluse edema, abd - soft, skin- warm and dry, neuro-no gross focal deficits, HEENT-nl, neck-supple  Plan: abx started in ED, pt admitted  Toy Baker, MD 06/21/11 2140  Toy Baker, MD 06/22/11 1004

## 2011-06-21 NOTE — H&P (Signed)
History and Physical       Hospital Admission Note Date: 06/21/2011  Patient name: Donna Reese Medical record number: [id number] Date of birth: [birth year] Age: 78 y.o. Gender: female PCP: Hannah Beat, MD, MD  Attending physician: Altha Harm, MD   Chief Complaint:  Shortness of breath worsening in the last 3 days  HPI: Patient is a 78 year old Caucasian female with multiple medical problems including history of CHF, hypertension, hyperlipidemia, COPD followed by home hospice presented with increasing fever, shortness of breath and increased swelling in the legs worsening in the last 3 days. Patient states that she's been having productive cough with yellowish sputum. Patient states that she sleeps on 2 pillow however in the ED, patient was sitting upright as she was unable to breathe on lying flat. She otherwise denies any chest pain, palpitations, diaphoresis.  Review of Systems:  Constitutional: In the ED patient has a low-grade fever of 99, Denies chills, diaphoresis, appetite change,   HEENT: Denies photophobia, eye pain, redness, hearing loss, ear pain, congestion, sore throat, rhinorrhea, sneezing, mouth sores, trouble swallowing, neck pain, neck stiffness and tinnitus.   Respiratory: Please see history of present illness Cardiovascular: Please see history of present illness  Gastrointestinal: Denies nausea, vomiting, abdominal pain, diarrhea, constipation, blood in stool and abdominal distention.  Genitourinary: Denies dysuria, urgency, frequency, hematuria, flank pain and difficulty urinating.  Musculoskeletal: Denies myalgias, back pain, joint swelling, arthralgias and gait problem.  Skin: Denies pallor, rash and wound.  Neurological: Denies dizziness, seizures, syncope, weakness, light-headedness, numbness and headaches.  Hematological: Denies adenopathy. Easy bruising, personal or family bleeding history    Psychiatric/Behavioral: Denies suicidal ideation, mood changes, confusion, nervousness, sleep disturbance and agitation  Past Medical History: Past Medical History  Diagnosis Date  . Diabetes mellitus   . Diverticulosis   . Osteopenia   . PUD (peptic ulcer disease)   . Peripheral arterial disease   . Hypertension   . Hyperlipidemia   . Diastolic heart failure     a. echo 9/11: EF 60-65%, mild LVH, grade 2 diast dysfxn, AV mean gradient 9 mmHg, mild MR, RVE, RVSP 59  . Coronary artery disease     a. s/p CABG;  b. cath 8/09: LM occluded; RCA 50%; L-LAD ok, Radial-OM ok; bilat renal art stenosis  . Renovascular hypertension   . Renal artery stenosis     a. s/p L RA stent 8/09  . Paroxysmal a-fib     no coumadin 2/2 GI Blee  . DVT (deep venous thrombosis)   . Pulmonary embolus     s/p IVC filter  . History of gastrointestinal bleeding     no ASA or coumadin  . Contrast dye induced nephropathy   . Orthostatic hypotension   . CHF (congestive heart failure)   . COPD (chronic obstructive pulmonary disease)   . Mesenteric ischemia, chronic    Past Surgical History  Procedure Date  . Appendectomy   . Cholecystectomy   . Abdominal hysterectomy   . Coronary artery bypass graft 09-04-2001    carotid u/s unchnaged 40-50% ICA stenosis 12/1998  . Cataract extraction, bilateral 2001  . Abdominal aortic aneurysm repair 1997  . Nm myoview ltd     neg EF 76%--06/26/2004  . Cardiac catheterization 09-03-01  . Esophagogastroduodenoscopy     abnormal 2007, esophageal stricture 10-12-03-07 gastritis and major bleef  . Vena cava filter placement 07-2007    Dr. Excell Seltzer  . Renal artery doppler 11-17-2008    severe iliac stenosis  Medications: Prior to Admission medications   Medication Sig Start Date End Date Taking? Authorizing Provider  acetaminophen (TYLENOL) 500 MG tablet Take 1,000 mg by mouth every 4 (four) hours as needed. Pain.   Yes Historical Provider, MD  albuterol (PROVENTIL  HFA;VENTOLIN HFA) 108 (90 BASE) MCG/ACT inhaler Inhale 2 puffs into the lungs every 6 (six) hours as needed. Shortness of breath   Yes Historical Provider, MD  albuterol (PROVENTIL) (2.5 MG/3ML) 0.083% nebulizer solution Take 2.5 mg by nebulization every 6 (six) hours as needed. Dyspnea.   Yes Historical Provider, MD  calcium carbonate (TUMS - DOSED IN MG ELEMENTAL CALCIUM) 500 MG chewable tablet Chew 1 tablet by mouth every 4 (four) hours as needed. indigestion   Yes Historical Provider, MD  citalopram (CELEXA) 10 MG tablet Take 10 mg by mouth daily.   Yes Historical Provider, MD  isosorbide mononitrate (IMDUR) 30 MG 24 hr tablet Take 1 tablet (30 mg total) by mouth daily. 06/18/11  Yes Spencer Copland, MD  latanoprost (XALATAN) 0.005 % ophthalmic solution Place 1 drop into both eyes at bedtime.    Yes Historical Provider, MD  LORazepam (ATIVAN) 0.5 MG tablet Take 0.5 mg by mouth every 6 (six) hours as needed. For anxiety   Yes Historical Provider, MD  metolazone (ZAROXOLYN) 5 MG tablet Take 0.5 tablets (2.5 mg total) by mouth 4 (four) times a week. Monday, Wednesday, Friday, and Saturdays 06/18/11  Yes Hannah Beat, MD  metoprolol tartrate (LOPRESSOR) 25 MG tablet Take 1 tablet (25 mg total) by mouth 2 (two) times daily. 06/18/11  Yes Spencer Copland, MD  mirtazapine (REMERON) 15 MG tablet Take 1 tablet (15 mg total) by mouth at bedtime. 06/18/11  Yes Spencer Copland, MD  omeprazole (PRILOSEC) 40 MG capsule Take 40 mg by mouth daily. For GI bleed.   Yes Historical Provider, MD  potassium chloride (KLOR-CON 10) 10 MEQ tablet Take 1 tablet (10 mEq total) by mouth daily. 06/18/11  Yes Hannah Beat, MD  promethazine (PHENERGAN) 12.5 MG tablet Take one tablet by mouth every six hours as needed for nausea, ok to crush 04/03/11  Yes Spencer Copland, MD  simvastatin (ZOCOR) 20 MG tablet Take 1 tablet (20 mg total) by mouth every evening. 06/18/11  Yes Spencer Copland, MD  temazepam (RESTORIL) 7.5 MG capsule  Take 7.5 mg by mouth at bedtime as needed.   Yes Historical Provider, MD  torsemide (DEMADEX) 20 MG tablet Take 20 mg by mouth daily.   Yes Historical Provider, MD  nitroGLYCERIN (NITROSTAT) 0.4 MG SL tablet Place 0.4 mg under the tongue every 5 (five) minutes as needed. May repeat for 2 doses as needed for chest pain. 02/13/11   Altha Harm, MD    Allergies:   Allergies  Allergen Reactions  . Meperidine Hcl   . Niacin     REACTION: rash  . Penicillins   . Sulfonamide Derivatives   . Codeine Rash  . Morphine Rash  . Propoxyphene Hcl Rash    Social History:  reports that she quit smoking about 21 years ago. Her smoking use included Cigarettes. She has a 40 pack-year smoking history. She does not have any smokeless tobacco history on file. She reports that she does not drink alcohol or use illicit drugs. her and he lives at home with her husband and is followed by home hospice nurse she ambulates  with a walker  Family History: Family History  Problem Relation Age of Onset  . Cancer Mother   .  Stroke Father   . Stroke Brother   . Heart attack Brother   . Heart attack Brother   . Heart attack Brother     Physical Exam: Blood pressure 171/80, pulse 76, temperature 99.4 F (37.4 C), temperature source Rectal, resp. rate 16, SpO2 91.00%. General: Alert, awake, oriented x3, in in mild acute distress. HEENT: anicteric sclera, pink conjunctiva, pupils equal and reactive to light and accomodation Neck: supple, no masses or lymphadenopathy, no goiter, no bruits  Heart: Regular rate and rhythm, without murmurs, rubs or gallops. Lungs: Bilateral wheezing with bibasilar crackles . Abdomen: Soft, nontender, nondistended, positive bowel sounds, no masses. Extremities: 2-3+ pitting edema bilaterally  Neuro: Grossly intact, no focal neurological deficits, strength 5/5 upper and lower extremities bilaterally Psych: alert and oriented x 3, normal mood and affect Skin: no rashes or  lesions, warm and dry   LABS on Admission:  Basic Metabolic Panel:  Lab 06/21/11 1610  NA 138  K 2.6*  CL 88*  CO2 38*  GLUCOSE 107*  BUN 27*  CREATININE 0.84  CALCIUM 8.4  MG --  PHOS --   Liver Function Tests:  Lab 06/21/11 2038  AST 20  ALT 21  ALKPHOS 115  BILITOT 0.6  PROT 5.7*  ALBUMIN 2.8*   CBC:  Lab 06/21/11 2038  WBC 7.9  NEUTROABS 6.7  HGB 9.7*  HCT 30.3*  MCV 91.0  PLT 141*   Cardiac Enzymes: No results found for this basename: CKTOTAL:2,CKMB:2,CKMBINDEX:2,TROPONINI:2 in the last 168 hours BNP: No components found with this basename: POCBNP:2 CBG: No results found for this basename: GLUCAP:2 in the last 168 hours   Radiological Exams on Admission: Dg Chest 2 View  06/21/2011  *RADIOLOGY REPORT*  Clinical Data: Shortness of breath, weakness, hypertension, diabetes, COPD, former smoker  CHEST - 2 VIEW  Comparison: 05/27/2011  Findings: Enlargement of cardiac silhouette post CABG. Atherosclerotic calcification aorta. Bone vascular congestion. Right middle lobe infiltrate. Decreased right pleural effusion. Underlying emphysematous changes. Improved pulmonary edema. No pneumothorax. Small left pleural effusion also noted. Bones appear diffusely demineralized with multiple thoracolumbar compression fractures.  IMPRESSION: Improved CHF with small bibasilar effusions and persistent right middle lobe infiltrate.  Original Report Authenticated By: Lollie Marrow, M.D.   Ct Angio Chest W/cm &/or Wo Cm  05/27/2011  *RADIOLOGY REPORT*  Clinical Data: Shortness of breath, back   pain, pleurisy.  History DVT.  CT ANGIOGRAPHY CHEST  Technique:  Multidetector CT imaging of the chest using the standard protocol during bolus administration of intravenous contrast. Multiplanar reconstructed images including MIPs were obtained and reviewed to evaluate the vascular anatomy.  Contrast: 80mL OMNIPAQUE IOHEXOL 300 MG/ML IV SOLN  Comparison: 09/07/2010  Findings: There is good  contrast opacification of the pulmonary artery branches.  No discrete filling defect to suggest acute PE.Previous CABG. Good contrast opacification of the thoracic aorta with no evidence of dissection, aneurysm, or stenosis. There is classic 3-vessel brachiocephalic arch anatomy with calcified plaque at the origins.  Small left and moderate right pleural effusions.  There is biatrial cardiac enlargement.  Sub centimeter anterior mediastinal, prevascular, AP window, pretracheal, precarinal lymph nodes.  There is dependent atelectasis through all basilar segments of the right lower lobe and the dependent aspect of the left lower lobe.  There is reflux of contrast from the right atrium into the hepatic veins.  Remainder of visualized upper abdomen unremarkable. Compression deformities of T5 and T8 are stable.  IMPRESSION:  1.  Negative for acute PE or thoracic  aortic dissection. 2.  Moderate right and small left pleural effusions with dependent atelectasis in the lower lobes.  Original Report Authenticated By: Osa Craver, M.D.   Dg Chest Portable 1 View  05/27/2011  *RADIOLOGY REPORT*  Clinical Data: Short of breath.  Back pain.  PORTABLE CHEST - 1 VIEW  Comparison: 02/08/2011  Findings: Artifact overlies chest.  There is been median sternotomy and CABG.  The heart is enlarged.  There is venous hypertension with interstitial and early alveolar edema.  There are effusions with bilateral lower lobe volume loss.  IMPRESSION: Congestive heart failure.  Pulmonary edema with effusions and basilar volume loss.  Original Report Authenticated By: Thomasenia Sales, M.D.    Assessment/Plan Present on Admission:   .COPD exacerbation - Placed on scheduled nebulizers, inhaled she hurts, by mouth prednisone, and Levaquin IV given community acquired pneumonia  Community acquired pneumonia - Obtain sputum cultures, urine Legionella antigen, urine strep antigen, place on IV Levaquin   .CHF, acute on chronic: - I  will place her on IV Lasix and daily PO metolazone  (takes 4 times a week ), beta blocker, follow echo  - Placed on strict I.'s and O.'s, daily weights, O2 supplementation   .HYPERLIPIDEMIA: - Obtain lipid panel   .HYPERTENSION: BP stable continue cardiac medications   .Atrial fibrillation: Rate controlled  - Continue beta blocker, not on Coumadin scan due to history of GI bleeds   .CKD (chronic kidney disease) stage 4, GFR 15-29 ml/min: Creatinine currently stable   .DEGENERATIVE DISC DISEASE: Continue pain medications PRN  .Hypokalemia: Placed on daily replacement  DVT prophylaxis: Lovenox  CODE STATUS: DO NOT RESUSCITATE  Further plan will depend as patient's clinical course evolves and further radiologic and laboratory data become available.   @Time  Spent on Admission: 1 hour Tuvia Woodrick M.D. [facility] 06/21/2011, 10:28 PM

## 2011-06-21 NOTE — ED Provider Notes (Signed)
History     CSN: [id number]  Arrival date & time 06/21/11  1927   First MD Initiated Contact with Patient 06/21/11 1934      Chief Complaint  Patient presents with  . Shortness of Breath  . Headache  . Fever    (Consider location/radiation/quality/duration/timing/severity/associated sxs/prior treatment) HPI Cc productive cough 3 d, moderate, associated with fever, sob, increased swelling in legs.  Has h/o chf and copd on hospice, has not had increase in home o2 but has had increased sob.  Sx's moderate, worse with exertion, no associated pain.  Also c/o HA left frontal slow onset yesterday described as typical HA, mild. Past Medical History  Diagnosis Date  . Diabetes mellitus   . Diverticulosis   . Osteopenia   . PUD (peptic ulcer disease)   . Peripheral arterial disease   . Hypertension   . Hyperlipidemia   . Diastolic heart failure     a. echo 9/11: EF 60-65%, mild LVH, grade 2 diast dysfxn, AV mean gradient 9 mmHg, mild MR, RVE, RVSP 59  . Coronary artery disease     a. s/p CABG;  b. cath 8/09: LM occluded; RCA 50%; L-LAD ok, Radial-OM ok; bilat renal art stenosis  . Renovascular hypertension   . Renal artery stenosis     a. s/p L RA stent 8/09  . Paroxysmal a-fib     no coumadin 2/2 GI Blee  . DVT (deep venous thrombosis)   . Pulmonary embolus     s/p IVC filter  . History of gastrointestinal bleeding     no ASA or coumadin  . Contrast dye induced nephropathy   . Orthostatic hypotension   . CHF (congestive heart failure)   . COPD (chronic obstructive pulmonary disease)   . Mesenteric ischemia, chronic   . PONV (postoperative nausea and vomiting)   . Myocardial infarction   . Angina   . Dysrhythmia   . Heart murmur   . Asthma   . Shortness of breath   . Recurrent upper respiratory infection (URI)   . Pneumonia   . Anemia   . Blood transfusion   . H/O hiatal hernia   . Headache   . Arthritis   . Mental disorder   . Anxiety   . Depression     Past  Surgical History  Procedure Date  . Appendectomy   . Cholecystectomy   . Abdominal hysterectomy   . Coronary artery bypass graft 09-04-2001    carotid u/s unchnaged 40-50% ICA stenosis 12/1998  . Cataract extraction, bilateral 2001  . Abdominal aortic aneurysm repair 1997  . Nm myoview ltd     neg EF 76%--06/26/2004  . Cardiac catheterization 09-03-01  . Esophagogastroduodenoscopy     abnormal 2007, esophageal stricture 10-12-03-07 gastritis and major bleef  . Vena cava filter placement 07-2007    Dr. Excell Seltzer  . Renal artery doppler 11-17-2008    severe iliac stenosis  . Tonsillectomy   . Breast surgery   . Hernia repair     Family History  Problem Relation Age of Onset  . Cancer Mother   . Stroke Father   . Stroke Brother   . Heart attack Brother   . Heart attack Brother   . Heart attack Brother     History  Substance Use Topics  . Smoking status: Former Smoker -- 1.0 packs/day for 40 years    Types: Cigarettes    Quit date: 04/01/1990  . Smokeless tobacco: Not on file  .  Alcohol Use: No    OB History    Grav Para Term Preterm Abortions TAB SAB Ect Mult Living                  Review of Systems  Constitutional: Positive for fever. Negative for chills.  Respiratory: Positive for shortness of breath.   Cardiovascular: Positive for leg swelling. Negative for chest pain.  Gastrointestinal: Negative for nausea, vomiting and abdominal pain.  Genitourinary: Negative for dysuria and frequency.  Neurological: Positive for headaches. Negative for weakness and numbness.  All other systems reviewed and are negative.    Allergies  Meperidine hcl; Niacin; Penicillins; Sulfonamide derivatives; Codeine; Morphine; and Propoxyphene hcl  Home Medications   No current outpatient prescriptions on file.  BP 189/63  Pulse 83  Temp(Src) 98.7 F (37.1 C) (Oral)  Resp 18  Ht 5\' 5"  (1.651 m)  Wt 150 lb 6.4 oz (68.221 kg)  BMI 25.03 kg/m2  SpO2 96%  Physical Exam  Nursing note  and vitals reviewed. Constitutional: She appears well-developed and well-nourished.  HENT:  Head: Normocephalic and atraumatic.  Eyes: Right eye exhibits no discharge. Left eye exhibits no discharge.  Neck: Normal range of motion. Neck supple.  Cardiovascular: Normal rate, regular rhythm, S1 normal and S2 normal.  Frequent extrasystoles are present.  Murmur heard.  Systolic murmur is present with a grade of 2/6  Pulmonary/Chest: Effort normal. She has decreased breath sounds. She has wheezes (mild). She has rales (mild).  Abdominal: Soft. There is no tenderness.  Musculoskeletal: She exhibits no edema and no tenderness.  Neurological: She is alert. GCS eye subscore is 4. GCS verbal subscore is 5. GCS motor subscore is 6.  Skin: Skin is warm and dry.  Psychiatric: She has a normal mood and affect. Her behavior is normal.    ED Course  Procedures (including critical care time)  Labs Reviewed  CBC - Abnormal; Notable for the following:    RBC 3.33 (*)    Hemoglobin 9.7 (*)    HCT 30.3 (*)    Platelets 141 (*)    All other components within normal limits  DIFFERENTIAL - Abnormal; Notable for the following:    Neutrophils Relative 84 (*)    Lymphocytes Relative 9 (*)    All other components within normal limits  COMPREHENSIVE METABOLIC PANEL - Abnormal; Notable for the following:    Potassium 2.6 (*)    Chloride 88 (*)    CO2 38 (*)    Glucose, Bld 107 (*)    BUN 27 (*)    Total Protein 5.7 (*)    Albumin 2.8 (*)    GFR calc non Af Amer 65 (*)    GFR calc Af Amer 76 (*)    All other components within normal limits  URINALYSIS, ROUTINE W REFLEX MICROSCOPIC - Abnormal; Notable for the following:    Leukocytes, UA TRACE (*)    All other components within normal limits  CBC - Abnormal; Notable for the following:    RBC 3.51 (*)    Hemoglobin 10.4 (*)    HCT 32.3 (*)    Platelets 149 (*)    All other components within normal limits  LACTIC ACID, PLASMA  POCT I-STAT TROPONIN  I  URINE MICROSCOPIC-ADD ON  PRO B NATRIURETIC PEPTIDE  CULTURE, BLOOD (ROUTINE X 2)  CULTURE, BLOOD (ROUTINE X 2)  CARDIAC PANEL(CRET KIN+CKTOT+MB+TROPI)  CARDIAC PANEL(CRET KIN+CKTOT+MB+TROPI)  CARDIAC PANEL(CRET KIN+CKTOT+MB+TROPI)  BASIC METABOLIC PANEL  CREATININE, SERUM  CULTURE, SPUTUM-ASSESSMENT  STREP PNEUMONIAE URINARY ANTIGEN  LEGIONELLA ANTIGEN, URINE   Dg Chest 2 View  06/21/2011  *RADIOLOGY REPORT*  Clinical Data: Shortness of breath, weakness, hypertension, diabetes, COPD, former smoker  CHEST - 2 VIEW  Comparison: 05/27/2011  Findings: Enlargement of cardiac silhouette post CABG. Atherosclerotic calcification aorta. Bone vascular congestion. Right middle lobe infiltrate. Decreased right pleural effusion. Underlying emphysematous changes. Improved pulmonary edema. No pneumothorax. Small left pleural effusion also noted. Bones appear diffusely demineralized with multiple thoracolumbar compression fractures.  IMPRESSION: Improved CHF with small bibasilar effusions and persistent right middle lobe infiltrate.  Original Report Authenticated By: Lollie Marrow, M.D.     1. Community acquired pneumonia       MDM  Pt is in nad, no resp distress, c/f pna, considered acs, chf, pe.  Labs obtained, cxr shows improved chf, RML infiltrate.  Starting levaquin.  Internal medicine consulted will admit.  Pt stable        Elijio Miles, MD 06/22/11 [phone]

## 2011-06-21 NOTE — ED Notes (Signed)
EDP made aware of low potassium

## 2011-06-21 NOTE — ED Notes (Addendum)
Pt stated that she has been really SOB today, increased swelling in BLE, and a HA all day. BLE has 2+ pitting Edema. Pedal pulses are weak but present. Capillary refill brisk. BLE warm to touch. Pt is on 4L home O2. Lung sounds diminished. No coughing. HA has left upper throbbing HA. HA are intermittent. No blurry or changes in vision. HA pain is 4 out of 10 on pain scale. No weakness or neurological deficits. No CP. Pt also has been having elevated temperature. At home the highest temp is 101.9.  Currently, 99.9.

## 2011-06-21 NOTE — Telephone Encounter (Signed)
Noted, I had rec'd ER with the thought that hospice wasn't available and that led to the call here.  Will defer to hospice.  Will forward to PCP as FYI.  App help of all involved.

## 2011-06-21 NOTE — Telephone Encounter (Signed)
Caller: Mary/Hospice nurse; PCP: Hannah Beat T.; CB#: [phone]; ; ; Call regarding Medication Questions; needs to verify medications since coming home from [facility] 06/14/11 and her medications list does not match what she has.  States she is on oxygen at 4L/min.  Wants to make sure Dr. Patsy Lager wants to be the attending MD of record for Hospice to contact.   Reviewed current medications per Epic list; wants to know if should continue vitorin 10/40 q hs; ventolin inhaler, 2 puffs q 4-6h prn wheezing; deltasone 10mg  qd; demadex 20mg  qd; phenergan 12.5mg  q 6 h  prn, ok to crush; ativan 0.5mg  q 6h prn anxiety; celexa 10mg  qd; mvi liquid 10ml qd; toprol XL.   INFO TO OFFICE FOR MD REVIEW/CALLBACK. MAY REACH RN AT [phone].

## 2011-06-22 DIAGNOSIS — I369 Nonrheumatic tricuspid valve disorder, unspecified: Secondary | ICD-10-CM

## 2011-06-22 LAB — CARDIAC PANEL(CRET KIN+CKTOT+MB+TROPI)
Relative Index: INVALID (ref 0.0–2.5)
Relative Index: INVALID (ref 0.0–2.5)
Total CK: 21 U/L (ref 7–177)
Total CK: 22 U/L (ref 7–177)
Troponin I: <0.3 ng/mL (ref <0.30)
Troponin I: <0.3 ng/mL (ref <0.30)

## 2011-06-22 LAB — BASIC METABOLIC PANEL
Chloride: 90 mEq/L — ABNORMAL LOW (ref 96–112)
GFR calc Af Amer: 80 mL/min — ABNORMAL LOW (ref >90)
Potassium: 3.5 mEq/L (ref 3.5–5.1)
Sodium: 140 mEq/L (ref 135–145)

## 2011-06-22 LAB — GLUCOSE, CAPILLARY: Glucose-Capillary: 133 mg/dL — ABNORMAL HIGH (ref 70–99)

## 2011-06-22 LAB — CREATININE, SERUM
GFR calc Af Amer: 80 mL/min — ABNORMAL LOW (ref >90)
GFR calc non Af Amer: 69 mL/min — ABNORMAL LOW (ref >90)

## 2011-06-22 LAB — EXPECTORATED SPUTUM ASSESSMENT W GRAM STAIN, RFLX TO RESP C: Special Requests: NORMAL

## 2011-06-22 MED ORDER — TORSEMIDE 20 MG PO TABS
20.0000 mg | ORAL_TABLET | Freq: Every day | ORAL | Status: DC
  Administered 2011-06-22 – 2011-06-23 (×2): 20 mg via ORAL
  Filled 2011-06-22 (×2): qty 1

## 2011-06-22 MED ORDER — POTASSIUM CHLORIDE CRYS ER 20 MEQ PO TBCR
40.0000 meq | EXTENDED_RELEASE_TABLET | Freq: Two times a day (BID) | ORAL | Status: DC
  Administered 2011-06-22 – 2011-06-25 (×6): 40 meq via ORAL
  Filled 2011-06-22 (×9): qty 2

## 2011-06-22 MED ORDER — TORSEMIDE 20 MG PO TABS
20.0000 mg | ORAL_TABLET | Freq: Once | ORAL | Status: DC
  Filled 2011-06-22: qty 1

## 2011-06-22 NOTE — Evaluation (Signed)
Physical Therapy Evaluation Patient Details Name: Donna Reese MRN: [id number] DOB: 1932/03/04 Today's Date: 06/22/2011  Problem List:  Patient Active Problem List  Diagnoses  . HYPERLIPIDEMIA  . MIGRAINE WITHOUT AURA  . POLYNEUROPATHY IN DIABETES  . HYPERTENSION  . Atrial fibrillation  . Chronic systolic heart failure  . RENAL ARTERY STENOSIS  . PERIPHERAL VASCULAR DISEASE  . COPD  . CKD (chronic kidney disease) stage 4, GFR 15-29 ml/min  . DEGENERATIVE DISC DISEASE  . OSTEOPENIA  . Personal history of venous thrombosis and embolism  . SOB (shortness of breath)  . Chronic diastolic heart failure  . Other malaise and fatigue  . Anemia of chronic disease  . B12 deficiency  . Thrombocytopenia  . Mesenteric ischemia, chronic  . DNR (do not resuscitate)  . Hospice care  . COPD exacerbation  . Community acquired pneumonia  . CHF, acute on chronic  . Hypokalemia    Past Medical History:  Past Medical History  Diagnosis Date  . Diabetes mellitus   . Diverticulosis   . Osteopenia   . PUD (peptic ulcer disease)   . Peripheral arterial disease   . Hypertension   . Hyperlipidemia   . Diastolic heart failure     a. echo 9/11: EF 60-65%, mild LVH, grade 2 diast dysfxn, AV mean gradient 9 mmHg, mild MR, RVE, RVSP 59  . Coronary artery disease     a. s/p CABG;  b. cath 8/09: LM occluded; RCA 50%; L-LAD ok, Radial-OM ok; bilat renal art stenosis  . Renovascular hypertension   . Renal artery stenosis     a. s/p L RA stent 8/09  . Paroxysmal a-fib     no coumadin 2/2 GI Blee  . DVT (deep venous thrombosis)   . Pulmonary embolus     s/p IVC filter  . History of gastrointestinal bleeding     no ASA or coumadin  . Contrast dye induced nephropathy   . Orthostatic hypotension   . CHF (congestive heart failure)   . COPD (chronic obstructive pulmonary disease)   . Mesenteric ischemia, chronic   . PONV (postoperative nausea and vomiting)   . Myocardial infarction   . Angina    . Dysrhythmia   . Heart murmur   . Asthma   . Shortness of breath   . Recurrent upper respiratory infection (URI)   . Pneumonia   . Anemia   . Blood transfusion   . H/O hiatal hernia   . Headache   . Arthritis   . Mental disorder   . Anxiety   . Depression    Past Surgical History:  Past Surgical History  Procedure Date  . Appendectomy   . Cholecystectomy   . Abdominal hysterectomy   . Coronary artery bypass graft 09-04-2001    carotid u/s unchnaged 40-50% ICA stenosis 12/1998  . Cataract extraction, bilateral 2001  . Abdominal aortic aneurysm repair 1997  . Nm myoview ltd     neg EF 76%--06/26/2004  . Cardiac catheterization 09-03-01  . Esophagogastroduodenoscopy     abnormal 2007, esophageal stricture 10-12-03-07 gastritis and major bleef  . Vena cava filter placement 07-2007    Dr. Excell Seltzer  . Renal artery doppler 11-17-2008    severe iliac stenosis  . Tonsillectomy   . Breast surgery   . Hernia repair     PT Assessment/Plan/Recommendation PT Assessment Clinical Impression Statement: Patient s/p COPD exacerbation with decr mobility secondary to edema bil feet and SOB with activity.  Will benefit  from PT to address balance and endurance issues.  Given patient's current weakness, recommend either home with 24 hour care other than husband  alone (husband is 90 and uses cane)  or short term NHP with therapy prior to d/c home.   PT Recommendation/Assessment: Patient will need skilled PT in the acute care venue PT Problem List: Decreased activity tolerance;Decreased balance;Decreased mobility;Decreased knowledge of use of DME;Decreased safety awareness Barriers to Discharge: Decreased caregiver support PT Therapy Diagnosis : Difficulty walking PT Plan PT Frequency: Min 3X/week PT Treatment/Interventions: DME instruction;Gait training;Functional mobility training;Therapeutic activities;Therapeutic exercise;Balance training;Patient/family education;Stair training PT  Recommendation Follow Up Recommendations: Skilled nursing facility;Supervision/Assistance - 24 hour Equipment Recommended: Defer to next venue PT Goals  Acute Rehab PT Goals PT Goal Formulation: With patient Time For Goal Achievement: 7 days Pt will go Supine/Side to Sit: with modified independence PT Goal: Supine/Side to Sit - Progress: Goal set today Pt will go Sit to Supine/Side: with modified independence PT Goal: Sit to Supine/Side - Progress: Goal set today Pt will go Sit to Stand: with supervision;with upper extremity assist PT Goal: Sit to Stand - Progress: Goal set today Pt will go Stand to Sit: with supervision;with upper extremity assist PT Goal: Stand to Sit - Progress: Goal set today Pt will Transfer Bed to Chair/Chair to Bed: with supervision PT Transfer Goal: Bed to Chair/Chair to Bed - Progress: Goal set today Pt will Ambulate: 1 - 15 feet;with supervision;with least restrictive assistive device PT Goal: Ambulate - Progress: Goal set today Pt will Go Up / Down Stairs: 1-2 stairs;with min assist;with least restrictive assistive device PT Goal: Up/Down Stairs - Progress: Goal set today Pt will Perform Home Exercise Program: Independently PT Goal: Perform Home Exercise Program - Progress: Goal set today  PT Evaluation Precautions/Restrictions  Precautions Precautions: Fall Required Braces or Orthoses: No Restrictions Weight Bearing Restrictions: No Prior Functioning  Home Living Lives With: Spouse Receives Help From: Family Type of Home: House Home Layout: One level Home Access: Stairs to enter [employer] of Steps: 1 Bathroom Shower/Tub: Forensic scientist: Standard Home Adaptive Equipment: Bedside commode/3-in-1;Walker - rolling;Wheelchair - manual (home O2) Additional Comments: Husband is 18 years old and uses cane Prior Function Level of Independence: Needs assistance with ADLs;Independent with gait;Requires assistive device  for independence;Independent with transfers Bath: Moderate Driving: No Vocation: Retired Comments: Hospice will be available - may incr their services per pt.  Son lives close by and his wife has had cancer and cannot help as he is just now going back to work.  Husband or hospice assisted patient with sponge bathing PTA per pt. Cognition Cognition Arousal/Alertness: Awake/alert Overall Cognitive Status: Appears within functional limits for tasks assessed Sensation/Coordination Sensation Light Touch: Appears Intact Stereognosis: Not tested Hot/Cold: Not tested Proprioception: Not tested Coordination Gross Motor Movements are Fluid and Coordinated: Yes Fine Motor Movements are Fluid and Coordinated: Yes Extremity Assessment RUE Assessment RUE Assessment: Within Functional Limits LUE Assessment LUE Assessment: Within Functional Limits RLE Assessment RLE Assessment: Within Functional Limits LLE Assessment LLE Assessment: Within Functional Limits Mobility (including Balance) Bed Mobility Bed Mobility: Yes Rolling Right: 4: Min assist Right Sidelying to Sit: 4: Min assist Right Sidelying to Sit Details (indicate cue type and reason): took incr time and momentum Sitting - Scoot to [place] of Bed: 4: Min assist Sitting - Scoot to [place] of Bed Details (indicate cue type and reason): cues needed for reciprocal scooting. Transfers Transfers: Yes Sit to Stand: 1: +2 Total assist;Patient percentage (comment);With  upper extremity assist;From bed (pt = 65%) Sit to Stand Details (indicate cue type and reason): patient needed cues for hand placement and for initiation of movement for anterior translation of pelvis Stand to Sit: 1: +2 Total assist;Patient percentage (comment);With upper extremity assist;With armrests;To chair/3-in-1 (pt = 65%) Stand to Sit Details: patient with uncontrolled descent into chair.  Needed cues for hand placement.   Stand Pivot Transfers: 1: +2 Total assist;Patient  percentage (comment) (pt = 65%) Stand Pivot Transfer Details (indicate cue type and reason): Patient had difficulty achieving anterior translation of pelvis.  Kept weight posterior and had somewhat flexed posture needing assist of 2 persons for stabiliity.  Very unsteady on feet.  Took incr time to transfer bed to 3N1 and then to recliner as well due to need for incr assist for weight shifting for pivot transfer.   Ambulation/Gait Ambulation/Gait: No Stairs: No Wheelchair Mobility Wheelchair Mobility: No  Posture/Postural Control Posture/Postural Control: Postural limitations Postural Limitations: Flexed posture Balance Balance Assessed: No   End of Session PT - End of Session Equipment Utilized During Treatment: Gait belt Activity Tolerance: Patient tolerated treatment well Patient left: in chair;with call bell in reach Nurse Communication: Mobility status for transfers General Behavior During Session: [facility] for tasks performed Cognition: [facility] for tasks performed  INGOLD,Jakeel Starliper 06/22/2011, 3:09 PM  [facility] [phone] (pager)

## 2011-06-22 NOTE — Progress Notes (Signed)
CRITICAL VALUE ALERT  Critical value received:  C02 44   Date of notification:  06/22/2011   Time of notification:  0741  Critical value read back:yes  Nurse who received alert:  Nino Glow RN  MD notified (1st page):  Dr. Ashley Royalty  Time of first page:  [phone]        MD notified (2nd page):  Time of second page:  Responding MD:  Dr. Ashley Royalty  Time MD responded:  [phone]

## 2011-06-22 NOTE — Progress Notes (Signed)
Patient admitted with shortness of breath, CHF exacerbation, pneumonia.  DNR on chart.  Found patient receiving care from facility staff, no pain issues noted.  Reviewed chart and patient is receiving Levaquin for pneumonia, IV Lasix for aggressive diuresis and nebulizers for shortness of breath management.  Continue current plan of care and await discharge planning.  Encourage a call to Hospice at [phone] with any needs.  Charlott Holler, RN, BSN Hospice

## 2011-06-22 NOTE — Progress Notes (Signed)
Subjective: Patient well known to me from previous admissions. States that she feels better today than she did yesterday. Patient states that she had fever for several days at home. She also states that she's had some body aches. The body aches have resolved and the patient has been afebrile since being here in the hospital Objective: Filed Vitals:   06/22/11 0908 06/22/11 1210 06/22/11 1400 06/22/11 1405  BP:  162/60 165/63   Pulse:  78 72   Temp:   97.8 F (36.6 C)   TempSrc:   Oral   Resp:  20 18   Height:      Weight:      SpO2: 98% 96% 97% 93%   Weight change:   Intake/Output Summary (Last 24 hours) at 06/22/11 1828 Last data filed at 06/22/11 1651  Gross per 24 hour  Intake    993 ml  Output      0 ml  Net    993 ml    General: Alert, awake, oriented x3, in no acute distress.  HEENT: [place]/AT PEERL, EOMI Neck: Trachea midline,  no masses, no thyromegal,y no JVD, no carotid bruit OROPHARYNX:  Moist, No exudate/ erythema/lesions.  Heart: Regular rate and rhythm, without murmurs, rubs, gallops, PMI non-displaced, no heaves or thrills on palpation.  Lungs: Clear to auscultation, no wheezing or rhonchi noted. No increased vocal fremitus resonant to percussion  Abdomen: Soft, nontender, nondistended, positive bowel sounds, no masses no hepatosplenomegaly noted..   Lab Results:  Basename 06/22/11 0600 06/21/11 2342 06/21/11 2038  NA 140 -- 138  K 3.5 -- 2.6*  CL 90* -- 88*  CO2 44* -- 38*  GLUCOSE 127* -- 107*  BUN 24* -- 27*  CREATININE 0.80 0.80 --  CALCIUM 8.5 -- 8.4  MG -- -- --  PHOS -- -- --    Basename 06/21/11 2038  AST 20  ALT 21  ALKPHOS 115  BILITOT 0.6  PROT 5.7*  ALBUMIN 2.8*   No results found for this basename: LIPASE:2,AMYLASE:2 in the last 72 hours  Basename 06/21/11 2342 06/21/11 2038  WBC 9.2 7.9  NEUTROABS -- 6.7  HGB 10.4* 9.7*  HCT 32.3* 30.3*  MCV 92.0 91.0  PLT 149* 141*    Basename 06/22/11 1207 06/22/11 0521 06/21/11 2342    CKTOTAL 21 21 22   CKMB 1.3 1.4 1.5  CKMBINDEX -- -- --  TROPONINI <0.30 <0.30 <0.30   No components found with this basename: POCBNP:3 No results found for this basename: DDIMER:2 in the last 72 hours No results found for this basename: HGBA1C:2 in the last 72 hours No results found for this basename: CHOL:2,HDL:2,LDLCALC:2,TRIG:2,CHOLHDL:2,LDLDIRECT:2 in the last 72 hours No results found for this basename: TSH,T4TOTAL,FREET3,T3FREE,THYROIDAB in the last 72 hours No results found for this basename: VITAMINB12:2,FOLATE:2,FERRITIN:2,TIBC:2,IRON:2,RETICCTPCT:2 in the last 72 hours  Micro Results: Recent Results (from the past 240 hour(s))  CULTURE, SPUTUM-ASSESSMENT     Status: Normal   Collection Time   06/22/11 12:48 AM      Component Value Range Status Comment   Specimen Description SPUTUM   Final    Special Requests Normal   Final    Sputum evaluation     Final    Value: THIS SPECIMEN IS ACCEPTABLE. RESPIRATORY CULTURE REPORT TO FOLLOW.   Report Status 06/22/2011 FINAL   Final   CULTURE, RESPIRATORY     Status: Normal (Preliminary result)   Collection Time   06/22/11 12:48 AM      Component Value Range Status  Comment   Specimen Description SPUTUM   Final    Special Requests NONE   Final    Gram Stain     Final    Value: MODERATE WBC PRESENT,BOTH PMN AND MONONUCLEAR     RARE SQUAMOUS EPITHELIAL CELLS PRESENT     MODERATE GRAM POSITIVE COCCI IN PAIRS     FEW GRAM NEGATIVE COCCI   Culture PENDING   Incomplete    Report Status PENDING   Incomplete     Studies/Results: Dg Chest 2 View  06/21/2011  *RADIOLOGY REPORT*  Clinical Data: Shortness of breath, weakness, hypertension, diabetes, COPD, former smoker  CHEST - 2 VIEW  Comparison: 05/27/2011  Findings: Enlargement of cardiac silhouette post CABG. Atherosclerotic calcification aorta. Bone vascular congestion. Right middle lobe infiltrate. Decreased right pleural effusion. Underlying emphysematous changes. Improved pulmonary  edema. No pneumothorax. Small left pleural effusion also noted. Bones appear diffusely demineralized with multiple thoracolumbar compression fractures.  IMPRESSION: Improved CHF with small bibasilar effusions and persistent right middle lobe infiltrate.  Original Report Authenticated By: Lollie Marrow, M.D.   Ct Angio Chest W/cm &/or Wo Cm  05/27/2011  *RADIOLOGY REPORT*  Clinical Data: Shortness of breath, back   pain, pleurisy.  History DVT.  CT ANGIOGRAPHY CHEST  Technique:  Multidetector CT imaging of the chest using the standard protocol during bolus administration of intravenous contrast. Multiplanar reconstructed images including MIPs were obtained and reviewed to evaluate the vascular anatomy.  Contrast: 80mL OMNIPAQUE IOHEXOL 300 MG/ML IV SOLN  Comparison: 09/07/2010  Findings: There is good contrast opacification of the pulmonary artery branches.  No discrete filling defect to suggest acute PE.Previous CABG. Good contrast opacification of the thoracic aorta with no evidence of dissection, aneurysm, or stenosis. There is classic 3-vessel brachiocephalic arch anatomy with calcified plaque at the origins.  Small left and moderate right pleural effusions.  There is biatrial cardiac enlargement.  Sub centimeter anterior mediastinal, prevascular, AP window, pretracheal, precarinal lymph nodes.  There is dependent atelectasis through all basilar segments of the right lower lobe and the dependent aspect of the left lower lobe.  There is reflux of contrast from the right atrium into the hepatic veins.  Remainder of visualized upper abdomen unremarkable. Compression deformities of T5 and T8 are stable.  IMPRESSION:  1.  Negative for acute PE or thoracic aortic dissection. 2.  Moderate right and small left pleural effusions with dependent atelectasis in the lower lobes.  Original Report Authenticated By: Osa Craver, M.D.   Dg Chest Portable 1 View  05/27/2011  *RADIOLOGY REPORT*  Clinical Data: Short  of breath.  Back pain.  PORTABLE CHEST - 1 VIEW  Comparison: 02/08/2011  Findings: Artifact overlies chest.  There is been median sternotomy and CABG.  The heart is enlarged.  There is venous hypertension with interstitial and early alveolar edema.  There are effusions with bilateral lower lobe volume loss.  IMPRESSION: Congestive heart failure.  Pulmonary edema with effusions and basilar volume loss.  Original Report Authenticated By: Thomasenia Sales, M.D.    Medications: I have reviewed the patient's current medications. Scheduled Meds:   . albuterol  2.5 mg Nebulization Q6H  . budesonide  0.25 mg Nebulization BID  . citalopram  10 mg Oral Daily  . enoxaparin  40 mg Subcutaneous Q24H  . furosemide  40 mg Intravenous Q12H  . ipratropium  0.5 mg Nebulization Q6H  . isosorbide mononitrate  30 mg Oral Daily  . latanoprost  1 drop Both Eyes  QHS  . levofloxacin (LEVAQUIN) IV  750 mg Intravenous QHS  . metolazone  2.5 mg Oral Daily  . metoprolol tartrate  25 mg Oral BID  . mirtazapine  15 mg Oral QHS  . pantoprazole  40 mg Oral Q1200  . potassium chloride  40 mEq Oral Once  . potassium chloride  40 mEq Oral Daily  . predniSONE  60 mg Oral Once  . simvastatin  20 mg Oral QPM  . sodium chloride  3 mL Intravenous Q12H  . DISCONTD: levofloxacin (LEVAQUIN) IV  500 mg Intravenous To Major   Continuous Infusions:  PRN Meds:.sodium chloride, acetaminophen, albuterol, LORazepam, nitroGLYCERIN, ondansetron (ZOFRAN) IV, sodium chloride, zolpidem, DISCONTD: acetaminophen, DISCONTD: temazepam Assessment/Plan: Patient Active Hospital Problem List: COPD exacerbation (06/21/2011)   Assessment: We'll continue steroids, beta agonists and Levaquin.     CKD (chronic kidney disease) stage 4, GFR 15-29 ml/min (12/27/2009)   Assessment: Creatinine stable     HYPERTENSION (06/25/2006)   Assessment: Blood pressure elevated. I suspect that the steroids probably contributing to more elevated blood pressure than  usual.    Atrial fibrillation (02/10/2008)   Assessment: Heart rate controlled.  Community acquired pneumonia (06/21/2011)   Assessment: Continue level   CHF, acute on chronic (06/21/2011)   Assessment: Patient known to be very sensitive to diuretics. We'll change Lasix back to oral torsemide and Zaroxolyn and be very cautious with diureses.   Hypokalemia (06/21/2011)   Assessment: Patient's potassium borderline. Will replace orally      LOS: 1 day

## 2011-06-22 NOTE — Progress Notes (Signed)
  Echocardiogram 2D Echocardiogram has been performed.  Donna Reese L 06/22/2011, 10:04 AM

## 2011-06-23 LAB — MRSA PCR SCREENING: MRSA by PCR: NEGATIVE

## 2011-06-23 LAB — BASIC METABOLIC PANEL
Chloride: 87 mEq/L — ABNORMAL LOW (ref 96–112)
Creatinine, Ser: 0.99 mg/dL (ref 0.50–1.10)
GFR calc Af Amer: 62 mL/min — ABNORMAL LOW (ref >90)

## 2011-06-23 LAB — LEGIONELLA ANTIGEN, URINE

## 2011-06-23 MED ORDER — LEVALBUTEROL HCL 0.63 MG/3ML IN NEBU
0.6300 mg | INHALATION_SOLUTION | Freq: Four times a day (QID) | RESPIRATORY_TRACT | Status: DC
  Administered 2011-06-24 – 2011-06-25 (×7): 0.63 mg via RESPIRATORY_TRACT
  Filled 2011-06-23 (×11): qty 3

## 2011-06-23 MED ORDER — HYDRALAZINE HCL 20 MG/ML IJ SOLN
10.0000 mg | Freq: Three times a day (TID) | INTRAMUSCULAR | Status: DC | PRN
  Administered 2011-06-23: 10 mg via INTRAVENOUS
  Filled 2011-06-23: qty 0.5

## 2011-06-23 MED ORDER — FUROSEMIDE 10 MG/ML IJ SOLN
20.0000 mg | Freq: Two times a day (BID) | INTRAMUSCULAR | Status: DC
  Administered 2011-06-23 – 2011-06-24 (×3): 20 mg via INTRAVENOUS
  Filled 2011-06-23 (×4): qty 2

## 2011-06-23 MED ORDER — LEVALBUTEROL HCL 0.63 MG/3ML IN NEBU
0.6300 mg | INHALATION_SOLUTION | Freq: Four times a day (QID) | RESPIRATORY_TRACT | Status: DC | PRN
  Filled 2011-06-23: qty 3

## 2011-06-23 MED ORDER — LEVOFLOXACIN IN D5W 250 MG/50ML IV SOLN
250.0000 mg | Freq: Every day | INTRAVENOUS | Status: DC
  Administered 2011-06-23 – 2011-06-24 (×2): 250 mg via INTRAVENOUS
  Filled 2011-06-23 (×3): qty 50

## 2011-06-23 MED ORDER — METOLAZONE 5 MG PO TABS
5.0000 mg | ORAL_TABLET | Freq: Every day | ORAL | Status: DC
  Administered 2011-06-24 – 2011-06-25 (×2): 5 mg via ORAL
  Filled 2011-06-23 (×2): qty 1

## 2011-06-23 NOTE — Progress Notes (Signed)
MEDICATION RELATED CONSULT NOTE - INITIAL   Pharmacy Consult for Renal Adjustment of Levaquin Indication: COPD exacerbation  Allergies  Allergen Reactions  . Meperidine Hcl   . Niacin     REACTION: rash  . Penicillins   . Sulfonamide Derivatives   . Codeine Rash  . Morphine Rash  . Propoxyphene Hcl Rash    Patient Measurements: Height: 5\' 5"  (165.1 cm) Weight: 148 lb 5.9 oz (67.3 kg) (Bed Weight) IBW/kg (Calculated) : 57  Vital Signs: Temp: 98.9 F (37.2 C) (03/24 1330) Temp src: Oral (03/24 1330) BP: 188/80 mmHg (03/24 1330) Pulse Rate: 107  (03/24 1330) Intake/Output from previous day: 03/23 0701 - 03/24 0700 In: 1353 [P.O.:1200; I.V.:3; IV Piggyback:150] Out: 575 [Urine:575] Intake/Output from this shift: Total I/O In: 243 [P.O.:240; I.V.:3] Out: 350 [Urine:350]  Labs:  Basename 06/23/11 0520 06/22/11 0600 06/21/11 2342 06/21/11 2038  WBC -- -- 9.2 7.9  HGB -- -- 10.4* 9.7*  HCT -- -- 32.3* 30.3*  PLT -- -- 149* 141*  APTT -- -- -- --  CREATININE 0.99 0.80 0.80 --  LABCREA -- -- -- --  CREATININE 0.99 0.80 0.80 --  CREAT24HRUR -- -- -- --  MG -- -- -- --  PHOS -- -- -- --  ALBUMIN -- -- -- 2.8*  PROT -- -- -- 5.7*  ALBUMIN -- -- -- 2.8*  AST -- -- -- 20  ALT -- -- -- 21  ALKPHOS -- -- -- 115  BILITOT -- -- -- 0.6  BILIDIR -- -- -- --  IBILI -- -- -- --   Estimated Creatinine Clearance: 42.8 ml/min (by C-G formula based on Cr of 0.99).   Microbiology: Recent Results (from the past 720 hour(s))  CULTURE, BLOOD (ROUTINE X 2)     Status: Normal (Preliminary result)   Collection Time   06/21/11  8:26 PM      Component Value Range Status Comment   Specimen Description BLOOD ARM RIGHT   Final    Special Requests     Final    Value: BOTTLES DRAWN AEROBIC AND ANAEROBIC AERO 10CC, ANAE 8CC   Culture  Setup Time [id number]   Final    Culture     Final    Value:        BLOOD CULTURE RECEIVED NO GROWTH TO DATE CULTURE WILL BE HELD FOR 5 DAYS BEFORE  ISSUING A FINAL NEGATIVE REPORT   Report Status PENDING   Incomplete   CULTURE, BLOOD (ROUTINE X 2)     Status: Normal (Preliminary result)   Collection Time   06/21/11  9:07 PM      Component Value Range Status Comment   Specimen Description BLOOD HAND RIGHT   Final    Special Requests     Final    Value: BOTTLES DRAWN AEROBIC AND ANAEROBIC AERO 10CC,ANAE 8CC   Culture  Setup Time [id number]   Final    Culture     Final    Value:        BLOOD CULTURE RECEIVED NO GROWTH TO DATE CULTURE WILL BE HELD FOR 5 DAYS BEFORE ISSUING A FINAL NEGATIVE REPORT   Report Status PENDING   Incomplete   CULTURE, SPUTUM-ASSESSMENT     Status: Normal   Collection Time   06/22/11 12:48 AM      Component Value Range Status Comment   Specimen Description SPUTUM   Final    Special Requests Normal   Final    Sputum evaluation  Final    Value: THIS SPECIMEN IS ACCEPTABLE. RESPIRATORY CULTURE REPORT TO FOLLOW.   Report Status 06/22/2011 FINAL   Final   CULTURE, RESPIRATORY     Status: Normal (Preliminary result)   Collection Time   06/22/11 12:48 AM      Component Value Range Status Comment   Specimen Description SPUTUM   Final    Special Requests NONE   Final    Gram Stain     Final    Value: MODERATE WBC PRESENT,BOTH PMN AND MONONUCLEAR     RARE SQUAMOUS EPITHELIAL CELLS PRESENT     MODERATE GRAM POSITIVE COCCI IN PAIRS     FEW GRAM NEGATIVE COCCI   Culture Culture reincubated for better growth   Final    Report Status PENDING   Incomplete   MRSA PCR SCREENING     Status: Normal   Collection Time   06/23/11  4:38 AM      Component Value Range Status Comment   MRSA by PCR NEGATIVE  NEGATIVE  Final      Medications:  Scheduled:    . albuterol  2.5 mg Nebulization Q6H  . budesonide  0.25 mg Nebulization BID  . citalopram  10 mg Oral Daily  . enoxaparin  40 mg Subcutaneous Q24H  . ipratropium  0.5 mg Nebulization Q6H  . isosorbide mononitrate  30 mg Oral Daily  . latanoprost  1 drop Both Eyes  QHS  . levofloxacin (LEVAQUIN) IV  750 mg Intravenous QHS  . metolazone  5 mg Oral Daily  . metoprolol tartrate  25 mg Oral BID  . mirtazapine  15 mg Oral QHS  . pantoprazole  40 mg Oral Q1200  . potassium chloride  40 mEq Oral BID  . simvastatin  20 mg Oral QPM  . sodium chloride  3 mL Intravenous Q12H  . torsemide  20 mg Oral Daily  . DISCONTD: furosemide  40 mg Intravenous Q12H  . DISCONTD: metolazone  2.5 mg Oral Daily  . DISCONTD: potassium chloride  40 mEq Oral Daily  . DISCONTD: torsemide  20 mg Oral Once    Assessment: 78 yo F on empiric Levaquin and steroids for COPD exacerbation.  SCr = 0.99, CrCl ~ 40.  Based on renal function will adjust Levaquin dose per P&T policy.  Goal of Therapy:  Renal adjustment of medications  Plan:  Change Levaquin to 250mg  IV Q24h.  Callaway Hardigree, Judie Bonus 06/23/2011,3:48 PM

## 2011-06-23 NOTE — Progress Notes (Signed)
Notified by NT that patient's bp was elevated at 194/66.  Patient is incontinent and was being changed/cleaned during the time of this reading.  Re-checked patient's bp manually and it was 164/68.  Patient is asleep, but arousable and asymptomatic.  Will continue to monitor.  awalker,rn

## 2011-06-23 NOTE — Progress Notes (Signed)
Called to patient's room during shift change with c/o sob.  Vital signs taken at 1855 (99.7; 121; 20; 215/95; 91% on 4L).  10mg  prn hydralazine given and prn ativan with no relief per patient.  She was very sob with rhonchi & wheezing throughout.  PRN nebs given and respiratory therapist called to bedside.  Vitals retaken at 1950 (101.5, 120, 207/59, 92% on 4L) Rapid response called to bedside-prn tylenol & scheduled po metoprolol given.  Benedetto Coons also bedside at this time.  Received new prn orders and an order to place foley. Vital signs taken at 2015 (113; 40; 127/70; 92 on 4L).  Foley placed, patient currently resting comfortably.  Will continue to monitor.  awalker,rn

## 2011-06-23 NOTE — Progress Notes (Signed)
Called by primary RN to see pt for increased HR & BP.  On arrival HR 120s, SBP 200s, t=101.5.  Pt given tylenol po & early dose of night time metoprolol.  Lenny Pastel, NP at bedside to eval.  Pt states she is feeling better & BP down to 129/94.  Will cont. To monitor

## 2011-06-23 NOTE — Progress Notes (Signed)
Subjective: It was initially unclear as to Donna Reese's reason for Hospice, and Donna Reese is unable to articulate. However I spoke with Hospice Nurse today, and per her records, Donna Reese has chronic mesenteric ischemia which has led to poor oral intake and significant weight loss. Donna Reese also has chronic LE edema and had a GFR of 31 at the time of her admission to Hospice. Today, her GFR is 65 which may be a reflection of fluid overload. Objective: Filed Vitals:   06/23/11 0825 06/23/11 1153 06/23/11 1330 06/23/11 1422  BP:  182/65 188/80   Pulse:  106 107   Temp:   98.9 F (37.2 C)   TempSrc:   Oral   Resp:   18   Height:      Weight:      SpO2: 96%  92% 94%   Weight change: -0.921 kg (-2 lb 0.5 oz)  Intake/Output Summary (Last 24 hours) at 06/23/11 1709 Last data filed at 06/23/11 1331  Gross per 24 hour  Intake    513 ml  Output    925 ml  Net   -412 ml    General: Alert, awake, oriented x3, in no acute distress.  HEENT: [place]/AT PEERL, EOMI Neck: Trachea midline,  no masses, no thyromegal,y no JVD, no carotid bruit OROPHARYNX:  Moist, No exudate/ erythema/lesions.  Heart: Regular rate and rhythm, without murmurs, rubs, gallops, PMI non-displaced, no heaves or thrills on palpation.  Lungs: Clear to auscultation, no wheezing or rhonchi noted. No increased vocal fremitus resonant to percussion  Abdomen: Soft, nontender, nondistended, positive bowel sounds, no masses no hepatosplenomegaly noted. Extremities: Donna Reese has 2+ pitting edema to the bilateral lower extremities. Lab Results:  Basename 06/23/11 0520 06/22/11 0600  NA 135 140  K 4.2 3.5  CL 87* 90*  CO2 36* 44*  GLUCOSE 92 127*  BUN 26* 24*  CREATININE 0.99 0.80  CALCIUM 8.4 8.5  MG -- --  PHOS -- --    Basename 06/21/11 2038  AST 20  ALT 21  ALKPHOS 115  BILITOT 0.6  PROT 5.7*  ALBUMIN 2.8*   No results found for this basename: LIPASE:2,AMYLASE:2 in the last 72 hours  Basename 06/21/11 2342 06/21/11 2038  WBC 9.2 7.9  NEUTROABS --  6.7  HGB 10.4* 9.7*  HCT 32.3* 30.3*  MCV 92.0 91.0  PLT 149* 141*    Basename 06/22/11 1207 06/22/11 0521 06/21/11 2342  CKTOTAL 21 21 22   CKMB 1.3 1.4 1.5  CKMBINDEX -- -- --  TROPONINI <0.30 <0.30 <0.30   No components found with this basename: POCBNP:3 No results found for this basename: DDIMER:2 in the last 72 hours No results found for this basename: HGBA1C:2 in the last 72 hours No results found for this basename: CHOL:2,HDL:2,LDLCALC:2,TRIG:2,CHOLHDL:2,LDLDIRECT:2 in the last 72 hours No results found for this basename: TSH,T4TOTAL,FREET3,T3FREE,THYROIDAB in the last 72 hours No results found for this basename: VITAMINB12:2,FOLATE:2,FERRITIN:2,TIBC:2,IRON:2,RETICCTPCT:2 in the last 72 hours  Micro Results: Recent Results (from the past 240 hour(s))  CULTURE, BLOOD (ROUTINE X 2)     Status: Normal (Preliminary result)   Collection Time   06/21/11  8:26 PM      Component Value Range Status Comment   Specimen Description BLOOD ARM RIGHT   Final    Special Requests     Final    Value: BOTTLES DRAWN AEROBIC AND ANAEROBIC AERO 10CC, ANAE 8CC   Culture  Setup Time [id number]   Final    Culture     Final  Value:        BLOOD CULTURE RECEIVED NO GROWTH TO DATE CULTURE WILL BE HELD FOR 5 DAYS BEFORE ISSUING A FINAL NEGATIVE REPORT   Report Status PENDING   Incomplete   CULTURE, BLOOD (ROUTINE X 2)     Status: Normal (Preliminary result)   Collection Time   06/21/11  9:07 PM      Component Value Range Status Comment   Specimen Description BLOOD HAND RIGHT   Final    Special Requests     Final    Value: BOTTLES DRAWN AEROBIC AND ANAEROBIC AERO 10CC,ANAE 8CC   Culture  Setup Time [id number]   Final    Culture     Final    Value:        BLOOD CULTURE RECEIVED NO GROWTH TO DATE CULTURE WILL BE HELD FOR 5 DAYS BEFORE ISSUING A FINAL NEGATIVE REPORT   Report Status PENDING   Incomplete   CULTURE, SPUTUM-ASSESSMENT     Status: Normal   Collection Time   06/22/11 12:48 AM       Component Value Range Status Comment   Specimen Description SPUTUM   Final    Special Requests Normal   Final    Sputum evaluation     Final    Value: THIS SPECIMEN IS ACCEPTABLE. RESPIRATORY CULTURE REPORT TO FOLLOW.   Report Status 06/22/2011 FINAL   Final   CULTURE, RESPIRATORY     Status: Normal (Preliminary result)   Collection Time   06/22/11 12:48 AM      Component Value Range Status Comment   Specimen Description SPUTUM   Final    Special Requests NONE   Final    Gram Stain     Final    Value: MODERATE WBC PRESENT,BOTH PMN AND MONONUCLEAR     RARE SQUAMOUS EPITHELIAL CELLS PRESENT     MODERATE GRAM POSITIVE COCCI IN PAIRS     FEW GRAM NEGATIVE COCCI   Culture Culture reincubated for better growth   Final    Report Status PENDING   Incomplete   MRSA PCR SCREENING     Status: Normal   Collection Time   06/23/11  4:38 AM      Component Value Range Status Comment   MRSA by PCR NEGATIVE  NEGATIVE  Final     Studies/Results: Dg Chest 2 View  06/21/2011  *RADIOLOGY REPORT*  Clinical Data: Shortness of breath, weakness, hypertension, diabetes, COPD, former smoker  CHEST - 2 VIEW  Comparison: 05/27/2011  Findings: Enlargement of cardiac silhouette post CABG. Atherosclerotic calcification aorta. Bone vascular congestion. Right middle lobe infiltrate. Decreased right pleural effusion. Underlying emphysematous changes. Improved pulmonary edema. No pneumothorax. Small left pleural effusion also noted. Bones appear diffusely demineralized with multiple thoracolumbar compression fractures.  IMPRESSION: Improved CHF with small bibasilar effusions and persistent right middle lobe infiltrate.  Original Report Authenticated By: Lollie Marrow, M.D.   Ct Angio Chest W/cm &/or Wo Cm  05/27/2011  *RADIOLOGY REPORT*  Clinical Data: Shortness of breath, back   pain, pleurisy.  History DVT.  CT ANGIOGRAPHY CHEST  Technique:  Multidetector CT imaging of the chest using the standard protocol during bolus  administration of intravenous contrast. Multiplanar reconstructed images including MIPs were obtained and reviewed to evaluate the vascular anatomy.  Contrast: 80mL OMNIPAQUE IOHEXOL 300 MG/ML IV SOLN  Comparison: 09/07/2010  Findings: There is good contrast opacification of the pulmonary artery branches.  No discrete filling defect to suggest acute PE.Previous CABG. Good contrast  opacification of the thoracic aorta with no evidence of dissection, aneurysm, or stenosis. There is classic 3-vessel brachiocephalic arch anatomy with calcified plaque at the origins.  Small left and moderate right pleural effusions.  There is biatrial cardiac enlargement.  Sub centimeter anterior mediastinal, prevascular, AP window, pretracheal, precarinal lymph nodes.  There is dependent atelectasis through all basilar segments of the right lower lobe and the dependent aspect of the left lower lobe.  There is reflux of contrast from the right atrium into the hepatic veins.  Remainder of visualized upper abdomen unremarkable. Compression deformities of T5 and T8 are stable.  IMPRESSION:  1.  Negative for acute PE or thoracic aortic dissection. 2.  Moderate right and small left pleural effusions with dependent atelectasis in the lower lobes.  Original Report Authenticated By: Osa Craver, M.D.   Dg Chest Portable 1 View  05/27/2011  *RADIOLOGY REPORT*  Clinical Data: Short of breath.  Back pain.  PORTABLE CHEST - 1 VIEW  Comparison: 02/08/2011  Findings: Artifact overlies chest.  There is been median sternotomy and CABG.  The heart is enlarged.  There is venous hypertension with interstitial and early alveolar edema.  There are effusions with bilateral lower lobe volume loss.  IMPRESSION: Congestive heart failure.  Pulmonary edema with effusions and basilar volume loss.  Original Report Authenticated By: Thomasenia Sales, M.D.    Medications: I have reviewed the patient's current medications. Scheduled Meds:    .  albuterol  2.5 mg Nebulization Q6H  . budesonide  0.25 mg Nebulization BID  . citalopram  10 mg Oral Daily  . enoxaparin  40 mg Subcutaneous Q24H  . ipratropium  0.5 mg Nebulization Q6H  . isosorbide mononitrate  30 mg Oral Daily  . latanoprost  1 drop Both Eyes QHS  . levofloxacin (LEVAQUIN) IV  250 mg Intravenous QHS  . metolazone  5 mg Oral Daily  . metoprolol tartrate  25 mg Oral BID  . mirtazapine  15 mg Oral QHS  . pantoprazole  40 mg Oral Q1200  . potassium chloride  40 mEq Oral BID  . simvastatin  20 mg Oral QPM  . sodium chloride  3 mL Intravenous Q12H  . torsemide  20 mg Oral Daily  . DISCONTD: furosemide  40 mg Intravenous Q12H  . DISCONTD: levofloxacin (LEVAQUIN) IV  750 mg Intravenous QHS  . DISCONTD: metolazone  2.5 mg Oral Daily  . DISCONTD: potassium chloride  40 mEq Oral Daily  . DISCONTD: torsemide  20 mg Oral Once   Continuous Infusions:  PRN Meds:.sodium chloride, acetaminophen, albuterol, LORazepam, nitroGLYCERIN, ondansetron (ZOFRAN) IV, sodium chloride, zolpidem Assessment/Plan: Patient Active Hospital Problem List: COPD exacerbation (06/21/2011)   Assessment: We'll continue steroids, beta agonists and Levaquin.     CKD (chronic kidney disease) stage 4, GFR 15-29 ml/min (12/27/2009)   Assessment: Creatinine stable     HYPERTENSION (06/25/2006)   Assessment: Blood pressure elevated. I suspect that the steroids probably contributing to more elevated blood pressure than usual.    Atrial fibrillation (02/10/2008)   Assessment: Heart rate controlled.  Community acquired pneumonia (06/21/2011)   Assessment: Continue Levaquin   CHF, acute on chronic (06/21/2011)   Assessment: Patient known to be very sensitive to diuretics. Patient's urine output has been marginal today thus I will change torsemide IV Lasix 20 mg IV twice a day. We'll continue to monitor her renal function closely   Hypokalemia (06/21/2011)   Assessment: Patient's potassium borderline. Will  replace orally  LOS: 2 days

## 2011-06-23 NOTE — Progress Notes (Signed)
Cosign for Alan Tripp RN assessments, med admin, I/O, and notes 

## 2011-06-23 NOTE — Progress Notes (Signed)
Pt BP 210/80 manually. Pt complains of headache. MD notified. New orders for prn hydralazine. Will given hydralazine and recheck BP. Will cont to monitor.

## 2011-06-23 NOTE — Progress Notes (Signed)
Patient admitted with COPD exacerbation.  DNR on chart.  Found patient resting, RN did not disturb.  No family present.  Reviewed chart and patient continues to improve with COPD exacerbation.  Patient tolerating steroids and Levaquin with no issues.  Patient has had elevated blood pressure today and this is being monitored.  Continue current plan of care and await discharge planning.  Encourage a call to Hospice with any needs.    Charlott Holler, RN, BSN Hospice

## 2011-06-24 LAB — CBC
MCH: 29.2 pg (ref 26.0–34.0)
MCHC: 31.3 g/dL (ref 30.0–36.0)
MCV: 93.3 fL (ref 78.0–100.0)
Platelets: 123 10*3/uL — ABNORMAL LOW (ref 150–400)
RDW: 15.1 % (ref 11.5–15.5)
WBC: 3.6 10*3/uL — ABNORMAL LOW (ref 4.0–10.5)

## 2011-06-24 LAB — DIFFERENTIAL
Basophils Absolute: 0 10*3/uL (ref 0.0–0.1)
Basophils Relative: 0 % (ref 0–1)
Eosinophils Absolute: 0 10*3/uL (ref 0.0–0.7)
Eosinophils Relative: 0 % (ref 0–5)

## 2011-06-24 LAB — BASIC METABOLIC PANEL
CO2: 44 mEq/L (ref 19–32)
Calcium: 8.3 mg/dL — ABNORMAL LOW (ref 8.4–10.5)
Creatinine, Ser: 1 mg/dL (ref 0.50–1.10)
Glucose, Bld: 99 mg/dL (ref 70–99)

## 2011-06-24 LAB — CULTURE, RESPIRATORY W GRAM STAIN

## 2011-06-24 MED ORDER — FUROSEMIDE 10 MG/ML IJ SOLN
40.0000 mg | Freq: Two times a day (BID) | INTRAMUSCULAR | Status: DC
  Administered 2011-06-25: 40 mg via INTRAVENOUS
  Filled 2011-06-24 (×3): qty 4

## 2011-06-24 NOTE — Progress Notes (Signed)
Utilization review completed. Donna Reese 06/24/2011 

## 2011-06-24 NOTE — Progress Notes (Signed)
Cardiac Rehab 0930 We received order. Read PT's eval. Pt unable to ambulate with PT on eval.  PT can notify us when pt ready for our services. Will continue to follow. Bowdy Bair DunlapRN

## 2011-06-24 NOTE — Progress Notes (Signed)
[facility] homecare SW note- Pt well-known to this LCSW.  She was admitted to [facility] about 2 months ago, followed at home for about a month.  Pt appeared to be in terminal decline, was subsequently admitted to [facility], where she remained for several weeks, "leveled off." She was then placed at [facility] as her husband had been placed there.  The couple remained there for several weeks (not Medicare days due to preceding [facility] admission, not three day qualifying hospital stay.) The couple was d/c'ed back home from SNF last week; Pt was readmitted to homecare services via [facility].  Events of the weekend precipitated hospitalization.  Discussed issues with Pt and with dau-in-law Debbie (cell [phone]).  Pt and family are both interested in possible [facility] again , to use Medicare benefit, then determine long range plans from there.  [facility] receptive; will confer with [facility] SW, assist prn.

## 2011-06-24 NOTE — Plan of Care (Signed)
Problem: Phase I Progression Outcomes Goal: EF % per last Echo/documented,Core Reminder form on chart Outcome: Completed/Met Date Met:  06/24/11 55-60% (06/22/2011)

## 2011-06-24 NOTE — Progress Notes (Signed)
Pt sitting up in recliner when arrived.  Affect appropriate.  Pt interactive.  Oriented x3, but forgetful.  She reported that her dyspnea has improved, but she is still SOB at rest-although mild and it does become severe with any exertion. She is pleased with the care she is receiving.    Pt admitted on 06/21/11 with Dx of COPD exacerbation, Community Aquired pneumonia, Hypokalemia, CHF acute on chronic, HTN.  She has been started on Lasix 20mg  BID IV, Zaroxolyn 5mg  PO daily, and Levaquin 250mg  IV daily.  CXR showed RML infiltrate, and bibasilar effusions.  Current VS are T-98.6, R-32, P-98, BP 143/63.   Pt under Hospice services for Debility.  Reviewed information in chart with Dr Barbee Shropshire.  Per MD this is a hospice related admission.   Please call [facility] with Pt movement, concerns, and or discharge planning at [phone].  Pam PetersonRN, [facility]

## 2011-06-24 NOTE — Progress Notes (Signed)
Subjective:  Patient states that she's feeling much better today. She is now requesting that she goes-in place for rehabilitation. I discussed this with social worker and the patient is willing to forego her hospice benefit at this time and would go for rehabilitation. Objective: Filed Vitals:   06/24/11 0634 06/24/11 0831 06/24/11 1114 06/24/11 1409  BP: 155/81  147/70 143/63  Pulse: 70  98 89  Temp: 98.8 F (37.1 C)   98.6 F (37 C)  TempSrc: Oral   Oral  Resp: 30   22  Height:      Weight: 68.1 kg (150 lb 2.1 oz)     SpO2: 100% 97%  98%   Weight change: 0.8 kg (1 lb 12.2 oz)  Intake/Output Summary (Last 24 hours) at 06/24/11 1900 Last data filed at 06/24/11 1800  Gross per 24 hour  Intake   1073 ml  Output   1550 ml  Net   -477 ml    General: Alert, awake, oriented x3, in no acute distress.  HEENT: [place]/AT PEERL, EOMI Neck: Trachea midline,  no masses, no thyromegal,y no JVD, no carotid bruit OROPHARYNX:  Moist, No exudate/ erythema/lesions.  Heart: Regular rate and rhythm, without murmurs, rubs, gallops, PMI non-displaced, no heaves or thrills on palpation.  Lungs: Clear to auscultation, no wheezing or rhonchi noted. No increased vocal fremitus resonant to percussion  Abdomen: Soft, nontender, nondistended, positive bowel sounds, no masses no hepatosplenomegaly noted. Extremities: Pt has1+ pitting edema to the bilateral lower extremities. Lab Results:  [facility] 06/24/11 0615 06/23/11 0520  NA 136 135  K 4.5 4.2  CL 87* 87*  CO2 44* 36*  GLUCOSE 99 92  BUN 19 26*  CREATININE 1.00 0.99  CALCIUM 8.3* 8.4  MG -- --  PHOS -- --    Basename 06/21/11 2038  AST 20  ALT 21  ALKPHOS 115  BILITOT 0.6  PROT 5.7*  ALBUMIN 2.8*   No results found for this basename: LIPASE:2,AMYLASE:2 in the last 72 hours  Basename 06/24/11 0615 06/21/11 2342 06/21/11 2038  WBC 3.6* 9.2 --  NEUTROABS 2.8 -- 6.7  HGB 9.1* 10.4* --  HCT 29.1* 32.3* --  MCV 93.3 92.0 --  PLT 123* 149*  --    Basename 06/22/11 1207 06/22/11 0521 06/21/11 2342  CKTOTAL 21 21 22   CKMB 1.3 1.4 1.5  CKMBINDEX -- -- --  TROPONINI <0.30 <0.30 <0.30   No components found with this basename: POCBNP:3 No results found for this basename: DDIMER:2 in the last 72 hours No results found for this basename: HGBA1C:2 in the last 72 hours No results found for this basename: CHOL:2,HDL:2,LDLCALC:2,TRIG:2,CHOLHDL:2,LDLDIRECT:2 in the last 72 hours No results found for this basename: TSH,T4TOTAL,FREET3,T3FREE,THYROIDAB in the last 72 hours No results found for this basename: VITAMINB12:2,FOLATE:2,FERRITIN:2,TIBC:2,IRON:2,RETICCTPCT:2 in the last 72 hours  Micro Results: Recent Results (from the past 240 hour(s))  CULTURE, BLOOD (ROUTINE X 2)     Status: Normal (Preliminary result)   Collection Time   06/21/11  8:26 PM      Component Value Range Status Comment   Specimen Description BLOOD ARM RIGHT   Final    Special Requests     Final    Value: BOTTLES DRAWN AEROBIC AND ANAEROBIC AERO 10CC, ANAE 8CC   Culture  Setup Time [id number]   Final    Culture     Final    Value:        BLOOD CULTURE RECEIVED NO GROWTH TO DATE CULTURE WILL BE HELD  FOR 5 DAYS BEFORE ISSUING A FINAL NEGATIVE REPORT   Report Status PENDING   Incomplete   CULTURE, BLOOD (ROUTINE X 2)     Status: Normal (Preliminary result)   Collection Time   06/21/11  9:07 PM      Component Value Range Status Comment   Specimen Description BLOOD HAND RIGHT   Final    Special Requests     Final    Value: BOTTLES DRAWN AEROBIC AND ANAEROBIC AERO 10CC,ANAE 8CC   Culture  Setup Time [id number]   Final    Culture     Final    Value:        BLOOD CULTURE RECEIVED NO GROWTH TO DATE CULTURE WILL BE HELD FOR 5 DAYS BEFORE ISSUING A FINAL NEGATIVE REPORT   Report Status PENDING   Incomplete   CULTURE, SPUTUM-ASSESSMENT     Status: Normal   Collection Time   06/22/11 12:48 AM      Component Value Range Status Comment   Specimen Description SPUTUM    Final    Special Requests Normal   Final    Sputum evaluation     Final    Value: THIS SPECIMEN IS ACCEPTABLE. RESPIRATORY CULTURE REPORT TO FOLLOW.   Report Status 06/22/2011 FINAL   Final   CULTURE, RESPIRATORY     Status: Normal   Collection Time   06/22/11 12:48 AM      Component Value Range Status Comment   Specimen Description SPUTUM   Final    Special Requests NONE   Final    Gram Stain     Final    Value: MODERATE WBC PRESENT,BOTH PMN AND MONONUCLEAR     RARE SQUAMOUS EPITHELIAL CELLS PRESENT     MODERATE GRAM POSITIVE COCCI IN PAIRS     FEW GRAM NEGATIVE COCCI   Culture     Final    Value: ABUNDANT MORAXELLA CATARRHALIS(BRANHAMELLA)     Note: BETA LACTAMASE POSITIVE   Report Status 06/24/2011 FINAL   Final   MRSA PCR SCREENING     Status: Normal   Collection Time   06/23/11  4:38 AM      Component Value Range Status Comment   MRSA by PCR NEGATIVE  NEGATIVE  Final     Studies/Results: Dg Chest 2 View  06/21/2011  *RADIOLOGY REPORT*  Clinical Data: Shortness of breath, weakness, hypertension, diabetes, COPD, former smoker  CHEST - 2 VIEW  Comparison: 05/27/2011  Findings: Enlargement of cardiac silhouette post CABG. Atherosclerotic calcification aorta. Bone vascular congestion. Right middle lobe infiltrate. Decreased right pleural effusion. Underlying emphysematous changes. Improved pulmonary edema. No pneumothorax. Small left pleural effusion also noted. Bones appear diffusely demineralized with multiple thoracolumbar compression fractures.  IMPRESSION: Improved CHF with small bibasilar effusions and persistent right middle lobe infiltrate.  Original Report Authenticated By: Lollie Marrow, M.D.   Ct Angio Chest W/cm &/or Wo Cm  05/27/2011  *RADIOLOGY REPORT*  Clinical Data: Shortness of breath, back   pain, pleurisy.  History DVT.  CT ANGIOGRAPHY CHEST  Technique:  Multidetector CT imaging of the chest using the standard protocol during bolus administration of intravenous  contrast. Multiplanar reconstructed images including MIPs were obtained and reviewed to evaluate the vascular anatomy.  Contrast: 80mL OMNIPAQUE IOHEXOL 300 MG/ML IV SOLN  Comparison: 09/07/2010  Findings: There is good contrast opacification of the pulmonary artery branches.  No discrete filling defect to suggest acute PE.Previous CABG. Good contrast opacification of the thoracic aorta with no evidence of  dissection, aneurysm, or stenosis. There is classic 3-vessel brachiocephalic arch anatomy with calcified plaque at the origins.  Small left and moderate right pleural effusions.  There is biatrial cardiac enlargement.  Sub centimeter anterior mediastinal, prevascular, AP window, pretracheal, precarinal lymph nodes.  There is dependent atelectasis through all basilar segments of the right lower lobe and the dependent aspect of the left lower lobe.  There is reflux of contrast from the right atrium into the hepatic veins.  Remainder of visualized upper abdomen unremarkable. Compression deformities of T5 and T8 are stable.  IMPRESSION:  1.  Negative for acute PE or thoracic aortic dissection. 2.  Moderate right and small left pleural effusions with dependent atelectasis in the lower lobes.  Original Report Authenticated By: Osa Craver, M.D.   Dg Chest Portable 1 View  05/27/2011  *RADIOLOGY REPORT*  Clinical Data: Short of breath.  Back pain.  PORTABLE CHEST - 1 VIEW  Comparison: 02/08/2011  Findings: Artifact overlies chest.  There is been median sternotomy and CABG.  The heart is enlarged.  There is venous hypertension with interstitial and early alveolar edema.  There are effusions with bilateral lower lobe volume loss.  IMPRESSION: Congestive heart failure.  Pulmonary edema with effusions and basilar volume loss.  Original Report Authenticated By: Thomasenia Sales, M.D.    Medications: I have reviewed the patient's current medications. Scheduled Meds:    . budesonide  0.25 mg Nebulization BID    . citalopram  10 mg Oral Daily  . enoxaparin  40 mg Subcutaneous Q24H  . furosemide  20 mg Intravenous BID  . ipratropium  0.5 mg Nebulization Q6H  . isosorbide mononitrate  30 mg Oral Daily  . latanoprost  1 drop Both Eyes QHS  . levalbuterol  0.63 mg Nebulization Q6H  . levofloxacin (LEVAQUIN) IV  250 mg Intravenous QHS  . metolazone  5 mg Oral Daily  . metoprolol tartrate  25 mg Oral BID  . mirtazapine  15 mg Oral QHS  . pantoprazole  40 mg Oral Q1200  . potassium chloride  40 mEq Oral BID  . simvastatin  20 mg Oral QPM  . sodium chloride  3 mL Intravenous Q12H  . DISCONTD: albuterol  2.5 mg Nebulization Q6H   Continuous Infusions:  PRN Meds:.sodium chloride, acetaminophen, hydrALAZINE, levalbuterol, LORazepam, nitroGLYCERIN, ondansetron (ZOFRAN) IV, sodium chloride, zolpidem, DISCONTD: albuterol Assessment/Plan: Patient Active Hospital Problem List: COPD exacerbation (06/21/2011)   Assessment: We'll continue inhaled steroids, beta agonists and Levaquin.     CKD (chronic kidney disease) stage 4, GFR 15-29 ml/min (12/27/2009)   Assessment: Creatinine stable     HYPERTENSION (06/25/2006)   Assessment: Blood pressure elevated. I suspect that the steroids probably contributing to more elevated blood pressure than usual.    Atrial fibrillation (02/10/2008)   Assessment: Heart rate controlled.  Community acquired pneumonia (06/21/2011)   Assessment: Continue Levaquin   CHF, acute on chronic (06/21/2011)   Assessment: Patient known to be very sensitive to diuretics. Patient's fluid balance is still positive thus I will increase her Lasix even more to 40 mg IV twice a day   Hypokalemia (06/21/2011)   Assessment: Repleted    Disposition: Likely skilled nursing facility for rehabilitation   LOS: 3 days

## 2011-06-24 NOTE — Plan of Care (Signed)
Problem: Phase II Progression Outcomes Goal: Walk in hall or up in chair TID Outcome: Completed/Met Date Met:  06/24/11 OOB to chair

## 2011-06-24 NOTE — Progress Notes (Signed)
Clinical Social Work Department BRIEF PSYCHOSOCIAL ASSESSMENT 06/24/2011  Patient:  Donna Reese, Donna Reese     Account Number:  [id number]     Admit date:  06/21/2011  Clinical Social Worker:  Dennison Bulla  Date/Time:  06/24/2011 04:00 PM  Referred by:  Physician  Date Referred:  06/24/2011 Referred for  SNF Placement   Other Referral:   Interview type:  Patient Other interview type:    PSYCHOSOCIAL DATA Living Status:  HUSBAND Admitted from facility:   Level of care:   Primary support name:  Algernon Huxley Primary support relationship to patient:  SPOUSE Degree of support available:   Lacking from husband. Supportive granddaughter    CURRENT CONCERNS Current Concerns  Post-Acute Placement   Other Concerns:    SOCIAL WORK ASSESSMENT / PLAN CSW received call from hospice worker Dewayne Hatch [phone]) who stated patient was interested in returning to [facility]. CSW met with patient at bedside who reported she had been at [facility] in the past but paid privately. Patient is interested in SNF but only if insurance will cover the costs. Per hospice worker, patient can dc her hospice Medicare. CSW received permission to fax out information to [facility]. CSW will continue to follow to assist with needs.   Assessment/plan status:  Psychosocial Support/Ongoing Assessment of Needs Other assessment/ plan:   Information/referral to community resources:   Faxed out to [employer]    PATIENT'S/FAMILY'S RESPONSE TO PLAN OF CARE: Patient was pleasant and involved in assessment. Patient was interested in SNF and asked questions regarding her husband attending with her. Patient agreeable to family involvement.

## 2011-06-24 NOTE — Progress Notes (Signed)
Wrong time- assessment 2230

## 2011-06-24 NOTE — Progress Notes (Signed)
CRITICAL VALUE ALERT  Critical value received:  CO2 44   Date of notification:  06/24/2011  Time of notification:  0745  Critical value read back:yes  Nurse who received alert:  Marylee Floras  MD notified (1st page):  Dr Ashley Royalty  Time of first page:  [phone]  MD notified (2nd page):  Time of second page:  Responding MD: Dr Ashley Royalty  Time MD responded:  [phone]

## 2011-06-24 NOTE — Progress Notes (Signed)
Cosign for Alan Tripp RN assessment, med admin, care plan/pathway, I/O, and notes 

## 2011-06-25 DIAGNOSIS — R634 Abnormal weight loss: Secondary | ICD-10-CM | POA: Diagnosis present

## 2011-06-25 DIAGNOSIS — K551 Chronic vascular disorders of intestine: Secondary | ICD-10-CM | POA: Diagnosis present

## 2011-06-25 LAB — BASIC METABOLIC PANEL
Calcium: 8.2 mg/dL — ABNORMAL LOW (ref 8.4–10.5)
GFR calc non Af Amer: 52 mL/min — ABNORMAL LOW (ref >90)
Sodium: 139 mEq/L (ref 135–145)

## 2011-06-25 MED ORDER — BUDESONIDE 0.25 MG/2ML IN SUSP: 0.2500 mg | Freq: Two times a day (BID) | RESPIRATORY_TRACT | Status: DC

## 2011-06-25 MED ORDER — METOLAZONE 5 MG PO TABS: 5.0000 mg | ORAL_TABLET | Freq: Every day | ORAL | Status: DC

## 2011-06-25 MED ORDER — TEMAZEPAM 7.5 MG PO CAPS: 7.5000 mg | ORAL_CAPSULE | Freq: Every evening | ORAL | Status: DC | PRN

## 2011-06-25 MED ORDER — LORAZEPAM 0.5 MG PO TABS: 0.5000 mg | ORAL_TABLET | Freq: Four times a day (QID) | ORAL | Status: DC | PRN

## 2011-06-25 MED ORDER — LEVOFLOXACIN 250 MG PO TABS: 250.0000 mg | ORAL_TABLET | Freq: Every day | ORAL | Status: AC

## 2011-06-25 NOTE — Progress Notes (Signed)
Pt sitting up in recliner when arrived.  Per report she is being discharged to [facility] today.  Ambulance to be called.  Pt reports breathing much better at rest.  Still SOB with exertion.  Per Pt she needs to go to rehab so she can regain strength to get home.  SW to arrange transportation.   Please call [facility] with Pt movement at [phone]. Elijah Birk RN, [facility]

## 2011-06-25 NOTE — Progress Notes (Signed)
CSW faxed dc summary to SNF. SNF agreeable to admission. CSW informed patient, dtr-in-law and RN of dc and all agreeable. CSW called hospice worker who will meet patient at SNF to complete paperwork. CSW informed SNF that hospice worker will be there. CSW prepared dc packet and coordinated transportation via [facility]. CSW is signing off. [place], [place] [phone]

## 2011-06-25 NOTE — Progress Notes (Signed)
CSW spoke with patient and gave bed offers. Patient chose [facility]. CSW spoke with hospice worker to verify that patient would lose Hospice Medicare. Patient is aware of this and is agreeable to this plan. CSW received permission to call Debbie (dtr-in-law) who is agreeable to this plan as well. CSW text paged MD regarding bed placement at SNF. CSW will coordinate dc planning and continue to follow. [place], [place] [phone]

## 2011-06-25 NOTE — Discharge Summary (Signed)
Donna Reese MRN: [id number] DOB/AGE: 1931/11/04 78 y.o.  Admit date: 06/21/2011 Discharge date: 06/25/2011  Primary Care Physician:  Hannah Beat, MD, MD   Discharge Diagnoses:   Patient Active Problem List  Diagnoses  . HYPERLIPIDEMIA  . MIGRAINE WITHOUT AURA  . POLYNEUROPATHY IN DIABETES  . HYPERTENSION  . Atrial fibrillation  . Chronic systolic heart failure  . RENAL ARTERY STENOSIS  . PERIPHERAL VASCULAR DISEASE  . COPD  . CKD (chronic kidney disease) stage 4, GFR 15-29 ml/min  . DEGENERATIVE DISC DISEASE  . OSTEOPENIA  . Personal history of venous thrombosis and embolism  . SOB (shortness of breath)  . Chronic diastolic heart failure  . Other malaise and fatigue  . Anemia of chronic disease  . B12 deficiency  . Thrombocytopenia  . Mesenteric ischemia, chronic  . DNR (do not resuscitate)  . Hospice care  . COPD exacerbation  . Community acquired pneumonia  . CHF, acute on chronic  . Hypokalemia  . Chronic mesenteric ischemia  . Weight loss, unintentional    DISCHARGE MEDICATION: Medication List  As of 06/25/2011  1:47 PM   TAKE these medications         acetaminophen 500 MG tablet   Commonly known as: TYLENOL   Take 1,000 mg by mouth every 4 (four) hours as needed. Pain.      albuterol (2.5 MG/3ML) 0.083% nebulizer solution   Commonly known as: PROVENTIL   Take 2.5 mg by nebulization every 6 (six) hours as needed. Dyspnea.      albuterol 108 (90 BASE) MCG/ACT inhaler   Commonly known as: PROVENTIL HFA;VENTOLIN HFA   Inhale 2 puffs into the lungs every 6 (six) hours as needed. Shortness of breath      budesonide 0.25 MG/2ML nebulizer solution   Commonly known as: PULMICORT   Take 2 mLs (0.25 mg total) by nebulization 2 (two) times daily.      calcium carbonate 500 MG chewable tablet   Commonly known as: TUMS - dosed in mg elemental calcium   Chew 1 tablet by mouth every 4 (four) hours as needed. indigestion      citalopram 10 MG tablet   Commonly known as: CELEXA   Take 10 mg by mouth daily.      isosorbide mononitrate 30 MG 24 hr tablet   Commonly known as: IMDUR   Take 1 tablet (30 mg total) by mouth daily.      latanoprost 0.005 % ophthalmic solution   Commonly known as: XALATAN   Place 1 drop into both eyes at bedtime.      levofloxacin 250 MG tablet   Commonly known as: LEVAQUIN   Take 1 tablet (250 mg total) by mouth daily.      LORazepam 0.5 MG tablet   Commonly known as: ATIVAN   Take 0.5 mg by mouth every 6 (six) hours as needed. For anxiety      metolazone 5 MG tablet   Commonly known as: ZAROXOLYN   Take 1 tablet (5 mg total) by mouth daily.      metoprolol tartrate 25 MG tablet   Commonly known as: LOPRESSOR   Take 1 tablet (25 mg total) by mouth 2 (two) times daily.      mirtazapine 15 MG tablet   Commonly known as: REMERON   Take 1 tablet (15 mg total) by mouth at bedtime.      nitroGLYCERIN 0.4 MG SL tablet   Commonly known as: NITROSTAT   Place 0.4  mg under the tongue every 5 (five) minutes as needed. May repeat for 2 doses as needed for chest pain.      omeprazole 40 MG capsule   Commonly known as: PRILOSEC   Take 40 mg by mouth daily. For GI bleed.      potassium chloride 10 MEQ tablet   Commonly known as: K-DUR   Take 1 tablet (10 mEq total) by mouth daily.      promethazine 12.5 MG tablet   Commonly known as: PHENERGAN   Take one tablet by mouth every six hours as needed for nausea, ok to crush      simvastatin 20 MG tablet   Commonly known as: ZOCOR   Take 1 tablet (20 mg total) by mouth every evening.      temazepam 7.5 MG capsule   Commonly known as: RESTORIL   Take 7.5 mg by mouth at bedtime as needed.      torsemide 20 MG tablet   Commonly known as: DEMADEX   Take 20 mg by mouth daily.              Consults:     SIGNIFICANT DIAGNOSTIC STUDIES:  Dg Chest 2 View  06/21/2011  *RADIOLOGY REPORT*  Clinical Data: Shortness of breath, weakness, hypertension,  diabetes, COPD, former smoker  CHEST - 2 VIEW  Comparison: 05/27/2011  Findings: Enlargement of cardiac silhouette post CABG. Atherosclerotic calcification aorta. Bone vascular congestion. Right middle lobe infiltrate. Decreased right pleural effusion. Underlying emphysematous changes. Improved pulmonary edema. No pneumothorax. Small left pleural effusion also noted. Bones appear diffusely demineralized with multiple thoracolumbar compression fractures.  IMPRESSION: Improved CHF with small bibasilar effusions and persistent right middle lobe infiltrate.  Original Report Authenticated By: Lollie Marrow, M.D.   Ct Angio Chest W/cm &/or Wo Cm  05/27/2011  *RADIOLOGY REPORT*  Clinical Data: Shortness of breath, back   pain, pleurisy.  History DVT.  CT ANGIOGRAPHY CHEST  Technique:  Multidetector CT imaging of the chest using the standard protocol during bolus administration of intravenous contrast. Multiplanar reconstructed images including MIPs were obtained and reviewed to evaluate the vascular anatomy.  Contrast: 80mL OMNIPAQUE IOHEXOL 300 MG/ML IV SOLN  Comparison: 09/07/2010  Findings: There is good contrast opacification of the pulmonary artery branches.  No discrete filling defect to suggest acute PE.Previous CABG. Good contrast opacification of the thoracic aorta with no evidence of dissection, aneurysm, or stenosis. There is classic 3-vessel brachiocephalic arch anatomy with calcified plaque at the origins.  Small left and moderate right pleural effusions.  There is biatrial cardiac enlargement.  Sub centimeter anterior mediastinal, prevascular, AP window, pretracheal, precarinal lymph nodes.  There is dependent atelectasis through all basilar segments of the right lower lobe and the dependent aspect of the left lower lobe.  There is reflux of contrast from the right atrium into the hepatic veins.  Remainder of visualized upper abdomen unremarkable. Compression deformities of T5 and T8 are stable.   IMPRESSION:  1.  Negative for acute PE or thoracic aortic dissection. 2.  Moderate right and small left pleural effusions with dependent atelectasis in the lower lobes.  Original Report Authenticated By: Osa Craver, M.D.   Dg Chest Portable 1 View  05/27/2011  *RADIOLOGY REPORT*  Clinical Data: Short of breath.  Back pain.  PORTABLE CHEST - 1 VIEW  Comparison: 02/08/2011  Findings: Artifact overlies chest.  There is been median sternotomy and CABG.  The heart is enlarged.  There is venous hypertension with  interstitial and early alveolar edema.  There are effusions with bilateral lower lobe volume loss.  IMPRESSION: Congestive heart failure.  Pulmonary edema with effusions and basilar volume loss.  Original Report Authenticated By: Thomasenia Sales, M.D.     ECHO:   - Left ventricle: Systolic function was normal. The estimated ejection fraction was in the range of 55% to 60%. - Mitral valve: Mild regurgitation. - Left atrium: The atrium was mildly dilated. - Right ventricle: The cavity size was moderately dilated. - Right atrium: The atrium was severely dilated. - Tricuspid valve: Moderate-severe regurgitation. - Pulmonary arteries: Systolic pressure was moderately to severely increased. PA peak pressure: 62mm Hg (S).   Recent Results (from the past 240 hour(s))  CULTURE, BLOOD (ROUTINE X 2)     Status: Normal (Preliminary result)   Collection Time   06/21/11  8:26 PM      Component Value Range Status Comment   Specimen Description BLOOD ARM RIGHT   Final    Special Requests     Final    Value: BOTTLES DRAWN AEROBIC AND ANAEROBIC AERO 10CC, ANAE 8CC   Culture  Setup Time [id number]   Final    Culture     Final    Value:        BLOOD CULTURE RECEIVED NO GROWTH TO DATE CULTURE WILL BE HELD FOR 5 DAYS BEFORE ISSUING A FINAL NEGATIVE REPORT   Report Status PENDING   Incomplete   CULTURE, BLOOD (ROUTINE X 2)     Status: Normal (Preliminary result)   Collection Time   06/21/11   9:07 PM      Component Value Range Status Comment   Specimen Description BLOOD HAND RIGHT   Final    Special Requests     Final    Value: BOTTLES DRAWN AEROBIC AND ANAEROBIC AERO 10CC,ANAE 8CC   Culture  Setup Time [id number]   Final    Culture     Final    Value:        BLOOD CULTURE RECEIVED NO GROWTH TO DATE CULTURE WILL BE HELD FOR 5 DAYS BEFORE ISSUING A FINAL NEGATIVE REPORT   Report Status PENDING   Incomplete   CULTURE, SPUTUM-ASSESSMENT     Status: Normal   Collection Time   06/22/11 12:48 AM      Component Value Range Status Comment   Specimen Description SPUTUM   Final    Special Requests Normal   Final    Sputum evaluation     Final    Value: THIS SPECIMEN IS ACCEPTABLE. RESPIRATORY CULTURE REPORT TO FOLLOW.   Report Status 06/22/2011 FINAL   Final   CULTURE, RESPIRATORY     Status: Normal   Collection Time   06/22/11 12:48 AM      Component Value Range Status Comment   Specimen Description SPUTUM   Final    Special Requests NONE   Final    Gram Stain     Final    Value: MODERATE WBC PRESENT,BOTH PMN AND MONONUCLEAR     RARE SQUAMOUS EPITHELIAL CELLS PRESENT     MODERATE GRAM POSITIVE COCCI IN PAIRS     FEW GRAM NEGATIVE COCCI   Culture     Final    Value: ABUNDANT MORAXELLA CATARRHALIS(BRANHAMELLA)     Note: BETA LACTAMASE POSITIVE   Report Status 06/24/2011 FINAL   Final   MRSA PCR SCREENING     Status: Normal   Collection Time   06/23/11  4:38 AM  Component Value Range Status Comment   MRSA by PCR NEGATIVE  NEGATIVE  Final     BRIEF ADMITTING H & P: Patient is a 78 year old Caucasian female with multiple medical problems including history of CHF, hypertension, hyperlipidemia, COPD followed by home hospice presented with increasing fever, shortness of breath and increased swelling in the legs worsening in the last 3 days. Patient states that she's been having productive cough with yellowish sputum. Patient states that she sleeps on 2 pillow however in the ED,  patient was sitting upright as she was unable to breathe on lying flat. She otherwise denies any chest pain, palpitations, diaphoresis.    Hospital Course:  Present on Admission:  .Community acquired pneumonia: Patient presented with a clinical syndrome and radiologic findings consistent with a community acquired pneumonia. She was started on IV Levaquin and transition to oral Levaquin. The patient is to continue her Levaquin for an additional 5 days to complete a total of 7 days.  Marland KitchenCOPD exacerbation: The patient exhibited COPD exacerbation likely in response to her pneumonia. She was treated with 8 agonists and steroid. The oral steroids would discontinue the patient is continued on her inhaled Pulmicort.   .CHF, acute on chronic diastolic dysfunction: The patient has known chronic diastolic heart failure. She has been diuresed with a sentinel a daily basis. At this time the patient's Zaroxolyn has been increased to 5 mg daily and she is resumed on her oral torsemide. She should have daily weights and acute treatment instituted if she has an increase in 3 pounds over a week or 1 pound over 2 days.   .Chronic mesenteric ischemia: Patient has chronic mesenteric ischemia which initially had caused her to be admitted to hospice. She's had significant weight loss associated with the with anemia and her decreased oral intake. In light of this her diet was liberalized so that she is able to improve her calorie intake. And hopefully she will avoid much more weight loss in the future.   .Atrial fibrillation: Heart rate is well controlled in the 80s on metoprolol. She's to continue. The patient is not a candidate for Coumadin.  . Pulmonary hypertension: Patient has moderately severe pulmonary hypertension with a peak pulmonary artery pressure of 62 mmHg. She will likely require oxygen indefinitely. Please note that prior to hospitalization the patient was on 2 L of oxygen at home.   . Renal Artery Stenosis:  patient has renal artery stenosis and does ACE inhibitor should be avoided in this patient   .CKD (chronic kidney disease) stage 4, GFR 15-29 ml/min: In November of last the patient had a GFR of 31. At the time of discharge the patient has a GFR of 52%.   . Deconditioning: The patient has significant deconditioning. Physical therapy see the patient and have recommended daily physical therapy and occupational therapy for this patient. The patient thus is being transferred to ask him to a skilled nursing facility for further rehabilitation with long-term goal of being able to return home to her husband.    Disposition and Follow-up:  Patient is being transferred to [facility] skilled nursing facility. She should receive physical therapy and occupational therapy while there. The expectation is that she will be evaluated by the primary care physician within 72 hours of arriving at the skilled nursing facility.  Discharge Orders    Future Orders Please Complete By Expires   Diet general      Increase activity slowly         DISCHARGE  EXAM:  General: Alert, awake, oriented x3, in no acute distress.  Vital Signs: Blood pressure 153/58, pulse 101, temperature 98.4 F (36.9 C), temperature source Oral, resp. rate 22, height 5\' 5"  (1.651 m), weight 66.4 kg (146 lb 6.2 oz), SpO2 91.00%. HEENT: [place]/AT PEERL, EOMI  Neck: Trachea midline, no masses, no thyromegal,y no JVD, no carotid bruit  OROPHARYNX: Moist, No exudate/ erythema/lesions.  Heart: Regular rate and rhythm, without murmurs, rubs, gallops, PMI non-displaced, no heaves or thrills on palpation.  Lungs: Clear to auscultation, no wheezing or rhonchi noted. No increased vocal fremitus resonant to percussion  Abdomen: Soft, nontender, nondistended, positive bowel sounds, no masses no hepatosplenomegaly noted.  Extremities: Pt has1+ pitting edema to the bilateral lower extremities     Basename 06/25/11 0524 06/24/11 0615  NA 139 136  K 4.3  4.5  CL 90* 87*  CO2 45* 44*  GLUCOSE 96 99  BUN 19 19  CREATININE 1.02 1.00  CALCIUM 8.2* 8.3*  MG -- --  PHOS -- --   No results found for this basename: AST:2,ALT:2,ALKPHOS:2,BILITOT:2,PROT:2,ALBUMIN:2 in the last 72 hours No results found for this basename: LIPASE:2,AMYLASE:2 in the last 72 hours  Basename 06/24/11 0615  WBC 3.6*  NEUTROABS 2.8  HGB 9.1*  HCT 29.1*  MCV 93.3  PLT 123*    Signed: Glen Kesinger A. 06/25/2011, 1:47 PM

## 2011-06-25 NOTE — Progress Notes (Addendum)
Clinical Social Work Department CLINICAL SOCIAL WORK PLACEMENT NOTE 06/25/2011  Patient:  Donna Reese, Donna Reese  Account Number:  [id number] Admit date:  06/21/2011  Clinical Social Worker:  Unk Lightning, LCSW  Date/time:  06/25/2011 01:00 PM  Clinical Social Work is seeking post-discharge placement for this patient at the following level of care:   SKILLED NURSING   (*CSW will update this form in Epic as items are completed)   06/24/2011  Patient/family provided with [facility] Department of Clinical Social Work's list of facilities offering this level of care within the geographic area requested by the patient (or if unable, by the patient's family).  06/24/2011  Patient/family informed of their freedom to choose among providers that offer the needed level of care, that participate in Medicare, Medicaid or managed care program needed by the patient, have an available bed and are willing to accept the patient.  06/24/2011  Patient/family informed of [facility]' ownership interest in [facility], as well as of the fact that they are under no obligation to receive care at this facility.  PASARR submitted to [employer] on 07/05/2008 PASARR number received from [employer] on 07/05/2008  FL2 transmitted to all facilities in geographic area requested by pt/family on  06/24/2011 FL2 transmitted to all facilities within larger geographic area on   Patient informed that his/her managed care company has contracts with or will negotiate with  certain facilities, including the following:     Patient/family informed of bed offers received:  06/25/2011 Patient chooses bed at [facility] Physician recommends and patient chooses bed at    Patient to be transferred to  on  06/25/2011 Patient to be transferred to facility by [facility]  The following physician request were entered in Epic:   Additional Comments:

## 2011-06-26 ENCOUNTER — Telehealth: Payer: Self-pay | Admitting: Family Medicine

## 2011-06-26 NOTE — Telephone Encounter (Signed)
[name] Nurse called and wanted to update you on Donna Reese's Status. She is now back at [facility] and out of the hospital. She is no longer under the [facility] but under the Medicare Skilled living services.

## 2011-06-26 NOTE — Telephone Encounter (Signed)
Noted, d/w patient's granddaughter and she will confer with her mother. [facility] may have a staff MD and they will check and get back to me

## 2011-06-28 LAB — CULTURE, BLOOD (ROUTINE X 2)
Culture  Setup Time: 201303230132
Culture: NO GROWTH

## 2011-07-26 NOTE — ED Provider Notes (Signed)
Medical screening examination/treatment/procedure(s) were conducted as a shared visit with non-physician practitioner(s) and myself.  I personally evaluated the patient during the encounter  Toy Baker, MD 07/26/11 [phone]

## 2011-09-10 ENCOUNTER — Other Ambulatory Visit: Payer: Self-pay | Admitting: Cardiothoracic Surgery

## 2011-09-10 DIAGNOSIS — I2510 Atherosclerotic heart disease of native coronary artery without angina pectoris: Secondary | ICD-10-CM

## 2011-09-12 ENCOUNTER — Ambulatory Visit (INDEPENDENT_AMBULATORY_CARE_PROVIDER_SITE_OTHER): Payer: Medicare Other | Admitting: Cardiothoracic Surgery

## 2011-09-12 ENCOUNTER — Ambulatory Visit
Admission: RE | Admit: 2011-09-12 | Discharge: 2011-09-12 | Disposition: A | Discharge status: Home / Self Care | Payer: Medicare Other | Source: Ambulatory Visit | Attending: Cardiothoracic Surgery | Admitting: Cardiothoracic Surgery

## 2011-09-12 VITALS — BP 131/77 | HR 76 | Temp 98.4°F | Resp 16

## 2011-09-12 DIAGNOSIS — I2510 Atherosclerotic heart disease of native coronary artery without angina pectoris: Secondary | ICD-10-CM

## 2011-09-12 DIAGNOSIS — Z951 Presence of aortocoronary bypass graft: Secondary | ICD-10-CM

## 2011-09-12 DIAGNOSIS — L03319 Cellulitis of trunk, unspecified: Secondary | ICD-10-CM

## 2011-09-12 DIAGNOSIS — Z5189 Encounter for other specified aftercare: Secondary | ICD-10-CM

## 2011-09-12 DIAGNOSIS — L02219 Cutaneous abscess of trunk, unspecified: Secondary | ICD-10-CM

## 2011-09-12 NOTE — Progress Notes (Signed)
[address]            [place] [address]          [phone]      Donna Reese [facility] Medical Record #[id number] Date of Birth: 1931/11/28  Referring: Kimber Relic, MD Primary Care: Hannah Beat, MD  Chief Complaint:    Chief Complaint  Patient presents with  . Wound Check    CXR, c/o exposed sternal wire, S/P CABG in 08/2001    History of Present Illness:    Patient had coronary artery bypass grafting in 2003, she is now severely incapacitated and living in a nursing facility. For the past months he's had increasing pain over the lower portion of her sternal incision, now there is a protruding sternal wire through the skin with surrounding erythema. she present to the office for treatment      Current Activity/ Functional Status: Patient is not independent with mobility/ambulation, transfers, ADL's, IADL's.   Past Medical History  Diagnosis Date  . Diabetes mellitus   . Diverticulosis   . Osteopenia   . PUD (peptic ulcer disease)   . Peripheral arterial disease   . Hypertension   . Hyperlipidemia   . Diastolic heart failure     a. echo 9/11: EF 60-65%, mild LVH, grade 2 diast dysfxn, AV mean gradient 9 mmHg, mild MR, RVE, RVSP 59  . Coronary artery disease     a. s/p CABG;  b. cath 8/09: LM occluded; RCA 50%; L-LAD ok, Radial-OM ok; bilat renal art stenosis  . Renovascular hypertension   . Renal artery stenosis     a. s/p L RA stent 8/09  . Paroxysmal a-fib     no coumadin 2/2 GI Blee  . DVT (deep venous thrombosis)   . Pulmonary embolus     s/p IVC filter  . History of gastrointestinal bleeding     no ASA or coumadin  . Contrast dye induced nephropathy   . Orthostatic hypotension   . CHF (congestive heart failure)   . COPD (chronic obstructive pulmonary disease)   . Mesenteric ischemia, chronic   . PONV (postoperative nausea and vomiting)   . Myocardial infarction   . Angina   . Dysrhythmia   . Heart  murmur   . Asthma   . Shortness of breath   . Recurrent upper respiratory infection (URI)   . Pneumonia   . Anemia   . Blood transfusion   . H/O hiatal hernia   . Headache   . Arthritis   . Mental disorder   . Anxiety   . Depression     Past Surgical History  Procedure Date  . Appendectomy   . Cholecystectomy   . Abdominal hysterectomy   . Coronary artery bypass graft 09-04-2001    carotid u/s unchnaged 40-50% ICA stenosis 12/1998  . Cataract extraction, bilateral 2001  . Abdominal aortic aneurysm repair 1997  . Nm myoview ltd     neg EF 76%--06/26/2004  . Cardiac catheterization 09-03-01  . Esophagogastroduodenoscopy     abnormal 2007, esophageal stricture 10-12-03-07 gastritis and major bleef  . Vena cava filter placement 07-2007    Dr. Excell Seltzer  . Renal artery doppler 11-17-2008    severe iliac stenosis  . Tonsillectomy   . Breast surgery   . Hernia repair     Family History  Problem Relation Age of  Onset  . Cancer Mother   . Stroke Father   . Stroke Brother   . Heart attack Brother   . Heart attack Brother   . Heart attack Brother     History   Social History  . Marital Status: Married    Spouse Name: N/A    Number of Children: N/A  . Years of Education: N/A   Occupational History  . Not on file.   Social History Main Topics  . Smoking status: Former Smoker -- 1.0 packs/day for 40 years    Types: Cigarettes    Quit date: 04/01/1990  . Smokeless tobacco: Not on file  . Alcohol Use: No  . Drug Use: No  . Sexually Active: Not Currently   Other Topics Concern  . Not on file   Social History Narrative  . No narrative on file    History  Smoking status  . Former Smoker -- 1.0 packs/day for 40 years  . Types: Cigarettes  . Quit date: 04/01/1990  Smokeless tobacco  . Not on file    History  Alcohol Use No     Allergies  Allergen Reactions  . Meperidine Hcl   . Niacin     REACTION: rash  . Penicillins   . Sulfonamide Derivatives   .  Codeine Rash  . Morphine Rash  . Propoxyphene Hcl Rash    Current Outpatient Prescriptions  Medication Sig Dispense Refill  . acetaminophen (TYLENOL) 500 MG tablet Take 1,000 mg by mouth every 4 (four) hours as needed. Pain.      Marland Kitchen albuterol (PROVENTIL HFA;VENTOLIN HFA) 108 (90 BASE) MCG/ACT inhaler Inhale 2 puffs into the lungs every 6 (six) hours as needed. Shortness of breath      . albuterol (PROVENTIL) (2.5 MG/3ML) 0.083% nebulizer solution Take 2.5 mg by nebulization every 6 (six) hours as needed. Dyspnea.      . budesonide (PULMICORT) 0.25 MG/2ML nebulizer solution Take 2 mLs (0.25 mg total) by nebulization 2 (two) times daily.  60 mL  0  . calcium carbonate (TUMS - DOSED IN MG ELEMENTAL CALCIUM) 500 MG chewable tablet Chew 1 tablet by mouth every 4 (four) hours as needed. indigestion      . citalopram (CELEXA) 10 MG tablet Take 10 mg by mouth daily.      . colchicine 0.6 MG tablet Take 0.6 mg by mouth daily.      Marland Kitchen doxycycline (VIBRAMYCIN) 100 MG capsule Take 100 mg by mouth 2 (two) times daily. X  10 days, started on 09/10/2011 for sternal wound      . isosorbide mononitrate (IMDUR) 30 MG 24 hr tablet Take 1 tablet (30 mg total) by mouth daily.  30 tablet  11  . latanoprost (XALATAN) 0.005 % ophthalmic solution Place 1 drop into both eyes at bedtime.       Marland Kitchen LORazepam (ATIVAN) 0.5 MG tablet Take 1 tablet (0.5 mg total) by mouth every 6 (six) hours as needed. For anxiety  30 tablet  0  . metolazone (ZAROXOLYN) 5 MG tablet Take 1 tablet (5 mg total) by mouth daily.  30 tablet  0  . metoprolol tartrate (LOPRESSOR) 25 MG tablet Take 1 tablet (25 mg total) by mouth 2 (two) times daily.  60 tablet  6  . mirtazapine (REMERON) 15 MG tablet Take 1 tablet (15 mg total) by mouth at bedtime.  30 tablet  5  . nitroGLYCERIN (NITROSTAT) 0.4 MG SL tablet Place 0.4 mg under the tongue every 5 (  five) minutes as needed. May repeat for 2 doses as needed for chest pain.      Marland Kitchen omeprazole (PRILOSEC) 40 MG  capsule Take 40 mg by mouth daily. For GI bleed.      . potassium chloride (KLOR-CON 10) 10 MEQ tablet Take 1 tablet (10 mEq total) by mouth daily.  30 tablet  5  . promethazine (PHENERGAN) 12.5 MG tablet Take one tablet by mouth every six hours as needed for nausea, ok to crush  50 tablet  2  . saccharomyces boulardii (FLORASTOR) 250 MG capsule Take 250 mg by mouth 2 (two) times daily.      Marland Kitchen senna (SENOKOT) 8.6 MG tablet Take 1 tablet by mouth daily.      . simvastatin (ZOCOR) 20 MG tablet Take 1 tablet (20 mg total) by mouth every evening.  30 tablet  6  . temazepam (RESTORIL) 7.5 MG capsule Take 1 capsule (7.5 mg total) by mouth at bedtime as needed.  30 capsule  0  . torsemide (DEMADEX) 20 MG tablet Take 20 mg by mouth daily.      Marland Kitchen DISCONTD: amLODipine (NORVASC) 10 MG tablet Take 5 mg by mouth daily.           Review of Systems:     Cardiac Review of Systems: Y or N  Chest Pain [ y   ]  Resting SOB [  y ] Exertional SOB  Cove.Etienne  ]  Orthopnea Cove.Etienne  ]   Pedal Edema [  y ]    Palpitations [n  ] Syncope  [  n]   Presyncope n[   ]  General Review of Systems: [Y] = yes [  ]=no Constitional: recent weight change [  ]; anorexia [  ]; fatigue [  ]; nausea [  ]; night sweats [  ]; fever [  ]; or chills [  ];                                                                                                                                          Dental: poor dentition[  ]; Last Dentist visit:   Eye : blurred vision [  ]; diplopia [   ]; vision changes [  ];  Amaurosis fugax[  ]; Resp: cough [  ];  wheezing[  ];  hemoptysis[  ]; shortness of breath[  ]; paroxysmal nocturnal dyspnea[  ]; dyspnea on exertion[  ]; or orthopnea[  ];  GI:  gallstones[  ], vomiting[  ];  dysphagia[  ]; melena[  ];  hematochezia [  ]; heartburn[  ];   Hx of  Colonoscopy[  ]; GU: kidney stones [  ]; hematuria[  ];   dysuria [  ];  nocturia[  ];  history of     obstruction [  ];  Skin: rash, swelling[  ];, hair loss[   ];  peripheral edema[  ];  or itching[  ]; Musculosketetal: myalgias[  ];  joint swelling[  ];  joint erythema[  ];  joint pain[  ];  back pain[  ];  Heme/Lymph: bruising[  ];  bleeding[  ];  anemia[  ];  Neuro: TIA[  ];  headaches[  ];  stroke[  ];  vertigo[  ];  seizures[  ];   paresthesias[  ];  difficulty walking[  ];  Psych:depression[  ]; anxiety[  ];  Endocrine: diabetes[  ];  thyroid dysfunction[  ];  Immunizations: Flu [  ?]; Pneumococcal[?  ];  Other:  Physical Exam: BP 131/77  Pulse 76  Temp 98.4 F (36.9 C) (Oral)  Resp 16  SpO2 95%  General appearance: alert, appears older than stated age, cachectic, distracted, fatigued and slowed mentation Neurologic: intact Heart: regular rate and rhythm, S1, S2 normal, no murmur, click, rub or gallop and normal apical impulse Lungs: clear to auscultation bilaterally and normal percussion bilaterally Abdomen: soft, non-tender; bowel sounds normal; no masses,  no organomegaly Extremities: edema Mild bilateral pedal edema In the lower third of the sternum there is a protruding sternal wire through the skin with surrounding erythema very tender to touch   Diagnostic Studies & Laboratory data:     Recent Radiology Findings:   Dg Chest 2 View  09/12/2011  *RADIOLOGY REPORT*  Clinical Data: Status post CABG.  CHEST - 2 VIEW  Comparison: PA and lateral chest 06/21/2011.  Findings: There is cardiomegaly.  Small bilateral pleural effusions noted, marked change.  No pulmonary edema or consolidative process.  IMPRESSION: Cardiomegaly and small bilateral pleural effusions.  Original Report Authenticated By: Bernadene Bell. D'ALESSIO, M.D.   there are no broken sternal wires on x-ray one of the lower sternal wires which correlates with the exposed wire as the tip in through the skin    Recent Lab Findings: Lab Results  Component Value Date   WBC 3.6* 06/24/2011   HGB 9.1* 06/24/2011   HCT 29.1* 06/24/2011   PLT 123* 06/24/2011   GLUCOSE 96  06/25/2011   CHOL 131 02/09/2011   TRIG 118 02/09/2011   HDL 48 02/09/2011   LDLCALC 59 02/09/2011   ALT 21 06/21/2011   AST 20 06/21/2011   NA 139 06/25/2011   K 4.3 06/25/2011   CL 90* 06/25/2011   CREATININE 1.02 06/25/2011   BUN 19 06/25/2011   CO2 45* 06/25/2011   TSH 1.577 02/08/2011   INR 1.27 05/27/2011   HGBA1C 5.1 02/08/2011   After signing informed consent and timeout procedure the patient's chest was prepped with Betadine and 1% lidocaine was infiltrated around the base of the sternal wire, a sternal wire was divided and easily pulled out. The patient tolerated procedure without difficulty. And without significant bleeding.  Assessment / Plan:    Painful sternal wire with protrusion through the skin. Now removed in the office under local anesthesia and instructions for wound care were sent to the nursing facility. She will return when necessary.      Delight Ovens MD  Beeper [phone] Office [phone] 09/12/2011 4:59 PM

## 2011-09-12 NOTE — Patient Instructions (Addendum)
Protruding sternal wire removed Clean small wound with dilute H202 daily and apply dry dressing Return if increased redness or evidence of infection Continue antibiotics as you have already started for 7 days      Incision Care An incision is when a surgeon cuts into your body tissues. After surgery, the incision needs to be cared for properly to prevent infection.   HOME CARE INSTRUCTIONS    Take all medicine as directed by your caregiver. Only take over-the-counter or prescription medicines for pain, discomfort, or fever as directed by your caregiver.   Do not remove your bandage (dressing) or get your incision wet until your surgeon gives you permission. In the event that your dressing becomes wet, dirty, or starts to smell, change the dressing and call your surgeon for instructions as soon as possible.   Take showers. Do not take tub baths, swim, or do anything that may soak the wound until it is healed.   Resume your normal diet and activities as directed or allowed.   Avoid lifting any weight until you are instructed otherwise.   Use anti-itch antihistamine medicine as directed by your caregiver. The wound may itch when it is healing. Do not pick or scratch at the wound.   Follow up with your caregiver for stitch (suture) or staple removal as directed.   Drink enough fluids to keep your urine clear or pale yellow.  SEEK MEDICAL CARE IF:    You have redness, swelling, or increasing pain in the wound that is not controlled with medicine.   You have drainage, blood, or pus coming from the wound that lasts longer than 1 day.   You develop muscle aches, chills, or a general ill feeling.   You notice a bad smell coming from the wound or dressing.   Your wound edges separate after the sutures, staples, or skin adhesive strips have been removed.   You develop persistent nausea or vomiting.  SEEK IMMEDIATE MEDICAL CARE IF:    You have a fever.   You develop a rash.   You  develop dizzy episodes or faint while standing.   You have difficulty breathing.   You develop any reaction or side effects to medicine given.  MAKE SURE YOU:    Understand these instructions.   Will watch your condition.   Will get help right away if you are not doing well or get worse.  Document Released: 10/05/2004 Document Revised: 03/07/2011 Document Reviewed: 07/22/2010 [facility] Patient Information 2012 [place], [place].

## 2012-07-06 ENCOUNTER — Other Ambulatory Visit: Payer: Self-pay | Admitting: *Deleted

## 2012-07-18 ENCOUNTER — Emergency Department (HOSPITAL_COMMUNITY)
Admission: EM | Admit: 2012-07-18 | Discharge: 2012-07-18 | Disposition: A | Discharge status: Home / Self Care | Attending: Emergency Medicine | Admitting: Emergency Medicine

## 2012-07-18 ENCOUNTER — Emergency Department (HOSPITAL_COMMUNITY)

## 2012-07-18 ENCOUNTER — Encounter (HOSPITAL_COMMUNITY): Payer: Self-pay | Admitting: Cardiology

## 2012-07-18 DIAGNOSIS — I509 Heart failure, unspecified: Secondary | ICD-10-CM

## 2012-07-18 DIAGNOSIS — J449 Chronic obstructive pulmonary disease, unspecified: Secondary | ICD-10-CM | POA: Insufficient documentation

## 2012-07-18 DIAGNOSIS — Z8669 Personal history of other diseases of the nervous system and sense organs: Secondary | ICD-10-CM | POA: Insufficient documentation

## 2012-07-18 DIAGNOSIS — Z8719 Personal history of other diseases of the digestive system: Secondary | ICD-10-CM | POA: Insufficient documentation

## 2012-07-18 DIAGNOSIS — R079 Chest pain, unspecified: Secondary | ICD-10-CM

## 2012-07-18 DIAGNOSIS — Z862 Personal history of diseases of the blood and blood-forming organs and certain disorders involving the immune mechanism: Secondary | ICD-10-CM | POA: Insufficient documentation

## 2012-07-18 DIAGNOSIS — I252 Old myocardial infarction: Secondary | ICD-10-CM | POA: Insufficient documentation

## 2012-07-18 DIAGNOSIS — R0789 Other chest pain: Secondary | ICD-10-CM | POA: Insufficient documentation

## 2012-07-18 DIAGNOSIS — E119 Type 2 diabetes mellitus without complications: Secondary | ICD-10-CM | POA: Insufficient documentation

## 2012-07-18 DIAGNOSIS — J4489 Other specified chronic obstructive pulmonary disease: Secondary | ICD-10-CM | POA: Insufficient documentation

## 2012-07-18 DIAGNOSIS — I5030 Unspecified diastolic (congestive) heart failure: Secondary | ICD-10-CM | POA: Insufficient documentation

## 2012-07-18 DIAGNOSIS — Z8679 Personal history of other diseases of the circulatory system: Secondary | ICD-10-CM | POA: Insufficient documentation

## 2012-07-18 DIAGNOSIS — Z8659 Personal history of other mental and behavioral disorders: Secondary | ICD-10-CM | POA: Insufficient documentation

## 2012-07-18 DIAGNOSIS — Z79899 Other long term (current) drug therapy: Secondary | ICD-10-CM | POA: Insufficient documentation

## 2012-07-18 DIAGNOSIS — Z87891 Personal history of nicotine dependence: Secondary | ICD-10-CM | POA: Insufficient documentation

## 2012-07-18 DIAGNOSIS — Z8701 Personal history of pneumonia (recurrent): Secondary | ICD-10-CM | POA: Insufficient documentation

## 2012-07-18 DIAGNOSIS — Z8739 Personal history of other diseases of the musculoskeletal system and connective tissue: Secondary | ICD-10-CM | POA: Insufficient documentation

## 2012-07-18 DIAGNOSIS — F3289 Other specified depressive episodes: Secondary | ICD-10-CM | POA: Insufficient documentation

## 2012-07-18 DIAGNOSIS — F411 Generalized anxiety disorder: Secondary | ICD-10-CM | POA: Insufficient documentation

## 2012-07-18 DIAGNOSIS — Z8711 Personal history of peptic ulcer disease: Secondary | ICD-10-CM | POA: Insufficient documentation

## 2012-07-18 DIAGNOSIS — M899 Disorder of bone, unspecified: Secondary | ICD-10-CM | POA: Insufficient documentation

## 2012-07-18 DIAGNOSIS — Z86718 Personal history of other venous thrombosis and embolism: Secondary | ICD-10-CM | POA: Insufficient documentation

## 2012-07-18 DIAGNOSIS — I209 Angina pectoris, unspecified: Secondary | ICD-10-CM | POA: Insufficient documentation

## 2012-07-18 DIAGNOSIS — F329 Major depressive disorder, single episode, unspecified: Secondary | ICD-10-CM | POA: Insufficient documentation

## 2012-07-18 DIAGNOSIS — Z8709 Personal history of other diseases of the respiratory system: Secondary | ICD-10-CM | POA: Insufficient documentation

## 2012-07-18 DIAGNOSIS — Z951 Presence of aortocoronary bypass graft: Secondary | ICD-10-CM | POA: Insufficient documentation

## 2012-07-18 DIAGNOSIS — I10 Essential (primary) hypertension: Secondary | ICD-10-CM | POA: Insufficient documentation

## 2012-07-18 DIAGNOSIS — I2510 Atherosclerotic heart disease of native coronary artery without angina pectoris: Secondary | ICD-10-CM | POA: Insufficient documentation

## 2012-07-18 DIAGNOSIS — R011 Cardiac murmur, unspecified: Secondary | ICD-10-CM | POA: Insufficient documentation

## 2012-07-18 DIAGNOSIS — Z87448 Personal history of other diseases of urinary system: Secondary | ICD-10-CM | POA: Insufficient documentation

## 2012-07-18 DIAGNOSIS — E785 Hyperlipidemia, unspecified: Secondary | ICD-10-CM | POA: Insufficient documentation

## 2012-07-18 LAB — CBC WITH DIFFERENTIAL/PLATELET
Basophils Absolute: 0 10*3/uL (ref 0.0–0.1)
Basophils Relative: 0 % (ref 0–1)
Eosinophils Absolute: 0.2 10*3/uL (ref 0.0–0.7)
Hemoglobin: 10 g/dL — ABNORMAL LOW (ref 12.0–15.0)
MCH: 29.8 pg (ref 26.0–34.0)
MCHC: 30.9 g/dL (ref 30.0–36.0)
Monocytes Absolute: 0.3 10*3/uL (ref 0.1–1.0)
Monocytes Relative: 4 % (ref 3–12)
Neutro Abs: 4.7 10*3/uL (ref 1.7–7.7)
Neutrophils Relative %: 77 % (ref 43–77)
RDW: 15.1 % (ref 11.5–15.5)

## 2012-07-18 LAB — BASIC METABOLIC PANEL
BUN: 31 mg/dL — ABNORMAL HIGH (ref 6–23)
Creatinine, Ser: 0.87 mg/dL (ref 0.50–1.10)
GFR calc Af Amer: 72 mL/min — ABNORMAL LOW (ref >90)
GFR calc non Af Amer: 62 mL/min — ABNORMAL LOW (ref >90)
Potassium: 3.4 mEq/L — ABNORMAL LOW (ref 3.5–5.1)

## 2012-07-18 LAB — POCT I-STAT TROPONIN I: Troponin i, poc: 0.02 ng/mL (ref 0.00–0.08)

## 2012-07-18 MED ORDER — NITROGLYCERIN 0.4 MG SL SUBL: 0.4000 mg | SUBLINGUAL_TABLET | SUBLINGUAL | Status: DC | PRN

## 2012-07-18 MED ORDER — ACETAMINOPHEN 325 MG PO TABS
650.0000 mg | ORAL_TABLET | Freq: Once | ORAL | Status: AC
  Administered 2012-07-18: 650 mg via ORAL
  Filled 2012-07-18: qty 2

## 2012-07-18 NOTE — ED Notes (Signed)
Provider informed about CO2 level.

## 2012-07-18 NOTE — ED Provider Notes (Signed)
History     CSN: [id number]  Arrival date & time 07/18/12  1631   First MD Initiated Contact with Patient 07/18/12 1632      Chief Complaint  Patient presents with  . Chest Pain    (Consider location/radiation/quality/duration/timing/severity/associated sxs/prior treatment) Patient is a 79 y.o. female presenting with chest pain. The history is provided by the patient, the EMS personnel and medical records.  Chest Pain Pain location:  L chest Pain quality: aching and burning   Pain radiates to:  Does not radiate Pain radiates to the back: no   Pain severity:  Mild Onset quality:  Gradual Duration:  2 days Timing:  Intermittent Progression:  Waxing and waning Chronicity:  Recurrent Context: movement   Context: not breathing and not eating   Associated symptoms: no abdominal pain, no cough, no fever, no palpitations, no shortness of breath and not vomiting   Risk factors comment:  CAD (s/p CABG), COPD   Past Medical History  Diagnosis Date  . Diabetes mellitus   . Diverticulosis   . Osteopenia   . PUD (peptic ulcer disease)   . Peripheral arterial disease   . Hypertension   . Hyperlipidemia   . Diastolic heart failure     a. echo 9/11: EF 60-65%, mild LVH, grade 2 diast dysfxn, AV mean gradient 9 mmHg, mild MR, RVE, RVSP 59  . Coronary artery disease     a. s/p CABG;  b. cath 8/09: LM occluded; RCA 50%; L-LAD ok, Radial-OM ok; bilat renal art stenosis  . Renovascular hypertension   . Renal artery stenosis     a. s/p L RA stent 8/09  . Paroxysmal a-fib     no coumadin 2/2 GI Blee  . DVT (deep venous thrombosis)   . Pulmonary embolus     s/p IVC filter  . History of gastrointestinal bleeding     no ASA or coumadin  . Contrast dye induced nephropathy   . Orthostatic hypotension   . CHF (congestive heart failure)   . COPD (chronic obstructive pulmonary disease)   . Mesenteric ischemia, chronic   . PONV (postoperative nausea and vomiting)   . Myocardial  infarction   . Angina   . Dysrhythmia   . Heart murmur   . Asthma   . Shortness of breath   . Recurrent upper respiratory infection (URI)   . Pneumonia   . Anemia   . Blood transfusion   . H/O hiatal hernia   . Headache   . Arthritis   . Mental disorder   . Anxiety   . Depression     Past Surgical History  Procedure Laterality Date  . Appendectomy    . Cholecystectomy    . Abdominal hysterectomy    . Coronary artery bypass graft  09-04-2001    carotid u/s unchnaged 40-50% ICA stenosis 12/1998  . Cataract extraction, bilateral  2001  . Abdominal aortic aneurysm repair  1997  . Nm myoview ltd      neg EF 76%--06/26/2004  . Cardiac catheterization  09-03-01  . Esophagogastroduodenoscopy      abnormal 2007, esophageal stricture 10-12-03-07 gastritis and major bleef  . Vena cava filter placement  07-2007    Dr. Excell Seltzer  . Renal artery doppler  11-17-2008    severe iliac stenosis  . Tonsillectomy    . Breast surgery    . Hernia repair      Family History  Problem Relation Age of Onset  . Cancer Mother   .  Stroke Father   . Stroke Brother   . Heart attack Brother   . Heart attack Brother   . Heart attack Brother     History  Substance Use Topics  . Smoking status: Former Smoker -- 1.00 packs/day for 40 years    Types: Cigarettes    Quit date: 04/01/1990  . Smokeless tobacco: Not on file  . Alcohol Use: No    OB History   Grav Para Term Preterm Abortions TAB SAB Ect Mult Living                  Review of Systems  Constitutional: Negative for fever, chills, activity change and appetite change.  HENT: Negative for neck pain and neck stiffness.   Respiratory: Negative for cough, chest tightness, shortness of breath and wheezing.   Cardiovascular: Positive for chest pain (overlying left chest). Negative for palpitations and leg swelling.  Gastrointestinal: Negative for vomiting, abdominal pain, diarrhea and constipation.  Genitourinary: Negative for dysuria,  decreased urine volume and difficulty urinating.  Skin: Negative for rash and wound.  Neurological: Negative for syncope and light-headedness.  Psychiatric/Behavioral: Negative for behavioral problems, confusion and agitation.  All other systems reviewed and are negative.    Allergies  Meperidine hcl; Niacin; Penicillins; Sulfonamide derivatives; Codeine; Morphine; and Propoxyphene hcl  Home Medications   Current Outpatient Rx  Name  Route  Sig  Dispense  Refill  . acetaminophen (TYLENOL) 500 MG tablet   Oral   Take 1,000 mg by mouth every 4 (four) hours as needed. Pain.         Marland Kitchen albuterol (PROVENTIL HFA;VENTOLIN HFA) 108 (90 BASE) MCG/ACT inhaler   Inhalation   Inhale 2 puffs into the lungs every 6 (six) hours as needed. Shortness of breath         . albuterol (PROVENTIL) (2.5 MG/3ML) 0.083% nebulizer solution   Nebulization   Take 2.5 mg by nebulization every 6 (six) hours as needed. Dyspnea.         . calcium carbonate (TUMS - DOSED IN MG ELEMENTAL CALCIUM) 500 MG chewable tablet   Oral   Chew 1 tablet by mouth every 4 (four) hours as needed. indigestion         . citalopram (CELEXA) 10 MG tablet   Oral   Take 10 mg by mouth daily.         . colchicine 0.6 MG tablet   Oral   Take 0.6 mg by mouth daily.         Marland Kitchen doxycycline (VIBRAMYCIN) 100 MG capsule   Oral   Take 100 mg by mouth 2 (two) times daily. X  10 days, started on 09/10/2011 for sternal wound         . isosorbide mononitrate (IMDUR) 30 MG 24 hr tablet   Oral   Take 1 tablet (30 mg total) by mouth daily.   30 tablet   11   . latanoprost (XALATAN) 0.005 % ophthalmic solution   Both Eyes   Place 1 drop into both eyes at bedtime.          Marland Kitchen LORazepam (ATIVAN) 0.5 MG tablet   Oral   Take 1 tablet (0.5 mg total) by mouth every 6 (six) hours as needed. For anxiety   30 tablet   0   . EXPIRED: metolazone (ZAROXOLYN) 5 MG tablet   Oral   Take 1 tablet (5 mg total) by mouth daily.   30  tablet   0   .  metoprolol tartrate (LOPRESSOR) 25 MG tablet   Oral   Take 1 tablet (25 mg total) by mouth 2 (two) times daily.   60 tablet   6   . mirtazapine (REMERON) 15 MG tablet   Oral   Take 1 tablet (15 mg total) by mouth at bedtime.   30 tablet   5   . nitroGLYCERIN (NITROSTAT) 0.4 MG SL tablet   Sublingual   Place 0.4 mg under the tongue every 5 (five) minutes as needed. May repeat for 2 doses as needed for chest pain.         Marland Kitchen omeprazole (PRILOSEC) 40 MG capsule   Oral   Take 40 mg by mouth daily. For GI bleed.         . potassium chloride (KLOR-CON 10) 10 MEQ tablet   Oral   Take 1 tablet (10 mEq total) by mouth daily.   30 tablet   5   . promethazine (PHENERGAN) 12.5 MG tablet      Take one tablet by mouth every six hours as needed for nausea, ok to crush   50 tablet   2   . saccharomyces boulardii (FLORASTOR) 250 MG capsule   Oral   Take 250 mg by mouth 2 (two) times daily.         Marland Kitchen senna (SENOKOT) 8.6 MG tablet   Oral   Take 1 tablet by mouth daily.         . simvastatin (ZOCOR) 20 MG tablet   Oral   Take 1 tablet (20 mg total) by mouth every evening.   30 tablet   6   . temazepam (RESTORIL) 7.5 MG capsule   Oral   Take 1 capsule (7.5 mg total) by mouth at bedtime as needed.   30 capsule   0   . torsemide (DEMADEX) 20 MG tablet   Oral   Take 20 mg by mouth daily.           BP 192/64  Pulse 88  Temp(Src) 99 F (37.2 C) (Oral)  Resp 20  SpO2 97%  Physical Exam  Nursing note and vitals reviewed. Constitutional: She appears well-developed and well-nourished.  HENT:  Head: Normocephalic and atraumatic.  Right Ear: External ear normal.  Left Ear: External ear normal.  Nose: Nose normal.  Mouth/Throat: Oropharynx is clear and moist. No oropharyngeal exudate.  Eyes: Conjunctivae are normal.  Neck: Normal range of motion. Neck supple.  Cardiovascular: Normal rate, regular rhythm, normal heart sounds and intact distal  pulses.  Exam reveals no gallop and no friction rub.   No murmur heard. Pulmonary/Chest: No respiratory distress. She has no wheezes. She has no rales. She exhibits tenderness (overlying left chest at site of pain).  Well-healed scar overlying anterior chest   Abdominal: Soft. Bowel sounds are normal. She exhibits no distension and no mass. There is no tenderness. There is no rebound and no guarding.  Musculoskeletal: Normal range of motion. She exhibits no edema and no tenderness.  Neurological: She is alert. No cranial nerve deficit. She exhibits normal muscle tone.  Oriented x 1 (person)   Skin: Skin is warm and dry.  Psychiatric: She has a normal mood and affect. Her behavior is normal. Judgment and thought content normal.    ED Course  Procedures (including critical care time)  Labs Reviewed  CBC WITH DIFFERENTIAL - Abnormal; Notable for the following:    RBC 3.36 (*)    Hemoglobin 10.0 (*)    HCT  32.4 (*)    Platelets 139 (*)    All other components within normal limits  BASIC METABOLIC PANEL - Abnormal; Notable for the following:    Potassium 3.4 (*)    Chloride 86 (*)    CO2 >45 (*)    Glucose, Bld 102 (*)    BUN 31 (*)    Calcium 8.0 (*)    GFR calc non Af Amer 62 (*)    GFR calc Af Amer 72 (*)    All other components within normal limits  POCT I-STAT TROPONIN I  POCT I-STAT TROPONIN I   Dg Chest 2 View  07/18/2012  *RADIOLOGY REPORT*  Clinical Data: Chest pain, shortness of breath for 1 day, history hypertension, coronary disease post MI and CABG, diabetes, heart failure, prior pulmonary embolism, COPD, CHF  CHEST - 2 VIEW  Comparison: 09/12/2011  Findings: Enlargement of cardiac silhouette post CABG. Calcified tortuous thoracic aorta. Pulmonary vascular congestion. Bibasilar pleural effusions. Atelectasis right base. Question mild right perihilar edema. Underlying emphysematous changes. No pneumothorax. Diffuse osseous demineralization with old appearing thoracic  compression fractures.  IMPRESSION: Enlargement of cardiac silhouette post CABG with pulmonary vascular congestion question minimal perihilar edema. Small bibasilar pleural effusions and minimal right basilar atelectasis.   Original Report Authenticated By: Ulyses Southward, M.D.      Date: 07/18/2012  Rate: 92 bpm  Rhythm: atrial fibrillation  QRS Axis: left  Intervals: prolonged QTc (485 ms)  ST/T Wave abnormalities: nonspecific ST changes  Conduction Disutrbances:none  Narrative Interpretation: Atrial fibrillation without evidence of acute ischemia  Old EKG Reviewed: unchanged    1. Chest pain   2. CHF (congestive heart failure)     MDM  79 yo F w/hx of CAD (s/p CABG; most recent cath 2009), COPD (home hospice care on 2L/min [place]), and atrial fibrillation presents from nursing home for intermittent left-sided chest pain of 2 days; worsened today. No associated symptoms. Reproducible to palpation. Received ASA 324mg  en route via EMS. EKG reveals atrial fibrillation (92 bpm) without evidence of acute ischemia; unchanged from previous EKG. CXR negative for evidence of rib fracture or PTX. No widening of mediastinum. Serial troponins negative. Considering pain has been constant for 2 days and is reproducible to palpation; doubt ACS. I discussed whether pt would want a cardiac cath in her state of health and both patient and her son state that she would not want that, even if she had testing that was concerning for ACS. Patient given return precautions, including worsening of signs or symptoms.        Clemetine Marker, MD 07/18/12 [phone]

## 2012-07-18 NOTE — ED Notes (Signed)
Donna Reese-[phone]

## 2012-07-18 NOTE — ED Notes (Signed)
Pt to department via EMS from [facility]- reports some intermittent chest pain over the past couple of days. States this morning the pain was worse. Increased pain with palpation. Pt received 3 SL nitro and 324 asa PTA. Bp-182/78 Hr-80 a-fib. 22g left forearm.

## 2012-07-21 NOTE — ED Provider Notes (Signed)
I saw and evaluated the patient, reviewed the resident's note and I agree with the findings and plan. Pt w hx copd, home hospice care, cad, c/o vague sense chest tightness in past day. Cxr. Labs. Chest cta. +chest wall tenderness reproducing symptoms.   Suzi Roots, MD 07/21/12 [phone]

## 2012-08-07 ENCOUNTER — Other Ambulatory Visit: Payer: Self-pay | Admitting: *Deleted

## 2012-09-07 ENCOUNTER — Other Ambulatory Visit: Payer: Self-pay | Admitting: *Deleted

## 2012-10-08 ENCOUNTER — Other Ambulatory Visit: Payer: Self-pay | Admitting: Geriatric Medicine

## 2012-10-08 MED ORDER — OXYCODONE HCL ER 10 MG PO T12A: 10.0000 mg | EXTENDED_RELEASE_TABLET | Freq: Two times a day (BID) | ORAL | Status: DC

## 2012-11-04 ENCOUNTER — Encounter: Payer: Self-pay | Admitting: Adult Health

## 2012-11-04 ENCOUNTER — Non-Acute Institutional Stay (SKILLED_NURSING_FACILITY): Payer: Medicare Other | Admitting: Adult Health

## 2012-11-04 DIAGNOSIS — K5900 Constipation, unspecified: Secondary | ICD-10-CM | POA: Insufficient documentation

## 2012-11-04 DIAGNOSIS — I4891 Unspecified atrial fibrillation: Secondary | ICD-10-CM

## 2012-11-04 DIAGNOSIS — I5032 Chronic diastolic (congestive) heart failure: Secondary | ICD-10-CM

## 2012-11-04 DIAGNOSIS — H409 Unspecified glaucoma: Secondary | ICD-10-CM

## 2012-11-04 DIAGNOSIS — F329 Major depressive disorder, single episode, unspecified: Secondary | ICD-10-CM

## 2012-11-04 DIAGNOSIS — IMO0002 Reserved for concepts with insufficient information to code with codable children: Secondary | ICD-10-CM

## 2012-11-04 DIAGNOSIS — K219 Gastro-esophageal reflux disease without esophagitis: Secondary | ICD-10-CM

## 2012-11-04 DIAGNOSIS — J449 Chronic obstructive pulmonary disease, unspecified: Secondary | ICD-10-CM

## 2012-11-04 HISTORY — DX: Gastro-esophageal reflux disease without esophagitis: K21.9

## 2012-11-04 NOTE — Assessment & Plan Note (Signed)
Will continue prilosec 40 mg daily

## 2012-11-04 NOTE — Assessment & Plan Note (Signed)
She is O2 dependent; will continue pulmicort neb treatment twice daily has roxanol 5 mg every 4 hours as needed for pain or distress.

## 2012-11-04 NOTE — Progress Notes (Signed)
Patient ID: Donna Reese, female   DOB: Aug 22, 1931, 79 y.o.   MRN: [id number]  [facility]  Allergies  Allergen Reactions  . Penicillins Shortness Of Breath and Rash  . Niacin     REACTION: rash  . Sulfonamide Derivatives Other (See Comments)    Dry mouth  . Codeine Rash  . Meperidine Hcl Rash  . Morphine Rash  . Propoxyphene Hcl Rash     Chief Complaint  Patient presents with  . Medical Managment of Chronic Issues    HPI:  She is being seen for the management of her chronic illnesses. She is followed by hospice care. She is complaining of fatigue, weakness and palpations. There are no concerns being voiced by the nursing staff at this time.   Past Medical History  Diagnosis Date  . Diabetes mellitus   . Diverticulosis   . Osteopenia   . PUD (peptic ulcer disease)   . Peripheral arterial disease   . Hypertension   . Hyperlipidemia   . Diastolic heart failure     a. echo 9/11: EF 60-65%, mild LVH, grade 2 diast dysfxn, AV mean gradient 9 mmHg, mild MR, RVE, RVSP 59  . Coronary artery disease     a. s/p CABG;  b. cath 8/09: LM occluded; RCA 50%; L-LAD ok, Radial-OM ok; bilat renal art stenosis  . Renovascular hypertension   . Renal artery stenosis     a. s/p L RA stent 8/09  . Paroxysmal a-fib     no coumadin 2/2 GI Blee  . DVT (deep venous thrombosis)   . Pulmonary embolus     s/p IVC filter  . History of gastrointestinal bleeding     no ASA or coumadin  . Contrast dye induced nephropathy   . Orthostatic hypotension   . CHF (congestive heart failure)   . COPD (chronic obstructive pulmonary disease)   . Mesenteric ischemia, chronic   . PONV (postoperative nausea and vomiting)   . Myocardial infarction   . Angina   . Dysrhythmia   . Heart murmur   . Asthma   . Shortness of breath   . Recurrent upper respiratory infection (URI)   . Pneumonia   . Anemia   . Blood transfusion   . H/O hiatal hernia   . Headache(784.0)   . Arthritis   . Mental disorder    . Anxiety   . Depression   . GERD (gastroesophageal reflux disease) 11/04/2012    Past Surgical History  Procedure Laterality Date  . Appendectomy    . Cholecystectomy    . Abdominal hysterectomy    . Coronary artery bypass graft  09-04-2001    carotid u/s unchnaged 40-50% ICA stenosis 12/1998  . Cataract extraction, bilateral  2001  . Abdominal aortic aneurysm repair  1997  . Nm myoview ltd      neg EF 76%--06/26/2004  . Cardiac catheterization  09-03-01  . Esophagogastroduodenoscopy      abnormal 2007, esophageal stricture 10-12-03-07 gastritis and major bleef  . Vena cava filter placement  07-2007    Dr. Excell Seltzer  . Renal artery doppler  11-17-2008    severe iliac stenosis  . Tonsillectomy    . Breast surgery    . Hernia repair      VITAL SIGNS BP 126/54  Pulse 63  Ht 5\' 6"  (1.676 m)  Wt 117 lb 12.8 oz (53.434 kg)  BMI 19.02 kg/m2   Patient's Medications  New Prescriptions   No medications on file  Previous Medications   ACETAMINOPHEN (TYLENOL) 500 MG TABLET    Take 1,000 mg by mouth 2 (two) times daily as needed. Pain.   ALLOPURINOL (ZYLOPRIM) 300 MG TABLET    Take 300 mg by mouth daily.   CITALOPRAM (CELEXA) 20 MG TABLET    Take 10 mg by mouth daily.    LATANOPROST (XALATAN) 0.005 % OPHTHALMIC SOLUTION    Place 1 drop into both eyes at bedtime.    METOLAZONE (ZAROXOLYN) 5 MG TABLET    Take 5 mg by mouth daily.   METOPROLOL TARTRATE (LOPRESSOR) 25 MG TABLET    Take 1 tablet (25 mg total) by mouth 2 (two) times daily.   MIRTAZAPINE (REMERON) 15 MG TABLET    Take 1 tablet (15 mg total) by mouth at bedtime.   MORPHINE (ROXANOL) 20 MG/ML CONCENTRATED SOLUTION    Take 5 mg by mouth every 4 (four) hours as needed for pain.   NITROGLYCERIN (NITROSTAT) 0.4 MG SL TABLET    Place 0.4 mg under the tongue every 5 (five) minutes as needed for chest pain. x3 doses as needed for chest pain   OMEPRAZOLE (PRILOSEC) 40 MG CAPSULE    Take 40 mg by mouth daily.    OXYCODONE (OXYCONTIN) 10 MG  T12A    Take 1 tablet (10 mg total) by mouth every 12 (twelve) hours.   SENNOSIDES-DOCUSATE SODIUM (SENOKOT-S) 8.6-50 MG TABLET    Take 1 tablet by mouth 2 (two) times daily.   TORSEMIDE (DEMADEX) 20 MG TABLET    Take 60 mg by mouth daily.   Modified Medications   Modified Medication Previous Medication   BUDESONIDE (PULMICORT) 0.25 MG/2ML NEBULIZER SOLUTION budesonide (PULMICORT) 0.25 MG/2ML nebulizer solution      Take 0.25 mg by nebulization 2 (two) times daily.    Take 2 mLs (0.25 mg total) by nebulization 2 (two) times daily.   ISOSORBIDE MONONITRATE (IMDUR) 30 MG 24 HR TABLET isosorbide mononitrate (IMDUR) 30 MG 24 hr tablet      Take 60 mg by mouth daily.    Take 1 tablet (30 mg total) by mouth daily.   LORAZEPAM (ATIVAN) 0.5 MG TABLET LORazepam (ATIVAN) 0.5 MG tablet      Take 0.5 mg by mouth every 8 (eight) hours as needed. For anxiety    Take 1 tablet (0.5 mg total) by mouth every 6 (six) hours as needed. For anxiety   POTASSIUM CHLORIDE (K-DUR) 10 MEQ TABLET potassium chloride (KLOR-CON 10) 10 MEQ tablet      Take 20 mEq by mouth daily.    Take 1 tablet (10 mEq total) by mouth daily.  Discontinued Medications   ALBUTEROL (PROVENTIL HFA;VENTOLIN HFA) 108 (90 BASE) MCG/ACT INHALER    Inhale 2 puffs into the lungs every 6 (six) hours as needed. Shortness of breath   BUDESONIDE (PULMICORT) 0.25 MG/2ML NEBULIZER SOLUTION    Take 0.25 mg by nebulization daily as needed. FOR ASTHMA   CALCIUM CARBONATE (TUMS - DOSED IN MG ELEMENTAL CALCIUM) 500 MG CHEWABLE TABLET    Chew 1 tablet by mouth every 4 (four) hours as needed. indigestion   SIMVASTATIN (ZOCOR) 20 MG TABLET    Take 1 tablet (20 mg total) by mouth every evening.   TEMAZEPAM (RESTORIL) 7.5 MG CAPSULE    Take 1 capsule (7.5 mg total) by mouth at bedtime as needed.   TEMAZEPAM (RESTORIL) 7.5 MG CAPSULE    Take 15 mg by mouth at bedtime as needed.    SIGNIFICANT DIAGNOSTIC EXAMS  LABS REVIEWED  06-18-12: wbc 3.6; hgb 9.1; hct 28.5;  mcv 93.4; plt 97; glucose 86; bun 38; creat 0.96; k+3.6; na++146 Alk phos 147; albumin 2.5; chol 108; ldl 50; trig 85    Review of Systems  Constitutional: Positive for malaise/fatigue.  Respiratory: Negative for cough, shortness of breath and wheezing.   Cardiovascular: Positive for palpitations. Negative for chest pain and leg swelling.  Gastrointestinal: Negative for heartburn and constipation.  Musculoskeletal: Negative for myalgias and back pain.  Skin: Negative.   Neurological: Positive for weakness. Negative for headaches.  Psychiatric/Behavioral: Negative for depression. The patient does not have insomnia.     Physical Exam  Constitutional: She is oriented to person, place, and time.  frail  Neck: Neck supple. No JVD present. No thyromegaly present.  Cardiovascular: Intact distal pulses.   Heart rate irregular  Respiratory: Effort normal. No respiratory distress. She has no wheezes.  Breath sounds diminished  GI: Soft. Bowel sounds are normal. She exhibits no distension. There is no tenderness.  Musculoskeletal: She exhibits no edema.  Is presently in bed; is able to move all extremities  Neurological: She is alert and oriented to person, place, and time.  Skin: Skin is warm and dry.      ASSESSMENT/ PLAN:  Atrial fibrillation She is complaining of palpitations. Her blood pressure diastolic is low at 54; will continue lopressor 25 mg twice daily; she is not a candidate for anticoagulation therapy. At this time she is not a good candidate for a calcium channel blocker at this time. She continues to be followed by hospice care will monitor her status   Chronic diastolic heart failure She is declining; has increased fatigue present; and feels weak all the time.  Will continue her imdur 60 mg daily; Demadex 60 mg daily with k+ 20 meq daily  will stop the zaroxolyn at this time; in one week will check a bmp. Will monitor her status for increased edema.   COPD She is O2  dependent; will continue pulmicort neb treatment twice daily has roxanol 5 mg every 4 hours as needed for pain or distress.   DEGENERATIVE DISC DISEASE Her pain is managed at this time will continue her current plan which includes oxycontin 10 mg twice daily and has roxanol 5 mg every 4 hours as needed for pain or distress.   GERD (gastroesophageal reflux disease) Will continue prilosec 40 mg daily   Depression She is stable her celexa dose was lowered to 10 mg in July.  Will continue ativan 0.5 mg tid prn; she is on remeron 15 mg nightly for appetite which was started about one year ago; will stop this medication.   Unspecified constipation Will continue senna s twice daily will monitor   Glaucoma Will continue xalatan to both eyes nightly    Time spent with patient: 50 minutes

## 2012-11-04 NOTE — Assessment & Plan Note (Signed)
Her pain is managed at this time will continue her current plan which includes oxycontin 10 mg twice daily and has roxanol 5 mg every 4 hours as needed for pain or distress.

## 2012-11-04 NOTE — Assessment & Plan Note (Signed)
She is declining; has increased fatigue present; and feels weak all the time.  Will continue her imdur 60 mg daily; Demadex 60 mg daily with k+ 20 meq daily  will stop the zaroxolyn at this time; in one week will check a bmp. Will monitor her status for increased edema.

## 2012-11-04 NOTE — Assessment & Plan Note (Signed)
Will continue senna s twice daily will monitor

## 2012-11-04 NOTE — Assessment & Plan Note (Signed)
Will continue xalatan to both eyes nightly  

## 2012-11-04 NOTE — Assessment & Plan Note (Signed)
She is stable her celexa dose was lowered to 10 mg in July.  Will continue ativan 0.5 mg tid prn; she is on remeron 15 mg nightly for appetite which was started about one year ago; will stop this medication.

## 2012-11-04 NOTE — Assessment & Plan Note (Addendum)
She is complaining of palpitations. Her blood pressure diastolic is low at 54; will continue lopressor 25 mg twice daily; she is not a candidate for anticoagulation therapy. At this time she is not a good candidate for a calcium channel blocker at this time. She continues to be followed by hospice care will monitor her status

## 2012-11-09 ENCOUNTER — Other Ambulatory Visit: Payer: Self-pay | Admitting: Geriatric Medicine

## 2012-11-09 MED ORDER — OXYCODONE HCL ER 10 MG PO T12A: 10.0000 mg | EXTENDED_RELEASE_TABLET | Freq: Two times a day (BID) | ORAL | Status: DC

## 2012-12-08 ENCOUNTER — Other Ambulatory Visit: Payer: Self-pay | Admitting: *Deleted

## 2012-12-08 MED ORDER — OXYCODONE HCL ER 10 MG PO T12A: 10.0000 mg | EXTENDED_RELEASE_TABLET | Freq: Two times a day (BID) | ORAL | Status: DC

## 2012-12-23 ENCOUNTER — Other Ambulatory Visit: Payer: Self-pay | Admitting: *Deleted

## 2012-12-23 MED ORDER — MORPHINE SULFATE (CONCENTRATE) 20 MG/ML PO SOLN: 5.0000 mg | ORAL | Status: DC | PRN

## 2012-12-28 ENCOUNTER — Other Ambulatory Visit: Payer: Self-pay | Admitting: *Deleted

## 2012-12-28 MED ORDER — MORPHINE SULFATE (CONCENTRATE) 20 MG/ML PO SOLN: 5.0000 mg | ORAL | Status: DC | PRN

## 2012-12-29 ENCOUNTER — Other Ambulatory Visit: Payer: Self-pay | Admitting: *Deleted

## 2012-12-29 MED ORDER — MORPHINE SULFATE (CONCENTRATE) 20 MG/ML PO SOLN: 20.0000 mg | ORAL | Status: DC | PRN

## 2013-01-01 ENCOUNTER — Other Ambulatory Visit: Payer: Self-pay | Admitting: *Deleted

## 2013-01-07 ENCOUNTER — Other Ambulatory Visit: Payer: Self-pay | Admitting: *Deleted

## 2013-01-07 MED ORDER — OXYCODONE HCL ER 10 MG PO T12A: EXTENDED_RELEASE_TABLET | ORAL | Status: DC

## 2013-01-07 MED ORDER — OXYCODONE HCL ER 10 MG PO T12A: 10.0000 mg | EXTENDED_RELEASE_TABLET | Freq: Two times a day (BID) | ORAL | Status: DC

## 2013-02-08 ENCOUNTER — Other Ambulatory Visit: Payer: Self-pay | Admitting: *Deleted

## 2013-02-08 MED ORDER — OXYCODONE HCL ER 10 MG PO T12A: EXTENDED_RELEASE_TABLET | ORAL | Status: DC

## 2013-02-10 ENCOUNTER — Other Ambulatory Visit: Payer: Self-pay | Admitting: *Deleted

## 2013-02-10 MED ORDER — LORAZEPAM 0.5 MG PO TABS: ORAL_TABLET | ORAL | Status: DC

## 2013-03-01 ENCOUNTER — Other Ambulatory Visit: Payer: Self-pay | Admitting: *Deleted

## 2013-03-01 MED ORDER — LORAZEPAM 0.5 MG PO TABS: ORAL_TABLET | ORAL | Status: DC

## 2013-03-09 ENCOUNTER — Other Ambulatory Visit: Payer: Self-pay | Admitting: *Deleted

## 2013-03-09 MED ORDER — OXYCODONE HCL ER 10 MG PO T12A: EXTENDED_RELEASE_TABLET | ORAL | Status: DC

## 2013-03-15 ENCOUNTER — Non-Acute Institutional Stay (SKILLED_NURSING_FACILITY): Payer: Medicare Other | Admitting: Adult Health

## 2013-03-15 ENCOUNTER — Encounter: Payer: Self-pay | Admitting: Adult Health

## 2013-03-15 DIAGNOSIS — H409 Unspecified glaucoma: Secondary | ICD-10-CM

## 2013-03-15 DIAGNOSIS — I5032 Chronic diastolic (congestive) heart failure: Secondary | ICD-10-CM

## 2013-03-15 DIAGNOSIS — IMO0002 Reserved for concepts with insufficient information to code with codable children: Secondary | ICD-10-CM

## 2013-03-15 DIAGNOSIS — F329 Major depressive disorder, single episode, unspecified: Secondary | ICD-10-CM

## 2013-03-15 DIAGNOSIS — M109 Gout, unspecified: Secondary | ICD-10-CM

## 2013-03-15 DIAGNOSIS — I4891 Unspecified atrial fibrillation: Secondary | ICD-10-CM

## 2013-03-15 DIAGNOSIS — J449 Chronic obstructive pulmonary disease, unspecified: Secondary | ICD-10-CM

## 2013-03-15 NOTE — Progress Notes (Signed)
Patient ID: Donna Reese, female   DOB: 24-Jun-1931, 79 y.o.   MRN: [id number]     [facility]  Allergies  Allergen Reactions  . Penicillins Shortness Of Breath and Rash  . Niacin     REACTION: rash  . Sulfonamide Derivatives Other (See Comments)    Dry mouth  . Codeine Rash  . Meperidine Hcl Rash  . Morphine Rash  . Propoxyphene Hcl Rash     Chief Complaint  Patient presents with  . Medical Managment of Chronic Issues    HPI: She is being seen for the management of her chronic illnesses. She is followed by hospice care. Her family is concerned about her possibly being overly medicated as she is more confused; agitated; trying to get out of bed; is not sleeping at night at times. Her family wants her pain to be managed; but does not want these particular effects. I have spoken with the hospice nurse; will stop the oxycontin at this time; and will utilize her roxanol for her pain management as indicated in the future.   Past Medical History  Diagnosis Date  . Diabetes mellitus   . Diverticulosis   . Osteopenia   . PUD (peptic ulcer disease)   . Peripheral arterial disease   . Hypertension   . Hyperlipidemia   . Diastolic heart failure     a. echo 9/11: EF 60-65%, mild LVH, grade 2 diast dysfxn, AV mean gradient 9 mmHg, mild MR, RVE, RVSP 59  . Coronary artery disease     a. s/p CABG;  b. cath 8/09: LM occluded; RCA 50%; L-LAD ok, Radial-OM ok; bilat renal art stenosis  . Renovascular hypertension   . Renal artery stenosis     a. s/p L RA stent 8/09  . Paroxysmal a-fib     no coumadin 2/2 GI Blee  . DVT (deep venous thrombosis)   . Pulmonary embolus     s/p IVC filter  . History of gastrointestinal bleeding     no ASA or coumadin  . Contrast dye induced nephropathy   . Orthostatic hypotension   . CHF (congestive heart failure)   . COPD (chronic obstructive pulmonary disease)   . Mesenteric ischemia, chronic   . PONV (postoperative nausea and vomiting)   .  Myocardial infarction   . Angina   . Dysrhythmia   . Heart murmur   . Asthma   . Shortness of breath   . Recurrent upper respiratory infection (URI)   . Pneumonia   . Anemia   . Blood transfusion   . H/O hiatal hernia   . Headache(784.0)   . Arthritis   . Mental disorder   . Anxiety   . Depression   . GERD (gastroesophageal reflux disease) 11/04/2012    Past Surgical History  Procedure Laterality Date  . Appendectomy    . Cholecystectomy    . Abdominal hysterectomy    . Coronary artery bypass graft  09-04-2001    carotid u/s unchnaged 40-50% ICA stenosis 12/1998  . Cataract extraction, bilateral  2001  . Abdominal aortic aneurysm repair  1997  . Nm myoview ltd      neg EF 76%--06/26/2004  . Cardiac catheterization  09-03-01  . Esophagogastroduodenoscopy      abnormal 2007, esophageal stricture 10-12-03-07 gastritis and major bleef  . Vena cava filter placement  07-2007    Dr. Excell Seltzer  . Renal artery doppler  11-17-2008    severe iliac stenosis  . Tonsillectomy    .  Breast surgery    . Hernia repair      VITAL SIGNS BP 150/38  Pulse 92  Ht 5\' 6"  (1.676 m)  Wt 120 lb (54.432 kg)  BMI 19.38 kg/m2   Patient's Medications  New Prescriptions   No medications on file  Previous Medications   ACETAMINOPHEN (TYLENOL) 500 MG TABLET    Take 1,000 mg by mouth 2 (two) times daily as needed. Pain.   ALLOPURINOL (ZYLOPRIM) 300 MG TABLET    Take 300 mg by mouth daily.   BUDESONIDE (PULMICORT) 0.25 MG/2ML NEBULIZER SOLUTION    Take 0.25 mg by nebulization 2 (two) times daily.   CITALOPRAM (CELEXA) 20 MG TABLET    Take 10 mg by mouth daily.    IPRATROPIUM (ATROVENT) 0.02 % NEBULIZER SOLUTION    Take 0.5 mg by nebulization every 6 (six) hours as needed for wheezing or shortness of breath.   ISOSORBIDE MONONITRATE (IMDUR) 30 MG 24 HR TABLET    Take 60 mg by mouth daily.   LATANOPROST (XALATAN) 0.005 % OPHTHALMIC SOLUTION    Place 1 drop into both eyes at bedtime.    METOPROLOL TARTRATE  (LOPRESSOR) 25 MG TABLET    Take 1 tablet (25 mg total) by mouth 2 (two) times daily.   MORPHINE (ROXANOL) 20 MG/ML CONCENTRATED SOLUTION    Take 1 mL (20 mg total) by mouth every 4 (four) hours as needed for pain. Give 0.25mg  =5 mg by mouth/under tongue every eight hours *Double-check dose, use calibrated syringe.* Give 0.25mg =5 mg by mouth/under tongue every 4 hours as needed for shortness of breath/anxiety/agitation/chest pain.   NITROGLYCERIN (NITROSTAT) 0.4 MG SL TABLET    Place 0.4 mg under the tongue every 5 (five) minutes as needed for chest pain. x3 doses as needed for chest pain   OMEPRAZOLE (PRILOSEC) 40 MG CAPSULE    Take 20 mg by mouth daily.    OXYCODONE (OXYCONTIN) 10 MG T12A 12 HR TABLET    Take one tablet by mouth every 12 hours for pain. Do not crush   POTASSIUM CHLORIDE (K-DUR) 10 MEQ TABLET    Take 20 mEq by mouth daily.   SENNOSIDES-DOCUSATE SODIUM (SENOKOT-S) 8.6-50 MG TABLET    Take 1 tablet by mouth 2 (two) times daily.   TORSEMIDE (DEMADEX) 20 MG TABLET    Take 60 mg by mouth daily.   Modified Medications   Modified Medication Previous Medication   LORAZEPAM (ATIVAN) 0.5 MG TABLET LORazepam (ATIVAN) 0.5 MG tablet      Take 0.5 mg by mouth 2 (two) times daily. Take one tablet twice daily and daily as needed    Take one tablet by mouth every eight hours  Discontinued Medications   MORPHINE (ROXANOL) 20 MG/ML CONCENTRATED SOLUTION    Take 0.25 mLs (5 mg total) by mouth every 4 (four) hours as needed for pain.    SIGNIFICANT DIAGNOSTIC EXAMS   LABS REVIEWED  06-18-12: wbc 3.6; hgb 9.1; hct 28.5; mcv 93.4; plt 97; glucose 86; bun 38; creat 0.96; k+3.6; na++146 Alk phos 147; albumin 2.5; chol 108; ldl 50; trig 85    Review of Systems  Constitutional: negative for fatigue   Respiratory: Negative for cough, shortness of breath and wheezing.   Cardiovascular:  Negative for chest pain and leg swelling.  Gastrointestinal: Negative for heartburn and constipation.    Musculoskeletal: has pain "all over" Skin: Negative.   Neurological: . Negative for headaches.  Psychiatric/Behavioral: Negative for depression. The patient does not have insomnia.  Physical Exam  Constitutional: She is oriented to person, place, and time.  frail  Neck: Neck supple. No JVD present. No thyromegaly present.  Cardiovascular: Intact distal pulses.   Heart rate irregular  Respiratory: Effort normal. No respiratory distress. She has no wheezes.  Breath sounds diminished  GI: Soft. Bowel sounds are normal. She exhibits no distension. There is no tenderness.  Musculoskeletal: She exhibits no edema.  Is presently in bed; is able to move all extremities  Neurological: She is alert and oriented to person, place, and time.  Skin: Skin is warm and dry. she has a skin tear on her left lower arm and left lower leg     ASSESSMENT/ PLAN:  Atrial fibrillation She is complaining of palpitations. Her blood pressure diastolic is low at 38; will continue lopressor 25 mg twice daily for rate control she is not a candidate for anticoagulation therapy.   Chronic diastolic heart failure She is without change in status; will continue her demadex 60 mg daily with k+ 20 meq daily and will continue to monitor her status   COPD She is O2 dependent; will continue pulmicort neb treatment twice daily; atrovent neb treatment every 6 hours as needed; will monitor   DEGENERATIVE DISC DISEASE She states she is having pain "all over" her family is concerned about her level of confusion she is presently taking oxycontin 10 mg twice daily; will stop this medication at this time. Will continue her roxanol 5 mg every 8 hours and and every 4 hours as needed and will continue to monitor her status and will change her regimen as indicated.   GERD (gastroesophageal reflux disease) Will continue prilosec 20 mg daily   Depression She is stable on celexa 10 mg daily and ativan 0.5 mg twice daily and  daily as needed for anxiety will monitor   Unspecified constipation Will continue senna s twice daily will monitor   Glaucoma Will continue xalatan to both eyes nightly   Gout Will continue allopurinol 300 mg daily and will monitor

## 2013-04-05 ENCOUNTER — Non-Acute Institutional Stay (SKILLED_NURSING_FACILITY): Payer: Medicare Other | Admitting: Adult Health

## 2013-04-05 DIAGNOSIS — IMO0002 Reserved for concepts with insufficient information to code with codable children: Secondary | ICD-10-CM

## 2013-04-05 DIAGNOSIS — M109 Gout, unspecified: Secondary | ICD-10-CM

## 2013-04-05 DIAGNOSIS — G8929 Other chronic pain: Secondary | ICD-10-CM

## 2013-04-05 DIAGNOSIS — I5032 Chronic diastolic (congestive) heart failure: Secondary | ICD-10-CM

## 2013-04-05 DIAGNOSIS — I4891 Unspecified atrial fibrillation: Secondary | ICD-10-CM

## 2013-04-05 MED ORDER — MORPHINE SULFATE (CONCENTRATE) 20 MG/ML PO SOLN: 5.0000 mg | Freq: Four times a day (QID) | ORAL | Status: DC

## 2013-04-05 MED ORDER — MORPHINE SULFATE (CONCENTRATE) 20 MG/ML PO SOLN: 5.0000 mg | ORAL | Status: AC

## 2013-04-05 NOTE — Progress Notes (Signed)
Patient ID: Donna Reese, female   DOB: 1931/07/21, 80 y.o.   MRN: [id number]  Will change her roxanol to 5 mg every 4 hours routinely and every 2 hours as needed for breakthrough pain and will monitor her status

## 2013-04-05 NOTE — Addendum Note (Signed)
Addended by: Sharee HolsterGREEN, DEBORAH S on: 04/05/2013 10:35 AM   Modules accepted: Orders

## 2013-04-05 NOTE — Progress Notes (Signed)
Patient ID: Donna Reese, female   DOB: 1931-05-27, 80 y.o.   MRN: [id number]     [facility]  Allergies  Allergen Reactions  . Penicillins Shortness Of Breath and Rash  . Niacin     REACTION: rash  . Sulfonamide Derivatives Other (See Comments)    Dry mouth  . Codeine Rash  . Meperidine Hcl Rash  . Morphine Rash  . Propoxyphene Hcl Rash     Chief Complaint  Patient presents with  . Acute Visit    patient status     HPI:  She has had a decline in her status. She is not eating or drinking well. She has lost weight from 120 pounds to 105.4 pounds. She has been treated for an uti; which treatment has been completed.  and has developed a pressure ulcer on her sacrum unstaged with necrosis present.  She has been complaining of pain in her abdomen and generalized pain.    Past Medical History  Diagnosis Date  . Diabetes mellitus   . Diverticulosis   . Osteopenia   . PUD (peptic ulcer disease)   . Peripheral arterial disease   . Hypertension   . Hyperlipidemia   . Diastolic heart failure     a. echo 9/11: EF 60-65%, mild LVH, grade 2 diast dysfxn, AV mean gradient 9 mmHg, mild MR, RVE, RVSP 59  . Coronary artery disease     a. s/p CABG;  b. cath 8/09: LM occluded; RCA 50%; L-LAD ok, Radial-OM ok; bilat renal art stenosis  . Renovascular hypertension   . Renal artery stenosis     a. s/p L RA stent 8/09  . Paroxysmal a-fib     no coumadin 2/2 GI Blee  . DVT (deep venous thrombosis)   . Pulmonary embolus     s/p IVC filter  . History of gastrointestinal bleeding     no ASA or coumadin  . Contrast dye induced nephropathy   . Orthostatic hypotension   . CHF (congestive heart failure)   . COPD (chronic obstructive pulmonary disease)   . Mesenteric ischemia, chronic   . PONV (postoperative nausea and vomiting)   . Myocardial infarction   . Angina   . Dysrhythmia   . Heart murmur   . Asthma   . Shortness of breath   . Recurrent upper respiratory infection (URI)    . Pneumonia   . Anemia   . Blood transfusion   . H/O hiatal hernia   . Headache(784.0)   . Arthritis   . Mental disorder   . Anxiety   . Depression   . GERD (gastroesophageal reflux disease) 11/04/2012    Past Surgical History  Procedure Laterality Date  . Appendectomy    . Cholecystectomy    . Abdominal hysterectomy    . Coronary artery bypass graft  09-04-2001    carotid u/s unchnaged 40-50% ICA stenosis 12/1998  . Cataract extraction, bilateral  2001  . Abdominal aortic aneurysm repair  1997  . Nm myoview ltd      neg EF 76%--06/26/2004  . Cardiac catheterization  09-03-01  . Esophagogastroduodenoscopy      abnormal 2007, esophageal stricture 10-12-03-07 gastritis and major bleef  . Vena cava filter placement  07-2007    Dr. Burt Knack  . Renal artery doppler  11-17-2008    severe iliac stenosis  . Tonsillectomy    . Breast surgery    . Hernia repair      VITAL SIGNS BP 120/44  Pulse 88  Ht 5' 6"  (1.676 m)  Wt 105 lb 6.4 oz (47.809 kg)  BMI 17.02 kg/m2   Patient's Medications  New Prescriptions   No medications on file  Previous Medications   ACETAMINOPHEN (TYLENOL) 500 MG TABLET    Take 1,000 mg by mouth 2 (two) times daily as needed. Pain.   ALLOPURINOL (ZYLOPRIM) 300 MG TABLET    Take 300 mg by mouth daily.   BUDESONIDE (PULMICORT) 0.25 MG/2ML NEBULIZER SOLUTION    Take 0.25 mg by nebulization 2 (two) times daily.   CITALOPRAM (CELEXA) 20 MG TABLET    Take 10 mg by mouth daily.    IPRATROPIUM (ATROVENT) 0.02 % NEBULIZER SOLUTION    Take 0.5 mg by nebulization every 6 (six) hours as needed for wheezing or shortness of breath.   ISOSORBIDE MONONITRATE (IMDUR) 30 MG 24 HR TABLET    Take 60 mg by mouth daily.   LATANOPROST (XALATAN) 0.005 % OPHTHALMIC SOLUTION    Place 1 drop into both eyes at bedtime.    LORAZEPAM (ATIVAN) 0.5 MG TABLET    Take 0.5 mg by mouth 2 (two) times daily. Take one tablet twice daily and daily as needed   METOPROLOL TARTRATE (LOPRESSOR) 25 MG  TABLET    Take 1 tablet (25 mg total) by mouth 2 (two) times daily.   MORPHINE (ROXANOL) 20 MG/ML CONCENTRATED SOLUTION    Take 1 mL (20 mg total) by mouth every 4 (four) hours as needed for pain. Give 0.25m =5 mg by mouth/under tongue every eight hours *Double-check dose, use calibrated syringe.* Give 0.270m5 mg by mouth/under tongue every 4 hours as needed for shortness of breath/anxiety/agitation/chest pain.   NITROGLYCERIN (NITROSTAT) 0.4 MG SL TABLET    Place 0.4 mg under the tongue every 5 (five) minutes as needed for chest pain. x3 doses as needed for chest pain   OMEPRAZOLE (PRILOSEC) 40 MG CAPSULE    Take 40 mg by mouth daily.    POTASSIUM CHLORIDE (K-DUR) 10 MEQ TABLET    Take 20 mEq by mouth daily.   SENNOSIDES-DOCUSATE SODIUM (SENOKOT-S) 8.6-50 MG TABLET    Take 1 tablet by mouth 2 (two) times daily.   TORSEMIDE (DEMADEX) 20 MG TABLET    Take 60 mg by mouth daily.   Modified Medications   No medications on file  Discontinued Medications   OXYCODONE (OXYCONTIN) 10 MG T12A 12 HR TABLET    Take one tablet by mouth every 12 hours for pain. Do not crush    SIGNIFICANT DIAGNOSTIC EXAMS   LABS REVIEWED  06-18-12: wbc 3.6; hgb 9.1; hct 28.5; mcv 93.4; plt 97; glucose 86; bun 38; creat 0.96; k+3.6; na++146 Alk phos 147; albumin 2.5; chol 108; ldl 50; trig 85  03-24-13: urine culture: e-coli: macrobid    Review of Systems  Respiratory: Negative for cough and shortness of breath.   Cardiovascular: Negative for chest pain and palpitations.  Gastrointestinal: Positive for abdominal pain.  Musculoskeletal: Positive for myalgias.  Skin:       Has skin sore    Physical Exam  Constitutional: No distress.  cathectic   Neck: Neck supple. No JVD present.  Cardiovascular: Normal rate, regular rhythm and intact distal pulses.   Respiratory: Effort normal. No respiratory distress. She has no wheezes.  Breath sounds diminished   GI: Soft. She exhibits no distension. There is no  tenderness.  Bowel sounds hypoactive  Musculoskeletal: She exhibits no edema.  Is able to move extremities  Skin: Skin  is warm and dry. She is not diaphoretic.  Has unstaged sacral wound with scant drainage and foul odor there is necrotic area present. There is no inflammation present.      ASSESSMENT/ PLAN:  1. Afib: will stop her lopressor due to her low pressure readings and will monitor her status  2. Chronic heart failure: due to her poor po intake will stop her demedex and k+ and will monitor her status   3. Gout: will stop her allopurinol at this time and will monitor her for acute flare.  4. Degenerative disc disease and chronic pain will change her roxanol to 5 mg every 6 hours routinely and will keep her 5 mg every 4 hours as needed and will monitor her pain relief.   5. unstaged sacral decubitus: will use santyl and xeroform to wound daily and will monitor her status.

## 2013-04-07 ENCOUNTER — Non-Acute Institutional Stay (SKILLED_NURSING_FACILITY): Payer: Medicare Other | Admitting: Adult Health

## 2013-04-07 DIAGNOSIS — J449 Chronic obstructive pulmonary disease, unspecified: Secondary | ICD-10-CM

## 2013-04-07 DIAGNOSIS — I5022 Chronic systolic (congestive) heart failure: Secondary | ICD-10-CM

## 2013-04-07 DIAGNOSIS — R627 Adult failure to thrive: Secondary | ICD-10-CM

## 2013-04-12 ENCOUNTER — Other Ambulatory Visit: Payer: Self-pay | Admitting: *Deleted

## 2013-04-12 ENCOUNTER — Encounter: Payer: Self-pay | Admitting: Adult Health

## 2013-04-12 DIAGNOSIS — R627 Adult failure to thrive: Secondary | ICD-10-CM | POA: Insufficient documentation

## 2013-04-12 MED ORDER — LORAZEPAM 0.5 MG PO TABS: ORAL_TABLET | ORAL | Status: AC

## 2013-04-12 NOTE — Progress Notes (Signed)
Patient ID: Donna Reese, female   DOB: 26-Aug-1931, 80 y.o.   MRN: [id number]     [facility]  Allergies  Allergen Reactions  . Penicillins Shortness Of Breath and Rash  . Niacin     REACTION: rash  . Sulfonamide Derivatives Other (See Comments)    Dry mouth  . Codeine Rash  . Meperidine Hcl Rash  . Morphine Rash  . Propoxyphene Hcl Rash     Chief Complaint  Patient presents with  . Acute Visit    end of life issues     HPI: Her status has changed. She is not eating or drinking. She is less responsive; unable to participate in the hpi or ros. She is unable to take medications; she is taking roxanol 5 mg every 4 hours routinely and every 2 hours as needed for pain or distress. She continues to be followed by hospice care. Her family desires for her to comfortable.    Past Medical History  Diagnosis Date  . Diabetes mellitus   . Diverticulosis   . Osteopenia   . PUD (peptic ulcer disease)   . Peripheral arterial disease   . Hypertension   . Hyperlipidemia   . Diastolic heart failure     a. echo 9/11: EF 60-65%, mild LVH, grade 2 diast dysfxn, AV mean gradient 9 mmHg, mild MR, RVE, RVSP 59  . Coronary artery disease     a. s/p CABG;  b. cath 8/09: LM occluded; RCA 50%; L-LAD ok, Radial-OM ok; bilat renal art stenosis  . Renovascular hypertension   . Renal artery stenosis     a. s/p L RA stent 8/09  . Paroxysmal a-fib     no coumadin 2/2 GI Blee  . DVT (deep venous thrombosis)   . Pulmonary embolus     s/p IVC filter  . History of gastrointestinal bleeding     no ASA or coumadin  . Contrast dye induced nephropathy   . Orthostatic hypotension   . CHF (congestive heart failure)   . COPD (chronic obstructive pulmonary disease)   . Mesenteric ischemia, chronic   . PONV (postoperative nausea and vomiting)   . Myocardial infarction   . Angina   . Dysrhythmia   . Heart murmur   . Asthma   . Shortness of breath   . Recurrent upper respiratory infection (URI)     . Pneumonia   . Anemia   . Blood transfusion   . H/O hiatal hernia   . Headache(784.0)   . Arthritis   . Mental disorder   . Anxiety   . Depression   . GERD (gastroesophageal reflux disease) 11/04/2012    Past Surgical History  Procedure Laterality Date  . Appendectomy    . Cholecystectomy    . Abdominal hysterectomy    . Coronary artery bypass graft  09-04-2001    carotid u/s unchnaged 40-50% ICA stenosis 12/1998  . Cataract extraction, bilateral  2001  . Abdominal aortic aneurysm repair  1997  . Nm myoview ltd      neg EF 76%--06/26/2004  . Cardiac catheterization  09-03-01  . Esophagogastroduodenoscopy      abnormal 2007, esophageal stricture 10-12-03-07 gastritis and major bleef  . Vena cava filter placement  07-2007    Dr. Burt Knack  . Renal artery doppler  11-17-2008    severe iliac stenosis  . Tonsillectomy    . Breast surgery    . Hernia repair      VITAL SIGNS BP 137/69  Pulse 79  Ht 5' 6"  (1.676 m)  Wt 110 lb (49.896 kg)  BMI 17.76 kg/m2   Patient's Medications  New Prescriptions   No medications on file  Previous Medications   ACETAMINOPHEN (TYLENOL) 500 MG TABLET    Take 1,000 mg by mouth 2 (two) times daily as needed. Pain.   BUDESONIDE (PULMICORT) 0.25 MG/2ML NEBULIZER SOLUTION    Take 0.25 mg by nebulization 2 (two) times daily.   CITALOPRAM (CELEXA) 20 MG TABLET    Take 10 mg by mouth daily.    IPRATROPIUM (ATROVENT) 0.02 % NEBULIZER SOLUTION    Take 0.5 mg by nebulization every 6 (six) hours as needed for wheezing or shortness of breath.   ISOSORBIDE MONONITRATE (IMDUR) 30 MG 24 HR TABLET    Take 60 mg by mouth daily.   LATANOPROST (XALATAN) 0.005 % OPHTHALMIC SOLUTION    Place 1 drop into both eyes at bedtime.    LORAZEPAM (ATIVAN) 0.5 MG TABLET    Take one tablet by mouth every six hours for discomfort/agitation/restlessness   MORPHINE (ROXANOL) 20 MG/ML CONCENTRATED SOLUTION    Take 0.25 mLs (5 mg total) by mouth every 4 (four) hours. Take 5 mg every 4  hours routinely and every 2 hours as needed.   OMEPRAZOLE (PRILOSEC) 40 MG CAPSULE    Take 40 mg by mouth daily.    SENNOSIDES-DOCUSATE SODIUM (SENOKOT-S) 8.6-50 MG TABLET    Take 1 tablet by mouth 2 (two) times daily.  Modified Medications   No medications on file  Discontinued Medications   No medications on file    SIGNIFICANT DIAGNOSTIC EXAMS   LABS REVIEWED  06-18-12: wbc 3.6; hgb 9.1; hct 28.5; mcv 93.4; plt 97; glucose 86; bun 38; creat 0.96; k+3.6; na++146 Alk phos 147; albumin 2.5; chol 108; ldl 50; trig 85  03-24-13: urine culture: e-coli: macrobid   Review of Systems  Unable to perform ROS    Physical Exam  Constitutional: No distress.  cathectic   Neck: Neck supple. No JVD present.  Cardiovascular: Normal rate, regular rhythm and intact distal pulses.   Respiratory: Effort normal. No respiratory distress. She has no wheezes.  Breath sounds diminished   GI: Soft. She exhibits no distension. There is no tenderness.  Bowel sounds hypoactive  Musculoskeletal: She exhibits no edema.  Is able to move extremities  Skin: Skin is warm and dry. She is not diaphoretic.  Has unstaged sacral wound with scant drainage and foul odor there is necrotic area present. There is no inflammation present.     ASSESSMENT/ PLAN:  Chronic heart failure; copd; ftt:   Will stop there prilosec; imdur; celexa; pulmicort; xalatan; medpass and vitamins.   Will continue her ativan and roxanol; will continue to focus her care upon her comfort and will monitor her status   Time spent with patient 40 minutes.

## 2013-05-02 DEATH — deceased

## 2015-04-13 ENCOUNTER — Encounter: Payer: Self-pay | Admitting: Internal Medicine
